# Patient Record
Sex: Female | Born: 1945 | Race: White | Hispanic: No | Marital: Married | Smoking: Never smoker
Health system: Southern US, Community
[De-identification: ages and names within clinical notes are randomized; demographics above are authoritative.]

## PROBLEM LIST (undated history)

## (undated) DIAGNOSIS — R0600 Dyspnea, unspecified: Secondary | ICD-10-CM

## (undated) DIAGNOSIS — R112 Nausea with vomiting, unspecified: Secondary | ICD-10-CM

## (undated) DIAGNOSIS — K219 Gastro-esophageal reflux disease without esophagitis: Secondary | ICD-10-CM

## (undated) DIAGNOSIS — R053 Chronic cough: Secondary | ICD-10-CM

## (undated) DIAGNOSIS — M810 Age-related osteoporosis without current pathological fracture: Secondary | ICD-10-CM

## (undated) DIAGNOSIS — Z87440 Personal history of urinary (tract) infections: Secondary | ICD-10-CM

## (undated) DIAGNOSIS — K5790 Diverticulosis of intestine, part unspecified, without perforation or abscess without bleeding: Secondary | ICD-10-CM

## (undated) DIAGNOSIS — K225 Diverticulum of esophagus, acquired: Secondary | ICD-10-CM

## (undated) DIAGNOSIS — D649 Anemia, unspecified: Secondary | ICD-10-CM

## (undated) DIAGNOSIS — J449 Chronic obstructive pulmonary disease, unspecified: Secondary | ICD-10-CM

## (undated) DIAGNOSIS — M1990 Unspecified osteoarthritis, unspecified site: Secondary | ICD-10-CM

## (undated) DIAGNOSIS — R05 Cough: Secondary | ICD-10-CM

## (undated) DIAGNOSIS — Z9889 Other specified postprocedural states: Secondary | ICD-10-CM

## (undated) DIAGNOSIS — A310 Pulmonary mycobacterial infection: Secondary | ICD-10-CM

## (undated) DIAGNOSIS — Z8614 Personal history of Methicillin resistant Staphylococcus aureus infection: Secondary | ICD-10-CM

## (undated) DIAGNOSIS — M2550 Pain in unspecified joint: Secondary | ICD-10-CM

## (undated) DIAGNOSIS — J189 Pneumonia, unspecified organism: Secondary | ICD-10-CM

## (undated) HISTORY — DX: Cough: R05

## (undated) HISTORY — DX: Pulmonary mycobacterial infection: A31.0

## (undated) HISTORY — DX: Diverticulosis of intestine, part unspecified, without perforation or abscess without bleeding: K57.90

## (undated) HISTORY — DX: Diverticulum of esophagus, acquired: K22.5

## (undated) HISTORY — PX: ANTERIOR CERVICAL DECOMP/DISCECTOMY FUSION: SHX1161

## (undated) HISTORY — PX: BREAST BIOPSY: SHX20

## (undated) HISTORY — PX: COLONOSCOPY: SHX174

## (undated) HISTORY — PX: ESOPHAGOGASTRODUODENOSCOPY: SHX1529

## (undated) HISTORY — DX: Chronic cough: R05.3

---

## 1988-12-02 DIAGNOSIS — J189 Pneumonia, unspecified organism: Secondary | ICD-10-CM

## 1988-12-02 HISTORY — DX: Pneumonia, unspecified organism: J18.9

## 1997-08-12 ENCOUNTER — Other Ambulatory Visit: Admission: RE | Admit: 1997-08-12 | Discharge: 1997-08-12 | Payer: Self-pay | Admitting: Obstetrics & Gynecology

## 1998-11-04 ENCOUNTER — Other Ambulatory Visit: Admission: RE | Admit: 1998-11-04 | Discharge: 1998-11-04 | Payer: Self-pay | Admitting: Obstetrics & Gynecology

## 2000-01-09 ENCOUNTER — Other Ambulatory Visit: Admission: RE | Admit: 2000-01-09 | Discharge: 2000-01-09 | Payer: Self-pay | Admitting: Obstetrics & Gynecology

## 2001-02-13 ENCOUNTER — Other Ambulatory Visit: Admission: RE | Admit: 2001-02-13 | Discharge: 2001-02-13 | Payer: Self-pay | Admitting: Obstetrics & Gynecology

## 2002-02-21 ENCOUNTER — Other Ambulatory Visit: Admission: RE | Admit: 2002-02-21 | Discharge: 2002-02-21 | Payer: Self-pay | Admitting: Obstetrics & Gynecology

## 2003-03-13 ENCOUNTER — Other Ambulatory Visit: Admission: RE | Admit: 2003-03-13 | Discharge: 2003-03-13 | Payer: Self-pay | Admitting: Obstetrics & Gynecology

## 2004-01-11 ENCOUNTER — Ambulatory Visit (HOSPITAL_COMMUNITY): Admission: RE | Admit: 2004-01-11 | Discharge: 2004-01-11 | Payer: Self-pay | Admitting: Family Medicine

## 2004-03-16 ENCOUNTER — Other Ambulatory Visit: Admission: RE | Admit: 2004-03-16 | Discharge: 2004-03-16 | Payer: Self-pay | Admitting: Obstetrics & Gynecology

## 2004-11-16 ENCOUNTER — Encounter: Admission: RE | Admit: 2004-11-16 | Discharge: 2004-11-16 | Payer: Self-pay | Admitting: Allergy and Immunology

## 2004-12-02 ENCOUNTER — Ambulatory Visit: Payer: Self-pay | Admitting: Emergency Medicine

## 2004-12-16 ENCOUNTER — Ambulatory Visit (HOSPITAL_COMMUNITY): Admission: RE | Admit: 2004-12-16 | Discharge: 2004-12-16 | Payer: Self-pay | Admitting: Internal Medicine

## 2004-12-19 ENCOUNTER — Ambulatory Visit: Payer: Self-pay | Admitting: Internal Medicine

## 2004-12-23 ENCOUNTER — Ambulatory Visit: Payer: Self-pay | Admitting: Emergency Medicine

## 2004-12-26 ENCOUNTER — Ambulatory Visit: Payer: Self-pay | Admitting: Internal Medicine

## 2005-01-11 ENCOUNTER — Ambulatory Visit: Payer: Self-pay | Admitting: Emergency Medicine

## 2005-02-01 ENCOUNTER — Ambulatory Visit: Payer: Self-pay | Admitting: Internal Medicine

## 2005-02-20 ENCOUNTER — Ambulatory Visit: Payer: Self-pay | Admitting: Pulmonary Disease

## 2005-04-19 ENCOUNTER — Ambulatory Visit: Payer: Self-pay | Admitting: Emergency Medicine

## 2005-04-25 ENCOUNTER — Other Ambulatory Visit: Admission: RE | Admit: 2005-04-25 | Discharge: 2005-04-25 | Payer: Self-pay | Admitting: Obstetrics & Gynecology

## 2005-05-16 ENCOUNTER — Encounter: Admission: RE | Admit: 2005-05-16 | Discharge: 2005-05-16 | Payer: Self-pay | Admitting: Obstetrics & Gynecology

## 2005-05-16 ENCOUNTER — Encounter (INDEPENDENT_AMBULATORY_CARE_PROVIDER_SITE_OTHER): Payer: Self-pay | Admitting: Specialist

## 2005-06-14 ENCOUNTER — Ambulatory Visit: Payer: Self-pay | Admitting: Emergency Medicine

## 2005-07-13 ENCOUNTER — Ambulatory Visit: Payer: Self-pay | Admitting: Emergency Medicine

## 2005-07-26 ENCOUNTER — Ambulatory Visit: Payer: Self-pay | Admitting: Emergency Medicine

## 2005-09-07 ENCOUNTER — Ambulatory Visit: Payer: Self-pay | Admitting: Emergency Medicine

## 2005-10-31 ENCOUNTER — Ambulatory Visit: Payer: Self-pay | Admitting: Emergency Medicine

## 2005-11-02 ENCOUNTER — Ambulatory Visit: Payer: Self-pay | Admitting: Cardiology

## 2005-11-21 ENCOUNTER — Ambulatory Visit: Payer: Self-pay | Admitting: Emergency Medicine

## 2006-01-19 ENCOUNTER — Ambulatory Visit: Payer: Self-pay | Admitting: Pulmonary Disease

## 2006-02-02 ENCOUNTER — Encounter: Admission: RE | Admit: 2006-02-02 | Discharge: 2006-02-02 | Payer: Self-pay | Admitting: Obstetrics & Gynecology

## 2006-02-07 ENCOUNTER — Ambulatory Visit: Payer: Self-pay | Admitting: Pulmonary Disease

## 2006-02-16 ENCOUNTER — Ambulatory Visit: Payer: Self-pay | Admitting: Emergency Medicine

## 2006-05-14 ENCOUNTER — Ambulatory Visit: Payer: Self-pay | Admitting: Endocrinology

## 2006-05-28 ENCOUNTER — Ambulatory Visit: Payer: Self-pay | Admitting: Emergency Medicine

## 2006-06-06 ENCOUNTER — Encounter: Admission: RE | Admit: 2006-06-06 | Discharge: 2006-06-06 | Payer: Self-pay | Admitting: Obstetrics & Gynecology

## 2006-06-25 ENCOUNTER — Encounter: Admission: RE | Admit: 2006-06-25 | Discharge: 2006-06-25 | Payer: Self-pay | Admitting: Obstetrics & Gynecology

## 2006-07-24 ENCOUNTER — Ambulatory Visit: Payer: Self-pay | Admitting: Emergency Medicine

## 2006-07-27 ENCOUNTER — Encounter (INDEPENDENT_AMBULATORY_CARE_PROVIDER_SITE_OTHER): Payer: Self-pay | Admitting: *Deleted

## 2006-07-27 ENCOUNTER — Encounter: Admission: RE | Admit: 2006-07-27 | Discharge: 2006-07-27 | Payer: Self-pay | Admitting: Surgery

## 2006-07-27 ENCOUNTER — Ambulatory Visit (HOSPITAL_BASED_OUTPATIENT_CLINIC_OR_DEPARTMENT_OTHER): Admission: RE | Admit: 2006-07-27 | Discharge: 2006-07-27 | Payer: Self-pay | Admitting: Surgery

## 2006-08-02 ENCOUNTER — Ambulatory Visit: Payer: Self-pay | Admitting: Internal Medicine

## 2006-09-06 ENCOUNTER — Ambulatory Visit: Payer: Self-pay | Admitting: Internal Medicine

## 2006-10-12 ENCOUNTER — Ambulatory Visit: Payer: Self-pay | Admitting: Emergency Medicine

## 2006-10-15 ENCOUNTER — Encounter: Payer: Self-pay | Admitting: Emergency Medicine

## 2006-11-02 ENCOUNTER — Ambulatory Visit: Payer: Self-pay | Admitting: Emergency Medicine

## 2006-12-06 DIAGNOSIS — A310 Pulmonary mycobacterial infection: Secondary | ICD-10-CM | POA: Insufficient documentation

## 2006-12-06 DIAGNOSIS — R059 Cough, unspecified: Secondary | ICD-10-CM | POA: Insufficient documentation

## 2006-12-06 DIAGNOSIS — N951 Menopausal and female climacteric states: Secondary | ICD-10-CM | POA: Insufficient documentation

## 2006-12-06 DIAGNOSIS — R053 Chronic cough: Secondary | ICD-10-CM | POA: Insufficient documentation

## 2006-12-06 DIAGNOSIS — M810 Age-related osteoporosis without current pathological fracture: Secondary | ICD-10-CM | POA: Insufficient documentation

## 2006-12-06 DIAGNOSIS — J309 Allergic rhinitis, unspecified: Secondary | ICD-10-CM | POA: Insufficient documentation

## 2006-12-06 DIAGNOSIS — R05 Cough: Secondary | ICD-10-CM

## 2006-12-06 DIAGNOSIS — J479 Bronchiectasis, uncomplicated: Secondary | ICD-10-CM | POA: Insufficient documentation

## 2006-12-10 ENCOUNTER — Ambulatory Visit: Payer: Self-pay | Admitting: Emergency Medicine

## 2006-12-24 ENCOUNTER — Ambulatory Visit: Payer: Self-pay | Admitting: Cardiovascular Disease

## 2007-01-09 ENCOUNTER — Ambulatory Visit: Payer: Self-pay | Admitting: Emergency Medicine

## 2007-01-09 LAB — CONVERTED CEMR LAB: IgE (Immunoglobulin E), Serum: 5.2 intl units/mL (ref 0.0–180.0)

## 2007-02-20 ENCOUNTER — Ambulatory Visit: Payer: Self-pay | Admitting: Emergency Medicine

## 2007-03-18 ENCOUNTER — Telehealth: Payer: Self-pay | Admitting: Emergency Medicine

## 2007-04-08 ENCOUNTER — Ambulatory Visit: Payer: Self-pay | Admitting: Emergency Medicine

## 2007-04-08 ENCOUNTER — Ambulatory Visit: Admission: RE | Admit: 2007-04-08 | Discharge: 2007-04-08 | Payer: Self-pay | Admitting: Emergency Medicine

## 2007-04-08 ENCOUNTER — Encounter: Payer: Self-pay | Admitting: Emergency Medicine

## 2007-04-15 ENCOUNTER — Ambulatory Visit: Payer: Self-pay | Admitting: Emergency Medicine

## 2007-04-18 ENCOUNTER — Encounter: Payer: Self-pay | Admitting: Emergency Medicine

## 2007-04-24 ENCOUNTER — Telehealth (INDEPENDENT_AMBULATORY_CARE_PROVIDER_SITE_OTHER): Payer: Self-pay | Admitting: *Deleted

## 2007-04-24 ENCOUNTER — Encounter: Payer: Self-pay | Admitting: Emergency Medicine

## 2007-04-24 ENCOUNTER — Ambulatory Visit: Payer: Self-pay | Admitting: Pulmonary Disease

## 2007-04-24 DIAGNOSIS — Z882 Allergy status to sulfonamides status: Secondary | ICD-10-CM

## 2007-04-25 ENCOUNTER — Telehealth (INDEPENDENT_AMBULATORY_CARE_PROVIDER_SITE_OTHER): Payer: Self-pay | Admitting: *Deleted

## 2007-05-02 ENCOUNTER — Telehealth (INDEPENDENT_AMBULATORY_CARE_PROVIDER_SITE_OTHER): Payer: Self-pay | Admitting: *Deleted

## 2007-06-20 ENCOUNTER — Encounter: Admission: RE | Admit: 2007-06-20 | Discharge: 2007-06-20 | Payer: Self-pay | Admitting: Obstetrics & Gynecology

## 2007-07-01 ENCOUNTER — Ambulatory Visit: Payer: Self-pay | Admitting: Emergency Medicine

## 2007-08-13 ENCOUNTER — Ambulatory Visit: Payer: Self-pay | Admitting: Emergency Medicine

## 2007-12-06 ENCOUNTER — Ambulatory Visit: Payer: Self-pay | Admitting: Emergency Medicine

## 2007-12-30 ENCOUNTER — Telehealth (INDEPENDENT_AMBULATORY_CARE_PROVIDER_SITE_OTHER): Payer: Self-pay | Admitting: *Deleted

## 2007-12-30 ENCOUNTER — Encounter: Payer: Self-pay | Admitting: Emergency Medicine

## 2008-01-02 ENCOUNTER — Telehealth (INDEPENDENT_AMBULATORY_CARE_PROVIDER_SITE_OTHER): Payer: Self-pay | Admitting: *Deleted

## 2008-01-31 ENCOUNTER — Ambulatory Visit: Payer: Self-pay | Admitting: Emergency Medicine

## 2008-03-09 ENCOUNTER — Ambulatory Visit: Payer: Self-pay | Admitting: Emergency Medicine

## 2008-04-10 ENCOUNTER — Ambulatory Visit: Payer: Self-pay | Admitting: Emergency Medicine

## 2008-06-09 ENCOUNTER — Ambulatory Visit: Payer: Self-pay | Admitting: Emergency Medicine

## 2008-06-16 ENCOUNTER — Telehealth: Payer: Self-pay | Admitting: Emergency Medicine

## 2008-09-02 ENCOUNTER — Ambulatory Visit: Payer: Self-pay | Admitting: Emergency Medicine

## 2008-10-16 ENCOUNTER — Ambulatory Visit: Payer: Self-pay | Admitting: Emergency Medicine

## 2008-12-22 ENCOUNTER — Ambulatory Visit: Payer: Self-pay | Admitting: Emergency Medicine

## 2008-12-22 LAB — CONVERTED CEMR LAB
Cholesterol, target level: 200 mg/dL
HDL goal, serum: 40 mg/dL
LDL Goal: 160 mg/dL

## 2008-12-23 LAB — CONVERTED CEMR LAB
Bilirubin Urine: NEGATIVE
Nitrite: NEGATIVE
Specific Gravity, Urine: 1.02 (ref 1.000–1.030)
Total Protein, Urine: NEGATIVE mg/dL
Urine Glucose: NEGATIVE mg/dL
Urobilinogen, UA: 0.2 (ref 0.0–1.0)
pH: 6.5 (ref 5.0–8.0)

## 2009-01-08 ENCOUNTER — Encounter: Payer: Self-pay | Admitting: Emergency Medicine

## 2009-03-08 ENCOUNTER — Ambulatory Visit: Payer: Self-pay | Admitting: Emergency Medicine

## 2009-04-01 ENCOUNTER — Ambulatory Visit: Payer: Self-pay | Admitting: Internal Medicine

## 2009-04-01 LAB — CONVERTED CEMR LAB
Basophils Absolute: 0 10*3/uL (ref 0.0–0.1)
Basophils Relative: 0.6 % (ref 0.0–3.0)
Eosinophils Absolute: 0.1 10*3/uL (ref 0.0–0.7)
Eosinophils Relative: 1 % (ref 0.0–5.0)
HCT: 41.1 % (ref 36.0–46.0)
Hemoglobin: 14 g/dL (ref 12.0–15.0)
IgA: 448 mg/dL — ABNORMAL HIGH (ref 68–378)
IgE (Immunoglobulin E), Serum: 2.4 intl units/mL (ref 0.0–180.0)
IgG (Immunoglobin G), Serum: 909 mg/dL (ref 694–1618)
IgM, Serum: 76 mg/dL (ref 60–263)
Lymphocytes Relative: 34.8 % (ref 12.0–46.0)
Lymphs Abs: 2.3 10*3/uL (ref 0.7–4.0)
MCHC: 34 g/dL (ref 30.0–36.0)
MCV: 91.2 fL (ref 78.0–100.0)
Monocytes Absolute: 0.4 10*3/uL (ref 0.1–1.0)
Monocytes Relative: 5.5 % (ref 3.0–12.0)
Neutro Abs: 3.8 10*3/uL (ref 1.4–7.7)
Neutrophils Relative %: 58.1 % (ref 43.0–77.0)
Platelets: 245 10*3/uL (ref 150.0–400.0)
RBC: 4.51 M/uL (ref 3.87–5.11)
RDW: 12.2 % (ref 11.5–14.6)
WBC: 6.6 10*3/uL (ref 4.5–10.5)

## 2009-05-05 ENCOUNTER — Ambulatory Visit: Payer: Self-pay | Admitting: Internal Medicine

## 2009-07-12 ENCOUNTER — Ambulatory Visit: Payer: Self-pay | Admitting: Emergency Medicine

## 2009-08-03 ENCOUNTER — Ambulatory Visit: Payer: Self-pay | Admitting: Internal Medicine

## 2009-09-28 ENCOUNTER — Telehealth (INDEPENDENT_AMBULATORY_CARE_PROVIDER_SITE_OTHER): Payer: Self-pay | Admitting: *Deleted

## 2009-10-01 ENCOUNTER — Ambulatory Visit: Payer: Self-pay | Admitting: Emergency Medicine

## 2009-11-09 ENCOUNTER — Ambulatory Visit: Payer: Self-pay | Admitting: Internal Medicine

## 2010-01-24 ENCOUNTER — Ambulatory Visit: Payer: Self-pay | Admitting: Emergency Medicine

## 2010-02-07 ENCOUNTER — Ambulatory Visit: Payer: Self-pay | Admitting: Internal Medicine

## 2010-02-11 ENCOUNTER — Encounter: Payer: Self-pay | Admitting: Internal Medicine

## 2010-04-20 ENCOUNTER — Ambulatory Visit: Payer: Self-pay | Admitting: Internal Medicine

## 2010-04-21 ENCOUNTER — Ambulatory Visit: Payer: Self-pay | Admitting: Internal Medicine

## 2010-04-27 ENCOUNTER — Ambulatory Visit: Payer: Self-pay | Admitting: Internal Medicine

## 2010-04-28 ENCOUNTER — Ambulatory Visit: Payer: Self-pay | Admitting: Internal Medicine

## 2010-05-01 ENCOUNTER — Ambulatory Visit: Payer: Self-pay | Admitting: Internal Medicine

## 2010-05-02 ENCOUNTER — Ambulatory Visit: Payer: Self-pay | Admitting: Internal Medicine

## 2010-05-03 NOTE — Progress Notes (Signed)
Summary: talk to nurse  Phone Note Call from Patient Call back at Home Phone [phone]   Caller: Patient Call For: byrum Reason for Call: Talk to Nurse Summary of Call: Pt c/o of brown and greenish mucous x3 days, wants to be seen offered her TP she refused, pls advise.//[facility] Initial call taken by: Darletta Moll,  September 28, 2009 10:05 AM  Follow-up for Phone Call        called and spoke with pt and she stated that she has been sick x 3 days with cough with brown/green sputum   low grade fever on friday and saturday--she has violent couging spells at times---she is on the maintenance abx every other month with azithromycin and doxy---she started the azithromycin early--she is on her 3rd day today of this.  has 2 days left.  please advise.  thanks     Additional Follow-up for Phone Call Additional follow up Details #1::        Spoke to pt - has had URI symptoms. trying symptomatic relief. Will add Avelox, d/c the azithro. She will do NSWs, benadryl, add chlorphenerimine, told her we would try to fit her in next few days.  Additional Follow-up by: Leslye Peer MD,  September 28, 2009 2:03 PM    Additional Follow-up for Phone Call Additional follow up Details #2::    There is an opening on Fri 10/01/2009 @ 4:10pm or pt can come in tomorrow at 1:30pm. Okay to double book.Michel Bickers [facility]  September 28, 2009 3:11 PM  pt scheduled to see RB on Friday at 4:10pm.  Arman Filter LPN  September 28, 2009 3:16 PM   New/Updated Medications: AVELOX 400 MG TABS (MOXIFLOXACIN HCL) 1 by mouth once daily Prescriptions: AVELOX 400 MG TABS (MOXIFLOXACIN HCL) 1 by mouth once daily  #7 x 0   Entered and Authorized by:   Leslye Peer MD   Signed by:   Leslye Peer MD on 09/28/2009   Method used:   Electronically to        [facility]  [address] [address] [id number] (retail)       [address]       [place], [place]  [address]       Ph: [phone] or [phone]       Fax: [phone]   RxID:    [id number]

## 2010-05-03 NOTE — Miscellaneous (Signed)
Summary: Occupational psychologist   Imported By: Lester Haigler 05/12/2009 09:11:10  _____________________________________________________________________  External Attachment:    Type:   Image     Comment:   External Document

## 2010-05-03 NOTE — Assessment & Plan Note (Signed)
Summary: ALT- OK PER KATIE ///KP   Vital Signs:  Patient profile:   65 year old female Height:      65 inches Weight:      107.25 pounds BMI:     17.91 O2 Sat:      93 % on Room air Pulse rate:   89 / minute BP sitting:   108 / 64  (right arm) Cuff size:   regular  Vitals Entered By: Reynaldo Minium CMA (May 05, 2009 2:04 PM)  O2 Flow:  Room air CC: Allergy Testing.   Comments Medications reviewed with patient Phone number verified with patient. Reynaldo Minium CMA  May 05, 2009 2:04 PM    Copy to:  Dr. Levy Pupa Primary Provider/Referring Provider:  Dr. Lubertha South  CC:  Allergy Testing.  Marland Kitchen  History of Present Illness: CC:  Allergy consult-drainage and cough.. History of Present Illness: April 01, 2009- New referral for this 2 yoF at kind request of Dr Delton Coombes, asking allergy evaluation. She is treated for hx of bronchiectasis. She complains of recurrent sinus infections since her teens. Skin tested 2-3 years ago, "positive for dust". Bothersome postnasal drip is worst in Spring and with dust exposure.As she has gotten older, she has been more aware of a seasonal pattern, but the drainage is perennial. She denies hx of asthma, or family hx of sinopulmonary disease. Strong smells and perfumes make her nose run. There are no animal exposures or problems with foods, skin rashes, latex, contrast dye or aspirin.  May 05, 2009- Bronchiectasis, allergy evaluation Feels the same with no acute respiratory issue. She is on Keflex for a UTI. She is aware of sneeze and nasal drip without dramatic change off antihistamine for skin testing. RAST- neg IgA elevated 448 (68-378). IgG and IgM are WNL. IgE wnl- 2.4 ST- Positive common inhalants- grass/ weed/ tree/ dust-mite.  Current Medications (verified): 1)  One-A-Day Extras Antioxidant   Caps (Multiple Vitamins-Minerals) .... Take 1 Tablet By Mouth Once A Day 2)  Aspirin 81 Mg  Tbec (Aspirin) .... Take 1 Tablet By Mouth  Once A Day 3)  Fosamax 10 Mg Tabs (Alendronate Sodium) .Marland Kitchen.. 1 By Mouth Every Wk 4)  Prempro 0.3-1.5 Mg Tabs (Conj Estrog-Medroxyprogest Ace) .... Take 1 Tablet By Mouth Once A Day 5)  Benadryl 25 Mg Caps (Diphenhydramine Hcl) .... Two Times A Day 6)  Citracal Plus  Tabs (Multiple Minerals-Vitamins) .... Two Times A Day 7)  Doxycycline Hyclate 100 Mg Caps (Doxycycline Hyclate) .Marland Kitchen.. 1 By Mouth Two Times A Day For The First 14 Days of Alternating Months (Alternated With Azithromycin) 8)  Proventil Hfa 108 (90 Base) Mcg/act Aers (Albuterol Sulfate) .... 2 Puffs Every 4 Hours As Needed 9)  Azithromycin 250 Mg Tabs (Azithromycin) .Marland Kitchen.. 1 By Mouth Once Daily For The First 5 Days of Alternating Months. (Alternated With Doxycycline). 10)  Pro-B Supplement .... Once Daily 11)  Cephalexin 500 Mg Caps (Cephalexin) .... Three Times A Day  Allergies (verified): 1)  Bactrim  Past History:  Past Surgical History: Last updated: 04/01/2009 neck surgery 1997 left breast biopsy  Family History: Last updated: 04/01/2009 allergies-mother heart disease-mother, father  Social History: Last updated: 04/01/2009 Patient never smoked.  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.  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    Physical Exam  Additional Exam:  General: A/Ox3; pleasant and cooperative, NAD, trim SKIN: no rash, lesions NODES: no  lymphadenopathy HEENT: [place]/AT, EOM- WNL, Conjuctivae- clear, PERRLA, TM-WNL, Nose- clear, Throat- clear and wnl, [place]  II NECK: Supple w/ fair ROM, JVD- none, normal carotid impulses w/o bruits Thyroid-  CHEST: trace lower zone crackles, unlabored HEART: RRR, no m/g/r heard ABDOMEN: Soft and nl; EAV:WUJW, nl pulses, no edema  NEURO: Grossly intact to observation      Impression & Recommendations:  Problem # 1:  ALLERGIC RHINITIS (ICD-477.9)  Post  nasal drip is a significant complaint and this may correspond best to the atopy indicated by skin test pattern. She had been on several steroid nasal sprays including nasonex. She lives in [place] and plans to be out of country in March. We discussed risks, technique and management of allergy vaccine as an option that we can work on later if needed. I will let her tryAstepro sample first. Her updated medication list for this problem includes:    Benadryl 25 Mg Caps (Diphenhydramine hcl) .Marland Kitchen..Marland Kitchen Two times a day  Problem # 2:  BRONCHIECTASIS (ICD-494.0) Not an acute source of complaint at this visit. Followed by Dr Delton Coombes.  Medications Added to Medication List This Visit: 1)  Benadryl 25 Mg Caps (Diphenhydramine hcl) .... Two times a day 2)  Pro-b Supplement  .... Once daily 3)  Cephalexin 500 Mg Caps (Cephalexin) .... Three times a day  Other Orders: Est. Patient Level II (11914) Allergy Puncture Test (78295) Allergy I.D Test (62130)  Patient Instructions: 1)  Please schedule a follow-up appointment in 3 months. 2)  We can set up a way tpo get allergy shots if you decide that is something you want to do. 3)  Sample Astepro nasal antihistamine: 1-2 sprays each nostril up to twice daily if needed.   Immunization History:  Influenza Immunization History:    Influenza:  historical (01/01/2009)

## 2010-05-03 NOTE — Assessment & Plan Note (Signed)
Summary: rov 3 months///kp   Copy to:  Dr. Levy Pupa Primary Provider/Referring Provider:  Dr. Lubertha South  CC:  3 month follow up visit-allergies have been better since last visit-"better time of the year"..  History of Present Illness:  CC:  Allergy consult-drainage and cough.. History of Present Illness: April 01, 2009- New referral for this 64 yoF at kind request of Dr Delton Coombes, asking allergy evaluation. She is treated for hx of bronchiectasis. She complains of recurrent sinus infections since her teens. Skin tested 2-3 years ago, "positive for dust". Bothersome postnasal drip is worst in Spring and with dust exposure.As she has gotten older, she has been more aware of a seasonal pattern, but the drainage is perennial.  May 05, 2009- Bronchiectasis, allergy evaluation Feels the same with no acute respiratory issue. She is on Keflex for a UTI. She is aware of sneeze and nasal drip without dramatic change off antihistamine for skin testing. RAST- neg IgA elevated 448 (68-378). IgG and IgM are WNL. IgE wnl- 2.4 ST- Positive common inhalants- grass/ weed/ tree/ dust-mite.  November 09, 2009- Bronchiectasis, allergy evaluation She got Dr Delton Coombes to give her an antibiotic July, 1, then he had her go  back to alternating zithromax/ doxycycline to suppress bronchitis. She has coughed through the summer/ clear to brown. Always has annoying postnasal drip and variable nasal congestion. February 07, 2010- Bronchiectasis, allergy evaluation Nurse-CC: 3 month follow up visit-allergies have been better since last visit-"better time of the year". Fall is better than Spring. Daliresp kept her awake- dc'd. Trying rotating doxy/ azithromycin. Doxy best, still coughs. She feels ready to try allergy shots but needs to wait till she can travel routinely. Dr Gerda Diss can give maintenance but not buildup shots..     Preventive Screening-Counseling & Management  Alcohol-Tobacco     Smoking Status:  never  Current Medications (verified): 1)  One-A-Day Extras Antioxidant   Caps (Multiple Vitamins-Minerals) .... Take 1 Tablet By Mouth Once A Day 2)  Aspirin 81 Mg  Tbec (Aspirin) .... Take 1 Tablet By Mouth Once A Day 3)  Fosamax 10 Mg Tabs (Alendronate Sodium) .Marland Kitchen.. 1 By Mouth Every Wk 4)  Prempro 0.3-1.5 Mg Tabs (Conj Estrog-Medroxyprogest Ace) .... Take 1 Tablet By Mouth Once A Day 5)  Benadryl 25 Mg Caps (Diphenhydramine Hcl) .... Take As Directed As Needed 6)  Citracal Plus  Tabs (Multiple Minerals-Vitamins) .... Take 2 Tablet By Mouth Once A Day 7)  Doxycycline Hyclate 100 Mg Caps (Doxycycline Hyclate) .Marland Kitchen.. 1 By Mouth Two Times A Day For The First 10 Days of Alternating Months (Alternated With Azithromycin) 8)  Azithromycin 250 Mg Tabs (Azithromycin) .Marland Kitchen.. 1 By Mouth Once Daily For The First 10 Days of Alternating Months. (Alternated With Doxycycline). 9)  Pro-B Supplement .... Once Daily 10)  Cephalexin 500 Mg Caps (Cephalexin) .... Take 1 Tab By Mouth As Needed  Allergies: 1)  ! 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   Risk Factors: Smoking Status: never (  02/07/2010)  Review of Systems      See HPI       The patient complains of nasal congestion/difficulty breathing through nose.  The patient denies productive cough,  non-productive cough, coughing up blood, chest pain, irregular heartbeats, acid heartburn, indigestion, loss of appetite, weight change, abdominal pain, difficulty swallowing, sore throat, tooth/dental problems, headaches, and sneezing.    Vital Signs:  Patient profile:   65 year old female Height:      65 inches Weight:      107.50 pounds BMI:     17.95 O2 Sat:      96 % on Room air Pulse rate:   83 / minute BP sitting:   114 / 60  (left arm) Cuff size:   regular  Vitals Entered By: Reynaldo Minium CMA (February 07, 2010 2:11 PM)  O2 Flow:  Room air CC: 3 month follow up visit-allergies have been better since last visit-"better time of the year".   Physical Exam  Additional Exam:  General: A/Ox3; pleasant and cooperative, NAD, trim SKIN: no rash, lesions NODES: no lymphadenopathy HEENT: [place]/AT, EOM- WNL, Conjuctivae- clear, PERRLA, TM-WNL, Nose- very pale with abundant clear mucus, Throat- clear and wnl, [place]  II NECK: Supple w/ fair ROM, JVD- none, normal carotid impulses w/o bruits Thyroid-  CHEST: coarse sounds, but no wheeze and unlabored HEART: RRR, no m/g/r heard ABDOMEN: Soft and nl; UUV:OZDG, nl pulses, no edema  NEURO: Grossly intact to observation      Impression & Recommendations:  Problem # 1:  COUGH, CHRONIC (ICD-786.2)  We agreed to let her try allergy vaccine, but starting in January. Once through the build-up, she can get her maintenance shots at Dr Cathlyn Parsons.   Problem # 2:  ALLERGIC RHINITIS (ICD-477.9)  Benadryl every night. Says otc antihistamines don't help much for rhinorhea. We will try ipratropium nasal spray. She continues nasal wash.  Her updated medication list for this problem includes:    Benadryl 25 Mg Caps (Diphenhydramine hcl) .Marland Kitchen... Take as directed as needed    Ipratropium Bromide 0.03 % Soln (Ipratropium bromide) .Marland Kitchen... 1-2 sprays each nostril up to three times a day as needed  Medications Added to Medication List This Visit: 1)   Ipratropium Bromide 0.03 % Soln (Ipratropium bromide) .Marland Kitchen.. 1-2 sprays each nostril up to three times a day as needed 2)  Allergy Vaccine Start New Jan, 2012.  .... To get at dr Cathlyn Parsons  Other Orders: Est. Patient Level III (64403)  Patient Instructions: 1)  Please schedule a follow-up appointment in 4 months. 2)  We will set up the allergy lab to start allergy vaccine for you in January. Please call to help [place] get communication started if you don't hear directly from the allergy lab after New Year's. Once past build-up, anticipate getting at Dr Cathlyn Parsons.  3)  Try script ipratropium nasal spray for watery nose. Prescriptions: IPRATROPIUM BROMIDE 0.03 % SOLN (IPRATROPIUM BROMIDE) 1-2 sprays each nostril up to three times a day as needed  #1 x prn   Entered and Authorized by:   Waymon Budge MD   Signed by:   Waymon Budge MD on 02/07/2010   Method used:   Print then Give to Patient   RxID:   [id number]

## 2010-05-03 NOTE — Assessment & Plan Note (Signed)
Summary: bronchiectasis, cough   Visit Type:  Follow-up Copy to:  Dr. Levy Pupa Primary Provider/Referring Provider:  Dr. Lubertha South  CC:  bronochiectasis.  Cough.  The patient says her cough is slightly better. Productive with yellow to brown mucus.. Currently still on Avelox and Chest tightness is better.Marland Kitchen  History of Present Illness: Ms. Hollopeter is a 65 year old woman whom I have followed  for chronic cough.  She has bronchiectasis and severe vasomotor rhinitis.  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 Evaluated 2011 by Dr Maple Hudson for allergies - shows multiple sensitivities that are present year round.   ROV 07/12/09 -- returns for f/u. Tells me that her PND has actually improved since last visit. Her regimen is benadryl 25mg  two times a day, [place] two times a day, flutter valve two times a day - sometimes productive but often not. Also on rotating doxy + azithromycin. No flares of bronchiectasis since last visit. She is contemplating starting allergy shots, hopefully can get them from her PCP.  ROV 10/01/09 -- f/u bronchiectasis, cough. Flare that started about a week ago. Began treatment with Avelox and set up this follow up. Was exposed to husband who had a URI. Changed her benadryl to Claritin D. She has not started allergy shots yet. Starting to feel better, not back to baseline. never started Singulair.    Current Medications (verified): 1)  One-A-Day Extras Antioxidant   Caps (Multiple Vitamins-Minerals) .... Take 1 Tablet By Mouth Once A Day 2)  Aspirin 81 Mg  Tbec (Aspirin) .... Take 1 Tablet By Mouth Once A Day 3)  Fosamax 10 Mg Tabs (Alendronate Sodium) .Marland Kitchen.. 1 By Mouth Every Wk 4)  Prempro 0.3-1.5 Mg Tabs (Conj Estrog-Medroxyprogest Ace) .... Take 1 Tablet By Mouth Once A Day 5)  Claritin-D 24 Hour 10-240 Mg Xr24h-Tab (Loratadine-Pseudoephedrine) .Marland Kitchen.. 1 By Mouth Daily 6)  Citracal Plus  Tabs (Multiple Minerals-Vitamins) .... Take 2 Tablet By Mouth Once A Day 7)   Doxycycline Hyclate 100 Mg Caps (Doxycycline Hyclate) .Marland Kitchen.. 1 By Mouth Two Times A Day For The First 14 Days of Alternating Months (Alternated With Azithromycin) 8)  Azithromycin 250 Mg Tabs (Azithromycin) .Marland Kitchen.. 1 By Mouth Once Daily For The First 5 Days of Alternating Months. (Alternated With Doxycycline). 9)  Pro-B Supplement .... Once Daily 10)  Cephalexin 500 Mg Caps (Cephalexin) .... Take 1 Tab By Mouth At Bedtime 11)  Singulair 10 Mg Tabs (Montelukast Sodium) .Marland Kitchen.. 1 Daily 12)  Epipen 0.3 Mg/0.52ml Devi (Epinephrine) .... For Severe Allergic Reaction 13)  Avelox 400 Mg Tabs (Moxifloxacin Hcl) .Marland Kitchen.. 1 By Mouth Once Daily  Allergies (verified): 1)  Bactrim  Vital Signs:  Patient profile:   65 year old female Height:      65 inches (165.10 cm) Weight:      105 pounds (47.73 kg) BMI:     17.54 O2 Sat:      97 % on Room air Temp:     98.2 degrees F (36.78 degrees C) oral Pulse rate:   107 / minute BP sitting:   90 / 40  (right arm) Cuff size:   regular  Vitals Entered By: Michel Bickers CMA (October 01, 2009 3:33 PM)  O2 Sat at Rest %:  97 O2 Flow:  Room air CC: bronochiectasis.  Cough.  The patient says her cough is slightly better. Productive with yellow to brown mucus.. Currently still on Avelox, Chest tightness is better. Comments Medications reviewed. Daytime phone verified. Michel Bickers CMA  October 01, 2009 3:34 PM   Physical Exam  General:  thin female in no acute distress Head:  normocephalic and atraumatic Eyes:  conjunctiva and sclera clear Nose:  no lesions or excoriations seen, some congestion Mouth:  no mucosal abnormalities Neck:  no masses, thyromegaly, or abnormal cervical nodes Lungs:  clear bilaterally Heart:  regular rate and rhythm, S1, S2 without murmurs, rubs, gallops, or clicks Abdomen:  not examined Extremities:  no clubbing, cyanosis, edema, or deformity noted Psych:  alert and cooperative; normal mood and affect; normal attention span and  concentration   Impression & Recommendations:  Problem # 1:  BRONCHIECTASIS (ICD-494.0) Flare due to URI. Improving with Avelox. No wheeze on exam.  - no steroids at this time - finish Avelox  Problem # 2:  COUGH, CHRONIC (ICD-786.2)  - flutter valve   Orders: Est. Patient Level IV (16109)  Medications Added to Medication List This Visit: 1)  Claritin-d 24 Hour 10-240 Mg Xr24h-tab (Loratadine-pseudoephedrine) .Marland Kitchen.. 1 by mouth daily  Patient Instructions: 1)  Please finish your Avelox prescription.  2)  Restart cycling your antibiotic in August 3)  Finish your current Claritin D and then go back to benadryl.  4)  Follow up with Dr Delton Coombes in 3 months or as needed .

## 2010-05-03 NOTE — Assessment & Plan Note (Signed)
Summary: 3 months/apc   Copy to:  Dr. Levy Pupa Primary Provider/Referring Provider:  Dr. Lubertha South  CC:  3 mointh follow up visit-allergies; husband recently had a cold; pt is now suffering from cough-producive-clear to brown in color;denies fever/chills..  History of Present Illness:  CC:  Allergy consult-drainage and cough.. History of Present Illness: April 01, 2009- New referral for this 65 yoF at kind request of Dr Delton Coombes, asking allergy evaluation. She is treated for hx of bronchiectasis. She complains of recurrent sinus infections since her teens. Skin tested 2-3 years ago, "positive for dust". Bothersome postnasal drip is worst in Spring and with dust exposure.As she has gotten older, she has been more aware of a seasonal pattern, but the drainage is perennial. She denies hx of asthma, or family hx of sinopulmonary disease. Strong smells and perfumes make her nose run. There are no animal exposures or problems with foods, skin rashes, latex, contrast dye or aspirin.  May 05, 2009- Bronchiectasis, allergy evaluation Feels the same with no acute respiratory issue. She is on Keflex for a UTI. She is aware of sneeze and nasal drip without dramatic change off antihistamine for skin testing. RAST- neg IgA elevated 448 (68-378). IgG and IgM are WNL. IgE wnl- 2.4 ST- Positive common inhalants- grass/ weed/ tree/ dust-mite.  November 09, 2009- Bronchiectasis, allergy evaluation She got Dr Delton Coombes to give her an antibiotic July, 1, then he had her go  back to alternating zithromax/ doxycycline to suppress bronchitis. She has coughed through the summer/ clear to brown. Always has annoying postnasal drip and variable nasal congestion.   Preventive Screening-Counseling & Management  Alcohol-Tobacco     Smoking Status: never  Current Medications (verified): 1)  One-A-Day Extras Antioxidant   Caps (Multiple Vitamins-Minerals) .... Take 1 Tablet By Mouth Once A Day 2)  Aspirin 81  Mg  Tbec (Aspirin) .... Take 1 Tablet By Mouth Once A Day 3)  Fosamax 10 Mg Tabs (Alendronate Sodium) .Marland Kitchen.. 1 By Mouth Every Wk 4)  Prempro 0.3-1.5 Mg Tabs (Conj Estrog-Medroxyprogest Ace) .... Take 1 Tablet By Mouth Once A Day 5)  Benadryl 25 Mg Caps (Diphenhydramine Hcl) .... Take As Directed As Needed 6)  Citracal Plus  Tabs (Multiple Minerals-Vitamins) .... Take 2 Tablet By Mouth Once A Day 7)  Doxycycline Hyclate 100 Mg Caps (Doxycycline Hyclate) .Marland Kitchen.. 1 By Mouth Two Times A Day For The First 14 Days of Alternating Months (Alternated With Azithromycin) 8)  Azithromycin 250 Mg Tabs (Azithromycin) .Marland Kitchen.. 1 By Mouth Once Daily For The First 5 Days of Alternating Months. (Alternated With Doxycycline). 9)  Pro-B Supplement .... Once Daily 10)  Cephalexin 500 Mg Caps (Cephalexin) .... 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.  Risk Factors: Smoking Status: never (11/09/2009)  Review of Systems      See HPI       The patient complains of shortness of breath with activity, productive cough, and nasal congestion/difficulty breathing through nose.  The patient denies shortness of  breath at rest, non-productive cough, coughing up blood, chest pain, irregular heartbeats, acid heartburn, indigestion, loss of appetite, weight change, abdominal pain, difficulty  swallowing, sore throat, tooth/dental problems, headaches, and sneezing.    Vital Signs:  Patient profile:   65 year old female Height:      65 inches Weight:      108 pounds BMI:     18.04 O2 Sat:      94 % on Room air Pulse rate:   93 / minute BP sitting:   122 / 69  (left arm) Cuff size:   regular  Vitals Entered By: Reynaldo Minium CMA (November 09, 2009 2:19 PM)  O2 Flow:  Room air CC: 3 mointh follow up visit-allergies; husband recently had a cold; pt is now suffering from cough-producive-clear to brown in color;denies fever/chills.   Physical Exam  Additional Exam:  General: A/Ox3; pleasant and cooperative, NAD, trim SKIN: no rash, lesions NODES: no lymphadenopathy HEENT: [place]/AT, EOM- WNL, Conjuctivae- clear, PERRLA, TM-WNL, Nose- very pale with abundant clear mucus, Throat- clear and wnl, Mallampatti  II NECK: Supple w/ fair ROM, JVD- none, normal carotid impulses w/o bruits Thyroid-  CHEST: coarse sounds, but no wheeze and unlabored HEART: RRR, no m/g/r heard ABDOMEN: Soft and nl; ZOX:WRUE, nl pulses, no edema  NEURO: Grossly intact to observation      Impression & Recommendations:  Problem # 1:  ALLERGIC RHINITIS (ICD-477.9)  Exam is consistent with definite allergic rhinitis. She had been concerned about ability to go to [place] to visit her children I told her we could work around that if vaccine  looked like a good idea otherwise. We can come back to the issue. Her updated medication list for this problem includes:    Benadryl 25 Mg Caps (Diphenhydramine hcl) .Marland Kitchen... Take as directed as needed  Problem # 2:  BRONCHIECTASIS (ICD-494.0) Her biggest concern has been with recurrent bronchitis flares. We discussed Daliresp for this indication, and discussed side effects including depression, weight loss and interaction with macrolides/ erythromycin ?+ zithromax. We will let her try sample. If this doesn't make the difference then she would be interested in considering  allergy vaccine.  Medications Added to Medication List This Visit: 1)  Benadryl 25 Mg Caps (Diphenhydramine hcl) .... Take as directed as needed 2)  Daliresp 500 Mcg  .Marland Kitchen.. 1 daily  Other Orders: Est. Patient Level IV (45409)  Patient Instructions: 1)  Please schedule a follow-up appointment in 3 months. 2)  Watch how the Fall pollen season seems to treat you, as we look for best options. Call if I can help. 3)  Try sample/ script Daliresp for recurrent bronchitis: 1 daily Prescriptions: DALIRESP 500 MCG 1 daily  #30 x 3   Entered and Authorized by:   Waymon Budge MD   Signed by:   Waymon Budge MD on 11/09/2009   Method used:   Print then Give to Patient   RxID:   [id number]

## 2010-05-03 NOTE — Assessment & Plan Note (Signed)
Summary: bronchiectasis, cough   Visit Type:  Follow-up Copy to:  Dr. Levy Pupa Primary Provider/Referring Provider:  Dr. Lubertha South  CC:  3 month follow up , pt finished doxycycline on 10/23 , and prod cough clear.  History of Present Illness: Mary [name] is a 65 year old woman whom I have followed  for chronic cough.  She has bronchiectasis and severe vasomotor rhinitis.  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 Evaluated 2011 by Dr Maple Hudson for allergies - shows multiple sensitivities that are present year round.   ROV 07/12/09 -- returns for f/u. Tells me that her PND has actually improved since last visit. Her regimen is benadryl 25mg  two times a day, NSW two times a day, flutter valve two times a day - sometimes productive but often not. Also on rotating doxy + azithromycin. No flares of bronchiectasis since last visit. She is contemplating starting allergy shots, hopefully can get them from her PCP.  ROV 10/01/09 -- f/u bronchiectasis, cough. Flare that started about a week ago. Began treatment with Avelox and set up this follow up. Was exposed to husband who had a URI. Changed her benadryl to Claritin D. She has not started allergy shots yet. Starting to feel better, not back to baseline. never started Singulair.  ROV 01/24/10 -- Hx chronic cough, bronchiectasis. Tried daliresp, couldn't tolerate due to insomnia. Still with clear drainage, never responded to treatment with nasal steroid. On benadryl qhs. Taking alternating doxy and azithro monthly. Considering starting allergy shots - discussing w Dr Maple Hudson.   Preventive Screening-Counseling & Management  Alcohol-Tobacco     Smoking Status: never  Current Medications (verified): 1)  One-A-Day Extras Antioxidant   Caps (Multiple Vitamins-Minerals) .... Take 1 Tablet By Mouth Once A Day 2)  Aspirin 81 Mg  Tbec (Aspirin) .... Take 1 Tablet By Mouth Once A Day 3)  Fosamax 10 Mg Tabs (Alendronate Sodium) .Marland Kitchen.. 1 By Mouth  Every Wk 4)  Prempro 0.3-1.5 Mg Tabs (Conj Estrog-Medroxyprogest Ace) .... Take 1 Tablet By Mouth Once A Day 5)  Benadryl 25 Mg Caps (Diphenhydramine Hcl) .... Take As Directed As Needed 6)  Citracal Plus  Tabs (Multiple Minerals-Vitamins) .... Take 2 Tablet By Mouth Once A Day 7)  Doxycycline Hyclate 100 Mg Caps (Doxycycline Hyclate) .Marland Kitchen.. 1 By Mouth Two Times A Day For The First 14 Days of Alternating Months (Alternated With Azithromycin) 8)  Azithromycin 250 Mg Tabs (Azithromycin) .Marland Kitchen.. 1 By Mouth Once Daily For The First 5 Days of Alternating Months. (Alternated With Doxycycline). 9)  Pro-B Supplement .... Once Daily 10)  Cephalexin 500 Mg Caps (Cephalexin) .... Take 1 Tab By Mouth As Needed 11)  Daliresp 500 Mcg .Marland KitchenMarland Kitchen. 1 Daily  Allergies (verified): 1)  Bactrim  Social History: Patient never smoked.  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   Vital Signs:  Patient profile:   65 year old female Height:      65 inches Weight:      106 pounds BMI:     17.70 O2 Sat:      96 % on Room air Temp:     97.8 degrees F oral Pulse rate:   80 / minute BP sitting:   104 / 64  (left arm)  Vitals Entered By: Renold Genta RCP, LPN (January 24, 2010 2:39 PM)  O2 Flow:  Room air CC: 3 month follow up , pt finished doxycycline on  10/23 , prod cough clear Is Patient Diabetic? No Comments Medications reviewed with patient Renold Genta RCP, LPN  January 24, 2010 2:41 PM    Physical Exam  General:  thin female in no acute distress Head:  normocephalic and atraumatic Eyes:  conjunctiva and sclera clear Nose:  no lesions or excoriations seen, some congestion Mouth:  no mucosal abnormalities Neck:  no masses, thyromegaly, or abnormal cervical nodes Lungs:  clear bilaterally Heart:  regular rate and rhythm, S1, S2 without murmurs, rubs, gallops, or clicks Abdomen:  not examined Extremities:  no  clubbing, cyanosis, edema, or deformity noted Psych:  alert and cooperative; normal mood and affect; normal attention span and concentration   Impression & Recommendations:  Problem # 1:  COUGH, CHRONIC (ICD-786.2)  Orders: Est. Patient Level IV (56433)  Problem # 2:  BRONCHIECTASIS (ICD-494.0)  Problem # 3:  ALLERGIC RHINITIS (ICD-477.9)  Her updated medication list for this problem includes:    Benadryl 25 Mg Caps (Diphenhydramine hcl) .Marland Kitchen... Take as directed as needed  Medications Added to Medication List This Visit: 1)  Doxycycline Hyclate 100 Mg Caps (Doxycycline hyclate) .Marland Kitchen.. 1 by mouth two times a day for the first 10 days of alternating months (alternated with azithromycin) 2)  Azithromycin 250 Mg Tabs (Azithromycin) .Marland Kitchen.. 1 by mouth once daily for the first 10 days of alternating months. (alternated with doxycycline). 3)  Cephalexin 500 Mg Caps (Cephalexin) .... Take 1 tab by mouth as needed  Patient Instructions: 1)  Continue your nasal washes and benadryl  2)  Continue your alternating antibiotics every month 3)  Consider allergy shots with Dr Maple Hudson 4)  Call our office if your cough or breathing change in any way.  5)  Follow up with Dr Delton Coombes in 6 months or as needed 6)  Get the flu shot as planned.  Prescriptions: AZITHROMYCIN 250 MG TABS (AZITHROMYCIN) 1 by mouth once daily for the first 10 days of alternating months. (Alternated with doxycycline).  #10 x 5   Entered and Authorized by:   Leslye Peer MD   Signed by:   Leslye Peer MD on 01/24/2010   Method used:   Electronically to        [facility]  [address] [id number] (retail)       [address]       [place], [place]  [address]       Ph: [phone] or [phone]       Fax: [phone]   RxID:   [id number] DOXYCYCLINE HYCLATE 100 MG CAPS (DOXYCYCLINE HYCLATE) 1 by mouth two times a day for the first 10 days of alternating months (alternated with azithromycin)  #20 x 5   Entered and  Authorized by:   Leslye Peer MD   Signed by:   Leslye Peer MD on 01/24/2010   Method used:   Electronically to        [facility]  [address] [id number] (retail)       [address]       [place], [place]  [address]       Ph: [phone] or [phone]       Fax: [phone]   RxID:   [id number]

## 2010-05-03 NOTE — Assessment & Plan Note (Signed)
Summary: cough, bronchiectasis   Visit Type:  Follow-up Copy to:  Dr. Levy Pupa Primary Provider/Referring Provider:  Dr. Lubertha South  CC:  Pt here for follow up. Pt c/o still with productive cough with thick clear mucus but much better.Marland Kitchen  History of Present Illness: Mary [name] is a 65 year old woman whom I have followed  for chronic cough.  She has bronchiectasis and severe vasomotor rhinitis.  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 Evaluated 2011 by Dr Maple Hudson for allergies - shows multiple sensitivities that are present year round.   ROV 07/12/09 -- returns for f/u. Tells me that her PND has actually improved since last visit. Her regimen is benadryl 25mg  two times a day, NSW two times a day, flutter valve two times a day - sometimes productive but often not. Also on rotating doxy + azithromycin. No flares of bronchiectasis since last visit. She is contemplating starting allergy shots, hopefully can get them from her PCP.   Current Medications (verified): 1)  One-A-Day Extras Antioxidant   Caps (Multiple Vitamins-Minerals) .... Take 1 Tablet By Mouth Once A Day 2)  Aspirin 81 Mg  Tbec (Aspirin) .... Take 1 Tablet By Mouth Once A Day 3)  Fosamax 10 Mg Tabs (Alendronate Sodium) .Marland Kitchen.. 1 By Mouth Every Wk 4)  Prempro 0.3-1.5 Mg Tabs (Conj Estrog-Medroxyprogest Ace) .... Take 1 Tablet By Mouth Once A Day 5)  Benadryl 25 Mg Caps (Diphenhydramine Hcl) .... Two Times A Day 6)  Citracal Plus  Tabs (Multiple Minerals-Vitamins) .... Take 1 Tablet By Mouth Once A Day 7)  Doxycycline Hyclate 100 Mg Caps (Doxycycline Hyclate) .Marland Kitchen.. 1 By Mouth Two Times A Day For The First 14 Days of Alternating Months (Alternated With Azithromycin) 8)  Proventil Hfa 108 (90 Base) Mcg/act Aers (Albuterol Sulfate) .... 2 Puffs Every 4 Hours As Needed 9)  Azithromycin 250 Mg Tabs (Azithromycin) .Marland Kitchen.. 1 By Mouth Once Daily For The First 5 Days of Alternating Months. (Alternated With Doxycycline). 10)  Pro-B  Supplement .... Once Daily 11)  Cephalexin 500 Mg Caps (Cephalexin) .... Take 1 Tab By Mouth At Bedtime  Allergies (verified): 1)  Bactrim  Vital Signs:  Patient profile:   65 year old female Height:      65 inches Weight:      107 pounds O2 Sat:      96 % on Room air Temp:     98.3 degrees F oral Pulse rate:   79 / minute BP sitting:   90 / 60  (left arm) Cuff size:   regular  Vitals Entered By: Mary [name] CMA (July 12, 2009 2:07 PM)  O2 Flow:  Room air CC: Pt here for follow up. Pt c/o still with productive cough with thick clear mucus but much better. Comments Medications reviewed with patient Verified contact number and pharmacy with patient Mary [name] CMA  July 12, 2009 2:08 PM    Physical Exam  General:  thin female in no acute distress Head:  normocephalic and atraumatic Eyes:  conjunctiva and sclera clear Nose:  no lesions or excoriations seen, some congestion Mouth:  no mucosal abnormalities Neck:  no masses, thyromegaly, or abnormal cervical nodes Lungs:  clear bilaterally Heart:  regular rate and rhythm, S1, S2 without murmurs, rubs, gallops, or clicks Abdomen:  not examined Extremities:  no clubbing, cyanosis, edema, or deformity noted Psych:  alert and cooperative; normal mood and affect; normal attention span and concentration   Impression & Recommendations:  Problem # 1:  BRONCHIECTASIS (ICD-494.0) Stable at this time.  - flutter two times a day  - may be able to come off the rotating abx if I can get her PND and UA cough under control - continue the rotation for now  Problem # 2:  COUGH, CHRONIC (ICD-786.2)  With newly documented allergies.  - benadryl + NSWs - considering starting immunotherapy per Dr Roxy Cedar eval  Orders: Est. Patient Level III ([address])  Medications Added to Medication List This Visit: 1)  Citracal Plus Tabs (Multiple minerals-vitamins) .... Take 1 tablet by mouth once a day 2)  Cephalexin 500 Mg Caps  (Cephalexin) .... Take 1 tab by mouth at bedtime  Patient Instructions: 1)  Continue your current benadryl, nasal washes, rotating antibiotics. 2)  Follow with Dr Maple Hudson as planned 3)  Continue your rotating antibiotics 4)  Follow with Dr Delton Coombes in 6 months or as needed.

## 2010-05-03 NOTE — Assessment & Plan Note (Signed)
Summary: Mary [name]   Copy to:  Dr. Levy Pupa Primary Provider/Referring Provider:  Dr. Lubertha South  CC:  Follow up visit-sneezing and dry cough at times; when productive-mornings and thick brownish/yellowish color. .  History of Present Illness: CC:  Allergy consult-drainage and cough.. History of Present Illness: April 01, 2009- New referral for this 65 yoF at kind request of Dr Delton Coombes, asking allergy evaluation. She is treated for hx of bronchiectasis. She complains of recurrent sinus infections since her teens. Skin tested 2-3 years ago, "positive for dust". Bothersome postnasal drip is worst in Spring and with dust exposure.As she has gotten older, she has been more aware of a seasonal pattern, but the drainage is perennial. She denies hx of asthma, or family hx of sinopulmonary disease. Strong smells and perfumes make her nose run. There are no animal exposures or problems with foods, skin rashes, latex, contrast dye or aspirin.  May 05, 2009- Bronchiectasis, allergy evaluation Feels the same with no acute respiratory issue. She is on Keflex for a UTI. She is aware of sneeze and nasal drip without dramatic change off antihistamine for skin testing. RAST- neg IgA elevated 448 (68-378). IgG and IgM are WNL. IgE wnl- 2.4 ST- Positive common inhalants- grass/ weed/ tree/ dust-mite.  Aug 03, 2009- Bronchiectasis, allergy evaluation She is feeling more trouble from seasonal rhinitis with postnasal drip, rhinorhea and cough. She says Dr Fletcher Anon office doesn't give allergy shots and she is reluctant to come down here regularly through the build-up schedule. She understands practice now is that allergy shots are given in a medical office if at all possible. We discussed comparison of antihistamines. She says benadryl  two times a day doesn't make her drowsy. Nasal steroids didn't help. She feels much better than a year ago when cough was worse,  and she credits alternating antibiotics.     Current Medications (verified): 1)  One-A-Day Extras Antioxidant   Caps (Multiple Vitamins-Minerals) .... Take 1 Tablet By Mouth Once A Day 2)  Aspirin 81 Mg  Tbec (Aspirin) .... Take 1 Tablet By Mouth Once A Day 3)  Fosamax 10 Mg Tabs (Alendronate Sodium) .Marland Kitchen.. 1 By Mouth Every Wk 4)  Prempro 0.3-1.5 Mg Tabs (Conj Estrog-Medroxyprogest Ace) .... Take 1 Tablet By Mouth Once A Day 5)  Benadryl 25 Mg Caps (Diphenhydramine Hcl) .... Two Times A Day 6)  Citracal Plus  Tabs (Multiple Minerals-Vitamins) .... Take 2 Tablet By Mouth Once A Day 7)  Doxycycline Hyclate 100 Mg Caps (Doxycycline Hyclate) .Marland Kitchen.. 1 By Mouth Two Times A Day For The First 14 Days of Alternating Months (Alternated With Azithromycin) 8)  Azithromycin 250 Mg Tabs (Azithromycin) .Marland Kitchen.. 1 By Mouth Once Daily For The First 5 Days of Alternating Months. (Alternated With Doxycycline). 9)  Pro-B Supplement .... Once Daily 10)  Cephalexin 500 Mg Caps (Cephalexin) .... 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.  Risk Factors: Smoking Status: never (04/01/2009)  Review of Systems      See HPI  The patient denies anorexia, fever, weight loss, weight gain, vision loss, decreased hearing, hoarseness, chest pain, syncope, dyspnea on exertion, peripheral edema, prolonged cough, headaches,  hemoptysis, and severe indigestion/heartburn.    Vital Signs:  Patient profile:   65 year old female Height:      65 inches Weight:      107 pounds BMI:     17.87 O2 Sat:      97 % on Room air Pulse rate:   72 / minute BP sitting:   104 / 58  (left arm) Cuff size:   regular  Vitals Entered By: Reynaldo Minium CMA (Aug 03, 2009 1:46 PM)  O2 Flow:  Room air  Physical Exam  Additional Exam:  General: A/Ox3; pleasant and cooperative, NAD, trim SKIN: no rash, lesions NODES: no lymphadenopathy HEENT: [place]/AT, EOM- WNL, Conjuctivae- clear, PERRLA, TM-WNL, Nose- clear, Throat- clear and wnl, Mallampatti  II NECK: Supple w/ fair ROM, JVD- none, normal carotid impulses w/o bruits Thyroid-  CHEST: coarse sounds, but no wheeze and unlabored HEART: RRR, no m/g/r heard ABDOMEN: Soft and nl; JWJ:XBJY, nl pulses, no edema  NEURO: Grossly intact to observation      Impression & Recommendations:  Problem # 1:  ALLERGIC RHINITIS (ICD-477.9)  She is interested in trying allergy shots for postnasal drip and cough. We discussed policy, risk/ benefit, anaphyllaxis, epipen. My staff talked with Dr Fletcher Anon office about their ability to give allergy vaccine so she wouldn't have to come down here as much. They may be willing after she reaches maintenance. She will make an appt there to discuss, and will call us if she still wants to try this therapy. Meanwhile, we thoroughly discussed her experience with antihistamines and nasal sprays. We will let her try Singulair.  Her updated medication list for this problem includes:    Benadryl 25 Mg Caps (Diphenhydramine hcl) .Marland Kitchen..Marland Kitchen Two times a day  Orders: Est. Patient Level III (78295)  Problem # 2:  BRONCHIECTASIS (ICD-494.0) She continues to follow with DR Byrum for chronic cough with bronchiectasis.  Medications Added to Medication List This Visit: 1)  Citracal Plus Tabs (Multiple minerals-vitamins) .... Take 2 tablet by mouth once a day 2)  Singulair  10 Mg Tabs (Montelukast sodium) .Marland Kitchen.. 1 daily 3)  Epipen 0.3 Mg/0.48ml Devi (Epinephrine) .... For severe allergic reaction  Patient Instructions: 1)  Please schedule a follow-up appointment in 3 months. 2)  Script for epipen 3)  Script for Singulair 4)  Make an appointmnet with Dr Gerda Diss to discuss getting allergy shots through his office. Prescriptions: EPIPEN 0.3 MG/0.3ML DEVI (EPINEPHRINE) For severe allergic reaction  #1 x prn   Entered and Authorized by:   Waymon Budge MD   Signed by:   Waymon Budge MD on 08/03/2009   Method used:   Print then Give to Patient   RxID:   [id number] SINGULAIR 10 MG TABS (MONTELUKAST SODIUM) 1 daily  #30 x prn   Entered and Authorized by:   Waymon Budge MD   Signed by:   Waymon Budge MD on 08/03/2009   Method used:   Print then Give to Patient   RxID:   [id number]

## 2010-05-05 NOTE — Miscellaneous (Signed)
Summary: Vaccine Rx  Vaccine Rx   Imported By: Lester [place] 04/15/2010 11:15:54  _____________________________________________________________________  External Attachment:    Type:   Image     Comment:   External Document

## 2010-05-06 DIAGNOSIS — J301 Allergic rhinitis due to pollen: Secondary | ICD-10-CM

## 2010-05-09 DIAGNOSIS — J301 Allergic rhinitis due to pollen: Secondary | ICD-10-CM

## 2010-05-13 ENCOUNTER — Ambulatory Visit (INDEPENDENT_AMBULATORY_CARE_PROVIDER_SITE_OTHER): Payer: BC Managed Care – PPO

## 2010-05-13 DIAGNOSIS — J301 Allergic rhinitis due to pollen: Secondary | ICD-10-CM

## 2010-05-16 ENCOUNTER — Ambulatory Visit (INDEPENDENT_AMBULATORY_CARE_PROVIDER_SITE_OTHER): Payer: BC Managed Care – PPO

## 2010-05-16 DIAGNOSIS — J301 Allergic rhinitis due to pollen: Secondary | ICD-10-CM

## 2010-05-20 ENCOUNTER — Ambulatory Visit (INDEPENDENT_AMBULATORY_CARE_PROVIDER_SITE_OTHER): Payer: BC Managed Care – PPO

## 2010-05-20 DIAGNOSIS — J301 Allergic rhinitis due to pollen: Secondary | ICD-10-CM

## 2010-05-23 ENCOUNTER — Ambulatory Visit (INDEPENDENT_AMBULATORY_CARE_PROVIDER_SITE_OTHER): Payer: BC Managed Care – PPO

## 2010-05-23 DIAGNOSIS — J301 Allergic rhinitis due to pollen: Secondary | ICD-10-CM

## 2010-05-27 ENCOUNTER — Ambulatory Visit (INDEPENDENT_AMBULATORY_CARE_PROVIDER_SITE_OTHER): Payer: BC Managed Care – PPO

## 2010-05-27 DIAGNOSIS — J301 Allergic rhinitis due to pollen: Secondary | ICD-10-CM

## 2010-05-30 ENCOUNTER — Encounter: Payer: Self-pay | Admitting: Internal Medicine

## 2010-05-30 ENCOUNTER — Ambulatory Visit (INDEPENDENT_AMBULATORY_CARE_PROVIDER_SITE_OTHER): Payer: BC Managed Care – PPO

## 2010-05-30 DIAGNOSIS — J301 Allergic rhinitis due to pollen: Secondary | ICD-10-CM

## 2010-06-03 ENCOUNTER — Ambulatory Visit (INDEPENDENT_AMBULATORY_CARE_PROVIDER_SITE_OTHER): Payer: BC Managed Care – PPO

## 2010-06-03 ENCOUNTER — Encounter: Payer: Self-pay | Admitting: Internal Medicine

## 2010-06-03 DIAGNOSIS — J301 Allergic rhinitis due to pollen: Secondary | ICD-10-CM

## 2010-06-06 ENCOUNTER — Encounter: Payer: Self-pay | Admitting: Internal Medicine

## 2010-06-06 ENCOUNTER — Ambulatory Visit (INDEPENDENT_AMBULATORY_CARE_PROVIDER_SITE_OTHER): Payer: BC Managed Care – PPO

## 2010-06-06 DIAGNOSIS — J301 Allergic rhinitis due to pollen: Secondary | ICD-10-CM

## 2010-06-09 NOTE — Assessment & Plan Note (Signed)
Summary: ALLERGY/CB  Nurse Visit   Allergies: 1)  ! * Daliresp 2)  Bactrim  Orders Added: 1)  Allergy Injection (1) [45409]

## 2010-06-09 NOTE — Miscellaneous (Signed)
Summary: Orders Update   Clinical Lists Changes  Problems: Added new problem of ALLERGIC RHINITIS DUE TO POLLEN (ICD-477.0) Orders: Added new Service order of Allergy Injection (1) (95115) - Signed 

## 2010-06-10 ENCOUNTER — Encounter: Payer: Self-pay | Admitting: Internal Medicine

## 2010-06-10 ENCOUNTER — Ambulatory Visit (INDEPENDENT_AMBULATORY_CARE_PROVIDER_SITE_OTHER): Payer: BC Managed Care – PPO

## 2010-06-10 DIAGNOSIS — J301 Allergic rhinitis due to pollen: Secondary | ICD-10-CM

## 2010-06-13 ENCOUNTER — Encounter: Payer: Self-pay | Admitting: Internal Medicine

## 2010-06-13 ENCOUNTER — Ambulatory Visit (INDEPENDENT_AMBULATORY_CARE_PROVIDER_SITE_OTHER): Payer: BC Managed Care – PPO

## 2010-06-13 ENCOUNTER — Ambulatory Visit (INDEPENDENT_AMBULATORY_CARE_PROVIDER_SITE_OTHER): Payer: BC Managed Care – PPO | Admitting: Internal Medicine

## 2010-06-13 DIAGNOSIS — J301 Allergic rhinitis due to pollen: Secondary | ICD-10-CM | POA: Insufficient documentation

## 2010-06-13 DIAGNOSIS — J479 Bronchiectasis, uncomplicated: Secondary | ICD-10-CM

## 2010-06-13 DIAGNOSIS — J309 Allergic rhinitis, unspecified: Secondary | ICD-10-CM

## 2010-06-14 NOTE — Assessment & Plan Note (Signed)
Summary: ALLERGY/CB  Nurse Visit   Allergies: 1)  ! * Daliresp 2)  Bactrim  Medication Administration  Injection # 1:    Medication: Allergy Injection (1)    Diagnosis: ALLERGIC RHINITIS DUE TO POLLEN (ICD-477.0)  Orders Added: 1)  Allergy Injection (1) [95115]   Medication Administration  Injection # 1:    Medication: Allergy Injection (1)    Diagnosis: ALLERGIC RHINITIS DUE TO POLLEN (ICD-477.0)  Orders Added: 1)  Allergy Injection (1) [04540]

## 2010-06-14 NOTE — Assessment & Plan Note (Signed)
Summary: allergy/cb  Nurse Visit   Allergies: 1)  ! * Daliresp 2)  Bactrim  Orders Added: 1)  Allergy Injection (1) [16109]

## 2010-06-15 ENCOUNTER — Encounter: Payer: Self-pay | Admitting: Internal Medicine

## 2010-06-15 ENCOUNTER — Ambulatory Visit (INDEPENDENT_AMBULATORY_CARE_PROVIDER_SITE_OTHER): Payer: BC Managed Care – PPO

## 2010-06-15 DIAGNOSIS — J301 Allergic rhinitis due to pollen: Secondary | ICD-10-CM

## 2010-06-17 ENCOUNTER — Encounter: Payer: Self-pay | Admitting: Internal Medicine

## 2010-06-17 ENCOUNTER — Ambulatory Visit (INDEPENDENT_AMBULATORY_CARE_PROVIDER_SITE_OTHER): Payer: BC Managed Care – PPO

## 2010-06-17 DIAGNOSIS — J301 Allergic rhinitis due to pollen: Secondary | ICD-10-CM

## 2010-06-20 ENCOUNTER — Encounter: Payer: Self-pay | Admitting: Internal Medicine

## 2010-06-20 ENCOUNTER — Ambulatory Visit (INDEPENDENT_AMBULATORY_CARE_PROVIDER_SITE_OTHER): Payer: BC Managed Care – PPO

## 2010-06-20 DIAGNOSIS — J301 Allergic rhinitis due to pollen: Secondary | ICD-10-CM

## 2010-06-21 NOTE — Assessment & Plan Note (Signed)
Summary: 3 month rov   Copy to:  Dr. Levy Pupa Primary Provider/Referring Provider:  Dr. Lubertha South  CC:  3 month follow up visit-cough-productive when not on abx. Sneezing.Marland Kitchen  History of Present Illness: November 09, 2009- Bronchiectasis, allergy evaluation She got Dr Delton Coombes to give her an antibiotic July, 1, then he had her go  back to alternating zithromax/ doxycycline to suppress bronchitis. She has coughed through the summer/ clear to brown. Always has annoying postnasal drip and variable nasal congestion. February 07, 2010- Bronchiectasis, allergy evaluation Nurse-CC: 3 month follow up visit-allergies have been better since last visit-"better time of the year". Fall is better than Spring. Daliresp kept her awake- dc'd. Trying rotating doxy/ azithromycin. Doxy best, still coughs. She feels ready to try allergy shots but needs to wait till she can travel routinely. Dr Gerda Diss can give maintenance but not buildup shots..   June 13, 2010- Bronchiectasis ( [place]), allergy (Delorese Sellin) Nurse-CC: 3 month follow up visit-cough-productive when not on abx. Sneezing. No bad respiratory issues this Winter.  Allergy vaccine build-up here- now at 1:5000. Had some runny nose 2-3 weeks ago, now better.  She can't tell when infection and when allergy- discussed.  Coughs discolored sputum and feels very tired, unless she is on antibiotics. Cycling for 10 day RX every 20 days. sometimes has to start early. Family hx allergies, no COPD. Continues benadryl every night to dry her up.     Preventive Screening-Counseling & Management  Alcohol-Tobacco     Smoking Status: never  Current Medications (verified): 1)  One-A-Day Extras Antioxidant   Caps (Multiple Vitamins-Minerals) .... Take 1 Tablet By Mouth Once A Day 2)  Aspirin 81 Mg  Tbec (Aspirin) .... Take 1 Tablet By Mouth Once A Day 3)  Fosamax 10 Mg Tabs (Alendronate Sodium) .Marland Kitchen.. 1 By Mouth Every Wk 4)  Prempro 0.3-1.5 Mg Tabs (Conj  Estrog-Medroxyprogest Ace) .... Take 1 Tablet By Mouth Once A Day 5)  Benadryl 25 Mg Caps (Diphenhydramine Hcl) .... Take 1 By Mouth At Bedtime 6)  Citracal Plus  Tabs (Multiple Minerals-Vitamins) .... Take 2 Tablet By Mouth Once A Day 7)  Doxycycline Hyclate 100 Mg Caps (Doxycycline Hyclate) .Marland Kitchen.. 1 By Mouth Two Times A Day For The First 10 Days of Alternating Months (Alternated With Azithromycin) 8)  Azithromycin 250 Mg Tabs (Azithromycin) .Marland Kitchen.. 1 By Mouth Once Daily For The First 10 Days of Alternating Months. (Alternated With Doxycycline). 9)  Pro-B Supplement .... Once Daily 10)  Cephalexin 500 Mg Caps (Cephalexin) .... Take 1 Tab By Mouth As Needed 11)  Allergy Vaccine Start New Jan, 2012. .... Gets Here  Allergies (verified): 1)  ! 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  Social History: Last updated: 01/24/2010 Patient never smoked.  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   Risk Factors: Smoking Status: never (06/13/2010)  Family History: allergies-mother heart disease-mother, father Father- died CVA Mother- died respiratory failure.  Review of Systems      See HPI       The patient complains of  shortness of breath with activity and productive cough.  The patient denies shortness of breath at rest, non-productive cough, coughing up blood, chest pain, irregular heartbeats, acid  heartburn, indigestion, loss of appetite, weight change, abdominal pain, difficulty swallowing, sore throat, tooth/dental problems, headaches, nasal congestion/difficulty breathing through nose, sneezing, itching, ear ache, rash, change in color of mucus, and fever.    Vital Signs:  Patient profile:   65 year old female Height:      65 inches Weight:      109.25 pounds BMI:     18.25 O2 Sat:      97 % on Room air Pulse rate:   71 / minute BP sitting:   114 / 56  (left arm) Cuff size:   regular  Vitals Entered By: Reynaldo Minium CMA (June 13, 2010 3:02 PM)  O2 Flow:  Room air CC: 3 month follow up visit-cough-productive when not on abx. Sneezing.   Physical Exam  Additional Exam:  General: A/Ox3; pleasant and cooperative, NAD, trim SKIN: no rash, lesions NODES: no lymphadenopathy HEENT: [place]/AT, EOM- WNL, Conjuctivae- clear, PERRLA, TM-WNL, Nose- very pale with abundant clear mucus, Throat- clear and wnl, [place]  II NECK: Supple w/ fair ROM, JVD- none, normal carotid impulses w/o bruits Thyroid-  CHEST: coarse sounds, but no wheeze and unlabored HEART: RRR, no m/g/r heard ABDOMEN: Soft and nl; JYN:WGNF, nl pulses, no edema  NEURO: Grossly intact to observation      Impression & Recommendations:  Problem # 1:  ALLERGIC RHINITIS (ICD-477.9)  She is building appropriately but will need more time for best effect. meanwhile she can add extra antihistamine or we can use a nasal steroid.  The following medications were removed from the medication list:    Ipratropium Bromide 0.03 % Soln (Ipratropium bromide) .Marland Kitchen... 1-2 sprays each nostril up to three times a day as needed Her updated medication list for this problem includes:    Benadryl 25 Mg Caps (Diphenhydramine hcl) .Marland Kitchen... Take 1 by mouth at bedtime  Problem # 2:  BRONCHIECTASIS (ICD-494.0) I suggested an a1AT screen  Medications Added to Medication List This Visit: 1)  Benadryl 25 Mg Caps (Diphenhydramine hcl) ....  Take 1 by mouth at bedtime 2)  Allergy Vaccine Start New Jan, 2012.  .... Gets here  Other Orders: Est. Patient Level III (62130)  Patient Instructions: 1)  Please schedule a follow-up appointment in 3 months. 2)  Continue building allergy vaccine 3)  a1AT screen

## 2010-06-21 NOTE — Assessment & Plan Note (Signed)
Summary: EXTRACT/10/CB  Nurse Visit   Allergies: 1)  ! * Daliresp 2)  Bactrim  Orders Added: 1)  [facility],1 or multi Secondary school teacher) [phone]

## 2010-06-21 NOTE — Assessment & Plan Note (Signed)
Summary: allergy/cb  Nurse Visit   Allergies: 1)  ! * Daliresp 2)  Bactrim  Orders Added: 1)  Allergy Injection (1) [95115] 

## 2010-06-24 ENCOUNTER — Ambulatory Visit (INDEPENDENT_AMBULATORY_CARE_PROVIDER_SITE_OTHER): Payer: BC Managed Care – PPO

## 2010-06-24 DIAGNOSIS — J301 Allergic rhinitis due to pollen: Secondary | ICD-10-CM

## 2010-06-27 ENCOUNTER — Ambulatory Visit (INDEPENDENT_AMBULATORY_CARE_PROVIDER_SITE_OTHER): Payer: BC Managed Care – PPO

## 2010-06-27 DIAGNOSIS — J301 Allergic rhinitis due to pollen: Secondary | ICD-10-CM

## 2010-06-30 NOTE — Assessment & Plan Note (Signed)
Summary: allergy/cb  Nurse Visit   Allergies: 1)  ! * Daliresp 2)  Bactrim  Orders Added: 1)  Allergy Injection (1) [95115] 

## 2010-07-01 ENCOUNTER — Ambulatory Visit (INDEPENDENT_AMBULATORY_CARE_PROVIDER_SITE_OTHER): Payer: BC Managed Care – PPO

## 2010-07-01 DIAGNOSIS — J301 Allergic rhinitis due to pollen: Secondary | ICD-10-CM

## 2010-07-04 ENCOUNTER — Ambulatory Visit (INDEPENDENT_AMBULATORY_CARE_PROVIDER_SITE_OTHER): Payer: BC Managed Care – PPO

## 2010-07-04 DIAGNOSIS — J301 Allergic rhinitis due to pollen: Secondary | ICD-10-CM

## 2010-07-11 ENCOUNTER — Ambulatory Visit (INDEPENDENT_AMBULATORY_CARE_PROVIDER_SITE_OTHER): Payer: BC Managed Care – PPO

## 2010-07-11 DIAGNOSIS — J301 Allergic rhinitis due to pollen: Secondary | ICD-10-CM

## 2010-07-15 ENCOUNTER — Ambulatory Visit (INDEPENDENT_AMBULATORY_CARE_PROVIDER_SITE_OTHER): Payer: BC Managed Care – PPO

## 2010-07-15 DIAGNOSIS — J309 Allergic rhinitis, unspecified: Secondary | ICD-10-CM

## 2010-07-18 ENCOUNTER — Ambulatory Visit (INDEPENDENT_AMBULATORY_CARE_PROVIDER_SITE_OTHER): Payer: BC Managed Care – PPO

## 2010-07-18 DIAGNOSIS — J309 Allergic rhinitis, unspecified: Secondary | ICD-10-CM

## 2010-07-22 ENCOUNTER — Ambulatory Visit (INDEPENDENT_AMBULATORY_CARE_PROVIDER_SITE_OTHER): Payer: BC Managed Care – PPO

## 2010-07-22 DIAGNOSIS — J309 Allergic rhinitis, unspecified: Secondary | ICD-10-CM

## 2010-07-25 ENCOUNTER — Ambulatory Visit (INDEPENDENT_AMBULATORY_CARE_PROVIDER_SITE_OTHER): Payer: BC Managed Care – PPO

## 2010-07-25 DIAGNOSIS — J309 Allergic rhinitis, unspecified: Secondary | ICD-10-CM

## 2010-07-29 ENCOUNTER — Ambulatory Visit (INDEPENDENT_AMBULATORY_CARE_PROVIDER_SITE_OTHER): Payer: BC Managed Care – PPO

## 2010-07-29 DIAGNOSIS — J309 Allergic rhinitis, unspecified: Secondary | ICD-10-CM

## 2010-08-01 ENCOUNTER — Ambulatory Visit (INDEPENDENT_AMBULATORY_CARE_PROVIDER_SITE_OTHER): Payer: BC Managed Care – PPO

## 2010-08-01 DIAGNOSIS — J309 Allergic rhinitis, unspecified: Secondary | ICD-10-CM

## 2010-08-03 ENCOUNTER — Encounter: Payer: Self-pay | Admitting: Emergency Medicine

## 2010-08-05 ENCOUNTER — Ambulatory Visit (INDEPENDENT_AMBULATORY_CARE_PROVIDER_SITE_OTHER): Payer: BC Managed Care – PPO | Admitting: Emergency Medicine

## 2010-08-05 ENCOUNTER — Encounter: Payer: Self-pay | Admitting: Emergency Medicine

## 2010-08-05 ENCOUNTER — Ambulatory Visit (INDEPENDENT_AMBULATORY_CARE_PROVIDER_SITE_OTHER): Payer: BC Managed Care – PPO

## 2010-08-05 VITALS — BP 104/60 | HR 67 | Temp 98.3°F | Ht 65.0 in | Wt 108.0 lb

## 2010-08-05 DIAGNOSIS — J309 Allergic rhinitis, unspecified: Secondary | ICD-10-CM

## 2010-08-05 DIAGNOSIS — J479 Bronchiectasis, uncomplicated: Secondary | ICD-10-CM

## 2010-08-05 MED ORDER — DOXYCYCLINE HYCLATE 100 MG PO CPEP: ORAL_CAPSULE | ORAL | Status: DC

## 2010-08-05 MED ORDER — AZITHROMYCIN 250 MG PO TABS: ORAL_TABLET | ORAL | Status: DC

## 2010-08-05 NOTE — Progress Notes (Signed)
Addended by: Levy Pupa on: 08/05/2010 05:30 PM   Modules accepted: Orders, Medications

## 2010-08-05 NOTE — Assessment & Plan Note (Signed)
CT scan chest without contrast Continue allergy regimen and shots Continue to alternate doxy and azithro Follow up with Dr Delton Coombes in 6 months.

## 2010-08-05 NOTE — Progress Notes (Signed)
  Subjective:    Patient ID: Mary [name], female    DOB: 08-Jan-1946, 65 y.o.   MRN: [id number]  HPI Mary [name] is a 65 year old woman whom I have followed for chronic cough. 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 Evaluated 2011 by Dr Maple Hudson for allergies - shows multiple sensitivities that are present year round.   ROV 07/12/09 -- returns for f/u. Tells me that her PND has actually improved since last visit. Her regimen is benadryl 25mg  two times a day, NSW two times a day, flutter valve two times a day - sometimes productive but often not. Also on rotating doxy + azithromycin. No flares of bronchiectasis since last visit. She is contemplating starting allergy shots, hopefully can get them from her PCP.   ROV 10/01/09 -- f/u bronchiectasis, cough. Flare that started about a week ago. Began treatment with Avelox and set up this follow up. Was exposed to husband who had a URI. Changed her benadryl to Claritin D. She has not started allergy shots yet. Starting to feel better, not back to baseline. never started Singulair.   ROV 01/24/10 -- Hx chronic cough, bronchiectasis. Tried daliresp, couldn't tolerate due to insomnia. Still with clear drainage, never responded to treatment with nasal steroid. On benadryl qhs. Taking alternating doxy and azithro monthly. Considering starting allergy shots - discussing w Dr Maple Hudson.   ROV 08/05/10 -- bronchiectasis, chronic cough, severe allergic rhinitis. Doing NSW qd and flutter qd.  She is on allergy shots and nasal gtt may be better. She is alternating doxy with azithro at the beginning of every month. Her cough is predicatble - morning and afternoon. Worst if she has been off abx for an extended period. Last CT scan was 2008.     Review of Systems As per HPI    Objective:   Physical Exam Gen: Pleasant, well-nourished, in no distress,  normal affect  ENT: No lesions,  mouth clear,   oropharynx clear, no postnasal drip  Neck: No JVD, no TMG, no carotid bruits  Lungs: clear B  Cardiovascular: RRR, heart sounds normal, no murmur or gallops, no peripheral edema  Musculoskeletal: No deformities, no cyanosis or clubbing  Neuro: alert, non focal  Skin: Warm, no lesions or rashes       Assessment & Plan:

## 2010-08-05 NOTE — Patient Instructions (Signed)
We will perform a CT scan of the chest  Continue your allergy shots Continue to do your nasal saline washes and flutter valve Continue to alternate your doxycycline and azithromycin.  Follow up with Dr Delton Coombes in 6 months or sooner if needed

## 2010-08-10 ENCOUNTER — Ambulatory Visit (INDEPENDENT_AMBULATORY_CARE_PROVIDER_SITE_OTHER): Payer: BC Managed Care – PPO

## 2010-08-10 ENCOUNTER — Ambulatory Visit (INDEPENDENT_AMBULATORY_CARE_PROVIDER_SITE_OTHER)
Admission: RE | Admit: 2010-08-10 | Discharge: 2010-08-10 | Disposition: A | Discharge status: Home / Self Care | Payer: BC Managed Care – PPO | Source: Ambulatory Visit | Attending: Emergency Medicine | Admitting: Emergency Medicine

## 2010-08-10 DIAGNOSIS — J309 Allergic rhinitis, unspecified: Secondary | ICD-10-CM

## 2010-08-10 DIAGNOSIS — J479 Bronchiectasis, uncomplicated: Secondary | ICD-10-CM

## 2010-08-16 NOTE — Assessment & Plan Note (Signed)
[facility]                             PULMONARY OFFICE NOTE   Mary, [name]                  MRN:          [id number]  DATE:01/09/2007                            DOB:          1945/09/13    PULMONARY FOLLOWUP VISIT   SUBJECTIVE:  Mary [name] is a pleasant 65 year old woman with  bronchiectasis and severe allergic rhinitis.  Her rhinitis has been very  difficult to treat.  She has clear chronic drainage that is bothersome  all during the day, but it is particularly bothersome in the middle of  the day.  At times, it causes her to cough paroxysmally and  uncontrollably.  It is bothersome also at night.  She currently does  nasal saline washes, as well as antihistamines.  I started her on  Atrovent nasal spray after a CT scan of her sinuses did not show any  evidence of acute or chronic sinusitis.  Unfortunately, the Atrovent has  not changed her post-nasal drip to any degree.  She uses a flutter valve  p.r.n.  She has been treated in the past by mycobacterium avium, but her  most recent microbiology has not shown recurrence of this organism.  She  has daily scant to moderate yellowish phlegm.  She denies any purulence,  change in amount, or blood streaking of her mucus at this time.   CURRENT MEDICATIONS:  1. Prempro 0.3/1.5 mg daily.  2. Viactiv 2 daily.  3. Multivitamin once daily.  4. Vitamin E 400 IU daily.  5. Aspirin 81 mg daily.  6. Fosamax 70 mg q. week.  7. Claritin 10 mg daily.  8. Nasal saline washes once daily.  9. Atrovent nasal spray 2 sprays to each nostril b.i.d.   EXAM:  In general, this is a pleasant thin woman who is in no distress.  Her weight is 111 pounds, temperature 98.0, blood pressure 120/66, heart  rate 71, SpO2 96% on room air.  HEENT:  The posterior oropharynx is erythematous.  There is some mild  post-nasal drip.  No exudate.  NECK:  Supple without lymphadenopathy or stridor.  LUNGS:  Clear to  auscultation bilaterally.  HEART:  Regular without murmur.  ABDOMEN:  Benign.  EXTREMITIES:  No cyanosis, clubbing, or edema.   IMPRESSION:  1. 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  2. 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   PLAN:  1. I will ask her to use her flutter valve on an as needed basis.      Could increase its frequency to see if this helps with her baseline      cough.  2. I will change her Atrovent nasal spray back to Nasacort AQ 2 sprays      each nostril daily.  I will change her claritin to Xyzal 5 mg      daily.  I will start Singulair 10 mg q. p.m.  I will also check an      IgE and obtain Mary [name]'s allergy  testing results from Dr.      Lacretia Nicks.  It may be that she would benefit from therapy for      eosinophilic allergic rhinitis, possibly even with Xolair, although      she does not have documented asthma.  I will follow up with Ms.      [name] in 6 weeks to review her status on the new medications      and the results of her labs.     Leslye Peer, MD  Electronically Signed    RSB/MedQ  DD: 01/09/2007  DT: 01/09/2007  Job #: [id number]

## 2010-08-16 NOTE — Assessment & Plan Note (Signed)
[facility]                             PULMONARY OFFICE NOTE   KIMYA, MCCAHILL                  MRN:          [id number]  DATE:12/10/2006                            DOB:          1945/08/12    SUBJECTIVE:  Mary [name] is a pleasant 65 year old woman with a  history of bronchiectasis and chronic cough.  She has been treated in  the past for Mycobacterium avium-intracellulare complex infection and  cultures done in July have been negative to date.  I treated her for  acute sinusitis with improvement in nasal congestion, purulent nasal  drainage, and also her cough.  She returns today to notify me that after  about 2 weeks of much improved symptoms, she is beginning to have clear  nasal drainage again as well as sinus fullness and sinus headache.  In  the setting of these symptoms, her cough has worsened and she is having  more purulent sputum.  She denies any fevers, chills, or sweats.   MEDICATIONS:  1. Prempro 0.3/1.5 mg daily.  2. __________  2 daily.  3. Multivitamin once daily.  4. Vitamin E 400 international units daily.  5. Aspirin 81 mg daily.  6. Fosamax 70 mg every week.  7. Claritin 10 mg daily.  8. Nasacort AQ 2 sprays each nostril daily.  9. Nasal saline washes once daily.   PHYSICAL EXAMINATION:  GENERAL:  This is a thin, well-appearing woman in  no distress.  VITAL SIGNS:  Her weight is 109 pounds, temperature is 97.9, blood  pressure 98/60, heart rate 73, SpO2 97% on room air.  HEENT:  The oropharynx is moist.  She has some mild posterior  oropharyngeal erythema consistent with postnasal drip.  NECK:  Without any stridor or lymphadenopathy.  LUNGS:  Clear to auscultation bilaterally.  HEART:  Regular without murmur.  ABDOMEN:  Soft, nontender, nondistended.  Positive bowel sounds.  EXTREMITIES:  No cyanosis, clubbing, or edema.   IMPRESSION:  1. Bronchiectasis.  2. Sinus fullness consistent with possible chronic  sinusitis.  I      wonder whether this is driving her chronic cough.  3. Allergic rhinitis with postnasal drip.  Certainly this is a factor      contributing to her cough as well.   PLAN:  1. A CT scan of the sinuses to rule out chronic sinusitis.  2. Continue Claritin, nasal saline washes, and Nasacort as ordered.  I      would consider adding Atrovent nasal spray or changing her Nasacort      to Atrovent nasal spray if her CT of the sinuses does not show any      chronic sinusitis.  3. If she does have chronic sinusitis, then I will need to treat her      with antibiotics for a 6-week course.  4. If these symptoms persist after the above, then I believe she may      benefit from an ENT referral.     Leslye Peer, MD  Electronically Signed    RSB/MedQ  DD: 12/10/2006  DT: 12/10/2006  Job #: [id number]

## 2010-08-16 NOTE — Assessment & Plan Note (Signed)
[facility]                             PULMONARY OFFICE NOTE   MARTHENA, WHITMYER                  MRN:          [id number]  DATE:11/02/2006                            DOB:          06-10-1945    Ms. Prezioso is a 65 year old woman with bronchiectasis and chronic  cough. She returns today reporting that her cough is much improved and  that her purulent nasal drainage has for the most part resolved. She  does feel sometimes the purulent drainage in the mornings. The bad taste  in her mouth has resolved after treatment for 14 days with Augmentin.   MEDICATIONS:  Were reviewed and correct as on her clinic chart.   PHYSICAL EXAMINATION:  This is a thin, well-appearing woman in no  distress. Weight is 107 pounds. Temperature 97.9, blood pressure 108/64,  heart rate 64, SPO2 is 96% on room air.  HEENT: The oropharynx is moist without lesions.  NECK:  There is no stridor. No lymphadenopathy.  LUNGS:  Are for the most part clear without any wheezing or forced  expiration.  HEART: Is regular without murmur.  ABDOMEN: Is soft, nontender, nondistended, positive bowel sounds. Marland Kitchen  EXTREMITIES: Have no cyanosis, clubbing or edema.   IMPRESSION:  1. Chronic cough in the setting of bronchiectasis and apparent episode      of acute sinusitis and allergic rhinitis with post-nasal drip. Her      cough is currently improved after treatment for acute sinusitis. At      this time, will continue her Claritin and Nasacort as well as daily      nasal saline washes. I will follow her cultures which to this point      have been negative. They are not yet complete (ordered October 15, 2006). If her nasal or sinus symptoms worsen then I would like to      CT scan her sinuses to look for chronic sinusitis.  2. She will followup with me in four months or sooner should she have      any difficulty in the interim.     Leslye Peer, MD  Electronically  Signed    RSB/MedQ  DD: 12/10/2006  DT: 12/10/2006  Job #: [id number]

## 2010-08-16 NOTE — Op Note (Signed)
NAMEDAMONIE, FURNEY           ACCOUNT NO.:  [id number]   MEDICAL RECORD NO.:  [id number]          PATIENT TYPE:  OUT   LOCATION:  CARD                         FACILITY:  [facility]   PHYSICIAN:  Leslye Peer, MD    DATE OF BIRTH:  October 22, 1945   DATE OF PROCEDURE:  04/08/2007  DATE OF DISCHARGE:                               OPERATIVE REPORT   PROCEDURE:  Fiberoptic bronchoscopy with bronchoalveolar lavage and  posterior pharyngeal biopsy.   SURGEON:  Leslye Peer, M.D.   INDICATIONS:  Cough.   MEDICATIONS GIVEN:  1. Fentanyl 100 mcg IV in divided doses.  2. Versed 3 mg IV in divided dose.  3. Lidocaine 1% for local anesthesia in the bronchoalveolar tree for      20 mL total.   CONSENT:  Informed consent was obtained from the patient, and a signed  copy is on her hospital chart.   PROCEDURE DETAILS:  After informed consent was obtained as outlined  above, conscious sedation was initiated as outlined above.  The  fiberoptic bronchoscope was introduced through the left naris without  difficulty.  Visualization of the posterior pharynx was initially  difficult due to significant posterior pharyngeal and erythema with  redundant tissue and cobblestoning.  There was some white mucous that  was easily suctioned.  Brushings and a single forceps biopsy were  performed of the posterior pharynx.  The cords were normal in appearance  and moved normally with phonation.  There was some white mucous on the  cords that was easily suctioned.  The trachea was intubated and was  examined and was normal as were all the distal airways including the  bilateral mainstem bronchi, the left upper lobe lingula, the left lower  lobe bronchi, and the right upper lobe, right middle lobe, and right  lower lobe bronchi.  No endobronchial lesions were seen.  There were  scant easily suctioned white mucous in all airways.  Bronchoalveolar  lavage was performed in the right upper lobe with 60 mL of normal  saline  instilled and approximately 20 mL returned.  This will be sent for  bacterial, fungal, and mycobacterial culture.   The patient tolerated the procedure well.  There was no bleeding.  She  returned to the holding area in good condition.   SAMPLES:  1. Bronchoalveolar lavage from the right upper lobe to be sent for      microbiology.  2. Posterior pharyngeal brushings to be sent for cytology.  3. Posterior pharyngeal forceps biopsy to be sent for pathology.   PLANS:  I will follow up with Ms. Landsberg by phone to review the  results of her biopsies and bronchoalveolar lavage.      Leslye Peer, MD  Electronically Signed     RSB/MEDQ  D:  04/08/2007  T:  04/08/2007  Job:  [id number]

## 2010-08-16 NOTE — Assessment & Plan Note (Signed)
Mary [name]                             PULMONARY OFFICE NOTE   SHAVELL, NORED                  MRN:          [id number]  DATE:02/20/2007                            DOB:          20-May-1945    SUBJECTIVE:  Ms. Reining is a 65 year old woman whom I have followed  for 2 years for chronic cough.  She has bronchiectasis and severe  vasomotor rhinitis, which has been almost impossible to treat.  I have  tried her on Atrovent nasal spray, Astelin nasal spray, as well as a  good regimen for allergic rhinitis.  She continues to have clear nasal  drainage on an every-day basis.  She has dry cough that is associated  with this.  She also has episodic cough that is productive of greenish  phlegm.  This bothers her most often in the middle of the day.  She has  only been using the flutter valve rarely.  She is not having any  dyspnea.   CURRENT MEDICATIONS:  1. Prempro 0.3 mg/1.5 mg daily.  2. Viactiv 2 daily.  3. Multivitamin once daily.  4. Vitamin E 400 IU daily.  5. Aspirin 81 mg daily.  6. Fosamax 70 mg q. week.  7. Nasacort AQ 2 sprays in each nostril daily.  8. Nasal saline washes once daily.  9. Singulair 10 mg daily.  10.Xyzal 5 mg daily.   EXAM:  GENERAL:  Ms. Ingram is a thin woman who is in no distress.  Her weight is 111 pounds, temperature 97.9, blood pressure 104/62, heart  rate is 64, SpO2 98% on room air.  HEENT:  Posterior pharynx is erythematous.  She has no evidence of  active post-nasal drip.  NECK:  Supple without lymphadenopathy or stridor.  LUNGS:  Clear to auscultation bilaterally.  I do not hear any crackles  or wheezes.  HEART:  Regular without a murmur.  ABDOMEN AND EXTREMITIES:  Not examined today.   An allergen/allergy profile was performed on January 10, 2007.  This  showed a total IgE of 5.2.  She was completely non-reactive to all  allergens, including mite allergens, epidermal allergens, grass, house  mold, and weeds.   IMPRESSION:  1. Bronchiectasis with a history of mycobacterium avium-intracellulare      infection with no evidence currently for recurrence.  2. Chronic vasomotor rhinitis with associated cough that has been      extremely difficult to treat.   PLAN:  I have asked her to stop her allergy regimen, given her recent  lab results.  She will stop her Singulair, Xyzal, and Nasacort.  She  will continue her nasal saline washes on an as needed basis.  She will  begin using pseudoephedrine/chlorpheniramine 120 mg of pseudoephedrine  sustained release b.i.d. for the next month.  She will return to see me  in 4 weeks to see if this helps her vasomotor rhinitis, and thereby her  cough.  She will continue to use her flutter valve on an as needed  basis.     Leslye Peer, MD  Electronically Signed    RSB/MedQ  DD:  02/20/2007  DT: 02/21/2007  Job #: [id number]

## 2010-08-16 NOTE — Assessment & Plan Note (Signed)
[facility]                             PULMONARY OFFICE NOTE   LINDAMARIE, MACLACHLAN                  MRN:          [id number]  DATE:10/12/2006                            DOB:          10/20/1945    PULMONARY FOLLOW-UP VISIT:   SUBJECTIVE:  [name] is a 65 year old woman with bronchiectasis,  history of Mycobacterium avium intracellulare complex infection, and  chronic cough.  She returns today complaining of continued productive,  purulent cough that has been bothersome since June.  The cough initially  in June was characterized by green sputum.  The sputum has now turned to  a yellowish-brown color and she has developed purulent nasal drainage  and a bad taste in her mouth.  The cough is continuous and it can be  paroxysmal with uncontrollable episodes at any time during the day.  She  produces sputum most readily in the mornings and midday and then in the  evening before she goes to bed.  She has not been using a flutter valve  because she feels that she is able to clear secretions without any  assistance from the valve.  She was seen in June by Dr. Sherene Sires and was  treated with levofloxacin for 5 days, given Mucinex to help with  secretion clearance.  It was noted on that visit that she is on Prempro  and Fosamax, which can both bother reflux.  She denies any reflux  symptoms and does not believe the reflux is a component of her cough.  Her most recent sputum from December 2007 did not show any AFB after  treatment with ethambutol and azithromycin.  Her last CT scan was in May  of this year, which showed stable bronchiectasis and a few new patchy  areas of airspace inflammation.   MEDICATIONS:  1. Prempro 0.3/1.5 mg daily.  2. Viactiv two daily.  3. Multivitamin once daily.  4. Vitamin E 400 international units daily.  5. Aspirin 81 mg daily.  6. Fosamax 70 mg weekly.   EXAM:  GENERAL:  This is a thin, somewhat stoic woman who is in  no  distress on room air.  VITAL SIGNS:  Weight 106 pounds, which is stable from her recent visits.  Temperature 98.2, blood pressure 132/70, heart rate 82, SPO2 98% on room  air.  HEENT:  The oropharynx is clear.  She has some mild posterior pharyngeal  erythema and evidence of postnasal drip.  She is coughing throughout the  exam.  LUNGS:  Coarse without any wheezing.  HEART:  Regular without murmur.  ABDOMEN:  Benign.  EXTREMITIES:  Without cyanosis, clubbing or edema.   IMPRESSION:  1. Chronic cough.  It is likely being influenced by all the factors      below.  2. Bronchiectasis with questionable acute exacerbation given her      purulent sputum.  3. Possible acute versus chronic sinusitis.  4. Allergic rhinitis and postnasal drip.   PLANS:  1. I will treat her with Augmentin 875 mg b.i.d. for 14 days to treat      both the possible bronchiectasis flare and  also acute sinusitis.  2. I will send her sputum for bacterial, fungal and AFB organisms.  I      feel it will be important for [place] to clarify her colonizing      organisms in order to more adequately treat her in the future.  If      she does have recurrent MAC, then she would probably merit      eradication.  3. I will start her on Claritin 10 mg daily and Nasacort two sprays      each nostril daily to address any impact of postnasal drip.  4. She will follow up with me in 2-3 weeks.  At that time we may need      to consider starting bronchodilators as she does have airflow      limitations on her pulmonary function testing from September 2006.     Leslye Peer, MD  Electronically Signed    RSB/MedQ  DD: 10/25/2006  DT: 10/26/2006  Job #: [id number]   cc:   Hal T. Stoneking, M.D.

## 2010-08-16 NOTE — Assessment & Plan Note (Signed)
[facility]                             PULMONARY OFFICE NOTE   NAVA, SONG                  MRN:          [id number]  DATE:09/06/2006                            DOB:          20-Jun-1945    HISTORY:  This is a 65 year old, white female with documented  bronchiectasis associated with MAI previously who says she has been no  better since the cough started in 2005 and comes in today with  increasing cough and sense of congestion with green mucous for the last  2 weeks. She also complains of mild dyspnea. She says the cough tends to  be the worse around lunch time and around 6 p.m., but is not directly  tied to meals either before or after. She also notices the cough worse  when she lies down. She denies any pleuritic pain, fevers, chills,  sweats, orthopnea, PND or leg swelling.   Please see medication face sheet column dated September 06, 2006. It looks a  bit like a battlefield with the smoke still rising to me and note that  although she was asked to bring a list of her medicines with each visit  and has been supplied a medication calendar she refused to bring it and  also refused to see Tammy to have one generated again today.   PHYSICAL EXAMINATION:  GENERAL:  She has a hopeless, helpless, affect  and attitude.  VITAL SIGNS:  She had stable vital signs.  HEENT:  Unremarkable. Pharynx clear.  LUNGS:  Lung fields reveal a few inspiratory and expiratory rhonchi,  overall air movement is adequate  HEART:  She  has a regular rate and rhythm without murmur, gallop or  rub.  ABDOMEN:  Soft and benign.  EXTREMITIES:  Warm without calf tenderness, cyanosis, clubbing or edema.   IMPRESSION:  Chronic cough which may or may not be directly tied to the  bronchiectasis. She does not always cough up excess mucous and has been  maintained on Prempro and Fosamax, both of which can exacerbate reflux,  the most common cause of chronic cough and also a  common complication of  chronic cough in that coughing causes reflux.   She is convinced however she does not have reflux, it has nothing to do  with her problem and also is not willing to return for an algorithmic  approach to her chronic cough at this point.   All I could offer her therefore to treat her acutely today was Levaquin  750 for 5 days, Mucinex DM 2 b.i.d. p.r.n. cough and congestion and  refer her back to Dr. Delton Coombes for followup. I have advised her that if she  is going to be followed in this clinic, she will need to provide [place] an  accurate updated inventory of her medicines sorted out as to maintenance  versus p.r.n. so that  our nurses can get a better handle on what she is taking. This will  include all the medicines from every doctor she sees as well as over-the-  counter medications.     Charlaine Dalton. Sherene Sires, MD, [facility]  Electronically Signed  MBW/MedQ  DD: 09/07/2006  DT: 09/08/2006  Job #: [id number]   cc:   Hal T. Stoneking, M.D.  Leslye Peer, MD

## 2010-08-17 ENCOUNTER — Ambulatory Visit (INDEPENDENT_AMBULATORY_CARE_PROVIDER_SITE_OTHER): Payer: BC Managed Care – PPO

## 2010-08-17 DIAGNOSIS — J309 Allergic rhinitis, unspecified: Secondary | ICD-10-CM

## 2010-08-19 NOTE — Op Note (Signed)
[name], DECAPRIO           ACCOUNT NO.:  [id number]   MEDICAL RECORD NO.:  [id number]          PATIENT TYPE:  AMB   LOCATION:  DSC                          FACILITY:  [facility]   PHYSICIAN:  Currie Paris, M.D.DATE OF BIRTH:  19-Mar-1946   DATE OF PROCEDURE:  07/27/2006  DATE OF DISCHARGE:                               OPERATIVE REPORT   OFFICE MEDICAL RECORD NUMBER [id number].   PREOPERATIVE DIAGNOSIS:  Left breast calcifications, lower outer  quadrant.   POSTOPERATIVE DIAGNOSIS:  Left breast calcifications, lower outer  quadrant.   OPERATION:  Needle guided excision left breast calcifications.   SURGEON:  Currie Paris, M.D.   ANESTHESIA:  MAC.   CLINICAL HISTORY:  Ms. Mercer is a 65 year old lady who has had a  prior biopsy with some calcifications that were benign.  However, she  developed some new ones which were not amenable to a core biopsy.  Therefore excisional biopsy was recommended.   DESCRIPTION OF PROCEDURE:  The patient was seen in the holding area and  had no further questions.  The left breast was identified and marked by  the patient and myself as the operative site.  I reviewed the  preoperative guidewire localizing films.  The guidewire appeared  adjacent to the calcifications.  It was a few centimeters away from the  more posteriorly located clip from her prior biopsy that was benign.   The patient was taken to the operating room and after satisfactory IV  sedation, the left breast was prepped and draped.  The time-out  occurred.   I infiltrated 1% Xylocaine plain starting at the guidewire and going in  a radial fashion towards the areola which was the general direction of  the guidewire.  Once I had good local anesthesia, I made an incision  directly over the guidewires path starting at the guidewire and going  centrally towards the nipple areolar complex.  I divided a little of  subcutaneous tissue so I had the guidewire well into the  wound and could  see it entering into breast tissue.  With some gentle traction I was  then able to use the cautery and excise a nice cylinder of tissue around  the guidewire going until I was beyond the tip.  I thought this well  encompassed the calcifications.  We did get down to the chest wall at  the lateral part of the excision.   I oriented the specimen for pathology, using some sutures and marks and  then sent it for needle localizing films.   While waiting for that I infiltrated additional local.  I made sure  everything was dry and then closed with 3-0 Vicryls followed by 4-0  Monocryl subcuticular and Dermabond for the skin.   Dr. Manson Passey reported that the guidewire and calcifications were contained  in the specimen on the specimen mammogram.   The patient tolerated the procedure well.  There no operative  complications and all counts were correct.      Currie Paris, M.D.  Electronically Signed     CJS/MEDQ  D:  07/27/2006  T:  07/27/2006  Job:  [id number]   cc:   Gerrit Friends. Aldona Bar, M.D.  Donna Bernard, M.D.

## 2010-08-19 NOTE — Assessment & Plan Note (Signed)
Mary [name]                               PULMONARY OFFICE NOTE   Mary, [name]                  MRN:          [id number]  DATE:01/19/2006                            DOB:          1945-09-02    PULMONARY FOLLOWUP VISIT   I saw Mary [name] today for followup of her bronchiectasis as well as  Mycobacterium avium infection on eradication therapy.  She says that  approximately 1 week ago she developed flu-like symptoms as well as having  an increased cough with slightly more purulence.  She said she was also  feeling like she was running a low-grade fever.  She had decided to take her  prescription of Levaquin 500 mg daily and she says that up until yesterday  her symptoms were not improving.  She did also have some cough with some  blood streaking of her sputum.  However, today she says that she feels  significantly better and her cough is actually back to baseline.  She is not  having any difficulties as far as feeling short of breath, chest tightness  or wheezing.   CURRENT MEDICATIONS:  1. Prempro once daily.  2. Viactiv 2 pills daily.  3. One-a-Day vitamin.  4. Vitamin E.  5. Aspirin 81 mg daily.  6. Azithromycin 500 mg daily.  7. Ethambutol 800 mg daily.   PHYSICAL EXAMINATION:  VITAL SIGNS:  She is 180 pounds, temperature is 98.  Blood pressure 104/68.  Heart rate 68.  Oxygen saturation is 94% on room  air.  HEENT:  There was no sinus tenderness.  No nasal discharge.  No oral  lesions.  She had slight cerumen accumulation in her ears.  There was no  lymphadenopathy.  HEART:  S1, S2.  CHEST:  There was no wheezing.  She had fine rales heard more prominently at  the right base, which in review of her previous records, apparently was  present before.  ABDOMEN:  Soft, nontender.  EXTREMITIES:  No edema.   IMPRESSION:  1. Bronchiectasis.  She appears to have had another flare of this,      although symptomatically in my  office today she seems to have improved      with her course of Levaquin.  I do not think that she would need to      have a continuation of this course at the present time.  However, I      have given her a prescription for Levaquin 500 mg daily and have      advised her that if her symptoms do worsen in any way, she should start      to take the antibiotics and then call our office for repeat evaluation.      Additionally, I do not feel that she would need to have imaging studies      at this time.  2. Mycobacterium avium infection.  I will follow through with Mary [name]      previous plan of discontinuing her azithromycin and ethambutol and I      will make arrangements for her to undergo repeat  sputum sampling to      ascertain that her sputum is clear of Mycobacterium.  Then, I have made      arrangements for her to followup with Mary [name] in approximately 4      weeks.       Coralyn Helling, MD      VS/MedQ  DD:  01/19/2006  DT:  01/22/2006  Job #:  [id number]   cc:   Mary Picket A. Gerda Diss, MD  Mary [name], M.D.  Mary [name], M.D.

## 2010-08-19 NOTE — Op Note (Signed)
Mary [name], Mary [name]           ACCOUNT NO.:  [id number]   MEDICAL RECORD NO.:  [id number]          PATIENT TYPE:  AMB   LOCATION:  DAY                           FACILITY:  [facility]   PHYSICIAN:  R. Roetta Sessions, M.D. DATE OF BIRTH:  03/08/1946   DATE OF PROCEDURE:  12/16/2004  DATE OF DISCHARGE:                                 OPERATIVE REPORT   PROCEDURE:  Screening colonoscopy.   INDICATIONS FOR PROCEDURE:  The patient is a 65 year old lady sent over at  the courtesy of Dr. Gerda Diss for colorectal cancer screening. She is devoid of  any lower GI tract symptoms. No family history of colorectal neoplasia. She  has never had a colonoscopy. Colonoscopy is now being done as a screening  maneuver. This approach has been discussed with the patient at length.  Potential risks, benefits, and alternatives have been reviewed and questions  answered. She is agreeable. Please see documentation in the medical record.   PROCEDURE NOTE:  O2 saturation, blood pressure, pulse, and respirations were  monitored throughout the entire procedure. Conscious sedation with Versed 3  mg IV and Demerol 75 mg IV in divided doses.   INSTRUMENT:  Olympus video chip system.   FINDINGS:  Digital rectal exam revealed no abnormalities.   ENDOSCOPIC FINDINGS:  Prep was good.   Rectum:  Examination of the rectal mucosa including retroflexed view of the  anal verge revealed no abnormalities.   Colon:  Colonic mucosa was surveyed from the rectosigmoid junction through  the left, transverse, and right colon to the area of the appendiceal  orifice, ileocecal valve, and cecum. These structures were well seen and  photographed for the record. From this level, the scope was slowly  withdrawn, and all previously mentioned mucosal surfaces were again seen.  The patient was noted to have scattered left sided diverticula. The  remainder of the colonic mucosa ape pared normal. The patient tolerated the  procedure well and  reactive to endoscopy.   IMPRESSION:  1.  Normal rectum.  2.  Left sided diverticula. The remainder of the colonic mucosa ape pared      normal.   RECOMMENDATIONS:  1.  Diverticulosis literature provided to Ms. Loux.  2.  Repeat screening colonoscopy 10 years.      Jonathon Bellows, M.D.  Electronically Signed     RMR/MEDQ  D:  12/16/2004  T:  12/16/2004  Job:  [id number]   cc:   [facility]

## 2010-08-19 NOTE — Assessment & Plan Note (Signed)
[facility]                             PULMONARY OFFICE NOTE   Mary [name], Mary [name]                  MRN:          [id number]  DATE:02/16/2006                            DOB:          20-Apr-1945    SUBJECTIVE:  Ms. Son is a 65 year old woman with bronchiectasis  who has now completed 6 months of azithromycin and ethambutol in efforts  to eradicate her mycobacterium avium complex colonization.  She tells me  that she is feeling fairly well, although she does cough every day.  She  had some flu like symptoms in the days leading up to this appointment  but they have now resolved.  She denies and shortness of breath.  She  has not seen any blood tinged mucus.  She has not been using her flutter  valve recently because she does not have any difficulty mobilizing  secretions.  She continues to use the azithromycin and ethambutol,  although it would appear by the notes that she was asked by Dr. Craige Cotta to  stop these in October.   EXAMINATION:  GENERAL:  This is a thin woman who is in no distress.  Her  weight is 107 pounds, temperature 98, blood pressure 110/62, heart rate  66, SPO2 98% on room air.  LUNGS:  Significant for a few inspiratory crackles, but no wheezes.  HEENT:  Benign.  HEART:  Regular rate and rhythm without murmur.  ABDOMEN:  Benign.  EXTREMITIES:  No cyanosis, clubbing, or edema.   IMPRESSION:  1. Bronchiectasis.  2. Mycobacterium avium infection, status post 6 months of eradication      therapy.   PLAN:  1. Discontinue azithromycin and ethambutol.  2. Continue Levaquin on an as needed basis for any episodes of      purulent secretion production in the setting of bronchiectasis.  3. Flutter valve at least twice a day.  4. I will follow up with [name] in 3 months, at that time I      believe that we should recheck a computed tomography scan of the      chest and also recheck her sputum for acid-fast bacilli to  ensure      eradication.     Leslye Peer, MD  Electronically Signed    RSB/MedQ  DD: 03/30/2006  DT: 03/30/2006  Job #: [id number]   cc:   Hoy Register, M.D.  Scott A. Gerda Diss, MD

## 2010-08-19 NOTE — Consult Note (Signed)
[facility]                          ENDOCRINOLOGY CONSULTATION   Mary [name], Mary [name]                  MRN:          [id number]  DATE:05/14/2006                            DOB:          1945-08-30    ENDOCRINOLOGY CONSULTATION:   REFERRING PHYSICIAN:  Dr. Aldona Bar.   REASON FOR REFERRAL:  Osteoporosis.   HISTORY OF THE PRESENT ILLNESS:  A 65 year old woman who reports a 10-  year history of osteoporosis.  She has been on Fosamax for 8 years and  this was stopped last year due to a cough.  She denies any history of  bony fracture.  She also has no history of the following:  Liver  disease, seizure, DVT, urolithiasis or prolonged bedrest.  She denies  any prolonged steroid use.   PAST MEDICAL HISTORY:  1. Bronchiectasis.  2. Allergic rhinitis.  3. Menopause.  4. MAC.   MEDICATIONS:  Prempro, Vivactil, vitamin E supplement.   SOCIAL HISTORY:  She is married.  She works in an office setting.  Nonsmoker.  Nondrinker.   FAMILY HISTORY:  Her mother had osteoporosis.   REVIEW OF SYSTEMS:  Denies syncope.  Denies any change in her weight.  She denies falls.   PHYSICAL EXAMINATION:  Blood pressure 109/60, heart rate is 66,  temperature is 97.3.  The weight is 107.  GENERAL:  She is a lean middle-aged woman in no distress.  SKIN:  Normal texture and temperature.  HEENT:  No proptosis.  No periorbital swelling.  NECK:  Thyroid is normal.  CHEST:  Clear to auscultation.  No respiratory distress.  CARDIOVASCULAR:  No JVD.  No edema.  Regular rate and rhythm.  No  murmur.  Pedal pulses are intact.  MUSCULOSKELETAL:  On the upper extremities, I see no deformity of the  hands or fingers.  In the spine, I also see no deformity.  NEUROLOGIC:  Alert and well-oriented.  Does not appear anxious nor  depressed.   LABORATORY STUDIES:  Forwarded by Dr. Aldona Bar.  On May 01, 2006,  parathyroid hormone normal at 29.8.  Calcium is 9.2.  TSH 2.48.  25-  hydroxy vitamin D normal at 48 ng/mL.  CBC and CMET are normal.   I have also reviewed the bone densitometry reports done between 1999 and  2007, forwarded by Dr. Aldona Bar.   IMPRESSION:  1. Severe osteoporosis despite 8 years of Fosamax and being on hormone      replacement therapy.  2. Menopause for which she takes Prempro.   PLAN:  We discussed treatment options.  I have told her I do not think  it would do any good to take Fosamax any longer.  She is also on hormone  replacement therapy.  Her parathyroid hormone level tells [place] that  Rocaltrol would not be of any use here.  I have told her that by far the  best option here is Forteo.  She is very hesitant to start this  medication due to the daily injection, and its expense.  I have told her  of the value of improving her BMD and told her this is by far the  best  way to do it.  I have given her a prescription and advised her to  consider this and I discussed with her the risks of not taking it.  She  will think this over and if she describes to take the Forteo, she will  have the prescription filled and then come back and I will instruct her  in the use of the pen system.     Sean A. Everardo All, MD  Electronically Signed    SAE/MedQ  DD: 05/16/2006  DT: 05/16/2006  Job #: [id number]   cc:   Gerrit Friends. Aldona Bar, M.D.  Scott A. Gerda Diss, MD

## 2010-08-19 NOTE — Assessment & Plan Note (Signed)
[facility]                               PULMONARY OFFICE NOTE   Mary [name], Mary [name]                  MRN:          [id number]  DATE:10/31/2005                            DOB:          10/31/2005    PULMONARY FOLLOW-UP VISIT:   SUBJECTIVE:  Mary [name] is a 65 year old woman with bronchiectasis and  chronic purulent cough.  She has a MAC infection that did not respond to 6  months of therapy.  She was therefore restarted on azithromycin and  ethambutol and has been on this regimen since early June.  She tells me that  since her last visit the cough seems to be more frequent.  She is producing  green sputum.  She has not been using her flutter valve.  Her cough is worse  and most productive in the middle of the day.  She denies any dyspnea.   MEDICATIONS:  1.  Prempro one daily.  2.  Viactiv two daily.  3.  Multivitamin one daily.  4.  Vitamin E 400 international units daily.  5.  Aspirin 81 mg daily.  6.  Rhinocort-AQ one spray each nostril daily.  7.  Ethambutol 800 mg daily.  8.  Azithromycin 500 mg daily.   PHYSICAL EXAMINATION:  GENERAL:  This is a thin, somewhat depressed-  appearing woman who is exasperated with her cough.  VITAL SIGNS:  Weight 108 pounds, temperature 97.8, blood pressure 90/60,  heart rate 69, SPO2 96% on room air.  CHEST:  Her lungs have some mild bilateral inspiratory crackles.  There is  no wheezing.  CARDIAC:  Regular rate and rhythm without murmur.  ABDOMEN:  Soft and benign.  EXTREMITIES:  No cyanosis, clubbing or edema.   IMPRESSION:  1.  Bronchiectasis with persistent Mycobacterium avium complex infection.  2.  Allergic rhinitis.  3.  Probable bronchiectasis flare with suspected bacterial colonization and      infection beyond her Mycobacterium avium complex.   PLAN:  1.  Continue her ethambutol and azithromycin as ordered.  2.  Start Levaquin 500 mg daily for 10 days.  She will call me to let  me      know if she has a response in her sputum production and purulence.  3.  Use her flutter valve as prescribed in order to, hopefully, avoid the      persistent all-day need to clear secretions.  4.  A high-resolution CT scan of the chest to evaluate her airway disease      and parenchyma.  5.  Follow up in 2-3 weeks or sooner should she have any further difficulty.                                   Leslye Peer, MD   RSB/MedQ  DD:  10/31/2005  DT:  11/01/2005  Job #:  [id number]

## 2010-08-19 NOTE — Assessment & Plan Note (Signed)
[name]                             PULMONARY OFFICE NOTE   Mary [name], Mary [name]                  MRN:          [id number]  DATE:05/28/2006                            DOB:          10-30-1945    SUBJECTIVE:  Mary [name] is a 65 year old woman who followed for  bronchiectasis and Mycobacterium avium intracellulare complex infection.  She has completed therapy with azithromycin and ethambutol for 6 months  and her most recent sputum evaluation from December of 2007 was  negative.  She tells me that she continues to cough on a daily basis,  sometimes this causes chest wall soreness.  Her paroxysms of cough have  improved a bit and her cough is certainly much less productive.  She  feels like she has chronic postnasal drip and mucus running down the  back of her throat that irritates her upper airway and which leads to  the cough.  This type of cough would appear to be different from her  previous problematic cough which was more productive of plugs and mucus,  and appeared to be primarily due to her active bronchiectasis.   CURRENT MEDICATIONS:  1. Prempro 0.3 mg/1.5 mg daily.  2. Multivitamin daily.  3. Vitamin E 400 international units daily.  4. Aspirin 81 mg daily.  5. Fosamax 70 mg q. week.  6. Mucinex DM p.r.n.   EXAMINATION:  In general this is a pleasant, thin, well-appearing woman  in no distress.  Her weight is 108 pounds, temperature 98.1, blood  pressure 118/68, heart rate 71, SPO2 98% on room air.  HEENT:  Her posterior pharynx is slightly erythematous.  NECK:  Supple without lymphadenopathy or stridor.  LUNGS:  Clear to auscultation bilaterally, there is no wheezing heard.  HEART:  Regular rate and rhythm without murmur.  ABDOMEN:  Benign.  EXTREMITIES:  No cyanosis, clubbing, or edema.   IMPRESSION:  1. Bronchiectasis.  2. Mycobacterium avium complex infection with apparent eradication,      now off therapy.  3.  Allergic rhinitis and chronic postnasal drip with associated cough.   PLAN:  1. Will initiate nasal saline washes 1-2 times daily.  2. She will start over the counter Claritin.  3. We will perform a CT scan of her chest after our next visit to      evaluate her lung parenchyma following eradication of her MAC.  4. She has a levofloxacin prescription on reserve should she develop      symptoms consistent with an exacerbation of her bronchiectasis.  5. I will follow up with Mary [name] in 2 months or sooner should      she have any difficulties in the interim.     Leslye Peer, MD  Electronically Signed    RSB/MedQ  DD: 05/29/2006  DT: 05/29/2006  Job #: [id number]   cc:   Gerrit Friends. Aldona Bar, M.D.  Scott A. Gerda Diss, MD  Cleophas Dunker Everardo All, MD

## 2010-08-19 NOTE — Assessment & Plan Note (Signed)
[facility]                               PULMONARY OFFICE NOTE   Mary [name], Mary [name]                  MRN:          [id number]  DATE:11/21/2005                            DOB:          [birth year]    SUBJECTIVE:  Mary [name] is a 65 year old woman with history of  bronchiectasis with associated chronic cough.  She also has a Mycobacterium  avium complex infection and is on eradication therapy for this.  She was  seen at the end of July for an exacerbation of her bronchiectasis.  She was  treated with a course of levofloxacin.  Since taking that antibiotic, her  sputum production has decreased and the purulence has improved.  She still  does make significant sputum each day but it is back to her former baseline.  While she was on the levofloxacin she felt weak and drained.  She now is  being to feel more like herself.  She is using her flutter valve primarily  at night to clear secretions before bedtime.  She usually has an episode in  the middle of the day where she has a lot of coughing and secretion  clearance.   MEDICATIONS:  1. Prempro.  2. Viactiv.  3. Multivitamin.  4. Vitamin E.  5. Aspirin 81 mg daily.  6. Azithromycin 500 mg daily.  7. ethambutol 800 mg daily.   EXAMINATION:  GENERAL:  This thin woman who interacts appropriately.  LUNGS:  A few right-sided basilar crackles.  She is otherwise clear.  She  does not have a wheezing on forced expiration.  Her exam is otherwise  unchanged from our previous visit.   A repeat high-resolution CT scan of the chest was performed on November 02, 2005.  This showed no interval change in her parenchymal findings,  consistent with her MAC.  She has mid and lower lobe bronchiectatic changes.  A right lower lobe small pulmonary nodule measuring 6 mm was unchanged.   IMPRESSION:  1. Bronchiectasis with recent flare.  2. Mycobacterium avium infection, on eradication therapy.   PLAN:  1.  Continue ethambutol and azithromycin.  2. Continue flutter valve on a daily basis to facilitate secretion      management and clearance.  3. We may need to cycle her antibiotics if her exacerbations become      frequent.  I believe that this therapy can be guided by sputum samples      and microbiological studies to assess her colonizing flora.  We may be      able to use an alternative to levofloxacin in the future since she felt      significant side effects on this medication.  4. We will stop her MAC antibiotic therapy in November 2007 and repeat her      AFB cultures x3 to look for clearance.                                   Leslye Peer, MD   RSB/MedQ  DD:  12/06/2005  DT:  12/06/2005  Job #:  [id number]   cc:   Lorin Picket A. Gerda Diss, MD  Gerda Diss, MD  Hoy Register, M.D.

## 2010-08-24 ENCOUNTER — Ambulatory Visit (INDEPENDENT_AMBULATORY_CARE_PROVIDER_SITE_OTHER): Payer: BC Managed Care – PPO

## 2010-08-24 DIAGNOSIS — J309 Allergic rhinitis, unspecified: Secondary | ICD-10-CM

## 2010-08-31 ENCOUNTER — Ambulatory Visit (INDEPENDENT_AMBULATORY_CARE_PROVIDER_SITE_OTHER): Payer: BC Managed Care – PPO

## 2010-08-31 DIAGNOSIS — J309 Allergic rhinitis, unspecified: Secondary | ICD-10-CM

## 2010-09-05 ENCOUNTER — Ambulatory Visit: Payer: BC Managed Care – PPO | Admitting: Internal Medicine

## 2010-09-05 ENCOUNTER — Encounter: Payer: Self-pay | Admitting: Internal Medicine

## 2010-09-07 ENCOUNTER — Ambulatory Visit (INDEPENDENT_AMBULATORY_CARE_PROVIDER_SITE_OTHER): Payer: BC Managed Care – PPO

## 2010-09-07 DIAGNOSIS — J309 Allergic rhinitis, unspecified: Secondary | ICD-10-CM

## 2010-09-14 ENCOUNTER — Ambulatory Visit (INDEPENDENT_AMBULATORY_CARE_PROVIDER_SITE_OTHER): Payer: BC Managed Care – PPO

## 2010-09-14 DIAGNOSIS — J309 Allergic rhinitis, unspecified: Secondary | ICD-10-CM

## 2010-09-21 ENCOUNTER — Ambulatory Visit (INDEPENDENT_AMBULATORY_CARE_PROVIDER_SITE_OTHER): Payer: BC Managed Care – PPO

## 2010-09-21 DIAGNOSIS — J309 Allergic rhinitis, unspecified: Secondary | ICD-10-CM

## 2010-09-22 ENCOUNTER — Ambulatory Visit: Payer: BC Managed Care – PPO | Admitting: Internal Medicine

## 2010-09-23 ENCOUNTER — Ambulatory Visit: Payer: BC Managed Care – PPO | Admitting: Internal Medicine

## 2010-09-28 ENCOUNTER — Ambulatory Visit (INDEPENDENT_AMBULATORY_CARE_PROVIDER_SITE_OTHER): Payer: BC Managed Care – PPO

## 2010-09-28 DIAGNOSIS — J309 Allergic rhinitis, unspecified: Secondary | ICD-10-CM

## 2010-10-04 ENCOUNTER — Ambulatory Visit (INDEPENDENT_AMBULATORY_CARE_PROVIDER_SITE_OTHER): Payer: BC Managed Care – PPO

## 2010-10-04 DIAGNOSIS — J309 Allergic rhinitis, unspecified: Secondary | ICD-10-CM

## 2010-10-07 ENCOUNTER — Ambulatory Visit: Payer: BC Managed Care – PPO | Admitting: Internal Medicine

## 2010-10-12 ENCOUNTER — Ambulatory Visit (INDEPENDENT_AMBULATORY_CARE_PROVIDER_SITE_OTHER): Payer: BC Managed Care – PPO

## 2010-10-12 DIAGNOSIS — J309 Allergic rhinitis, unspecified: Secondary | ICD-10-CM

## 2010-10-19 ENCOUNTER — Encounter: Payer: Self-pay | Admitting: Internal Medicine

## 2010-10-19 ENCOUNTER — Ambulatory Visit (INDEPENDENT_AMBULATORY_CARE_PROVIDER_SITE_OTHER): Payer: BC Managed Care – PPO

## 2010-10-19 ENCOUNTER — Ambulatory Visit (INDEPENDENT_AMBULATORY_CARE_PROVIDER_SITE_OTHER): Payer: BC Managed Care – PPO | Admitting: Internal Medicine

## 2010-10-19 VITALS — BP 102/58 | HR 74 | Ht 65.0 in | Wt 104.8 lb

## 2010-10-19 DIAGNOSIS — J301 Allergic rhinitis due to pollen: Secondary | ICD-10-CM

## 2010-10-19 DIAGNOSIS — J309 Allergic rhinitis, unspecified: Secondary | ICD-10-CM

## 2010-10-19 DIAGNOSIS — A318 Other mycobacterial infections: Secondary | ICD-10-CM

## 2010-10-19 NOTE — Patient Instructions (Addendum)
We will notify the allergy lab to increase your vaccine strength to 1:10 next time you order.  Please let us know if there are problems or questions.

## 2010-10-19 NOTE — Assessment & Plan Note (Addendum)
Sumptom surge in Spring, c/w seasonal allergy, despite her low scores on Allergy Profile She is doing fairly well. We discussed allergic vs non-allergic symptoms again. We can move her up now to 1:10.

## 2010-10-19 NOTE — Progress Notes (Signed)
Subjective:    Patient ID: Mary [name], female    DOB: 12-21-1945, 65 y.o.   MRN: [id number]  HPI    Review of Systems     Objective:   Physical Exam        Assessment & Plan:   Subjective:    Patient ID: Mary [name], female    DOB: 10-18-1945, 65 y.o.   MRN: [id number]  HPI Mary [name] is a 65 year old woman whom I have followed for chronic cough. 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 Evaluated 2011 by Dr Maple Hudson for allergies - shows multiple sensitivities that are present year round.   ROV 07/12/09 -- returns for f/u. Tells me that her PND has actually improved since last visit. Her regimen is benadryl 25mg  two times a day, NSW two times a day, flutter valve two times a day - sometimes productive but often not. Also on rotating doxy + azithromycin. No flares of bronchiectasis since last visit. She is contemplating starting allergy shots, hopefully can get them from her PCP.   ROV 10/01/09 -- f/u bronchiectasis, cough. Flare that started about a week ago. Began treatment with Avelox and set up this follow up. Was exposed to husband who had a URI. Changed her benadryl to Claritin D. She has not started allergy shots yet. Starting to feel better, not back to baseline. never started Singulair.   ROV 01/24/10 -- Hx chronic cough, bronchiectasis. Tried daliresp, couldn't tolerate due to insomnia. Still with clear drainage, never responded to treatment with nasal steroid. On benadryl qhs. Taking alternating doxy and azithro monthly. Considering starting allergy shots - discussing w Dr Maple Hudson.   ROV 08/05/10 -- bronchiectasis, chronic cough, severe allergic rhinitis. Doing NSW qd and flutter qd.  She is on allergy shots and nasal gtt may be better. She is alternating doxy with azithro at the beginning of every month. Her cough is predicatble - morning and afternoon. Worst if she has been off abx for an extended  period. Last CT scan was 2008.   10/19/10-  65 yo F  Never smoker followed for bronchiectasis/ hx M.avium, chronic cough, severe allergic rhinitis Started allergy vaccine in January, now at 1:50 with occasional minimal local soreness but no problems. Says the spring pollen season was "rough"- increased nasal stuffiness and cough. Has been better in last 2 months. Still takes benadryl once each night. Hx of trying many nasal sprays. Still some nasal congestion and drip at times. She is interested in getting her shots in [place] at her PCP. allergy profile- IgE 2.4, negative common allergens. Negative Fungal antibody panel  Review of Systems As per HPI   Constitutional:   No-   weight loss, night sweats, fevers, chills, fatigue, lassitude. HEENT:   No-   headaches, difficulty swallowing, tooth/dental problems, sore throat,                  No-   sneezing, itching, ear ache,   CV:  No-   chest pain, orthopnea, PND, swelling in lower extremities, anasarca, dizziness, palpitations  GI:  No-   heartburn, indigestion, abdominal pain, nausea, vomiting, diarrhea,                 change in bowel habits, loss of appetite  Resp: No-   shortness of breath with exertion or at rest.  No-  excess mucus,             No-  productive cough,  No non-productive cough,  No-  coughing up of blood.              No-   change in color of mucus.  No- wheezing.    Skin: No-   rash or lesions.  GU: No-   dysuria, change in color of urine, no urgency or frequency.  No- flank pain.  MS:  No-   joint pain or swelling.  No- decreased range of motion.  No- back pain.  Psych:  No- change in mood or affect. No depression or anxiety.  No memory loss.   Objective:   Physical Exam Gen: Pleasant, well-nourished, in no distress,  normal affect  ENT: No lesions,  mouth clear,  oropharynx clear, no postnasal drip. Mild mucus bridging.  Neck: No JVD, no TMG, no carotid bruits  Lungs: clear  Bilaterally  Cardiovascular: RRR, heart sounds normal, no murmur or gallops, no peripheral edema  Musculoskeletal: No deformities, no cyanosis or clubbing  Neuro: alert, non focal  Skin: Warm, no lesions or rashes       Assessment & Plan:

## 2010-10-20 ENCOUNTER — Encounter: Payer: Self-pay | Admitting: Internal Medicine

## 2010-10-22 ENCOUNTER — Encounter: Payer: Self-pay | Admitting: Internal Medicine

## 2010-10-22 NOTE — Assessment & Plan Note (Signed)
Need to research old records- bacteremia vs bronchitis. Latest sputum cx 02/23/10- neg for AFB, fungal

## 2010-10-26 ENCOUNTER — Ambulatory Visit (INDEPENDENT_AMBULATORY_CARE_PROVIDER_SITE_OTHER): Payer: BC Managed Care – PPO

## 2010-10-26 DIAGNOSIS — J309 Allergic rhinitis, unspecified: Secondary | ICD-10-CM

## 2010-11-02 ENCOUNTER — Ambulatory Visit (INDEPENDENT_AMBULATORY_CARE_PROVIDER_SITE_OTHER): Payer: BC Managed Care – PPO

## 2010-11-02 DIAGNOSIS — J309 Allergic rhinitis, unspecified: Secondary | ICD-10-CM

## 2010-11-09 ENCOUNTER — Ambulatory Visit (INDEPENDENT_AMBULATORY_CARE_PROVIDER_SITE_OTHER): Payer: BC Managed Care – PPO

## 2010-11-09 DIAGNOSIS — J309 Allergic rhinitis, unspecified: Secondary | ICD-10-CM

## 2010-11-16 ENCOUNTER — Ambulatory Visit (INDEPENDENT_AMBULATORY_CARE_PROVIDER_SITE_OTHER): Payer: Medicare Other

## 2010-11-16 DIAGNOSIS — J309 Allergic rhinitis, unspecified: Secondary | ICD-10-CM

## 2010-11-18 ENCOUNTER — Ambulatory Visit (INDEPENDENT_AMBULATORY_CARE_PROVIDER_SITE_OTHER): Payer: Medicare Other

## 2010-11-18 DIAGNOSIS — J309 Allergic rhinitis, unspecified: Secondary | ICD-10-CM

## 2010-11-23 ENCOUNTER — Ambulatory Visit (INDEPENDENT_AMBULATORY_CARE_PROVIDER_SITE_OTHER): Payer: Medicare Other

## 2010-11-23 DIAGNOSIS — J309 Allergic rhinitis, unspecified: Secondary | ICD-10-CM

## 2010-11-30 ENCOUNTER — Ambulatory Visit (INDEPENDENT_AMBULATORY_CARE_PROVIDER_SITE_OTHER): Payer: Medicare Other

## 2010-11-30 DIAGNOSIS — J309 Allergic rhinitis, unspecified: Secondary | ICD-10-CM

## 2010-12-07 ENCOUNTER — Ambulatory Visit (INDEPENDENT_AMBULATORY_CARE_PROVIDER_SITE_OTHER): Payer: Medicare Other

## 2010-12-07 DIAGNOSIS — J309 Allergic rhinitis, unspecified: Secondary | ICD-10-CM

## 2010-12-21 ENCOUNTER — Ambulatory Visit (INDEPENDENT_AMBULATORY_CARE_PROVIDER_SITE_OTHER): Payer: Medicare Other

## 2010-12-21 DIAGNOSIS — J309 Allergic rhinitis, unspecified: Secondary | ICD-10-CM

## 2010-12-22 LAB — AFB CULTURE WITH SMEAR (NOT AT ARMC): Acid Fast Smear: NONE SEEN

## 2010-12-22 LAB — CULTURE, RESPIRATORY W GRAM STAIN

## 2010-12-22 LAB — FUNGUS CULTURE W SMEAR

## 2010-12-28 ENCOUNTER — Ambulatory Visit (INDEPENDENT_AMBULATORY_CARE_PROVIDER_SITE_OTHER): Payer: Medicare Other

## 2010-12-28 ENCOUNTER — Ambulatory Visit: Payer: BC Managed Care – PPO | Admitting: Internal Medicine

## 2010-12-28 DIAGNOSIS — J309 Allergic rhinitis, unspecified: Secondary | ICD-10-CM

## 2011-01-03 ENCOUNTER — Ambulatory Visit (INDEPENDENT_AMBULATORY_CARE_PROVIDER_SITE_OTHER): Payer: Medicare Other

## 2011-01-03 DIAGNOSIS — J309 Allergic rhinitis, unspecified: Secondary | ICD-10-CM

## 2011-01-04 ENCOUNTER — Ambulatory Visit: Payer: BC Managed Care – PPO | Admitting: Internal Medicine

## 2011-01-11 ENCOUNTER — Ambulatory Visit (INDEPENDENT_AMBULATORY_CARE_PROVIDER_SITE_OTHER): Payer: Medicare Other

## 2011-01-11 DIAGNOSIS — J309 Allergic rhinitis, unspecified: Secondary | ICD-10-CM

## 2011-01-18 ENCOUNTER — Ambulatory Visit (INDEPENDENT_AMBULATORY_CARE_PROVIDER_SITE_OTHER): Payer: Medicare Other

## 2011-01-18 DIAGNOSIS — J309 Allergic rhinitis, unspecified: Secondary | ICD-10-CM

## 2011-01-20 ENCOUNTER — Ambulatory Visit (INDEPENDENT_AMBULATORY_CARE_PROVIDER_SITE_OTHER): Payer: Medicare Other | Admitting: Internal Medicine

## 2011-01-20 ENCOUNTER — Encounter: Payer: Self-pay | Admitting: Internal Medicine

## 2011-01-20 VITALS — BP 120/70 | HR 84 | Ht 65.0 in | Wt 106.6 lb

## 2011-01-20 DIAGNOSIS — J301 Allergic rhinitis due to pollen: Secondary | ICD-10-CM

## 2011-01-20 NOTE — Patient Instructions (Signed)
I will have the allergy lab advance your vaccine to the 1:10 strength  Try sample Patanase nasal antihistamine spray    1-2 puffs each nostril, up to twice daily as needed. You can also take your benadryl as needed.

## 2011-01-20 NOTE — Progress Notes (Signed)
Patient ID: Mary [name], female    DOB: 10/05/1945, 65 y.o.   MRN: [id number]  HPI Mary [name] is a 65 year old woman whom I have followed for chronic cough. 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 Evaluated 2011 by Mary [name] for allergies - shows multiple sensitivities that are present year round.   ROV 07/12/09 -- returns for f/u. Tells me that her PND has actually improved since last visit. Her regimen is benadryl 25mg  two times a day, NSW two times a day, flutter valve two times a day - sometimes productive but often not. Also on rotating doxy + azithromycin. No flares of bronchiectasis since last visit. She is contemplating starting allergy shots, hopefully can get them from her PCP.   ROV 10/01/09 -- f/u bronchiectasis, cough. Flare that started about a week ago. Began treatment with Avelox and set up this follow up. Was exposed to husband who had a URI. Changed her benadryl to Claritin D. She has not started allergy shots yet. Starting to feel better, not back to baseline. never started Singulair.   ROV 01/24/10 -- Hx chronic cough, bronchiectasis. Tried daliresp, couldn't tolerate due to insomnia. Still with clear drainage, never responded to treatment with nasal steroid. On benadryl qhs. Taking alternating doxy and azithro monthly. Considering starting allergy shots - discussing w Mary [name].   ROV 08/05/10 -- bronchiectasis, chronic cough, severe allergic rhinitis. Doing NSW qd and flutter qd.  She is on allergy shots and nasal gtt may be better. She is alternating doxy with azithro at the beginning of every month. Her cough is predicatble - morning and afternoon. Worst if she has been off abx for an extended period. Last CT scan was 2008.   10/19/10-  65 yo F  Never smoker followed for bronchiectasis/ hx M.avium, chronic cough, severe allergic rhinitis Started allergy vaccine in January, now at 1:50 with occasional  minimal local soreness but no problems. Says the spring pollen season was "rough"- increased nasal stuffiness and cough. Has been better in last 2 months. Still takes benadryl once each night. Hx of trying many nasal sprays. Still some nasal congestion and drip at times. She is interested in getting her shots in [place] at her PCP. allergy profile- IgE 2.4, negative common allergens. Negative Fungal antibody panel  01/20/11-  65 yo F  Never smoker followed for bronchiectasis/ hx M.avium, chronic cough (Mary [name]), severe allergic rhinitis She returns for followup of her allergy vaccine therapy begun in January 2012. She says she had 2 good weeks at the beginning of September but now feels nasal congestion in the morning with watery rhinorrhea later in the day "like a cold". She has not been taking antihistamines very often, but prefers Benadryl. Cough is productive of clear to yellow sputum with no blood or fever. Mary [name] is alternating antibiotics. Flu shot is pending with her primary physician  Review of Systems As per HPI Constitutional:   No-   weight loss, night sweats, fevers, chills, fatigue, lassitude. HEENT:   No-  headaches, difficulty swallowing, tooth/dental problems, sore throat,       No-  sneezing, itching, ear ache,   +nasal congestion, post nasal drip,  CV:  No-   chest pain, orthopnea, PND, swelling in lower extremities, anasarca, dizziness, palpitations Resp: No-   shortness of breath with exertion or at rest.             Some chronic productive cough,  No non-productive cough,  No- coughing up of blood.              No-   change in color of mucus.  No- wheezing.   Skin: No-   rash or lesions. GI:  No-   heartburn, indigestion, abdominal pain, nausea, vomiting, diarrhea,                 change in bowel habits, loss of appetite GU: No-   dysuria, change in color of urine, no urgency or frequency.  No- flank pain. MS:  No-   joint pain or swelling.  No- decreased range of  motion.  No- back pain. Neuro-     nothing unusual Psych:  No- change in mood or affect. No depression or anxiety.  No memory loss.   Objective:  General- Alert, Oriented, Affect-appropriate, Distress- none acute, thin Skin- rash-none, lesions- none, excoriation- none Lymphadenopathy- none Head- atraumatic            Eyes- Gross vision intact, PERRLA, conjunctivae clear secretions            Ears- Hearing, canals-normal            Nose- Clear- mucosa maybe a little pale and puffy but not remarkable, no-Septal dev, mucus, polyps, erosion, perforation             Throat- Mallampati II , mucosa clear , drainage- none, tonsils- atrophic Neck- flexible , trachea midline, no stridor , thyroid nl, carotid no bruit Chest - symmetrical excursion , unlabored           Heart/CV- RRR , no murmur , no gallop  , no rub, nl s1 s2                           - JVD- none , edema- none, stasis changes- none, varices- none           Lung- clear to P&A- no rhonchi, wheeze- none, cough- none , dullness-none, rub- none           Chest wall-  Abd- tender-no, distended-no, bowel sounds-present, HSM- no Br/ Gen/ Rectal- Not done, not indicated Extrem- cyanosis- none, clubbing, none, atrophy- none, strength- nl Neuro- grossly intact to observation

## 2011-01-22 NOTE — Assessment & Plan Note (Signed)
Mild rhinitis symptoms right now are nonspecific but coincide with the season change. We discussed use of antihistamines and decongestants. Unlabored t try a sample of Patanase and we may want to consider a trial of ipratropium nasal spray if rhinorrhea is the major complaint. We can advance her vaccine now to 1:10.

## 2011-01-25 ENCOUNTER — Ambulatory Visit (INDEPENDENT_AMBULATORY_CARE_PROVIDER_SITE_OTHER): Payer: Medicare Other

## 2011-01-25 DIAGNOSIS — J309 Allergic rhinitis, unspecified: Secondary | ICD-10-CM

## 2011-02-01 ENCOUNTER — Ambulatory Visit (INDEPENDENT_AMBULATORY_CARE_PROVIDER_SITE_OTHER): Payer: Medicare Other

## 2011-02-01 DIAGNOSIS — J309 Allergic rhinitis, unspecified: Secondary | ICD-10-CM

## 2011-02-08 ENCOUNTER — Ambulatory Visit (INDEPENDENT_AMBULATORY_CARE_PROVIDER_SITE_OTHER): Payer: Medicare Other

## 2011-02-08 DIAGNOSIS — J309 Allergic rhinitis, unspecified: Secondary | ICD-10-CM

## 2011-02-15 ENCOUNTER — Encounter: Payer: Self-pay | Admitting: Internal Medicine

## 2011-02-15 ENCOUNTER — Ambulatory Visit (INDEPENDENT_AMBULATORY_CARE_PROVIDER_SITE_OTHER): Payer: Medicare Other

## 2011-02-15 DIAGNOSIS — J309 Allergic rhinitis, unspecified: Secondary | ICD-10-CM

## 2011-02-22 ENCOUNTER — Ambulatory Visit (INDEPENDENT_AMBULATORY_CARE_PROVIDER_SITE_OTHER): Payer: Medicare Other

## 2011-02-22 DIAGNOSIS — J309 Allergic rhinitis, unspecified: Secondary | ICD-10-CM

## 2011-03-01 ENCOUNTER — Ambulatory Visit (INDEPENDENT_AMBULATORY_CARE_PROVIDER_SITE_OTHER): Payer: Medicare Other

## 2011-03-01 DIAGNOSIS — J309 Allergic rhinitis, unspecified: Secondary | ICD-10-CM

## 2011-03-08 ENCOUNTER — Ambulatory Visit (INDEPENDENT_AMBULATORY_CARE_PROVIDER_SITE_OTHER): Payer: Medicare Other

## 2011-03-08 DIAGNOSIS — J309 Allergic rhinitis, unspecified: Secondary | ICD-10-CM

## 2011-03-15 ENCOUNTER — Ambulatory Visit (INDEPENDENT_AMBULATORY_CARE_PROVIDER_SITE_OTHER): Payer: Medicare Other

## 2011-03-15 DIAGNOSIS — J309 Allergic rhinitis, unspecified: Secondary | ICD-10-CM

## 2011-03-16 ENCOUNTER — Ambulatory Visit (INDEPENDENT_AMBULATORY_CARE_PROVIDER_SITE_OTHER): Payer: Medicare Other

## 2011-03-16 DIAGNOSIS — J309 Allergic rhinitis, unspecified: Secondary | ICD-10-CM

## 2011-03-22 ENCOUNTER — Ambulatory Visit (INDEPENDENT_AMBULATORY_CARE_PROVIDER_SITE_OTHER): Payer: Medicare Other

## 2011-03-22 ENCOUNTER — Ambulatory Visit: Payer: Medicare Other | Admitting: Emergency Medicine

## 2011-03-22 DIAGNOSIS — J309 Allergic rhinitis, unspecified: Secondary | ICD-10-CM

## 2011-03-29 ENCOUNTER — Ambulatory Visit (INDEPENDENT_AMBULATORY_CARE_PROVIDER_SITE_OTHER): Payer: Medicare Other

## 2011-03-29 DIAGNOSIS — J309 Allergic rhinitis, unspecified: Secondary | ICD-10-CM

## 2011-04-05 ENCOUNTER — Ambulatory Visit (INDEPENDENT_AMBULATORY_CARE_PROVIDER_SITE_OTHER): Payer: Medicare Other

## 2011-04-05 DIAGNOSIS — J309 Allergic rhinitis, unspecified: Secondary | ICD-10-CM

## 2011-04-12 ENCOUNTER — Ambulatory Visit (INDEPENDENT_AMBULATORY_CARE_PROVIDER_SITE_OTHER): Payer: Medicare Other

## 2011-04-12 DIAGNOSIS — J309 Allergic rhinitis, unspecified: Secondary | ICD-10-CM

## 2011-04-19 ENCOUNTER — Ambulatory Visit (INDEPENDENT_AMBULATORY_CARE_PROVIDER_SITE_OTHER): Payer: Medicare Other

## 2011-04-19 DIAGNOSIS — J309 Allergic rhinitis, unspecified: Secondary | ICD-10-CM | POA: Diagnosis not present

## 2011-04-26 ENCOUNTER — Ambulatory Visit (INDEPENDENT_AMBULATORY_CARE_PROVIDER_SITE_OTHER): Payer: Medicare Other

## 2011-04-26 DIAGNOSIS — J309 Allergic rhinitis, unspecified: Secondary | ICD-10-CM | POA: Diagnosis not present

## 2011-05-05 ENCOUNTER — Encounter: Payer: Self-pay | Admitting: Emergency Medicine

## 2011-05-05 ENCOUNTER — Ambulatory Visit (INDEPENDENT_AMBULATORY_CARE_PROVIDER_SITE_OTHER): Payer: Medicare Other

## 2011-05-05 ENCOUNTER — Ambulatory Visit (INDEPENDENT_AMBULATORY_CARE_PROVIDER_SITE_OTHER): Payer: Medicare Other | Admitting: Emergency Medicine

## 2011-05-05 VITALS — BP 112/70 | HR 65 | Temp 98.1°F | Ht 65.0 in | Wt 105.6 lb

## 2011-05-05 DIAGNOSIS — J309 Allergic rhinitis, unspecified: Secondary | ICD-10-CM | POA: Diagnosis not present

## 2011-05-05 DIAGNOSIS — J479 Bronchiectasis, uncomplicated: Secondary | ICD-10-CM | POA: Diagnosis not present

## 2011-05-05 NOTE — Assessment & Plan Note (Signed)
Poor to worsening control - suspect she remians colonized by either [facility] or MRSA.  - sputum cx now - change abx based on the above - hold off on CT scan for now - consider FOB if sputum unrevealing

## 2011-05-05 NOTE — Patient Instructions (Signed)
We will send sputum for culture and smears.  Once we have this information we will change your antibiotics to treat Follow with Dr Delton Coombes in 1 month

## 2011-05-05 NOTE — Progress Notes (Signed)
Patient ID: Mary [name], female    DOB: [birth year], 66 y.o.   MRN: [id number]  HPI Mary [name] is a 66 year old woman whom I have followed for chronic cough. 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 Evaluated 2011 by Dr Maple Hudson for allergies - shows multiple sensitivities that are present year round.   ROV 07/12/09 -- returns for f/u. Tells me that her PND has actually improved since last visit. Her regimen is benadryl 25mg  two times a day, NSW two times a day, flutter valve two times a day - sometimes productive but often not. Also on rotating doxy + azithromycin. No flares of bronchiectasis since last visit. She is contemplating starting allergy shots, hopefully can get them from her PCP.   ROV 10/01/09 -- f/u bronchiectasis, cough. Flare that started about a week ago. Began treatment with Avelox and set up this follow up. Was exposed to husband who had a URI. Changed her benadryl to Claritin D. She has not started allergy shots yet. Starting to feel better, not back to baseline. never started Singulair.   ROV 01/24/10 -- Hx chronic cough, bronchiectasis. Tried daliresp, couldn't tolerate due to insomnia. Still with clear drainage, never responded to treatment with nasal steroid. On benadryl qhs. Taking alternating doxy and azithro monthly. Considering starting allergy shots - discussing w Dr Maple Hudson.   ROV 08/05/10 -- bronchiectasis, chronic cough, severe allergic rhinitis. Doing NSW qd and flutter qd.  She is on allergy shots and nasal gtt may be better. She is alternating doxy with azithro at the beginning of every month. Her cough is predicatble - morning and afternoon. Worst if she has been off abx for an extended period. Last CT scan was 2008.   10/19/10-  2 yo F  Never smoker followed for bronchiectasis/ hx M.avium, chronic cough, severe allergic rhinitis Started allergy vaccine in January, now at 1:50 with occasional  minimal local soreness but no problems. Says the spring pollen season was "rough"- increased nasal stuffiness and cough. Has been better in last 2 months. Still takes benadryl once each night. Hx of trying many nasal sprays. Still some nasal congestion and drip at times. She is interested in getting her shots in [place] at her PCP. allergy profile- IgE 2.4, negative common allergens. Negative Fungal antibody panel  01/20/11-  66 yo F  Never smoker followed for bronchiectasis/ hx M.avium, chronic cough (Dr Delton Coombes), severe allergic rhinitis She returns for followup of her allergy vaccine therapy begun in January 2012. She says she had 2 good weeks at the beginning of September but now feels nasal congestion in the morning with watery rhinorrhea later in the day "like a cold". She has not been taking antihistamines very often, but prefers Benadryl. Cough is productive of clear to yellow sputum with no blood or fever. Dr Delton Coombes is alternating antibiotics. Flu shot is pending with her primary physician  ROV 05/05/11 -- bronchiectasis, chronic cough, severe allergic rhinitis. Hx MAIC and also MRSA. She has started allergy shots w Dr Maple Hudson. She still has congestion and drainage, assoc w dry cough. She is alternating abx (doxy/azithro) beginning every month. The cough and sputum is bad between abx and she often has to start early. Doxy is better than azithro, but it is taking longer for it to be effective. She is wondering if she might be having some reflux.   Objective:    Gen: Pleasant, thin, in no distress,  normal affect  ENT: No lesions,  mouth clear,  oropharynx clear, no postnasal drip  Neck: No JVD, no TMG, no carotid bruits  Lungs: No use of accessory muscles, no dullness to percussion, clear without rales or rhonchi  Cardiovascular: RRR, heart sounds normal, no murmur or gallops, no peripheral edema  Musculoskeletal: No deformities, no cyanosis or clubbing  Neuro: alert, non focal  Skin:  Warm, no lesions or rashes  BRONCHIECTASIS Poor to worsening control - suspect she remians colonized by either [facility] or MRSA.  - sputum cx now - change abx based on the above - hold off on CT scan for now - consider FOB if sputum unrevealing

## 2011-05-10 ENCOUNTER — Ambulatory Visit (INDEPENDENT_AMBULATORY_CARE_PROVIDER_SITE_OTHER): Payer: Medicare Other

## 2011-05-10 DIAGNOSIS — J309 Allergic rhinitis, unspecified: Secondary | ICD-10-CM | POA: Diagnosis not present

## 2011-05-12 ENCOUNTER — Encounter: Payer: Self-pay | Admitting: Internal Medicine

## 2011-05-17 ENCOUNTER — Other Ambulatory Visit: Payer: Medicare Other

## 2011-05-17 ENCOUNTER — Other Ambulatory Visit: Payer: Self-pay | Admitting: Emergency Medicine

## 2011-05-17 ENCOUNTER — Ambulatory Visit (INDEPENDENT_AMBULATORY_CARE_PROVIDER_SITE_OTHER): Payer: Medicare Other

## 2011-05-17 DIAGNOSIS — J309 Allergic rhinitis, unspecified: Secondary | ICD-10-CM

## 2011-05-17 DIAGNOSIS — J479 Bronchiectasis, uncomplicated: Secondary | ICD-10-CM

## 2011-05-24 ENCOUNTER — Ambulatory Visit (INDEPENDENT_AMBULATORY_CARE_PROVIDER_SITE_OTHER): Payer: Medicare Other

## 2011-05-24 DIAGNOSIS — J309 Allergic rhinitis, unspecified: Secondary | ICD-10-CM | POA: Diagnosis not present

## 2011-05-31 ENCOUNTER — Ambulatory Visit (INDEPENDENT_AMBULATORY_CARE_PROVIDER_SITE_OTHER): Payer: Medicare Other

## 2011-05-31 DIAGNOSIS — J309 Allergic rhinitis, unspecified: Secondary | ICD-10-CM

## 2011-06-07 ENCOUNTER — Ambulatory Visit (INDEPENDENT_AMBULATORY_CARE_PROVIDER_SITE_OTHER): Payer: Medicare Other

## 2011-06-07 DIAGNOSIS — J309 Allergic rhinitis, unspecified: Secondary | ICD-10-CM

## 2011-06-14 ENCOUNTER — Ambulatory Visit (INDEPENDENT_AMBULATORY_CARE_PROVIDER_SITE_OTHER): Payer: Medicare Other

## 2011-06-14 DIAGNOSIS — J309 Allergic rhinitis, unspecified: Secondary | ICD-10-CM | POA: Diagnosis not present

## 2011-06-21 ENCOUNTER — Ambulatory Visit (INDEPENDENT_AMBULATORY_CARE_PROVIDER_SITE_OTHER): Payer: Medicare Other | Admitting: Emergency Medicine

## 2011-06-21 ENCOUNTER — Encounter: Payer: Self-pay | Admitting: Emergency Medicine

## 2011-06-21 ENCOUNTER — Ambulatory Visit (INDEPENDENT_AMBULATORY_CARE_PROVIDER_SITE_OTHER): Payer: Medicare Other

## 2011-06-21 VITALS — BP 116/72 | HR 70 | Temp 98.1°F | Ht 65.0 in | Wt 105.6 lb

## 2011-06-21 DIAGNOSIS — J479 Bronchiectasis, uncomplicated: Secondary | ICD-10-CM

## 2011-06-21 DIAGNOSIS — J309 Allergic rhinitis, unspecified: Secondary | ICD-10-CM

## 2011-06-21 NOTE — Assessment & Plan Note (Signed)
Continued cough, remains worse and w higher volume sputum. Sputum cx positive for MAIC, sensitivities pending. She has been rx for [facility] before. Will check on sensitivities before planning treatment. Will also get her past notes to see what regimen she tolerated in the past. I will call her when sensitivities come back. She will f/u in 6 weeks.

## 2011-06-21 NOTE — Patient Instructions (Signed)
We are waiting for antibiotic sensitivities before starting your medications Follow with Dr Delton Coombes in 6 weeks

## 2011-06-21 NOTE — Progress Notes (Signed)
Patient ID: Mary [name], female    DOB: 1945/11/13, 66 y.o.   MRN: [id number]  HPI Mary [name] is a 66 year old woman whom I have followed for chronic cough. 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 Evaluated 2011 by Dr Maple Hudson for allergies - shows multiple sensitivities that are present year round.   ROV 07/12/09 -- returns for f/u. Tells me that her PND has actually improved since last visit. Her regimen is benadryl 25mg  two times a day, NSW two times a day, flutter valve two times a day - sometimes productive but often not. Also on rotating doxy + azithromycin. No flares of bronchiectasis since last visit. She is contemplating starting allergy shots, hopefully can get them from her PCP.   ROV 10/01/09 -- f/u bronchiectasis, cough. Flare that started about a week ago. Began treatment with Avelox and set up this follow up. Was exposed to husband who had a URI. Changed her benadryl to Claritin D. She has not started allergy shots yet. Starting to feel better, not back to baseline. never started Singulair.   ROV 01/24/10 -- Hx chronic cough, bronchiectasis. Tried daliresp, couldn't tolerate due to insomnia. Still with clear drainage, never responded to treatment with nasal steroid. On benadryl qhs. Taking alternating doxy and azithro monthly. Considering starting allergy shots - discussing w Dr Maple Hudson.   ROV 08/05/10 -- bronchiectasis, chronic cough, severe allergic rhinitis. Doing NSW qd and flutter qd.  She is on allergy shots and nasal gtt may be better. She is alternating doxy with azithro at the beginning of every month. Her cough is predicatble - morning and afternoon. Worst if she has been off abx for an extended period. Last CT scan was 2008.   10/19/10-  66 yo F  Never smoker followed for bronchiectasis/ hx M.avium, chronic cough, severe allergic rhinitis Started allergy vaccine in January, now at 1:50 with occasional  minimal local soreness but no problems. Says the spring pollen season was "rough"- increased nasal stuffiness and cough. Has been better in last 2 months. Still takes benadryl once each night. Hx of trying many nasal sprays. Still some nasal congestion and drip at times. She is interested in getting her shots in [place] at her PCP. allergy profile- IgE 2.4, negative common allergens. Negative Fungal antibody panel  01/20/11-  66 yo F  Never smoker followed for bronchiectasis/ hx M.avium, chronic cough (Dr Delton Coombes), severe allergic rhinitis She returns for followup of her allergy vaccine therapy begun in January 2012. She says she had 2 good weeks at the beginning of September but now feels nasal congestion in the morning with watery rhinorrhea later in the day "like a cold". She has not been taking antihistamines very often, but prefers Benadryl. Cough is productive of clear to yellow sputum with no blood or fever. Dr Delton Coombes is alternating antibiotics. Flu shot is pending with her primary physician  ROV 05/05/11 -- bronchiectasis, chronic cough, severe allergic rhinitis. Hx MAIC and also MRSA. She has started allergy shots w Dr Maple Hudson. She still has congestion and drainage, assoc w dry cough. She is alternating abx (doxy/azithro) beginning every month. The cough and sputum is bad between abx and she often has to start early. Doxy is better than azithro, but it is taking longer for it to be effective. She is wondering if she might be having some reflux.   ROV 06/21/11 -- bronchiectasis w hx MAIC + MRSA, chronic cough both due to  bronchiectasis and to UA irritation, severe allergic rhinitis. Returns after sputum cx >> shows MAIC, sensitivities not yet available. She continues to have cough that is most productive in the am and at the end of the day.   Objective:    Gen: Pleasant, thin, in no distress,  normal affect  ENT: No lesions,  mouth clear,  oropharynx clear, no postnasal drip  Neck: No JVD, no  TMG, no carotid bruits  Lungs: No use of accessory muscles, no dullness to percussion, clear without rales or rhonchi  Cardiovascular: RRR, heart sounds normal, no murmur or gallops, no peripheral edema  Musculoskeletal: No deformities, no cyanosis or clubbing  Neuro: alert, non focal  Skin: Warm, no lesions or rashes  BRONCHIECTASIS Continued cough, remains worse and w higher volume sputum. Sputum cx positive for MAIC, sensitivities pending. She has been rx for [facility] before. Will check on sensitivities before planning treatment. Will also get her past notes to see what regimen she tolerated in the past. I will call her when sensitivities come back. She will f/u in 6 weeks.

## 2011-06-28 ENCOUNTER — Ambulatory Visit (INDEPENDENT_AMBULATORY_CARE_PROVIDER_SITE_OTHER): Payer: Medicare Other

## 2011-06-28 DIAGNOSIS — J309 Allergic rhinitis, unspecified: Secondary | ICD-10-CM | POA: Diagnosis not present

## 2011-07-05 ENCOUNTER — Ambulatory Visit (INDEPENDENT_AMBULATORY_CARE_PROVIDER_SITE_OTHER): Payer: Medicare Other

## 2011-07-05 DIAGNOSIS — J309 Allergic rhinitis, unspecified: Secondary | ICD-10-CM | POA: Diagnosis not present

## 2011-07-11 ENCOUNTER — Telehealth: Payer: Self-pay | Admitting: Emergency Medicine

## 2011-07-11 NOTE — Telephone Encounter (Signed)
Sensitivity report received and placed in RB look at for review.

## 2011-07-12 NOTE — Telephone Encounter (Signed)
Chart ordered.

## 2011-07-12 NOTE — Telephone Encounter (Signed)
Mary [name] - I looked at the sensitivities. Can we get her old paper chart so I can see what regimen she tolerated in the past? Thanks

## 2011-07-13 NOTE — Telephone Encounter (Signed)
Chart received and given to Dr. Delton Coombes for review.

## 2011-07-14 MED ORDER — CLARITHROMYCIN 500 MG PO TABS: 500.0000 mg | ORAL_TABLET | Freq: Two times a day (BID) | ORAL | Status: AC

## 2011-07-14 MED ORDER — ETHAMBUTOL HCL 400 MG PO TABS: 600.0000 mg | ORAL_TABLET | Freq: Every day | ORAL | Status: DC

## 2011-07-14 NOTE — Telephone Encounter (Signed)
Spoke w patient about sensitivities (avelox R, clarithro S, ethambutol and rifabutin with low MIC's). Will start clarithro + ethambutol. She had some trouble tolerating clarithro in the past (bad taste in mouth).

## 2011-07-19 ENCOUNTER — Ambulatory Visit (INDEPENDENT_AMBULATORY_CARE_PROVIDER_SITE_OTHER): Payer: Medicare Other

## 2011-07-19 DIAGNOSIS — J309 Allergic rhinitis, unspecified: Secondary | ICD-10-CM | POA: Diagnosis not present

## 2011-07-24 ENCOUNTER — Encounter: Payer: Self-pay | Admitting: Emergency Medicine

## 2011-07-25 ENCOUNTER — Ambulatory Visit: Payer: Medicare Other | Admitting: Internal Medicine

## 2011-07-26 ENCOUNTER — Ambulatory Visit (INDEPENDENT_AMBULATORY_CARE_PROVIDER_SITE_OTHER): Payer: Medicare Other

## 2011-07-26 DIAGNOSIS — J309 Allergic rhinitis, unspecified: Secondary | ICD-10-CM | POA: Diagnosis not present

## 2011-07-27 ENCOUNTER — Ambulatory Visit (INDEPENDENT_AMBULATORY_CARE_PROVIDER_SITE_OTHER): Payer: Medicare Other

## 2011-07-27 DIAGNOSIS — J309 Allergic rhinitis, unspecified: Secondary | ICD-10-CM

## 2011-08-02 ENCOUNTER — Ambulatory Visit (INDEPENDENT_AMBULATORY_CARE_PROVIDER_SITE_OTHER): Payer: Medicare Other

## 2011-08-02 DIAGNOSIS — J309 Allergic rhinitis, unspecified: Secondary | ICD-10-CM | POA: Diagnosis not present

## 2011-08-04 ENCOUNTER — Ambulatory Visit (INDEPENDENT_AMBULATORY_CARE_PROVIDER_SITE_OTHER): Payer: Medicare Other | Admitting: Emergency Medicine

## 2011-08-04 ENCOUNTER — Encounter: Payer: Self-pay | Admitting: Emergency Medicine

## 2011-08-04 VITALS — BP 102/64 | HR 90 | Temp 97.9°F | Ht 65.0 in | Wt 103.8 lb

## 2011-08-04 DIAGNOSIS — A318 Other mycobacterial infections: Secondary | ICD-10-CM

## 2011-08-04 DIAGNOSIS — R05 Cough: Secondary | ICD-10-CM

## 2011-08-04 DIAGNOSIS — J479 Bronchiectasis, uncomplicated: Secondary | ICD-10-CM

## 2011-08-04 NOTE — Progress Notes (Signed)
Patient ID: Mary [name], female    DOB: 1945/07/29, 66 y.o.   MRN: [id number] HPI [name] is a 66 year old woman whom I have followed for chronic cough. 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 Evaluated 2011 by Dr Maple Hudson for allergies - shows multiple sensitivities that are present year round.   ROV 07/12/09 -- returns for f/u. Tells me that her PND has actually improved since last visit. Her regimen is benadryl 25mg  two times a day, NSW two times a day, flutter valve two times a day - sometimes productive but often not. Also on rotating doxy + azithromycin. No flares of bronchiectasis since last visit. She is contemplating starting allergy shots, hopefully can get them from her PCP.   ROV 10/01/09 -- f/u bronchiectasis, cough. Flare that started about a week ago. Began treatment with Avelox and set up this follow up. Was exposed to husband who had a URI. Changed her benadryl to Claritin D. She has not started allergy shots yet. Starting to feel better, not back to baseline. never started Singulair.   ROV 01/24/10 -- Hx chronic cough, bronchiectasis. Tried daliresp, couldn't tolerate due to insomnia. Still with clear drainage, never responded to treatment with nasal steroid. On benadryl qhs. Taking alternating doxy and azithro monthly. Considering starting allergy shots - discussing w Dr Maple Hudson.   ROV 08/05/10 -- bronchiectasis, chronic cough, severe allergic rhinitis. Doing NSW qd and flutter qd.  She is on allergy shots and nasal gtt may be better. She is alternating doxy with azithro at the beginning of every month. Her cough is predicatble - morning and afternoon. Worst if she has been off abx for an extended period. Last CT scan was 2008.   10/19/10-  66 yo F  Never smoker followed for bronchiectasis/ hx M.avium, chronic cough, severe allergic rhinitis Started allergy vaccine in January, now at 1:50 with occasional  minimal local soreness but no problems. Says the spring pollen season was "rough"- increased nasal stuffiness and cough. Has been better in last 2 months. Still takes benadryl once each night. Hx of trying many nasal sprays. Still some nasal congestion and drip at times. She is interested in getting her shots in [place] at her PCP. allergy profile- IgE 2.4, negative common allergens. Negative Fungal antibody panel  01/20/11-  66 yo F  Never smoker followed for bronchiectasis/ hx M.avium, chronic cough (Dr Delton Coombes), severe allergic rhinitis She returns for followup of her allergy vaccine therapy begun in January 2012. She says she had 2 good weeks at the beginning of September but now feels nasal congestion in the morning with watery rhinorrhea later in the day "like a cold". She has not been taking antihistamines very often, but prefers Benadryl. Cough is productive of clear to yellow sputum with no blood or fever. Dr Delton Coombes is alternating antibiotics. Flu shot is pending with her primary physician  ROV 05/05/11 -- bronchiectasis, chronic cough, severe allergic rhinitis. Hx MAIC and also MRSA. She has started allergy shots w Dr Maple Hudson. She still has congestion and drainage, assoc w dry cough. She is alternating abx (doxy/azithro) beginning every month. The cough and sputum is bad between abx and she often has to start early. Doxy is better than azithro, but it is taking longer for it to be effective. She is wondering if she might be having some reflux.   ROV 06/21/11 -- bronchiectasis w hx MAIC + MRSA, chronic cough both due to bronchiectasis  and to UA irritation, severe allergic rhinitis. Returns after sputum cx >> shows MAIC, sensitivities not yet available. She continues to have cough that is most productive in the am and at the end of the day.   ROV 08/04/11 -- bronchiectasis w hx MAIC + MRSA, chronic cough both due to bronchiectasis and to UA irritation, severe allergic rhinitis. We started clarithro +  ethambutol for her [facility], started it on 4/15. She has the bitter taste in her mouth, had this last time. I had thought this was due to rifabutin, but possibly due to clarithro. Not currently on doxy. Since last time she thinks she had URI, now probably back to baseline.    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   BRONCHIECTASIS Flutter on a schedule attmept to treat [facility] May need to consider adding back rx for MRSA

## 2011-08-04 NOTE — Assessment & Plan Note (Signed)
Flutter on a schedule attmept to treat [facility] May need to consider adding back rx for MRSA

## 2011-08-04 NOTE — Assessment & Plan Note (Signed)
-   chronic rhinitis superimposed on her bronchiectasis.

## 2011-08-04 NOTE — Patient Instructions (Signed)
Please continue your clarithromycin and ethambutol as you are taking them Follow with Dr Delton Coombes in 6 months or sooner if you have any problems

## 2011-08-04 NOTE — Assessment & Plan Note (Signed)
-   clarithro + ethambutol

## 2011-08-10 ENCOUNTER — Ambulatory Visit (INDEPENDENT_AMBULATORY_CARE_PROVIDER_SITE_OTHER): Payer: Medicare Other

## 2011-08-10 DIAGNOSIS — J309 Allergic rhinitis, unspecified: Secondary | ICD-10-CM | POA: Diagnosis not present

## 2011-08-14 DIAGNOSIS — H2510 Age-related nuclear cataract, unspecified eye: Secondary | ICD-10-CM | POA: Diagnosis not present

## 2011-08-16 ENCOUNTER — Ambulatory Visit (INDEPENDENT_AMBULATORY_CARE_PROVIDER_SITE_OTHER): Payer: Medicare Other

## 2011-08-16 DIAGNOSIS — J309 Allergic rhinitis, unspecified: Secondary | ICD-10-CM | POA: Diagnosis not present

## 2011-08-23 ENCOUNTER — Ambulatory Visit (INDEPENDENT_AMBULATORY_CARE_PROVIDER_SITE_OTHER): Payer: Medicare Other

## 2011-08-23 DIAGNOSIS — J309 Allergic rhinitis, unspecified: Secondary | ICD-10-CM

## 2011-08-25 ENCOUNTER — Encounter: Payer: Self-pay | Admitting: Internal Medicine

## 2011-08-25 ENCOUNTER — Telehealth: Payer: Self-pay | Admitting: Emergency Medicine

## 2011-08-25 MED ORDER — DOXYCYCLINE HYCLATE 100 MG PO TABS: 100.0000 mg | ORAL_TABLET | Freq: Two times a day (BID) | ORAL | Status: AC

## 2011-08-25 NOTE — Telephone Encounter (Signed)
Discussed her sx. Question whether this is because she is colonized by MRSA as well as MAIC. Could also reflect allergies, which we are treating maximally.  - will treat w doxy x 10 days - tussionex prn called in

## 2011-08-25 NOTE — Telephone Encounter (Signed)
Called and spoke with pt and she stated that she has been on the meds given by RB for about 5 weeks and pt states that she is worse now than she was before.  Still couging, sometime to the point where she will vomit and when she lays down she coughs for at least 30 mins.  She stated that she has violent coughing spells.  She is feeling worse now than before.  Pt is requesting recs from RB.  Please advise. Thanks Allergies  Allergen Reactions  . Sulfamethoxazole W-Trimethoprim     REACTION: Rash

## 2011-08-30 ENCOUNTER — Ambulatory Visit (INDEPENDENT_AMBULATORY_CARE_PROVIDER_SITE_OTHER): Payer: Medicare Other

## 2011-08-30 DIAGNOSIS — J309 Allergic rhinitis, unspecified: Secondary | ICD-10-CM | POA: Diagnosis not present

## 2011-09-01 ENCOUNTER — Encounter: Payer: Self-pay | Admitting: Internal Medicine

## 2011-09-01 ENCOUNTER — Ambulatory Visit (INDEPENDENT_AMBULATORY_CARE_PROVIDER_SITE_OTHER): Payer: Medicare Other | Admitting: Internal Medicine

## 2011-09-01 VITALS — BP 94/60 | HR 79 | Ht 65.0 in | Wt 101.8 lb

## 2011-09-01 DIAGNOSIS — J301 Allergic rhinitis due to pollen: Secondary | ICD-10-CM

## 2011-09-01 DIAGNOSIS — R05 Cough: Secondary | ICD-10-CM

## 2011-09-01 NOTE — Progress Notes (Signed)
Patient ID: Mary [name], female    DOB: 1945/07/29, 66 y.o.   MRN: [id number] HPI Mary [name] is a 66 year old woman whom I have followed for chronic cough. 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 Evaluated 2011 by Dr Maple Hudson for allergies - shows multiple sensitivities that are present year round.   ROV 07/12/09 -- returns for f/u. Tells me that her PND has actually improved since last visit. Her regimen is benadryl 25mg  two times a day, NSW two times a day, flutter valve two times a day - sometimes productive but often not. Also on rotating doxy + azithromycin. No flares of bronchiectasis since last visit. She is contemplating starting allergy shots, hopefully can get them from her PCP.   ROV 10/01/09 -- f/u bronchiectasis, cough. Flare that started about a week ago. Began treatment with Avelox and set up this follow up. Was exposed to husband who had a URI. Changed her benadryl to Claritin D. She has not started allergy shots yet. Starting to feel better, not back to baseline. never started Singulair.   ROV 01/24/10 -- Hx chronic cough, bronchiectasis. Tried daliresp, couldn't tolerate due to insomnia. Still with clear drainage, never responded to treatment with nasal steroid. On benadryl qhs. Taking alternating doxy and azithro monthly. Considering starting allergy shots - discussing w Dr Maple Hudson.   ROV 08/05/10 -- bronchiectasis, chronic cough, severe allergic rhinitis. Doing NSW qd and flutter qd.  She is on allergy shots and nasal gtt may be better. She is alternating doxy with azithro at the beginning of every month. Her cough is predicatble - morning and afternoon. Worst if she has been off abx for an extended period. Last CT scan was 2008.   10/19/10-  66 yo F  Never smoker followed for bronchiectasis/ hx M.avium, chronic cough, severe allergic rhinitis Started allergy vaccine in January, now at 1:50 with occasional  minimal local soreness but no problems. Says the spring pollen season was "rough"- increased nasal stuffiness and cough. Has been better in last 2 months. Still takes benadryl once each night. Hx of trying many nasal sprays. Still some nasal congestion and drip at times. She is interested in getting her shots in [place] at her PCP. allergy profile- IgE 2.4, negative common allergens. Negative Fungal antibody panel  01/20/11-  66 yo F  Never smoker followed for bronchiectasis/ hx M.avium, chronic cough (Dr Delton Coombes), severe allergic rhinitis She returns for followup of her allergy vaccine therapy begun in January 2012. She says she had 2 good weeks at the beginning of September but now feels nasal congestion in the morning with watery rhinorrhea later in the day "like a cold". She has not been taking antihistamines very often, but prefers Benadryl. Cough is productive of clear to yellow sputum with no blood or fever. Dr Delton Coombes is alternating antibiotics. Flu shot is pending with her primary physician  ROV 05/05/11 -- bronchiectasis, chronic cough, severe allergic rhinitis. Hx MAIC and also MRSA. She has started allergy shots w Dr Maple Hudson. She still has congestion and drainage, assoc w dry cough. She is alternating abx (doxy/azithro) beginning every month. The cough and sputum is bad between abx and she often has to start early. Doxy is better than azithro, but it is taking longer for it to be effective. She is wondering if she might be having some reflux.   ROV 06/21/11 -- bronchiectasis w hx MAIC + MRSA, chronic cough both due to bronchiectasis  and to UA irritation, severe allergic rhinitis. Returns after sputum cx >> shows MAIC, sensitivities not yet available. She continues to have cough that is most productive in the am and at the end of the day.   ROV 08/04/11 -- bronchiectasis w hx MAIC + MRSA, chronic cough both due to bronchiectasis and to UA irritation, severe allergic rhinitis. We started clarithro +  ethambutol for her [facility], started it on 4/15. She has the bitter taste in her mouth, had this last time. I had thought this was due to rifabutin, but possibly due to clarithro. Not currently on doxy. Since last time she thinks she had URI, now probably back to baseline.   09/01/11- 66 yo F  Never smoker followed by Dr Delton Coombes for bronchiectasis/ hx M.avium, chronic cough,  And by Dr Maple Hudson for severe allergic rhinitis/ Allergy vaccine  Patient states has had a hard time with allergies this year.  c/o runny nose and sneezing, head congestion that come and go.  Hard coughing will increase her rhinorrhea. Blowing her nose 2 or 3 times a day. Strong odors/irritants/perfumes are triggers. Dry mouth in the morning after Benadryl at bedtime. We discussed alternatives. She is continuing allergy vaccine now at 1:10 without problems.  ROS-see HPI Constitutional:   No-   weight loss, night sweats, fevers, chills, fatigue, lassitude. HEENT:   No-  headaches, difficulty swallowing, tooth/dental problems, sore throat,       + sneezing, itching, ear ache, nasal congestion, post nasal drip,  CV:  No-   chest pain, orthopnea, PND, swelling in lower extremities, anasarca,  dizziness, palpitations Resp: No-   shortness of breath with exertion or at rest.              No-   productive cough,  No non-productive cough,  No- coughing up of blood.              No-   change in color of mucus.  No- wheezing.   Skin: No-   rash or lesions. GI:  No-   heartburn, indigestion, abdominal pain, nausea, vomiting,  GU:  MS:  No-   joint pain or swelling.   Neuro-     nothing unusual Psych:  No- change in mood or affect. No depression or anxiety.  No memory loss.  OBJ- Physical Exam General- Alert, Oriented, Affect-appropriate, Distress- none acute, slender Skin- rash-none, lesions- none, excoriation- none Lymphadenopathy- none Head- atraumatic            Eyes- Gross vision intact, PERRLA, conjunctivae and secretions clear             Ears- Hearing, canals-normal            Nose- mucus bilaterally, no-Septal dev,  polyps, erosion, perforation             Throat- Mallampati II , mucosa clear , drainage- none, tonsils- atrophic Neck- flexible , trachea midline, no stridor , thyroid nl, carotid no bruit Chest - symmetrical excursion , unlabored           Heart/CV- RRR , no murmur , no gallop  , no rub, nl s1 s2                           - JVD- none , edema- none, stasis changes- none, varices- none           Lung- clear to P&A, wheeze- none, cough- none , dullness-none, rub- none  Chest wall-  Abd-  Br/ Gen/ Rectal- Not done, not indicated Extrem- cyanosis- none, clubbing, none, atrophy- none, strength- nl Neuro- grossly intact to observation

## 2011-09-01 NOTE — Patient Instructions (Signed)
Sample Qnasl   Nasal spray-        1 puff each nostril, once every day at bedtime

## 2011-09-06 ENCOUNTER — Ambulatory Visit (INDEPENDENT_AMBULATORY_CARE_PROVIDER_SITE_OTHER): Payer: Medicare Other

## 2011-09-06 DIAGNOSIS — J309 Allergic rhinitis, unspecified: Secondary | ICD-10-CM | POA: Diagnosis not present

## 2011-09-07 NOTE — Assessment & Plan Note (Signed)
She says cough is her biggest complaint. It may be partly related to postnasal drainage, which we are working on, but it is multifactorial.

## 2011-09-07 NOTE — Assessment & Plan Note (Signed)
She describes a significant pattern of nasal congestion on exposure to strong odors suggesting she has some vasomotor rhinitis. Plan-continue allergy vaccine. Sample Qnasl.

## 2011-09-11 DIAGNOSIS — N3020 Other chronic cystitis without hematuria: Secondary | ICD-10-CM | POA: Diagnosis not present

## 2011-09-11 DIAGNOSIS — R82998 Other abnormal findings in urine: Secondary | ICD-10-CM | POA: Diagnosis not present

## 2011-09-11 DIAGNOSIS — N3000 Acute cystitis without hematuria: Secondary | ICD-10-CM | POA: Diagnosis not present

## 2011-09-11 DIAGNOSIS — N952 Postmenopausal atrophic vaginitis: Secondary | ICD-10-CM | POA: Diagnosis not present

## 2011-09-15 ENCOUNTER — Ambulatory Visit (INDEPENDENT_AMBULATORY_CARE_PROVIDER_SITE_OTHER): Payer: Medicare Other

## 2011-09-15 DIAGNOSIS — J309 Allergic rhinitis, unspecified: Secondary | ICD-10-CM

## 2011-09-20 ENCOUNTER — Ambulatory Visit (INDEPENDENT_AMBULATORY_CARE_PROVIDER_SITE_OTHER): Payer: Medicare Other

## 2011-09-20 DIAGNOSIS — J309 Allergic rhinitis, unspecified: Secondary | ICD-10-CM | POA: Diagnosis not present

## 2011-09-27 ENCOUNTER — Ambulatory Visit (INDEPENDENT_AMBULATORY_CARE_PROVIDER_SITE_OTHER): Payer: Medicare Other

## 2011-09-27 DIAGNOSIS — J309 Allergic rhinitis, unspecified: Secondary | ICD-10-CM

## 2011-10-04 ENCOUNTER — Ambulatory Visit (INDEPENDENT_AMBULATORY_CARE_PROVIDER_SITE_OTHER): Payer: Medicare Other

## 2011-10-04 DIAGNOSIS — J309 Allergic rhinitis, unspecified: Secondary | ICD-10-CM

## 2011-10-11 ENCOUNTER — Ambulatory Visit (INDEPENDENT_AMBULATORY_CARE_PROVIDER_SITE_OTHER): Payer: Medicare Other

## 2011-10-11 DIAGNOSIS — J309 Allergic rhinitis, unspecified: Secondary | ICD-10-CM | POA: Diagnosis not present

## 2011-10-18 ENCOUNTER — Ambulatory Visit (INDEPENDENT_AMBULATORY_CARE_PROVIDER_SITE_OTHER): Payer: Medicare Other

## 2011-10-18 DIAGNOSIS — J309 Allergic rhinitis, unspecified: Secondary | ICD-10-CM

## 2011-10-25 ENCOUNTER — Ambulatory Visit (INDEPENDENT_AMBULATORY_CARE_PROVIDER_SITE_OTHER): Payer: Medicare Other

## 2011-10-25 DIAGNOSIS — J309 Allergic rhinitis, unspecified: Secondary | ICD-10-CM

## 2011-10-26 ENCOUNTER — Ambulatory Visit: Payer: Medicare Other | Admitting: Emergency Medicine

## 2011-11-01 ENCOUNTER — Ambulatory Visit (INDEPENDENT_AMBULATORY_CARE_PROVIDER_SITE_OTHER): Payer: Medicare Other

## 2011-11-01 DIAGNOSIS — J309 Allergic rhinitis, unspecified: Secondary | ICD-10-CM | POA: Diagnosis not present

## 2011-11-06 ENCOUNTER — Other Ambulatory Visit: Payer: Self-pay | Admitting: Obstetrics & Gynecology

## 2011-11-06 DIAGNOSIS — Z1231 Encounter for screening mammogram for malignant neoplasm of breast: Secondary | ICD-10-CM | POA: Diagnosis not present

## 2011-11-06 DIAGNOSIS — Z124 Encounter for screening for malignant neoplasm of cervix: Secondary | ICD-10-CM | POA: Diagnosis not present

## 2011-11-08 ENCOUNTER — Ambulatory Visit (INDEPENDENT_AMBULATORY_CARE_PROVIDER_SITE_OTHER): Payer: Medicare Other

## 2011-11-08 DIAGNOSIS — J309 Allergic rhinitis, unspecified: Secondary | ICD-10-CM

## 2011-11-13 ENCOUNTER — Ambulatory Visit (INDEPENDENT_AMBULATORY_CARE_PROVIDER_SITE_OTHER): Payer: Medicare Other | Admitting: Emergency Medicine

## 2011-11-13 ENCOUNTER — Encounter: Payer: Self-pay | Admitting: Emergency Medicine

## 2011-11-13 VITALS — BP 102/56 | HR 71 | Temp 98.2°F | Ht 64.0 in | Wt 102.0 lb

## 2011-11-13 DIAGNOSIS — J479 Bronchiectasis, uncomplicated: Secondary | ICD-10-CM | POA: Diagnosis not present

## 2011-11-13 DIAGNOSIS — R05 Cough: Secondary | ICD-10-CM

## 2011-11-13 MED ORDER — OMEPRAZOLE 20 MG PO CPDR: 20.0000 mg | DELAYED_RELEASE_CAPSULE | Freq: Every day | ORAL | Status: DC

## 2011-11-13 NOTE — Progress Notes (Signed)
Patient ID: Mary [name], female    DOB: 1945-12-22, 66 y.o.   MRN: [id number] HPI Mary [name] is a 65 year old woman whom I have followed for chronic cough. 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 Evaluated 2011 by Mary [name] for allergies - shows multiple sensitivities that are present year round.   ROV 07/12/09 -- returns for f/u. Tells me that her PND has actually improved since last visit. Her regimen is benadryl 25mg  two times a day, NSW two times a day, flutter valve two times a day - sometimes productive but often not. Also on rotating doxy + azithromycin. No flares of bronchiectasis since last visit. She is contemplating starting allergy shots, hopefully can get them from her PCP.   ROV 10/01/09 -- f/u bronchiectasis, cough. Flare that started about a week ago. Began treatment with Avelox and set up this follow up. Was exposed to husband who had a URI. Changed her benadryl to Claritin D. She has not started allergy shots yet. Starting to feel better, not back to baseline. never started Singulair.   ROV 01/24/10 -- Hx chronic cough, bronchiectasis. Tried daliresp, couldn't tolerate due to insomnia. Still with clear drainage, never responded to treatment with nasal steroid. On benadryl qhs. Taking alternating doxy and azithro monthly. Considering starting allergy shots - discussing w Mary [name].   ROV 08/05/10 -- bronchiectasis, chronic cough, severe allergic rhinitis. Doing NSW qd and flutter qd.  She is on allergy shots and nasal gtt may be better. She is alternating doxy with azithro at the beginning of every month. Her cough is predicatble - morning and afternoon. Worst if she has been off abx for an extended period. Last CT scan was 2008.   10/19/10-  71 yo F  Never smoker followed for bronchiectasis/ hx M.avium, chronic cough, severe allergic rhinitis Started allergy vaccine in January, now at 1:50 with occasional minimal  local soreness but no problems. Says the spring pollen season was "rough"- increased nasal stuffiness and cough. Has been better in last 2 months. Still takes benadryl once each night. Hx of trying many nasal sprays. Still some nasal congestion and drip at times. She is interested in getting her shots in [place] at her PCP. allergy profile- IgE 2.4, negative common allergens. Negative Fungal antibody panel  01/20/11-  29 yo F  Never smoker followed for bronchiectasis/ hx M.avium, chronic cough (Mary Delton Coombes), severe allergic rhinitis She returns for followup of her allergy vaccine therapy begun in January 2012. She says she had 2 good weeks at the beginning of September but now feels nasal congestion in the morning with watery rhinorrhea later in the day "like a cold". She has not been taking antihistamines very often, but prefers Benadryl. Cough is productive of clear to yellow sputum with no blood or fever. Mary Delton Coombes is alternating antibiotics. Flu shot is pending with her primary physician  ROV 05/05/11 -- bronchiectasis, chronic cough, severe allergic rhinitis. Hx MAIC and also MRSA. She has started allergy shots w Mary [name]. She still has congestion and drainage, assoc w dry cough. She is alternating abx (doxy/azithro) beginning every month. The cough and sputum is bad between abx and she often has to start early. Doxy is better than azithro, but it is taking longer for it to be effective. She is wondering if she might be having some reflux.   ROV 06/21/11 -- bronchiectasis w hx MAIC + MRSA, chronic cough both due to bronchiectasis and to  UA irritation, severe allergic rhinitis. Returns after sputum cx >> shows MAIC, sensitivities not yet available. She continues to have cough that is most productive in the am and at the end of the day.   ROV 08/04/11 -- bronchiectasis w hx MAIC + MRSA, chronic cough both due to bronchiectasis and to UA irritation, severe allergic rhinitis. We started clarithro + ethambutol  for her [facility], started it on 4/15. She has the bitter taste in her mouth, had this last time. I had thought this was due to rifabutin, but possibly due to clarithro. Not currently on doxy. Since last time she thinks she had URI, now probably back to baseline.   ROV 11/13/11 -- bronchiectasis w hx MAIC + MRSA, chronic cough both due to bronchiectasis and to UA irritation, severe allergic rhinitis. Has been on clarithro/ethambutol since 07/17/11. She has less energy on the abx. She continues to have cough that is productive of either green or clear. The cough is most bothersome at night, can wake her from sleep. She coughs every morning - clears secretions. Then around mid-day she has another bout, then again in the evening.      Objective:   Filed Vitals:   11/13/11 1625  BP: 102/56  Pulse: 71  Temp: 98.2 F (36.8 C)   Gen: Pleasant, thin, in no distress,  normal affect  ENT: No lesions,  mouth clear,  oropharynx clear, no postnasal drip  Neck: No JVD, no TMG, no carotid bruits  Lungs: No use of accessory muscles, no dullness to percussion, clear without rales or rhonchi  Cardiovascular: RRR, heart sounds normal, no murmur or gallops, no peripheral edema  Musculoskeletal: No deformities, no cyanosis or clubbing  Neuro: alert, non focal  Skin: Warm, no lesions or rashes  COUGH, CHRONIC Clearly a lot of this is her bronchiectasis, but question whether there is also a GERD component.  - same bronchiectasis regimen - trial omeprazole bid x 2 weeks, then qd until next visit  BRONCHIECTASIS - continue clarithro + ethamubtol for [facility], she has been on for 4 months, likely rx x 6-12 months - likely still colonized with MRSA, not currently on rx. ? Whether we will have to attempt eradication at some point. If so then will probably need ID referral

## 2011-11-13 NOTE — Assessment & Plan Note (Signed)
Clearly a lot of this is her bronchiectasis, but question whether there is also a GERD component.  - same bronchiectasis regimen - trial omeprazole bid x 2 weeks, then qd until next visit

## 2011-11-13 NOTE — Patient Instructions (Addendum)
Continue your antibiotics as ordered  Start omeprazole 20mg  twice a day for two weeks and then decrease to daily until your next visit Follow with Dr Delton Coombes in 2 months or sooner if you have any problems.

## 2011-11-13 NOTE — Assessment & Plan Note (Addendum)
-   continue clarithro + ethamubtol for [facility], she has been on for 4 months, likely rx x 6-12 months - likely still colonized with MRSA, not currently on rx. ? Whether we will have to attempt eradication at some point. If so then will probably need ID referral

## 2011-11-14 ENCOUNTER — Encounter: Payer: Self-pay | Admitting: Internal Medicine

## 2011-11-15 ENCOUNTER — Ambulatory Visit (INDEPENDENT_AMBULATORY_CARE_PROVIDER_SITE_OTHER): Payer: Medicare Other

## 2011-11-15 DIAGNOSIS — J309 Allergic rhinitis, unspecified: Secondary | ICD-10-CM

## 2011-11-22 ENCOUNTER — Ambulatory Visit (INDEPENDENT_AMBULATORY_CARE_PROVIDER_SITE_OTHER): Payer: Medicare Other

## 2011-11-22 DIAGNOSIS — J309 Allergic rhinitis, unspecified: Secondary | ICD-10-CM

## 2011-12-06 ENCOUNTER — Ambulatory Visit (INDEPENDENT_AMBULATORY_CARE_PROVIDER_SITE_OTHER): Payer: Medicare Other

## 2011-12-06 DIAGNOSIS — J309 Allergic rhinitis, unspecified: Secondary | ICD-10-CM

## 2011-12-13 ENCOUNTER — Ambulatory Visit (INDEPENDENT_AMBULATORY_CARE_PROVIDER_SITE_OTHER): Payer: Medicare Other

## 2011-12-13 DIAGNOSIS — J309 Allergic rhinitis, unspecified: Secondary | ICD-10-CM | POA: Diagnosis not present

## 2011-12-14 ENCOUNTER — Ambulatory Visit (INDEPENDENT_AMBULATORY_CARE_PROVIDER_SITE_OTHER): Payer: Medicare Other

## 2011-12-14 DIAGNOSIS — J309 Allergic rhinitis, unspecified: Secondary | ICD-10-CM

## 2011-12-20 ENCOUNTER — Ambulatory Visit (INDEPENDENT_AMBULATORY_CARE_PROVIDER_SITE_OTHER): Payer: Medicare Other

## 2011-12-20 DIAGNOSIS — J309 Allergic rhinitis, unspecified: Secondary | ICD-10-CM | POA: Diagnosis not present

## 2011-12-20 DIAGNOSIS — N3020 Other chronic cystitis without hematuria: Secondary | ICD-10-CM | POA: Diagnosis not present

## 2011-12-27 ENCOUNTER — Ambulatory Visit (INDEPENDENT_AMBULATORY_CARE_PROVIDER_SITE_OTHER): Payer: Medicare Other

## 2011-12-27 DIAGNOSIS — J309 Allergic rhinitis, unspecified: Secondary | ICD-10-CM | POA: Diagnosis not present

## 2012-01-03 ENCOUNTER — Ambulatory Visit (INDEPENDENT_AMBULATORY_CARE_PROVIDER_SITE_OTHER): Payer: Medicare Other | Admitting: Internal Medicine

## 2012-01-03 ENCOUNTER — Ambulatory Visit (INDEPENDENT_AMBULATORY_CARE_PROVIDER_SITE_OTHER): Payer: Medicare Other

## 2012-01-03 ENCOUNTER — Encounter: Payer: Self-pay | Admitting: Internal Medicine

## 2012-01-03 VITALS — BP 122/68 | HR 81 | Ht 65.0 in | Wt 103.6 lb

## 2012-01-03 DIAGNOSIS — J301 Allergic rhinitis due to pollen: Secondary | ICD-10-CM

## 2012-01-03 DIAGNOSIS — A310 Pulmonary mycobacterial infection: Secondary | ICD-10-CM | POA: Diagnosis not present

## 2012-01-03 DIAGNOSIS — J309 Allergic rhinitis, unspecified: Secondary | ICD-10-CM | POA: Diagnosis not present

## 2012-01-03 NOTE — Patient Instructions (Addendum)
Sample Dymista nasal spray     1-2 puffs each nostril once every day at bedtime.  Continue clarithromycin and ethambutol

## 2012-01-03 NOTE — Progress Notes (Signed)
Patient ID: ARNELL MAUSOLF, female    DOB: 1945/07/29, 66 y.o.   MRN: [id number] HPI [name] is a 66 year old woman whom I have followed for chronic cough. 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 Evaluated 2011 by Dr Maple Hudson for allergies - shows multiple sensitivities that are present year round.   ROV 07/12/09 -- returns for f/u. Tells me that her PND has actually improved since last visit. Her regimen is benadryl 25mg  two times a day, NSW two times a day, flutter valve two times a day - sometimes productive but often not. Also on rotating doxy + azithromycin. No flares of bronchiectasis since last visit. She is contemplating starting allergy shots, hopefully can get them from her PCP.   ROV 10/01/09 -- f/u bronchiectasis, cough. Flare that started about a week ago. Began treatment with Avelox and set up this follow up. Was exposed to husband who had a URI. Changed her benadryl to Claritin D. She has not started allergy shots yet. Starting to feel better, not back to baseline. never started Singulair.   ROV 01/24/10 -- Hx chronic cough, bronchiectasis. Tried daliresp, couldn't tolerate due to insomnia. Still with clear drainage, never responded to treatment with nasal steroid. On benadryl qhs. Taking alternating doxy and azithro monthly. Considering starting allergy shots - discussing w Dr Maple Hudson.   ROV 08/05/10 -- bronchiectasis, chronic cough, severe allergic rhinitis. Doing NSW qd and flutter qd.  She is on allergy shots and nasal gtt may be better. She is alternating doxy with azithro at the beginning of every month. Her cough is predicatble - morning and afternoon. Worst if she has been off abx for an extended period. Last CT scan was 2008.   10/19/10-  44 yo F  Never smoker followed for bronchiectasis/ hx M.avium, chronic cough, severe allergic rhinitis Started allergy vaccine in January, now at 1:50 with occasional  minimal local soreness but no problems. Says the spring pollen season was "rough"- increased nasal stuffiness and cough. Has been better in last 2 months. Still takes benadryl once each night. Hx of trying many nasal sprays. Still some nasal congestion and drip at times. She is interested in getting her shots in [place] at her PCP. allergy profile- IgE 2.4, negative common allergens. Negative Fungal antibody panel  01/20/11-  52 yo F  Never smoker followed for bronchiectasis/ hx M.avium, chronic cough (Dr Delton Coombes), severe allergic rhinitis She returns for followup of her allergy vaccine therapy begun in January 2012. She says she had 2 good weeks at the beginning of September but now feels nasal congestion in the morning with watery rhinorrhea later in the day "like a cold". She has not been taking antihistamines very often, but prefers Benadryl. Cough is productive of clear to yellow sputum with no blood or fever. Dr Delton Coombes is alternating antibiotics. Flu shot is pending with her primary physician  ROV 05/05/11 -- bronchiectasis, chronic cough, severe allergic rhinitis. Hx MAIC and also MRSA. She has started allergy shots w Dr Maple Hudson. She still has congestion and drainage, assoc w dry cough. She is alternating abx (doxy/azithro) beginning every month. The cough and sputum is bad between abx and she often has to start early. Doxy is better than azithro, but it is taking longer for it to be effective. She is wondering if she might be having some reflux.   ROV 06/21/11 -- bronchiectasis w hx MAIC + MRSA, chronic cough both due to bronchiectasis  and to UA irritation, severe allergic rhinitis. Returns after sputum cx >> shows MAIC, sensitivities not yet available. She continues to have cough that is most productive in the am and at the end of the day.   ROV 08/04/11 -- bronchiectasis w hx MAIC + MRSA, chronic cough both due to bronchiectasis and to UA irritation, severe allergic rhinitis. We started clarithro +  ethambutol for her [facility], started it on 4/15. She has the bitter taste in her mouth, had this last time. I had thought this was due to rifabutin, but possibly due to clarithro. Not currently on doxy. Since last time she thinks she had URI, now probably back to baseline.   09/01/11- 3 yo F  Never smoker followed by Dr Delton Coombes for bronchiectasis/ hx M.avium, chronic cough,  And by Dr Maple Hudson for severe allergic rhinitis/ Allergy vaccine  Patient states has had a hard time with allergies this year.  c/o runny nose and sneezing, head congestion that come and go.  Hard coughing will increase her rhinorrhea. Blowing her nose 2 or 3 times a day. Strong odors/irritants/perfumes are triggers. Dry mouth in the morning after Benadryl at bedtime. We discussed alternatives. She is continuing allergy vaccine now at 1:10 without problems.  ROV 11/13/11 -- bronchiectasis w hx MAIC + MRSA, chronic cough both due to bronchiectasis and to UA irritation, severe allergic rhinitis. Has been on clarithro/ethambutol since 07/17/11. She has less energy on the abx. She continues to have cough that is productive of either green or clear. The cough is most bothersome at night, can wake her from sleep. She coughs every morning - clears secretions. Then around mid-day she has another bout, then again in the evening.    01/13/12 -62 yo F  Never smoker followed by Dr Delton Coombes for bronchiectasis/ hx M.avium, chronic cough,  And by Dr Maple Hudson for severe allergic rhinitis/ Allergy vaccine             gets flu vaccine at primary office Still on allergy vaccine 1:10 GH and not having much trouble at this time. More sensitive to odors. Qnasl did not help.   ROS-see HPI Constitutional:   No-   weight loss, night sweats, fevers, chills, fatigue, lassitude. HEENT:   No-  headaches, difficulty swallowing, tooth/dental problems, sore throat,       + sneezing, itching, ear ache, nasal congestion, post nasal drip,  CV:  No-   chest pain, orthopnea, PND,  swelling in lower extremities, anasarca,  dizziness, palpitations Resp: No-   shortness of breath with exertion or at rest.              No-   productive cough,  No non-productive cough,  No- coughing up of blood.              No-   change in color of mucus.  No- wheezing.   Skin: No-   rash or lesions. GI:  No-   heartburn, indigestion, abdominal pain, nausea, vomiting,  GU:  MS:  No-   joint pain or swelling.   Neuro-     nothing unusual Psych:  No- change in mood or affect. No depression or anxiety.  No memory loss.  OBJ- Physical Exam General- Alert, Oriented, Affect-appropriate, Distress- none acute, slender Skin- rash-none, lesions- none, excoriation- none Lymphadenopathy- none Head- atraumatic            Eyes- Gross vision intact, PERRLA, conjunctivae and secretions clear  Ears- Hearing, canals-normal            Nose- + nasal mucosa pale and watery, no-Septal dev,  polyps, erosion, perforation             Throat- Mallampati II , mucosa clear , drainage- none, tonsils- atrophic Neck- flexible , trachea midline, no stridor , thyroid nl, carotid no bruit Chest - symmetrical excursion , unlabored           Heart/CV- RRR , no murmur , no gallop  , no rub, nl s1 s2                           - JVD- none , edema- none, stasis changes- none, varices- none           Lung- clear to P&A, wheeze- none, cough- none , dullness-none, rub- none           Chest wall-  Abd-  Br/ Gen/ Rectal- Not done, not indicated Extrem- cyanosis- none, clubbing, none, atrophy- none, strength- nl Neuro- grossly intact to observation

## 2012-01-10 ENCOUNTER — Ambulatory Visit (INDEPENDENT_AMBULATORY_CARE_PROVIDER_SITE_OTHER): Payer: Medicare Other

## 2012-01-10 DIAGNOSIS — J309 Allergic rhinitis, unspecified: Secondary | ICD-10-CM

## 2012-01-12 NOTE — Assessment & Plan Note (Addendum)
Erythromycin and ethambutol begun 07/14/2011.. We discussed goals and end points.

## 2012-01-12 NOTE — Assessment & Plan Note (Signed)
Doing well with allergy vaccine and is satisfied that it helps her

## 2012-01-17 ENCOUNTER — Ambulatory Visit (INDEPENDENT_AMBULATORY_CARE_PROVIDER_SITE_OTHER): Payer: Medicare Other

## 2012-01-17 DIAGNOSIS — J309 Allergic rhinitis, unspecified: Secondary | ICD-10-CM

## 2012-01-19 DIAGNOSIS — Z79899 Other long term (current) drug therapy: Secondary | ICD-10-CM | POA: Diagnosis not present

## 2012-01-19 DIAGNOSIS — E785 Hyperlipidemia, unspecified: Secondary | ICD-10-CM | POA: Diagnosis not present

## 2012-01-22 ENCOUNTER — Encounter: Payer: Self-pay | Admitting: Emergency Medicine

## 2012-01-22 ENCOUNTER — Ambulatory Visit (INDEPENDENT_AMBULATORY_CARE_PROVIDER_SITE_OTHER): Payer: Medicare Other | Admitting: Emergency Medicine

## 2012-01-22 VITALS — BP 120/62 | HR 79 | Temp 98.8°F | Ht 65.0 in | Wt 104.8 lb

## 2012-01-22 DIAGNOSIS — R05 Cough: Secondary | ICD-10-CM | POA: Diagnosis not present

## 2012-01-22 DIAGNOSIS — A310 Pulmonary mycobacterial infection: Secondary | ICD-10-CM | POA: Diagnosis not present

## 2012-01-22 MED ORDER — PREDNISONE 10 MG PO TABS: ORAL_TABLET | ORAL | Status: DC

## 2012-01-22 NOTE — Patient Instructions (Addendum)
Please start prednisone taper as directed. Take it to completion. Call our office if you have any problems on the medication.  Follow with Dr Delton Coombes in 3 months

## 2012-01-22 NOTE — Assessment & Plan Note (Signed)
-   Continue PPI - allergy shots - trial empiric prednisone burst and taper

## 2012-01-22 NOTE — Assessment & Plan Note (Signed)
Now s/p 6 months ethambutol and erythromycin, completed 10/13 - repeat CT scan chest in 08/2012

## 2012-01-22 NOTE — Progress Notes (Signed)
Patient ID: [name], female    DOB: 06-23-1945, 66 y.o.   MRN: [id number] HPI Ms. Malone is a 66 year old woman whom I have followed for chronic cough. 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 Evaluated 2011 by Dr Maple Hudson for allergies - shows multiple sensitivities that are present year round.   ROV 07/12/09 -- returns for f/u. Tells me that her PND has actually improved since last visit. Her regimen is benadryl 25mg  two times a day, NSW two times a day, flutter valve two times a day - sometimes productive but often not. Also on rotating doxy + azithromycin. No flares of bronchiectasis since last visit. She is contemplating starting allergy shots, hopefully can get them from her PCP.   ROV 10/01/09 -- f/u bronchiectasis, cough. Flare that started about a week ago. Began treatment with Avelox and set up this follow up. Was exposed to husband who had a URI. Changed her benadryl to Claritin D. She has not started allergy shots yet. Starting to feel better, not back to baseline. never started Singulair.   ROV 01/24/10 -- Hx chronic cough, bronchiectasis. Tried daliresp, couldn't tolerate due to insomnia. Still with clear drainage, never responded to treatment with nasal steroid. On benadryl qhs. Taking alternating doxy and azithro monthly. Considering starting allergy shots - discussing w Dr Maple Hudson.   ROV 08/05/10 -- bronchiectasis, chronic cough, severe allergic rhinitis. Doing NSW qd and flutter qd.  She is on allergy shots and nasal gtt may be better. She is alternating doxy with azithro at the beginning of every month. Her cough is predicatble - morning and afternoon. Worst if she has been off abx for an extended period. Last CT scan was 2008.   10/19/10-  66 yo F  Never smoker followed for bronchiectasis/ hx M.avium, chronic cough, severe allergic rhinitis Started allergy vaccine in January, now at 1:50 with occasional minimal  local soreness but no problems. Says the spring pollen season was "rough"- increased nasal stuffiness and cough. Has been better in last 2 months. Still takes benadryl once each night. Hx of trying many nasal sprays. Still some nasal congestion and drip at times. She is interested in getting her shots in [place] at her PCP. allergy profile- IgE 2.4, negative common allergens. Negative Fungal antibody panel  01/20/11-  66 yo F  Never smoker followed for bronchiectasis/ hx M.avium, chronic cough (Dr Delton Coombes), severe allergic rhinitis She returns for followup of her allergy vaccine therapy begun in January 2012. She says she had 2 good weeks at the beginning of September but now feels nasal congestion in the morning with watery rhinorrhea later in the day "like a cold". She has not been taking antihistamines very often, but prefers Benadryl. Cough is productive of clear to yellow sputum with no blood or fever. Dr Delton Coombes is alternating antibiotics. Flu shot is pending with her primary physician  ROV 05/05/11 -- bronchiectasis, chronic cough, severe allergic rhinitis. Hx MAIC and also MRSA. She has started allergy shots w Dr Maple Hudson. She still has congestion and drainage, assoc w dry cough. She is alternating abx (doxy/azithro) beginning every month. The cough and sputum is bad between abx and she often has to start early. Doxy is better than azithro, but it is taking longer for it to be effective. She is wondering if she might be having some reflux.   ROV 06/21/11 -- bronchiectasis w hx MAIC + MRSA, chronic cough both due to bronchiectasis and to  UA irritation, severe allergic rhinitis. Returns after sputum cx >> shows MAIC, sensitivities not yet available. She continues to have cough that is most productive in the am and at the end of the day.   ROV 08/04/11 -- bronchiectasis w hx MAIC + MRSA, chronic cough both due to bronchiectasis and to UA irritation, severe allergic rhinitis. We started clarithro + ethambutol  for her [facility], started it on 4/15. She has the bitter taste in her mouth, had this last time. I had thought this was due to rifabutin, but possibly due to clarithro. Not currently on doxy. Since last time she thinks she had URI, now probably back to baseline.   ROV 11/13/11 -- bronchiectasis w hx MAIC + MRSA, chronic cough both due to bronchiectasis and to UA irritation, severe allergic rhinitis. Has been on clarithro/ethambutol since 07/17/11. She has less energy on the abx. She continues to have cough that is productive of either green or clear. The cough is most bothersome at night, can wake her from sleep. She coughs every morning - clears secretions. Then around mid-day she has another bout, then again in the evening.    ROV 01/22/12 -- bronchiectasis w hx MAIC + MRSA, chronic cough both due to bronchiectasis and to UA irritation, severe allergic rhinitis. We treated [facility] with erythromycin and ethambutol since April '13. She finished the abx 01/12/12 (6 months). She continues to have cough and UA irritation.  Her nasal gtt is better but not gone; has never benefited from nasal steroid, astelin. Needs repeat CT scan 08/2012.     Objective:   Filed Vitals:   01/22/12 1435  BP: 120/62  Pulse: 79  Temp: 98.8 F (37.1 C)   Gen: Pleasant, thin, in no distress,  normal affect  ENT: No lesions,  mouth clear,  oropharynx clear, no postnasal drip  Neck: No JVD, no TMG, no carotid bruits  Lungs: No use of accessory muscles, no dullness to percussion, clear without rales or rhonchi  Cardiovascular: RRR, heart sounds normal, no murmur or gallops, no peripheral edema  Musculoskeletal: No deformities, no cyanosis or clubbing  Neuro: alert, non focal  Skin: Warm, no lesions or rashes  Pulmonary Mycobacterium avium complex (MAC) infection Now s/p 6 months ethambutol and erythromycin, completed 10/13 - repeat CT scan chest in 08/2012  COUGH, CHRONIC - Continue PPI - allergy shots - trial  empiric prednisone burst and taper

## 2012-01-24 ENCOUNTER — Ambulatory Visit (INDEPENDENT_AMBULATORY_CARE_PROVIDER_SITE_OTHER): Payer: Medicare Other

## 2012-01-24 DIAGNOSIS — Z23 Encounter for immunization: Secondary | ICD-10-CM | POA: Diagnosis not present

## 2012-01-24 DIAGNOSIS — J309 Allergic rhinitis, unspecified: Secondary | ICD-10-CM | POA: Diagnosis not present

## 2012-01-31 ENCOUNTER — Ambulatory Visit (INDEPENDENT_AMBULATORY_CARE_PROVIDER_SITE_OTHER): Payer: Medicare Other

## 2012-01-31 DIAGNOSIS — J309 Allergic rhinitis, unspecified: Secondary | ICD-10-CM

## 2012-02-07 ENCOUNTER — Ambulatory Visit (INDEPENDENT_AMBULATORY_CARE_PROVIDER_SITE_OTHER): Payer: Medicare Other

## 2012-02-07 DIAGNOSIS — J309 Allergic rhinitis, unspecified: Secondary | ICD-10-CM | POA: Diagnosis not present

## 2012-02-14 ENCOUNTER — Ambulatory Visit (INDEPENDENT_AMBULATORY_CARE_PROVIDER_SITE_OTHER): Payer: Medicare Other

## 2012-02-14 ENCOUNTER — Other Ambulatory Visit: Payer: Self-pay | Admitting: Family Medicine

## 2012-02-14 DIAGNOSIS — M810 Age-related osteoporosis without current pathological fracture: Secondary | ICD-10-CM | POA: Diagnosis not present

## 2012-02-14 DIAGNOSIS — J42 Unspecified chronic bronchitis: Secondary | ICD-10-CM | POA: Diagnosis not present

## 2012-02-14 DIAGNOSIS — Z0000 Encounter for general adult medical examination without abnormal findings: Secondary | ICD-10-CM | POA: Diagnosis not present

## 2012-02-14 DIAGNOSIS — J309 Allergic rhinitis, unspecified: Secondary | ICD-10-CM | POA: Diagnosis not present

## 2012-02-21 ENCOUNTER — Ambulatory Visit (HOSPITAL_COMMUNITY)
Admission: RE | Admit: 2012-02-21 | Discharge: 2012-02-21 | Disposition: A | Discharge status: Home / Self Care | Payer: Medicare Other | Source: Ambulatory Visit | Attending: Family Medicine | Admitting: Family Medicine

## 2012-02-21 ENCOUNTER — Ambulatory Visit (INDEPENDENT_AMBULATORY_CARE_PROVIDER_SITE_OTHER): Payer: Medicare Other

## 2012-02-21 DIAGNOSIS — J309 Allergic rhinitis, unspecified: Secondary | ICD-10-CM

## 2012-02-21 DIAGNOSIS — M810 Age-related osteoporosis without current pathological fracture: Secondary | ICD-10-CM | POA: Diagnosis not present

## 2012-02-28 ENCOUNTER — Telehealth: Payer: Self-pay | Admitting: Emergency Medicine

## 2012-02-28 ENCOUNTER — Ambulatory Visit (INDEPENDENT_AMBULATORY_CARE_PROVIDER_SITE_OTHER): Payer: Medicare Other

## 2012-02-28 DIAGNOSIS — J309 Allergic rhinitis, unspecified: Secondary | ICD-10-CM | POA: Diagnosis not present

## 2012-02-28 MED ORDER — LEVOFLOXACIN 750 MG PO TABS: 750.0000 mg | ORAL_TABLET | Freq: Every day | ORAL | Status: DC

## 2012-02-28 NOTE — Telephone Encounter (Signed)
Please advise. Thanks.  

## 2012-02-28 NOTE — Telephone Encounter (Signed)
Spoke to pt and she states that she is coughing all day. She is getting up green mucus. Possible fever this morning. Also feeling pressure on her chest.

## 2012-02-28 NOTE — Telephone Encounter (Signed)
Can try levaquin 750mg  one a day for 5 days.  Let him know that past cultures have grown resistant organisms, and hopefully this will take care of it.

## 2012-02-28 NOTE — Telephone Encounter (Signed)
Pt aware of recs from [facility] and aware RX sent to [facility],[place].

## 2012-03-01 ENCOUNTER — Encounter: Payer: Self-pay | Admitting: Internal Medicine

## 2012-03-06 ENCOUNTER — Ambulatory Visit (INDEPENDENT_AMBULATORY_CARE_PROVIDER_SITE_OTHER): Payer: Medicare Other

## 2012-03-06 DIAGNOSIS — J309 Allergic rhinitis, unspecified: Secondary | ICD-10-CM | POA: Diagnosis not present

## 2012-03-13 ENCOUNTER — Ambulatory Visit (INDEPENDENT_AMBULATORY_CARE_PROVIDER_SITE_OTHER): Payer: Medicare Other

## 2012-03-13 DIAGNOSIS — J309 Allergic rhinitis, unspecified: Secondary | ICD-10-CM | POA: Diagnosis not present

## 2012-03-20 ENCOUNTER — Ambulatory Visit (INDEPENDENT_AMBULATORY_CARE_PROVIDER_SITE_OTHER): Payer: Medicare Other

## 2012-03-20 DIAGNOSIS — J309 Allergic rhinitis, unspecified: Secondary | ICD-10-CM | POA: Diagnosis not present

## 2012-03-25 ENCOUNTER — Ambulatory Visit (INDEPENDENT_AMBULATORY_CARE_PROVIDER_SITE_OTHER): Payer: Medicare Other

## 2012-03-25 DIAGNOSIS — J309 Allergic rhinitis, unspecified: Secondary | ICD-10-CM | POA: Diagnosis not present

## 2012-04-01 ENCOUNTER — Ambulatory Visit (INDEPENDENT_AMBULATORY_CARE_PROVIDER_SITE_OTHER): Payer: Medicare Other

## 2012-04-01 DIAGNOSIS — J309 Allergic rhinitis, unspecified: Secondary | ICD-10-CM | POA: Diagnosis not present

## 2012-04-10 ENCOUNTER — Ambulatory Visit (INDEPENDENT_AMBULATORY_CARE_PROVIDER_SITE_OTHER): Payer: Medicare Other

## 2012-04-10 DIAGNOSIS — J309 Allergic rhinitis, unspecified: Secondary | ICD-10-CM | POA: Diagnosis not present

## 2012-04-17 ENCOUNTER — Ambulatory Visit (INDEPENDENT_AMBULATORY_CARE_PROVIDER_SITE_OTHER): Payer: Medicare Other

## 2012-04-17 DIAGNOSIS — J309 Allergic rhinitis, unspecified: Secondary | ICD-10-CM | POA: Diagnosis not present

## 2012-04-19 DIAGNOSIS — J019 Acute sinusitis, unspecified: Secondary | ICD-10-CM | POA: Diagnosis not present

## 2012-04-19 DIAGNOSIS — J42 Unspecified chronic bronchitis: Secondary | ICD-10-CM | POA: Diagnosis not present

## 2012-04-24 ENCOUNTER — Ambulatory Visit: Payer: Medicare Other

## 2012-04-24 ENCOUNTER — Ambulatory Visit (INDEPENDENT_AMBULATORY_CARE_PROVIDER_SITE_OTHER): Payer: Medicare Other | Admitting: Emergency Medicine

## 2012-04-24 ENCOUNTER — Ambulatory Visit (INDEPENDENT_AMBULATORY_CARE_PROVIDER_SITE_OTHER): Payer: Medicare Other

## 2012-04-24 ENCOUNTER — Encounter: Payer: Self-pay | Admitting: Emergency Medicine

## 2012-04-24 VITALS — BP 122/70 | HR 78 | Temp 97.2°F | Ht 64.5 in | Wt 105.4 lb

## 2012-04-24 DIAGNOSIS — J309 Allergic rhinitis, unspecified: Secondary | ICD-10-CM

## 2012-04-24 DIAGNOSIS — J479 Bronchiectasis, uncomplicated: Secondary | ICD-10-CM

## 2012-04-24 NOTE — Progress Notes (Signed)
Patient ID: Mary [name], female    DOB: 11/13/1945, 67 y.o.   MRN: [id number] HPI Mary [name] is a 67 year old woman whom I have followed for chronic cough. She has bronchiectasis and severe vasomotor rhinitis. Cx data has shown recurrent sputum Mary [name] colonization. Evaluated 2011 by Dr Mary [name] for allergies - shows multiple sensitivities that are present year round.   ROV 07/12/09 -- returns for f/u. Tells me that her PND has actually improved since last visit. Her regimen is benadryl  two times a day, NSW two times a day, flutter valve two times a day - sometimes productive but often not. Also on rotating doxy + azithromycin. No flares of bronchiectasis since last visit. She is contemplating starting allergy shots, hopefully can get them from her PCP.   ROV 10/01/09 -- f/u bronchiectasis, cough. Flare that started about a week ago. Began treatment with Avelox and set up this follow up. Was exposed to husband who had a URI. Changed her benadryl to Claritin D. She has not started allergy shots yet. Starting to feel better, not back to baseline. never started Singulair.   ROV 01/24/10 -- Hx chronic cough, bronchiectasis. Tried daliresp, couldn't tolerate due to insomnia. Still with clear drainage, never responded to treatment with nasal steroid. On benadryl qhs. Taking alternating doxy and azithro monthly. Considering starting allergy shots - discussing w Dr Mary [name].   ROV 08/05/10 -- bronchiectasis, chronic cough, severe allergic rhinitis. Doing NSW qd and flutter qd.  She is on allergy shots and nasal gtt may be better. She is alternating doxy with azithro at the beginning of every month. Her cough is predicatble - morning and afternoon. Worst if she has been off abx for an extended period. Last CT scan was 2008.   10/19/10-  30 yo F  Never smoker followed for bronchiectasis/ hx M.avium, chronic cough, severe allergic rhinitis Started allergy vaccine in January, now at 1:50 with occasional minimal  local soreness but no problems. Says the spring pollen season was "rough"- increased nasal stuffiness and cough. Has been better in last 2 months. Still takes benadryl once each night. Hx of trying many nasal sprays. Still some nasal congestion and drip at times. She is interested in getting her shots in Mary [name] at her PCP. allergy profile- IgE 2.4, negative common allergens. Negative Fungal antibody panel  01/20/11-  38 yo F  Never smoker followed for bronchiectasis/ hx M.avium, chronic cough (Dr Mary [name]), severe allergic rhinitis She returns for followup of her allergy vaccine therapy begun in January 2012. She says she had 2 good weeks at the beginning of September but now feels nasal congestion in the morning with watery rhinorrhea later in the day "like a cold". She has not been taking antihistamines very often, but prefers Benadryl. Cough is productive of clear to yellow sputum with no blood or fever. Dr Mary [name] is alternating antibiotics. Flu shot is pending with her primary physician  ROV 05/05/11 -- bronchiectasis, chronic cough, severe allergic rhinitis. Hx Mary [name] and also Mary [name]. She has started allergy shots w Dr Mary [name]. She still has congestion and drainage, assoc w dry cough. She is alternating abx (doxy/azithro) beginning every month. The cough and sputum is bad between abx and she often has to start early. Doxy is better than azithro, but it is taking longer for it to be effective. She is wondering if she might be having some reflux.   ROV 06/21/11 -- bronchiectasis w hx Mary [name] + Mary [name], chronic cough both due to bronchiectasis and to  Mary [name], severe allergic rhinitis. Returns after sputum cx >> shows Mary [name], sensitivities not yet available. She continues to have cough that is most productive in the am and at the end of the day.   ROV 08/04/11 -- bronchiectasis w hx Mary [name] + Mary [name], chronic cough both due to bronchiectasis and to Mary [name], severe allergic rhinitis. We started clarithro + ethambutol  for her Mary [name], started it on 4/15. She has the bitter taste in her mouth, had this last time. I had thought this was due to rifabutin, but possibly due to clarithro. Not currently on doxy. Since last time she thinks she had URI, now probably back to baseline.   ROV 11/13/11 -- bronchiectasis w hx Mary [name] + Mary [name], chronic cough both due to bronchiectasis and to Mary [name], severe allergic rhinitis. Has been on clarithro/ethambutol since 07/17/11. She has less energy on the abx. She continues to have cough that is productive of either green or clear. The cough is most bothersome at night, can wake her from sleep. She coughs every morning - clears secretions. Then around mid-day she has another bout, then again in the evening.    ROV 01/22/12 -- bronchiectasis w hx Mary [name] + Mary [name], chronic cough both due to bronchiectasis and to Mary [name], severe allergic rhinitis. We treated Mary [name] with erythromycin and ethambutol since April '13. She finished the abx 01/12/12 (6 months). She continues to have cough and Mary [name].  Her nasal gtt is better but not gone; has never benefited from nasal steroid, astelin. Needs repeat CT scan 08/2012.   ROV 04/24/12 -- chronic cough from bronchiectasis as well as Mary [name] from allergies/rhinitis. Has been cx positive for Mary [name] and Mary [name] - treated but ? Eradicated. Last time treated with burst pred, has also been treated x 1 w levaquin in Nov '13. She returns today with nasal and throat congestion, cough. Starting last week more nasal and sinus congestion, viral URI sx, purulent mucous. Started levaquin again 5 days ago. She is doing nasal saline washes.     Objective:   Filed Vitals:   04/24/12 1406  BP: 122/70  Pulse: 78  Temp: 97.2 F (36.2 C)   Gen: Pleasant, thin, in no distress,  normal affect  ENT: No lesions,  mouth clear,  oropharynx clear, no postnasal drip  Neck: No JVD, no TMG, no carotid bruits  Lungs: No use of accessory muscles, no dullness to  percussion, clear without rales or rhonchi  Cardiovascular: RRR, heart sounds normal, no murmur or gallops, no peripheral edema  Musculoskeletal: No deformities, no cyanosis or clubbing  Neuro: alert, non focal  Skin: Warm, no lesions or rashes  BRONCHIECTASIS Please finish your Levaquin  Continue your nasal saline washes Start using decongestants that contain chlorpheniramine or brompheniramine as directed Call our office after your Levaquin is finished to review your improvement  We will repeat your CT scan in 08/2012.  Follow with Dr Mary [name] in 3 months or sooner if you have any problems.

## 2012-04-24 NOTE — Assessment & Plan Note (Signed)
Please finish your Levaquin  Continue your nasal saline washes Start using decongestants that contain chlorpheniramine or brompheniramine as directed Call our office after your Levaquin is finished to review your improvement  We will repeat your CT scan in 08/2012.  Follow with Dr Delton Coombes in 3 months or sooner if you have any problems.

## 2012-04-24 NOTE — Patient Instructions (Addendum)
Please finish your Levaquin  Continue your nasal saline washes Start using decongestants that contain chlorpheniramine or brompheniramine as directed Call our office after your Levaquin is finished to review your improvement  We will repeat your CT scan in 08/2012.  Follow with Dr Chevy Virgo in 3 months or sooner if you have any problems. 

## 2012-04-26 ENCOUNTER — Ambulatory Visit (INDEPENDENT_AMBULATORY_CARE_PROVIDER_SITE_OTHER): Payer: Medicare Other

## 2012-04-26 DIAGNOSIS — J309 Allergic rhinitis, unspecified: Secondary | ICD-10-CM

## 2012-04-30 ENCOUNTER — Ambulatory Visit (INDEPENDENT_AMBULATORY_CARE_PROVIDER_SITE_OTHER): Payer: Medicare Other

## 2012-04-30 DIAGNOSIS — J309 Allergic rhinitis, unspecified: Secondary | ICD-10-CM | POA: Diagnosis not present

## 2012-05-01 ENCOUNTER — Ambulatory Visit (INDEPENDENT_AMBULATORY_CARE_PROVIDER_SITE_OTHER): Payer: Medicare Other

## 2012-05-01 ENCOUNTER — Ambulatory Visit: Payer: Medicare Other

## 2012-05-01 ENCOUNTER — Telehealth: Payer: Self-pay | Admitting: Emergency Medicine

## 2012-05-01 DIAGNOSIS — J309 Allergic rhinitis, unspecified: Secondary | ICD-10-CM

## 2012-05-01 NOTE — Telephone Encounter (Signed)
LMOM TCB x1 on the home and cell numbers each.

## 2012-05-01 NOTE — Telephone Encounter (Signed)
Pt returned call.  Advised of RB's recs as stated below.  Pt stated that she has already finished the abx this past weekend (1/25-1/26) and has yet to begin a decongestant.  Pt will begin this for next 3-5 days and if she does not improve or her symptoms worsen, she will call back with update.  Nothing further needed at this time; will sign and forward to RB as FYI.

## 2012-05-01 NOTE — Telephone Encounter (Signed)
I spoke with pt. She is calling after finishing her abx. She is still coughing up green-brown phlem and occasionally mixed with blood (does not happen very often), some wheezing, chest heaviness, chest congestion is better, nasal congestion, PND. She started sudafed and still doing saline nasal washes. She is wanting to know how long she needs to continue the decongestant. Please advise RB thanks  Allergies  Allergen Reactions  . Sulfamethoxazole W-Trimethoprim     REACTION: Rash

## 2012-05-01 NOTE — Telephone Encounter (Signed)
I would have her continue the decongestant another 4-5 days after the abx are finished. If her cough and congestion don't return to her usual baseline then she may need an alternative antibiotic. Please have her keep [place] updated on her progress.

## 2012-05-03 ENCOUNTER — Ambulatory Visit: Payer: Medicare Other

## 2012-05-08 ENCOUNTER — Ambulatory Visit (INDEPENDENT_AMBULATORY_CARE_PROVIDER_SITE_OTHER): Payer: Medicare Other

## 2012-05-08 ENCOUNTER — Ambulatory Visit: Payer: Medicare Other

## 2012-05-08 DIAGNOSIS — J309 Allergic rhinitis, unspecified: Secondary | ICD-10-CM

## 2012-05-15 ENCOUNTER — Ambulatory Visit (INDEPENDENT_AMBULATORY_CARE_PROVIDER_SITE_OTHER): Payer: Medicare Other

## 2012-05-15 ENCOUNTER — Ambulatory Visit: Payer: Medicare Other

## 2012-05-15 DIAGNOSIS — J309 Allergic rhinitis, unspecified: Secondary | ICD-10-CM

## 2012-05-22 ENCOUNTER — Ambulatory Visit (INDEPENDENT_AMBULATORY_CARE_PROVIDER_SITE_OTHER): Payer: Medicare Other

## 2012-05-22 DIAGNOSIS — J309 Allergic rhinitis, unspecified: Secondary | ICD-10-CM

## 2012-05-27 ENCOUNTER — Encounter: Payer: Self-pay | Admitting: Internal Medicine

## 2012-05-29 ENCOUNTER — Ambulatory Visit (INDEPENDENT_AMBULATORY_CARE_PROVIDER_SITE_OTHER): Payer: Medicare Other

## 2012-05-29 DIAGNOSIS — J309 Allergic rhinitis, unspecified: Secondary | ICD-10-CM | POA: Diagnosis not present

## 2012-06-01 ENCOUNTER — Emergency Department (HOSPITAL_COMMUNITY): Payer: Medicare Other

## 2012-06-01 ENCOUNTER — Encounter (HOSPITAL_COMMUNITY): Payer: Self-pay | Admitting: *Deleted

## 2012-06-01 ENCOUNTER — Emergency Department (HOSPITAL_COMMUNITY)
Admission: EM | Admit: 2012-06-01 | Discharge: 2012-06-01 | Disposition: A | Discharge status: Home / Self Care | Payer: Medicare Other | Attending: Emergency Medicine | Admitting: Emergency Medicine

## 2012-06-01 DIAGNOSIS — Z7982 Long term (current) use of aspirin: Secondary | ICD-10-CM | POA: Diagnosis not present

## 2012-06-01 DIAGNOSIS — S79929A Unspecified injury of unspecified thigh, initial encounter: Secondary | ICD-10-CM | POA: Diagnosis not present

## 2012-06-01 DIAGNOSIS — M25559 Pain in unspecified hip: Secondary | ICD-10-CM | POA: Diagnosis not present

## 2012-06-01 DIAGNOSIS — Z79899 Other long term (current) drug therapy: Secondary | ICD-10-CM | POA: Diagnosis not present

## 2012-06-01 DIAGNOSIS — Y929 Unspecified place or not applicable: Secondary | ICD-10-CM | POA: Insufficient documentation

## 2012-06-01 DIAGNOSIS — Z8619 Personal history of other infectious and parasitic diseases: Secondary | ICD-10-CM | POA: Diagnosis not present

## 2012-06-01 DIAGNOSIS — M810 Age-related osteoporosis without current pathological fracture: Secondary | ICD-10-CM | POA: Diagnosis not present

## 2012-06-01 DIAGNOSIS — Z8709 Personal history of other diseases of the respiratory system: Secondary | ICD-10-CM | POA: Insufficient documentation

## 2012-06-01 DIAGNOSIS — W010XXA Fall on same level from slipping, tripping and stumbling without subsequent striking against object, initial encounter: Secondary | ICD-10-CM | POA: Insufficient documentation

## 2012-06-01 DIAGNOSIS — Y9301 Activity, walking, marching and hiking: Secondary | ICD-10-CM | POA: Insufficient documentation

## 2012-06-01 DIAGNOSIS — S329XXA Fracture of unspecified parts of lumbosacral spine and pelvis, initial encounter for closed fracture: Secondary | ICD-10-CM

## 2012-06-01 DIAGNOSIS — S79919A Unspecified injury of unspecified hip, initial encounter: Secondary | ICD-10-CM | POA: Diagnosis not present

## 2012-06-01 MED ORDER — OXYCODONE-ACETAMINOPHEN 5-325 MG PO TABS: 1.0000 | ORAL_TABLET | ORAL | Status: DC | PRN

## 2012-06-01 MED ORDER — HYDROMORPHONE HCL PF 1 MG/ML IJ SOLN
0.5000 mg | Freq: Once | INTRAMUSCULAR | Status: AC
  Administered 2012-06-01: 0.5 mg via INTRAMUSCULAR
  Filled 2012-06-01: qty 1

## 2012-06-01 MED ORDER — ONDANSETRON HCL 4 MG/2ML IJ SOLN
4.0000 mg | Freq: Once | INTRAMUSCULAR | Status: DC
  Filled 2012-06-01: qty 2

## 2012-06-01 MED ORDER — ONDANSETRON 8 MG PO TBDP
8.0000 mg | ORAL_TABLET | Freq: Once | ORAL | Status: AC
  Administered 2012-06-01: 8 mg via ORAL
  Filled 2012-06-01: qty 1

## 2012-06-01 NOTE — ED Provider Notes (Signed)
History     CSN: [id number]  Arrival date & time 06/01/12  1952   First MD Initiated Contact with Patient 06/01/12 1955      Chief Complaint  Patient presents with  . Fall  . Hip Injury    (Consider location/radiation/quality/duration/timing/severity/associated sxs/prior treatment) HPI Comments: Patient presents to the ER for evaluation of left hip injury from a fall. Injury occurred just prior to arrival. Patient reports that she was walking and tripped, fell onto her left side. She is complaining of pain in the left hip and behind the left hip area. Pain worsens with movement. Pain is moderate to severe. She is having trouble bearing weight. There was no head injury. Patient denies neck and back pain.  Patient is a 67 y.o. female presenting with fall.  Fall    Past Medical History  Diagnosis Date  . Osteoporosis   . Allergic rhinitis   . Mycobacterium avium complex   . Bronchiectasis   . Chronic cough     Past Surgical History  Procedure Laterality Date  . Neck surgery    . Breast biopsy      left    Family History  Problem Relation Age of Onset  . Allergies Mother   . Heart disease Mother   . Heart disease Father   . Stroke Father     History  Substance Use Topics  . Smoking status: Never Smoker   . Smokeless tobacco: Never Used  . Alcohol Use: No    OB History   Grav Para Term Preterm Abortions TAB SAB Ect Mult Living                  Review of Systems  Musculoskeletal: Negative for back pain.  All other systems reviewed and are negative.    Allergies  Sulfamethoxazole w-trimethoprim  Home Medications   Current Outpatient Rx  Name  Route  Sig  Dispense  Refill  . alendronate (FOSAMAX) 10 MG tablet   Oral   Take 10 mg by mouth every 7 (seven) days. Take with a full glass of water on an empty stomach.          Marland Kitchen aspirin 81 MG tablet   Oral   Take 81 mg by mouth daily.          . diphenhydrAMINE (SOMINEX) 25 MG tablet   Oral  Take 50 mg by mouth at bedtime.          Marland Kitchen levofloxacin (LEVAQUIN) 750 MG tablet   Oral   Take 1 tablet (750 mg total) by mouth daily.   5 tablet   0   . Multiple Vitamins-Minerals (ONE-A-DAY 50 PLUS PO)   Oral   Take 1 tablet by mouth daily.           Marland Kitchen omeprazole (PRILOSEC) 20 MG capsule   Oral   Take 1 capsule (20 mg total) by mouth daily.   40 capsule   5     There were no vitals taken for this visit.  Physical Exam  Constitutional: She is oriented to person, place, and time. She appears well-developed and well-nourished. No distress.  HENT:  Head: Normocephalic and atraumatic.  Right Ear: Hearing normal.  Nose: Nose normal.  Mouth/Throat: Oropharynx is clear and moist and mucous membranes are normal.  Eyes: Conjunctivae and EOM are normal. Pupils are equal, round, and reactive to light.  Neck: Normal range of motion. Neck supple.  Cardiovascular: Normal rate, regular rhythm, S1 normal and  S2 normal.  Exam reveals no gallop and no friction rub.   No murmur heard. Pulses:      Dorsalis pedis pulses are 1+ on the right side, and 1+ on the left side.  Pulmonary/Chest: Effort normal and breath sounds normal. No respiratory distress. She exhibits no tenderness.  Abdominal: Soft. Normal appearance and bowel sounds are normal. There is no hepatosplenomegaly. There is no tenderness. There is no rebound, no guarding, no tenderness at McBurney's point and negative Murphy's sign. No hernia.  Musculoskeletal:       Left hip: She exhibits decreased range of motion and tenderness. She exhibits no deformity.  Neurological: She is alert and oriented to person, place, and time. She has normal strength. No cranial nerve deficit or sensory deficit. Coordination normal. GCS eye subscore is 4. GCS verbal subscore is 5. GCS motor subscore is 6.  Skin: Skin is warm, dry and intact. No rash noted. No cyanosis.  Psychiatric: She has a normal mood and affect. Her speech is normal and behavior  is normal. Thought content normal.    ED Course  Procedures (including critical care time)  Labs Reviewed - No data to display Dg Hip Complete Left  06/01/2012  *RADIOLOGY REPORT*  Clinical Data: 67 year old female hip and groin pain after fall.  LEFT HIP - COMPLETE 2+ VIEW  Comparison: None.  Findings: Femoral heads normally located.  Bilateral hip joint space loss and subchondral sclerosis.  Proximal left femur intact.  Left superior and inferior pubic rami fractures.  The superior ramus fracture could be subacute or chronic.  The inferior ramus fracture is minimally-displaced.  Pelvis elsewhere appears intact.  Nonspecific increased density in the pelvis which might be related to bladder distention.  IMPRESSION: 1.  No acute fracture identified but there are fractures of the left inferior and superior pubic rami.  The superior pubic ramus fracture could also be nonacute. 2.  Bilateral hip joint osteoarthritis.   Original Report Authenticated By: Erskine Speed, M.D.      Diagnosis: Pelvic fracture    MDM  Patient comes to the ER with complaints of pain in the left hip area after a fall. Pain is actually behind the hip and buttock region, mostly. Hip x-ray was performed and does show inferior and superior pubic rami fracture. This is a weightbearing injury. Patient will be treated with analgesia, rest and followup with orthopedics.        Gilda Crease, MD 06/01/12 2044

## 2012-06-01 NOTE — ED Notes (Signed)
Pt back from radiology, states she is comfortable at this time. Denies pain while laying still.

## 2012-06-01 NOTE — ED Notes (Signed)
Pt tripped over someone and she fell on her left hip. Pt c/o left hip pain.

## 2012-06-01 NOTE — ED Notes (Signed)
Pt fell onto left hip tonight at a social event, states "I feel like its out of socket" no obvious deformity, shortening, or rotation is noted, normal pulses and sensation to affected extremity. Pt able to move hip normally but with pain. Pt aax4, denies pain when laying still. Pt in process of changing into gown and will get xrays.

## 2012-06-04 ENCOUNTER — Emergency Department (HOSPITAL_COMMUNITY): Payer: Medicare Other

## 2012-06-04 ENCOUNTER — Emergency Department (HOSPITAL_COMMUNITY)
Admission: EM | Admit: 2012-06-04 | Discharge: 2012-06-04 | Disposition: A | Discharge status: Home / Self Care | Payer: Medicare Other | Attending: Emergency Medicine | Admitting: Emergency Medicine

## 2012-06-04 ENCOUNTER — Encounter (HOSPITAL_COMMUNITY): Payer: Self-pay

## 2012-06-04 DIAGNOSIS — Z79899 Other long term (current) drug therapy: Secondary | ICD-10-CM | POA: Insufficient documentation

## 2012-06-04 DIAGNOSIS — Z8619 Personal history of other infectious and parasitic diseases: Secondary | ICD-10-CM | POA: Diagnosis not present

## 2012-06-04 DIAGNOSIS — Z7982 Long term (current) use of aspirin: Secondary | ICD-10-CM | POA: Diagnosis not present

## 2012-06-04 DIAGNOSIS — R296 Repeated falls: Secondary | ICD-10-CM | POA: Insufficient documentation

## 2012-06-04 DIAGNOSIS — S329XXS Fracture of unspecified parts of lumbosacral spine and pelvis, sequela: Secondary | ICD-10-CM

## 2012-06-04 DIAGNOSIS — M810 Age-related osteoporosis without current pathological fracture: Secondary | ICD-10-CM | POA: Diagnosis not present

## 2012-06-04 DIAGNOSIS — S329XXA Fracture of unspecified parts of lumbosacral spine and pelvis, initial encounter for closed fracture: Secondary | ICD-10-CM | POA: Diagnosis not present

## 2012-06-04 DIAGNOSIS — Y939 Activity, unspecified: Secondary | ICD-10-CM | POA: Insufficient documentation

## 2012-06-04 DIAGNOSIS — S32509A Unspecified fracture of unspecified pubis, initial encounter for closed fracture: Secondary | ICD-10-CM | POA: Diagnosis not present

## 2012-06-04 DIAGNOSIS — Z8709 Personal history of other diseases of the respiratory system: Secondary | ICD-10-CM | POA: Insufficient documentation

## 2012-06-04 DIAGNOSIS — Y929 Unspecified place or not applicable: Secondary | ICD-10-CM | POA: Insufficient documentation

## 2012-06-04 MED ORDER — ONDANSETRON HCL 4 MG/2ML IJ SOLN
4.0000 mg | Freq: Once | INTRAMUSCULAR | Status: AC
  Administered 2012-06-04: 4 mg via INTRAVENOUS
  Filled 2012-06-04: qty 2

## 2012-06-04 MED ORDER — CYCLOBENZAPRINE HCL 10 MG PO TABS: 10.0000 mg | ORAL_TABLET | Freq: Three times a day (TID) | ORAL | Status: DC | PRN

## 2012-06-04 MED ORDER — HYDROMORPHONE HCL PF 1 MG/ML IJ SOLN
1.0000 mg | Freq: Once | INTRAMUSCULAR | Status: AC
  Administered 2012-06-04: 1 mg via INTRAVENOUS
  Filled 2012-06-04: qty 1

## 2012-06-04 MED ORDER — HYDROMORPHONE HCL 4 MG PO TABS: 4.0000 mg | ORAL_TABLET | Freq: Four times a day (QID) | ORAL | Status: DC | PRN

## 2012-06-04 MED FILL — Oxycodone w/ Acetaminophen Tab 5-325 MG: ORAL | Qty: 6 | Status: AC

## 2012-06-04 NOTE — ED Notes (Signed)
Pt fell last Saturday, has known pelvic fx. On left. Is on percocet for pain, last dose, 8am today, arrived from home by ems. Pain comes and goes. Describes as spasms.

## 2012-06-04 NOTE — ED Provider Notes (Signed)
History  This chart was scribed for [name], MD by Bennett Scrape, ED Scribe. This patient was seen in room APA11/APA11 and the patient's care was started at 10:36 AM.  CSN: [id number]  Arrival date & time 06/04/12  1016   First MD Initiated Contact with Patient 06/04/12 1036      Chief Complaint  Patient presents with  . Pain    fell sat. has known pelvic fx.    Patient is a 67 y.o. female presenting with hip pain. The history is provided by the patient. No language interpreter was used.  Hip Pain Pertinent negatives include no chest pain, no abdominal pain and no headaches.    Mary [name] is a 67 y.o. female brought in by ambulance, who presents to the Emergency Department complaining of sudden onset, gradually worsening, intermittent left hip pain described as cramps after a fall 3 days ago. Pt states that she was evaluated in the ED and diagnosed with a fracture on the left. She was sent home with percocet for pain, last dose was 3 hours ago, but states that the pain has increased with movement and she has been unable to ambulate since. She reports that she has appointment today with Dr. Jillyn Hidden, ortho, for a follow up but wanted to be seen sooner. She denies any other complaints currently. She has a h/o osteoporosis and denies smoking and alcohol use.  PCP is Dr. Lubertha South  Past Medical History  Diagnosis Date  . Osteoporosis   . Allergic rhinitis   . Mycobacterium avium complex   . Bronchiectasis   . Chronic cough     Past Surgical History  Procedure Laterality Date  . Neck surgery    . Breast biopsy      left    Family History  Problem Relation Age of Onset  . Allergies Mother   . Heart disease Mother   . Heart disease Father   . Stroke Father     History  Substance Use Topics  . Smoking status: Never Smoker   . Smokeless tobacco: Never Used  . Alcohol Use: No    No OB history provided.  Review of Systems  Constitutional: Negative for  fatigue.  HENT: Negative for congestion, sinus pressure and ear discharge.   Eyes: Negative for discharge.  Respiratory: Negative for cough.   Cardiovascular: Negative for chest pain.  Gastrointestinal: Negative for abdominal pain and diarrhea.  Genitourinary: Negative for frequency and hematuria.  Musculoskeletal: Positive for arthralgias (left hip pain). Negative for back pain.  Skin: Negative for rash.  Neurological: Negative for seizures and headaches.  Psychiatric/Behavioral: Negative for hallucinations.    Allergies  Sulfamethoxazole w-trimethoprim  Home Medications   Current Outpatient Rx  Name  Route  Sig  Dispense  Refill  . alendronate (FOSAMAX) 10 MG tablet   Oral   Take 10 mg by mouth every 7 (seven) days. Take with a full glass of water on an empty stomach.onSaturday         . aspirin 81 MG tablet   Oral   Take 81 mg by mouth daily.          . cephALEXin (KEFLEX) 250 MG capsule   Oral   Take 250 mg by mouth at bedtime.         . Multiple Vitamins-Minerals (ONE-A-DAY 50 PLUS PO)   Oral   Take 1 tablet by mouth daily.           Marland Kitchen oxyCODONE-acetaminophen (PERCOCET)  5-325 MG per tablet   Oral   Take 1 tablet by mouth every 4 (four) hours as needed for pain.   6 tablet   0     Triage Vitals: BP 143/64  Pulse 88  Temp(Src) 98.1 F (36.7 C) (Oral)  Resp 18  SpO2 95%  Physical Exam  Nursing note and vitals reviewed. Constitutional: She is oriented to person, place, and time. She appears well-developed and well-nourished. No distress.  HENT:  Head: Normocephalic and atraumatic.  Eyes: Conjunctivae are normal.  Neck: No tracheal deviation present.  Cardiovascular: Normal rate.   No murmur heard. Pulmonary/Chest: No respiratory distress.  Musculoskeletal:  Tender along the left lateral hip to palpation and with ROM  Neurological: She is alert and oriented to person, place, and time.  Skin: Skin is warm and dry.  Psychiatric: She has a normal  mood and affect. Her behavior is normal.    ED Course  Procedures (including critical care time)  DIAGNOSTIC STUDIES: Oxygen Saturation is 95% on room air, adequate by my interpretation.    COORDINATION OF CARE: 10:40 AM-Discussed treatment plan which includes CT scan of pelvis and pain medication with pt at bedside and pt agreed to plan.   10:45 AM- Ordered 1 mg Dilaudid injection and 4 mg Zofran injection  Labs Reviewed - No data to display No results found.   No diagnosis found.   I spoke with dr. Charlann Boxer who will see the pt in a month MDM    The chart was scribed for me under my direct supervision.  I personally performed the history, physical, and medical decision making and all procedures in the evaluation of this patient.[name], MD 06/04/12 [phone]

## 2012-06-05 ENCOUNTER — Ambulatory Visit: Payer: Medicare Other

## 2012-06-12 DIAGNOSIS — M79609 Pain in unspecified limb: Secondary | ICD-10-CM | POA: Diagnosis not present

## 2012-06-12 DIAGNOSIS — IMO0001 Reserved for inherently not codable concepts without codable children: Secondary | ICD-10-CM | POA: Diagnosis not present

## 2012-06-12 DIAGNOSIS — R609 Edema, unspecified: Secondary | ICD-10-CM | POA: Diagnosis not present

## 2012-06-13 DIAGNOSIS — IMO0001 Reserved for inherently not codable concepts without codable children: Secondary | ICD-10-CM | POA: Diagnosis not present

## 2012-06-14 DIAGNOSIS — IMO0001 Reserved for inherently not codable concepts without codable children: Secondary | ICD-10-CM | POA: Diagnosis not present

## 2012-06-18 DIAGNOSIS — IMO0001 Reserved for inherently not codable concepts without codable children: Secondary | ICD-10-CM | POA: Diagnosis not present

## 2012-06-20 DIAGNOSIS — IMO0001 Reserved for inherently not codable concepts without codable children: Secondary | ICD-10-CM

## 2012-06-20 DIAGNOSIS — J479 Bronchiectasis, uncomplicated: Secondary | ICD-10-CM

## 2012-06-20 DIAGNOSIS — S329XXA Fracture of unspecified parts of lumbosacral spine and pelvis, initial encounter for closed fracture: Secondary | ICD-10-CM

## 2012-06-21 ENCOUNTER — Encounter: Payer: Self-pay | Admitting: *Deleted

## 2012-06-25 ENCOUNTER — Telehealth: Payer: Self-pay | Admitting: Family Medicine

## 2012-06-25 NOTE — Telephone Encounter (Signed)
Prior message went out concerning Mary [name] office visit with ortho. Enid Derry has the patient information and is currently assisting the patient at this time. VOID out prior message - thanks in advance, [place]

## 2012-06-25 NOTE — Telephone Encounter (Signed)
[id number] Salls [birth year]b [birth year] [id number] had a accident - confusion about date for orthopedic appt.dates. Mary [name] missed her date with the office appt. Mary [name] wanted to know what is the status with the follow-up with [facility] - Dr. Charlann Boxer. CB: [phone] 9:14am

## 2012-06-26 DIAGNOSIS — IMO0001 Reserved for inherently not codable concepts without codable children: Secondary | ICD-10-CM | POA: Diagnosis not present

## 2012-07-03 ENCOUNTER — Ambulatory Visit (INDEPENDENT_AMBULATORY_CARE_PROVIDER_SITE_OTHER): Payer: Medicare Other | Admitting: Internal Medicine

## 2012-07-03 ENCOUNTER — Encounter: Payer: Self-pay | Admitting: Internal Medicine

## 2012-07-03 ENCOUNTER — Ambulatory Visit (INDEPENDENT_AMBULATORY_CARE_PROVIDER_SITE_OTHER): Payer: Medicare Other

## 2012-07-03 VITALS — BP 122/60 | HR 86 | Ht 64.0 in | Wt 102.6 lb

## 2012-07-03 DIAGNOSIS — J309 Allergic rhinitis, unspecified: Secondary | ICD-10-CM

## 2012-07-03 DIAGNOSIS — J301 Allergic rhinitis due to pollen: Secondary | ICD-10-CM

## 2012-07-03 DIAGNOSIS — J329 Chronic sinusitis, unspecified: Secondary | ICD-10-CM | POA: Diagnosis not present

## 2012-07-03 NOTE — Assessment & Plan Note (Signed)
Ok to resume allergy vaccine with appropriate dose adjustment after hiatus, now that she can travel.  Seasonal increased symptoms, partly due to being off vaccine.  Plan: resume vaccine, sample [place], CT sinuses at her request for concern of chronic sinusitis- discussed.

## 2012-07-03 NOTE — Progress Notes (Signed)
Patient ID: Mary [name], female    DOB: 06-23-1945, 67 y.o.   MRN: [id number] HPI [name] is a 67 year old woman whom I have followed for chronic cough. 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 Evaluated 2011 by Dr Maple Hudson for allergies - shows multiple sensitivities that are present year round.   ROV 07/12/09 -- returns for f/u. Tells me that her PND has actually improved since last visit. Her regimen is benadryl 25mg  two times a day, NSW two times a day, flutter valve two times a day - sometimes productive but often not. Also on rotating doxy + azithromycin. No flares of bronchiectasis since last visit. She is contemplating starting allergy shots, hopefully can get them from her PCP.   ROV 10/01/09 -- f/u bronchiectasis, cough. Flare that started about a week ago. Began treatment with Avelox and set up this follow up. Was exposed to husband who had a URI. Changed her benadryl to Claritin D. She has not started allergy shots yet. Starting to feel better, not back to baseline. never started Singulair.   ROV 01/24/10 -- Hx chronic cough, bronchiectasis. Tried daliresp, couldn't tolerate due to insomnia. Still with clear drainage, never responded to treatment with nasal steroid. On benadryl qhs. Taking alternating doxy and azithro monthly. Considering starting allergy shots - discussing w Dr Maple Hudson.   ROV 08/05/10 -- bronchiectasis, chronic cough, severe allergic rhinitis. Doing NSW qd and flutter qd.  She is on allergy shots and nasal gtt may be better. She is alternating doxy with azithro at the beginning of every month. Her cough is predicatble - morning and afternoon. Worst if she has been off abx for an extended period. Last CT scan was 2008.   10/19/10-  67 yo F  Never smoker followed for bronchiectasis/ hx M.avium, chronic cough, severe allergic rhinitis Started allergy vaccine in January, now at 1:50 with occasional minimal  local soreness but no problems. Says the spring pollen season was "rough"- increased nasal stuffiness and cough. Has been better in last 2 months. Still takes benadryl once each night. Hx of trying many nasal sprays. Still some nasal congestion and drip at times. She is interested in getting her shots in [place] at her PCP. allergy profile- IgE 2.4, negative common allergens. Negative Fungal antibody panel  01/20/11-  67 yo F  Never smoker followed for bronchiectasis/ hx M.avium, chronic cough (Dr Delton Coombes), severe allergic rhinitis She returns for followup of her allergy vaccine therapy begun in January 2012. She says she had 2 good weeks at the beginning of September but now feels nasal congestion in the morning with watery rhinorrhea later in the day "like a cold". She has not been taking antihistamines very often, but prefers Benadryl. Cough is productive of clear to yellow sputum with no blood or fever. Dr Delton Coombes is alternating antibiotics. Flu shot is pending with her primary physician  ROV 05/05/11 -- bronchiectasis, chronic cough, severe allergic rhinitis. Hx MAIC and also MRSA. She has started allergy shots w Dr Maple Hudson. She still has congestion and drainage, assoc w dry cough. She is alternating abx (doxy/azithro) beginning every month. The cough and sputum is bad between abx and she often has to start early. Doxy is better than azithro, but it is taking longer for it to be effective. She is wondering if she might be having some reflux.   ROV 06/21/11 -- bronchiectasis w hx MAIC + MRSA, chronic cough both due to bronchiectasis and to  UA irritation, severe allergic rhinitis. Returns after sputum cx >> shows MAIC, sensitivities not yet available. She continues to have cough that is most productive in the am and at the end of the day.   ROV 08/04/11 -- bronchiectasis w hx MAIC + MRSA, chronic cough both due to bronchiectasis and to UA irritation, severe allergic rhinitis. We started clarithro + ethambutol  for her [facility], started it on 4/15. She has the bitter taste in her mouth, had this last time. I had thought this was due to rifabutin, but possibly due to clarithro. Not currently on doxy. Since last time she thinks she had URI, now probably back to baseline.   ROV 11/13/11 -- bronchiectasis w hx MAIC + MRSA, chronic cough both due to bronchiectasis and to UA irritation, severe allergic rhinitis. Has been on clarithro/ethambutol since 07/17/11. She has less energy on the abx. She continues to have cough that is productive of either green or clear. The cough is most bothersome at night, can wake her from sleep. She coughs every morning - clears secretions. Then around mid-day she has another bout, then again in the evening.    ROV 01/22/12 -- bronchiectasis w hx MAIC + MRSA, chronic cough both due to bronchiectasis and to UA irritation, severe allergic rhinitis. We treated [facility] with erythromycin and ethambutol since April '13. She finished the abx 01/12/12 (6 months). She continues to have cough and UA irritation.  Her nasal gtt is better but not gone; has never benefited from nasal steroid, astelin. Needs repeat CT scan 08/2012.   07/03/12- 67 yo F  Never smoker followed for bronchiectasis/ hx M.avium, chronic cough (Dr Delton Coombes), severe allergic rhinitis FOLLOWS FOR: sneezing and runny nose; cough and drainage as well. Still on allergy vaccine Missed her allergy shots 1:10 GH for month of March after fall with cracked pelvis.  Incvreased sneeze, watery rhinorhea, much post-nasal drip. No headache, ears, purulent. Blames PND for increased cough from her bronchiectasis.  Taking a Sudafed-PE product containing an antihistamine, nasal saline irrigation.  ROS-see HPI Constitutional:   No-   weight loss, night sweats, fevers, chills, fatigue, lassitude. HEENT:   No-  headaches, No-difficulty swallowing, tooth/dental problems, sore throat,       + sneezing, itching, ear ache, +nasal congestion, +post nasal drip,   CV:  No-   chest pain, orthopnea, PND, swelling in lower extremities, anasarca,                                  dizziness, palpitations Resp: +shortness of breath with exertion or at rest.              +   productive cough,  + non-productive cough,  No- coughing up of blood.              No-   change in color of mucus.  No- wheezing.   Skin: No-   rash or lesions. GI:  No-   heartburn, indigestion, abdominal pain, nausea, vomiting,  GU:. MS:  No-   joint pain or swelling. . Neuro-     nothing unusual Psych:  No- change in mood or affect. No depression or anxiety.  No memory loss.   Objective:   OBJ- Physical Exam General- Alert, Oriented, Affect-appropriate, Distress- none acute Skin- rash-none, lesions- none, excoriation- none Lymphadenopathy- none Head- atraumatic            Eyes- Gross vision intact,  PERRLA, conjunctivae and secretions clear            Ears- Hearing, canals-normal            Nose- + pale mucosa, no-Septal dev, mucus, polyps, erosion, perforation             Throat- Mallampati II , mucosa clear , drainage- none, tonsils- atrophic Neck- flexible , trachea midline, no stridor , thyroid nl, carotid no bruit Chest - symmetrical excursion , unlabored           Heart/CV- RRR , no murmur , no gallop  , no rub, nl s1 s2                           - JVD- none , edema- none, stasis changes- none, varices- none           Lung- + bibasilar rhonchi, wheeze- none, cough- none , dullness-none, rub- none           Chest wall-  Abd-  Br/ Gen/ Rectal- Not done, not indicated Extrem- cyanosis- none, clubbing, none, atrophy- none, strength- nl Neuro- grossly intact to observation

## 2012-07-03 NOTE — Patient Instructions (Addendum)
Order- limited CT max fac    Dx  Chronic sinusitis  Sample Dymista nasal spray    1-2 puffs each nostril once daily at bedtime  Resume allergy vaccine as you become able to travel

## 2012-07-05 DIAGNOSIS — S329XXA Fracture of unspecified parts of lumbosacral spine and pelvis, initial encounter for closed fracture: Secondary | ICD-10-CM | POA: Diagnosis not present

## 2012-07-09 ENCOUNTER — Ambulatory Visit (INDEPENDENT_AMBULATORY_CARE_PROVIDER_SITE_OTHER): Payer: Medicare Other

## 2012-07-09 ENCOUNTER — Ambulatory Visit (INDEPENDENT_AMBULATORY_CARE_PROVIDER_SITE_OTHER)
Admission: RE | Admit: 2012-07-09 | Discharge: 2012-07-09 | Disposition: A | Discharge status: Home / Self Care | Payer: Medicare Other | Source: Ambulatory Visit | Attending: Internal Medicine | Admitting: Internal Medicine

## 2012-07-09 DIAGNOSIS — J479 Bronchiectasis, uncomplicated: Secondary | ICD-10-CM | POA: Diagnosis not present

## 2012-07-09 DIAGNOSIS — J329 Chronic sinusitis, unspecified: Secondary | ICD-10-CM

## 2012-07-09 DIAGNOSIS — J309 Allergic rhinitis, unspecified: Secondary | ICD-10-CM

## 2012-07-10 ENCOUNTER — Telehealth: Payer: Self-pay | Admitting: Family Medicine

## 2012-07-10 ENCOUNTER — Ambulatory Visit: Payer: Medicare Other

## 2012-07-10 NOTE — Telephone Encounter (Signed)
Per Dr. Soyla Dryer request, patient missed her original appointment with Dr. Charlann Boxer - please call and reschedule.  Called on 06/25/12 and rescheduled patient's appointment for 07/05/12 @ 1:15 with Dr. Charlann Boxer @ [facility].  I left message on machine to notify patient and mailed her a letter with info needed

## 2012-07-10 NOTE — Telephone Encounter (Signed)
Message: Prior message went out concerning Deno Etienne office visit with ortho. Enid Derry has the patient information and is currently assisting the patient at this time. VOID out prior message - thanks in advance, Matthias Hughs        Just a follow up on this patient; thank you for completing the office visit. Thanks in advance, [place]

## 2012-07-16 NOTE — Progress Notes (Signed)
Quick Note:  LMOM TCB x2. ______ 

## 2012-07-22 ENCOUNTER — Telehealth: Payer: Self-pay | Admitting: Internal Medicine

## 2012-07-22 DIAGNOSIS — M810 Age-related osteoporosis without current pathological fracture: Secondary | ICD-10-CM | POA: Diagnosis not present

## 2012-07-22 NOTE — Telephone Encounter (Signed)
Notes Recorded by Waymon Budge, MD on 07/09/2012 at 8:44 PM CT sinuses- sinus cavities are clear. There is deviated septum which will always make nose somewhat stuffy  Spoke with pt and notified of results per Dr. Maple Hudson. Pt verbalized understanding and denied any questions.

## 2012-07-24 ENCOUNTER — Ambulatory Visit (INDEPENDENT_AMBULATORY_CARE_PROVIDER_SITE_OTHER): Payer: Medicare Other | Admitting: Emergency Medicine

## 2012-07-24 ENCOUNTER — Ambulatory Visit (INDEPENDENT_AMBULATORY_CARE_PROVIDER_SITE_OTHER): Payer: Medicare Other

## 2012-07-24 ENCOUNTER — Encounter: Payer: Self-pay | Admitting: Emergency Medicine

## 2012-07-24 VITALS — BP 120/70 | HR 78 | Temp 97.8°F | Ht 64.0 in | Wt 102.2 lb

## 2012-07-24 DIAGNOSIS — J309 Allergic rhinitis, unspecified: Secondary | ICD-10-CM

## 2012-07-24 DIAGNOSIS — J479 Bronchiectasis, uncomplicated: Secondary | ICD-10-CM

## 2012-07-24 MED ORDER — CLARITHROMYCIN 500 MG PO TABS: 500.0000 mg | ORAL_TABLET | Freq: Two times a day (BID) | ORAL | Status: DC

## 2012-07-24 MED ORDER — DOXYCYCLINE HYCLATE 100 MG PO TABS: 100.0000 mg | ORAL_TABLET | Freq: Two times a day (BID) | ORAL | Status: DC

## 2012-07-24 NOTE — Patient Instructions (Addendum)
We will restart alternating antibiotics every month > clarithromycin for 10 days and doxycycline for 10 days.  Restart your allergy shots as planned Continue your nasal saline washes and dymista.  Follow with Dr Delton Coombes in 2 months or sooner if you have any problems. We will plan the timing of a repeat CT scan of the chest at your next visit

## 2012-07-24 NOTE — Assessment & Plan Note (Signed)
We will restart alternating antibiotics every month > clarithromycin for 10 days and doxycycline for 10 days.  Restart your allergy shots as planned Continue your nasal saline washes and dymista.  Follow with Dr Koty Anctil in 2 months or sooner if you have any problems. We will plan the timing of a repeat CT scan of the chest at your next visit  

## 2012-07-24 NOTE — Progress Notes (Signed)
Patient ID: Mary [name], female    DOB: 11/13/1945, 67 y.o.   MRN: [id number] HPI Mary [name] is a 67 year old woman whom I have followed for chronic cough. 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 Evaluated 2011 by Mary [name] for allergies - shows multiple sensitivities that are present year round.   ROV 07/12/09 -- returns for f/u. Tells me that her PND has actually improved since last visit. Her regimen is benadryl  two times a day, NSW two times a day, flutter valve two times a day - sometimes productive but often not. Also on rotating doxy + azithromycin. No flares of bronchiectasis since last visit. She is contemplating starting allergy shots, hopefully can get them from her PCP.   ROV 10/01/09 -- f/u bronchiectasis, cough. Flare that started about a week ago. Began treatment with Avelox and set up this follow up. Was exposed to husband who had a URI. Changed her benadryl to Claritin D. She has not started allergy shots yet. Starting to feel better, not back to baseline. never started Singulair.   ROV 01/24/10 -- Hx chronic cough, bronchiectasis. Tried daliresp, couldn't tolerate due to insomnia. Still with clear drainage, never responded to treatment with nasal steroid. On benadryl qhs. Taking alternating doxy and azithro monthly. Considering starting allergy shots - discussing w Mary [name].   ROV 08/05/10 -- bronchiectasis, chronic cough, severe allergic rhinitis. Doing NSW qd and flutter qd.  She is on allergy shots and nasal gtt may be better. She is alternating doxy with azithro at the beginning of every month. Her cough is predicatble - morning and afternoon. Worst if she has been off abx for an extended period. Last CT scan was 2008.   10/19/10-  67 yo F  Never smoker followed for bronchiectasis/ hx M.avium, chronic cough, severe allergic rhinitis Started allergy vaccine in January, now at 1:50 with occasional minimal  local soreness but no problems. Says the spring pollen season was "rough"- increased nasal stuffiness and cough. Has been better in last 2 months. Still takes benadryl once each night. Hx of trying many nasal sprays. Still some nasal congestion and drip at times. She is interested in getting her shots in [name] at her PCP. allergy profile- IgE 2.4, negative common allergens. Negative Fungal antibody panel  01/20/11-  67 yo F  Never smoker followed for bronchiectasis/ hx M.avium, chronic cough (Mary [name]), severe allergic rhinitis She returns for followup of her allergy vaccine therapy begun in January 2012. She says she had 2 good weeks at the beginning of September but now feels nasal congestion in the morning with watery rhinorrhea later in the day "like a cold". She has not been taking antihistamines very often, but prefers Benadryl. Cough is productive of clear to yellow sputum with no blood or fever. Mary [name] is alternating antibiotics. Flu shot is pending with her primary physician  ROV 05/05/11 -- bronchiectasis, chronic cough, severe allergic rhinitis. Hx MAIC and also MRSA. She has started allergy shots w Mary [name]. She still has congestion and drainage, assoc w dry cough. She is alternating abx (doxy/azithro) beginning every month. The cough and sputum is bad between abx and she often has to start early. Doxy is better than azithro, but it is taking longer for it to be effective. She is wondering if she might be having some reflux.   ROV 06/21/11 -- bronchiectasis w hx MAIC + MRSA, chronic cough both due to bronchiectasis and to  UA irritation, severe allergic rhinitis. Returns after sputum cx >> shows MAIC, sensitivities not yet available. She continues to have cough that is most productive in the am and at the end of the day.   ROV 08/04/11 -- bronchiectasis w hx MAIC + MRSA, chronic cough both due to bronchiectasis and to UA irritation, severe allergic rhinitis. We started clarithro + ethambutol  for her [name], started it on 4/15. She has the bitter taste in her mouth, had this last time. I had thought this was due to rifabutin, but possibly due to clarithro. Not currently on doxy. Since last time she thinks she had URI, now probably back to baseline.   ROV 11/13/11 -- bronchiectasis w hx MAIC + MRSA, chronic cough both due to bronchiectasis and to UA irritation, severe allergic rhinitis. Has been on clarithro/ethambutol since 07/17/11. She has less energy on the abx. She continues to have cough that is productive of either green or clear. The cough is most bothersome at night, can wake her from sleep. She coughs every morning - clears secretions. Then around mid-day she has another bout, then again in the evening.    ROV 01/22/12 -- bronchiectasis w hx MAIC + MRSA, chronic cough both due to bronchiectasis and to UA irritation, severe allergic rhinitis. We treated [name] with erythromycin and ethambutol since April '13. She finished the abx 01/12/12 (6 months). She continues to have cough and UA irritation.  Her nasal gtt is better but not gone; has never benefited from nasal steroid, astelin. Needs repeat CT scan 08/2012.   07/03/12- 67 yo F  Never smoker followed for bronchiectasis/ hx M.avium, chronic cough (Mary [name]), severe allergic rhinitis FOLLOWS FOR: sneezing and runny nose; cough and drainage as well. Still on allergy vaccine Missed her allergy shots 1:10 GH for month of March after fall with cracked pelvis.  Incvreased sneeze, watery rhinorhea, much post-nasal drip. No headache, ears, purulent. Blames PND for increased cough from her bronchiectasis.  Taking a Sudafed-PE product containing an antihistamine, nasal saline irrigation.  ROV 07/24/12 -- bronchiectasis w hx MAIC + MRSA, chronic cough both due to bronchiectasis and to UA irritation, severe allergic rhinitis. She is on immunotherapy per Mary [name], doing NSW's. Had never benefited from nasal steroid or astelin in the past. Started dymista  on 4/2 from Mary [name]. Also had Ct scan sinuses > all clear 07/16/12.  She unfortunately fell and broke her pelvis in early March. Her cough continues, still yellow/green/brown. Occasionally some blood from nose or sputum. Breathing stable. Not currently on rotating abx.   Filed Vitals:   07/24/12 1535  BP: 120/70  Pulse: 78  Temp: 97.8 F (36.6 C)  TempSrc: Oral  Height: 5\' 4"  (1.626 m)  Weight: 102 lb 3.2 oz (46.358 kg)  SpO2: 98%   Gen: Pleasant, well-nourished, in no distress,  normal affect  ENT: No lesions,  mouth clear,  oropharynx clear, no postnasal drip  Neck: No JVD, no TMG, no carotid bruits  Lungs: No use of accessory muscles, no dullness to percussion, clear without rales or rhonchi  Cardiovascular: RRR, heart sounds normal, no murmur or gallops, no peripheral edema  Musculoskeletal: No deformities, no cyanosis or clubbing  Neuro: alert, non focal  Skin: Warm, no lesions or rashes   07/16/12 CT scan sinuses Comparison: 12/24/2006 and earlier.  Findings: Negative visualized noncontrast brain parenchyma, orbit  soft tissues, and face soft tissues.  Visualized mastoids and tympanic cavities are clear.  Hyperplastic sphenoid sinuses remain clear.  Ethmoid air cells remain clear.  Frontal sinuses remain clear.  Maxillary sinuses remain clear. Both OMCs are patent.  Chronic rightward nasal septal deviation. No acute osseous  abnormality identified.  IMPRESSION:  Stable and clear paranasal sinuses.   BRONCHIECTASIS We will restart alternating antibiotics every month > clarithromycin for 10 days and doxycycline for 10 days.  Restart your allergy shots as planned Continue your nasal saline washes and dymista.  Follow with Mary [name] in 2 months or sooner if you have any problems. We will plan the timing of a repeat CT scan of the chest at your next visit

## 2012-07-25 NOTE — Progress Notes (Signed)
Quick Note:  Mary [name], CMA at 07/22/2012 4:09 PM   Status: Signed     Notes Recorded by Waymon Budge, MD on 07/09/2012 at 8:44 PM CT sinuses- sinus cavities are clear. There is deviated septum which will always make nose somewhat stuffy   Spoke with pt and notified of results per Dr. Maple Hudson. Pt verbalized understanding and denied any questions.    ______

## 2012-07-31 ENCOUNTER — Ambulatory Visit (INDEPENDENT_AMBULATORY_CARE_PROVIDER_SITE_OTHER): Payer: Medicare Other

## 2012-07-31 DIAGNOSIS — J309 Allergic rhinitis, unspecified: Secondary | ICD-10-CM

## 2012-08-07 ENCOUNTER — Ambulatory Visit (INDEPENDENT_AMBULATORY_CARE_PROVIDER_SITE_OTHER): Payer: Medicare Other

## 2012-08-07 DIAGNOSIS — J309 Allergic rhinitis, unspecified: Secondary | ICD-10-CM

## 2012-08-14 ENCOUNTER — Ambulatory Visit (INDEPENDENT_AMBULATORY_CARE_PROVIDER_SITE_OTHER): Payer: Medicare Other

## 2012-08-14 DIAGNOSIS — J309 Allergic rhinitis, unspecified: Secondary | ICD-10-CM | POA: Diagnosis not present

## 2012-08-21 ENCOUNTER — Ambulatory Visit (INDEPENDENT_AMBULATORY_CARE_PROVIDER_SITE_OTHER): Payer: Medicare Other

## 2012-08-21 DIAGNOSIS — J309 Allergic rhinitis, unspecified: Secondary | ICD-10-CM

## 2012-08-28 ENCOUNTER — Ambulatory Visit (INDEPENDENT_AMBULATORY_CARE_PROVIDER_SITE_OTHER): Payer: Medicare Other

## 2012-08-28 DIAGNOSIS — J309 Allergic rhinitis, unspecified: Secondary | ICD-10-CM

## 2012-09-04 ENCOUNTER — Ambulatory Visit (INDEPENDENT_AMBULATORY_CARE_PROVIDER_SITE_OTHER): Payer: Medicare Other

## 2012-09-04 DIAGNOSIS — J309 Allergic rhinitis, unspecified: Secondary | ICD-10-CM | POA: Diagnosis not present

## 2012-09-25 ENCOUNTER — Encounter: Payer: Self-pay | Admitting: Emergency Medicine

## 2012-09-25 ENCOUNTER — Ambulatory Visit (INDEPENDENT_AMBULATORY_CARE_PROVIDER_SITE_OTHER): Payer: Medicare Other

## 2012-09-25 ENCOUNTER — Ambulatory Visit (INDEPENDENT_AMBULATORY_CARE_PROVIDER_SITE_OTHER): Payer: Medicare Other | Admitting: Emergency Medicine

## 2012-09-25 VITALS — BP 110/68 | HR 68 | Temp 98.2°F | Ht 64.5 in | Wt 102.2 lb

## 2012-09-25 DIAGNOSIS — J479 Bronchiectasis, uncomplicated: Secondary | ICD-10-CM | POA: Diagnosis not present

## 2012-09-25 DIAGNOSIS — J309 Allergic rhinitis, unspecified: Secondary | ICD-10-CM | POA: Diagnosis not present

## 2012-09-25 NOTE — Patient Instructions (Addendum)
Please continue your current medications as you are taking them We will repeat your CT scan of the chest to compare with 2012.  We will call you to discuss the CT results.  Follow with Dr Delton Coombes in 6 months or sooner if you have any problems

## 2012-09-25 NOTE — Assessment & Plan Note (Signed)
-   continue allergy regimen - continue inhaled meds - repeat CT scan chest to compare w priors, will call her to discuss results - rov 6 mon

## 2012-09-25 NOTE — Progress Notes (Signed)
Patient ID: Mary [name], female    DOB: 11/13/1945, 67 y.o.   MRN: [id number] HPI Mary [name] is a 67 year old woman whom I have followed for chronic cough. 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 Evaluated 2011 by Mary [name] for allergies - shows multiple sensitivities that are present year round.   ROV 07/12/09 -- returns for f/u. Tells me that her PND has actually improved since last visit. Her regimen is benadryl  two times a day, NSW two times a day, flutter valve two times a day - sometimes productive but often not. Also on rotating doxy + azithromycin. No flares of bronchiectasis since last visit. She is contemplating starting allergy shots, hopefully can get them from her PCP.   ROV 10/01/09 -- f/u bronchiectasis, cough. Flare that started about a week ago. Began treatment with Avelox and set up this follow up. Was exposed to husband who had a URI. Changed her benadryl to Claritin D. She has not started allergy shots yet. Starting to feel better, not back to baseline. never started Singulair.   ROV 01/24/10 -- Hx chronic cough, bronchiectasis. Tried daliresp, couldn't tolerate due to insomnia. Still with clear drainage, never responded to treatment with nasal steroid. On benadryl qhs. Taking alternating doxy and azithro monthly. Considering starting allergy shots - discussing w Mary [name].   ROV 08/05/10 -- bronchiectasis, chronic cough, severe allergic rhinitis. Doing NSW qd and flutter qd.  She is on allergy shots and nasal gtt may be better. She is alternating doxy with azithro at the beginning of every month. Her cough is predicatble - morning and afternoon. Worst if she has been off abx for an extended period. Last CT scan was 2008.   10/19/10-  30 yo F  Never smoker followed for bronchiectasis/ hx M.avium, chronic cough, severe allergic rhinitis Started allergy vaccine in January, now at 1:50 with occasional minimal  local soreness but no problems. Says the spring pollen season was "rough"- increased nasal stuffiness and cough. Has been better in last 2 months. Still takes benadryl once each night. Hx of trying many nasal sprays. Still some nasal congestion and drip at times. She is interested in getting her shots in [name] at her PCP. allergy profile- IgE 2.4, negative common allergens. Negative Fungal antibody panel  01/20/11-  38 yo F  Never smoker followed for bronchiectasis/ hx M.avium, chronic cough (Mary [name]), severe allergic rhinitis She returns for followup of her allergy vaccine therapy begun in January 2012. She says she had 2 good weeks at the beginning of September but now feels nasal congestion in the morning with watery rhinorrhea later in the day "like a cold". She has not been taking antihistamines very often, but prefers Benadryl. Cough is productive of clear to yellow sputum with no blood or fever. Mary [name] is alternating antibiotics. Flu shot is pending with her primary physician  ROV 05/05/11 -- bronchiectasis, chronic cough, severe allergic rhinitis. Hx MAIC and also MRSA. She has started allergy shots w Mary [name]. She still has congestion and drainage, assoc w dry cough. She is alternating abx (doxy/azithro) beginning every month. The cough and sputum is bad between abx and she often has to start early. Doxy is better than azithro, but it is taking longer for it to be effective. She is wondering if she might be having some reflux.   ROV 06/21/11 -- bronchiectasis w hx MAIC + MRSA, chronic cough both due to bronchiectasis and to  UA irritation, severe allergic rhinitis. Returns after sputum cx >> shows MAIC, sensitivities not yet available. She continues to have cough that is most productive in the am and at the end of the day.   ROV 08/04/11 -- bronchiectasis w hx MAIC + MRSA, chronic cough both due to bronchiectasis and to UA irritation, severe allergic rhinitis. We started clarithro + ethambutol  for her [facility], started it on 4/15. She has the bitter taste in her mouth, had this last time. I had thought this was due to rifabutin, but possibly due to clarithro. Not currently on doxy. Since last time she thinks she had URI, now probably back to baseline.   ROV 11/13/11 -- bronchiectasis w hx MAIC + MRSA, chronic cough both due to bronchiectasis and to UA irritation, severe allergic rhinitis. Has been on clarithro/ethambutol since 07/17/11. She has less energy on the abx. She continues to have cough that is productive of either green or clear. The cough is most bothersome at night, can wake her from sleep. She coughs every morning - clears secretions. Then around mid-day she has another bout, then again in the evening.    ROV 01/22/12 -- bronchiectasis w hx MAIC + MRSA, chronic cough both due to bronchiectasis and to UA irritation, severe allergic rhinitis. We treated [facility] with erythromycin and ethambutol since April '13. She finished the abx 01/12/12 (6 months). She continues to have cough and UA irritation.  Her nasal gtt is better but not gone; has never benefited from nasal steroid, astelin. Needs repeat CT scan 08/2012.   07/03/12- 32 yo F  Never smoker followed for bronchiectasis/ hx M.avium, chronic cough (Mary [name]), severe allergic rhinitis FOLLOWS FOR: sneezing and runny nose; cough and drainage as well. Still on allergy vaccine Missed her allergy shots 1:10 GH for month of March after fall with cracked pelvis.  Incvreased sneeze, watery rhinorhea, much post-nasal drip. No headache, ears, purulent. Blames PND for increased cough from her bronchiectasis.  Taking a Sudafed-PE product containing an antihistamine, nasal saline irrigation.  ROV 07/24/12 -- bronchiectasis w hx MAIC + MRSA, chronic cough both due to bronchiectasis and to UA irritation, severe allergic rhinitis. She is on immunotherapy per Mary [name], doing NSW's. Had never benefited from nasal steroid or astelin in the past. Started dymista  on 4/2 from Mary [name]. Also had Ct scan sinuses > all clear 07/16/12.  She unfortunately fell and broke her pelvis in early March. Her cough continues, still yellow/green/brown. Occasionally some blood from nose or sputum. Breathing stable. Not currently on rotating abx.   ROV 09/25/12 -- bronchiectasis w hx MAIC + MRSA, chronic cough both due to bronchiectasis and to UA irritation, severe allergic rhinitis. We restarted rotating abx after last visit. She notes that sputum is no longer as colored, still thick. Alternating doxy and clarithro.  She has also been on Keflex long term for recurrent UTI's. Sh eis back on allergy shots.   Filed Vitals:   09/25/12 1359  BP: 110/68  Pulse: 68  Temp: 98.2 F (36.8 C)  TempSrc: Oral  Height: 5' 4.5" (1.638 m)  Weight: 102 lb 3.2 oz (46.358 kg)  SpO2: 95%   Gen: Pleasant, well-nourished, in no distress,  normal affect  ENT: No lesions,  mouth clear,  oropharynx clear, no postnasal drip  Neck: No JVD, no TMG, no carotid bruits  Lungs: No use of accessory muscles, no dullness to percussion, clear without rales or rhonchi  Cardiovascular: RRR, heart sounds normal, no murmur or  gallops, no peripheral edema  Musculoskeletal: No deformities, no cyanosis or clubbing  Neuro: alert, non focal  Skin: Warm, no lesions or rashes   07/16/12 CT scan sinuses Comparison: 12/24/2006 and earlier.  Findings: Negative visualized noncontrast brain parenchyma, orbit  soft tissues, and face soft tissues.  Visualized mastoids and tympanic cavities are clear.  Hyperplastic sphenoid sinuses remain clear.  Ethmoid air cells remain clear.  Frontal sinuses remain clear.  Maxillary sinuses remain clear. Both OMCs are patent.  Chronic rightward nasal septal deviation. No acute osseous  abnormality identified.  IMPRESSION:  Stable and clear paranasal sinuses.   BRONCHIECTASIS - continue allergy regimen - continue inhaled meds - repeat CT scan chest to compare w  priors, will call her to discuss results - rov 6 mon

## 2012-09-27 ENCOUNTER — Ambulatory Visit (INDEPENDENT_AMBULATORY_CARE_PROVIDER_SITE_OTHER)
Admission: RE | Admit: 2012-09-27 | Discharge: 2012-09-27 | Disposition: A | Discharge status: Home / Self Care | Payer: Medicare Other | Source: Ambulatory Visit | Attending: Emergency Medicine | Admitting: Emergency Medicine

## 2012-09-27 DIAGNOSIS — A319 Mycobacterial infection, unspecified: Secondary | ICD-10-CM | POA: Diagnosis not present

## 2012-09-27 DIAGNOSIS — J479 Bronchiectasis, uncomplicated: Secondary | ICD-10-CM

## 2012-09-27 DIAGNOSIS — J471 Bronchiectasis with (acute) exacerbation: Secondary | ICD-10-CM | POA: Diagnosis not present

## 2012-10-02 ENCOUNTER — Ambulatory Visit (INDEPENDENT_AMBULATORY_CARE_PROVIDER_SITE_OTHER): Payer: Medicare Other

## 2012-10-02 DIAGNOSIS — J309 Allergic rhinitis, unspecified: Secondary | ICD-10-CM

## 2012-10-03 NOTE — Progress Notes (Signed)
Quick Note:  Spoke with patient, informed her of results as listed below per Dr. Delton Coombes Patient verbalized understanding and nothing further needed at this time ______

## 2012-10-09 ENCOUNTER — Ambulatory Visit (INDEPENDENT_AMBULATORY_CARE_PROVIDER_SITE_OTHER): Payer: Medicare Other

## 2012-10-09 DIAGNOSIS — J309 Allergic rhinitis, unspecified: Secondary | ICD-10-CM

## 2012-10-16 ENCOUNTER — Ambulatory Visit (INDEPENDENT_AMBULATORY_CARE_PROVIDER_SITE_OTHER): Payer: Medicare Other

## 2012-10-16 DIAGNOSIS — J309 Allergic rhinitis, unspecified: Secondary | ICD-10-CM

## 2012-10-23 ENCOUNTER — Ambulatory Visit (INDEPENDENT_AMBULATORY_CARE_PROVIDER_SITE_OTHER): Payer: Medicare Other

## 2012-10-23 DIAGNOSIS — J309 Allergic rhinitis, unspecified: Secondary | ICD-10-CM

## 2012-10-30 ENCOUNTER — Ambulatory Visit: Payer: Medicare Other

## 2012-11-01 ENCOUNTER — Ambulatory Visit (INDEPENDENT_AMBULATORY_CARE_PROVIDER_SITE_OTHER): Payer: Medicare Other

## 2012-11-01 DIAGNOSIS — J309 Allergic rhinitis, unspecified: Secondary | ICD-10-CM | POA: Diagnosis not present

## 2012-11-06 ENCOUNTER — Other Ambulatory Visit: Payer: Self-pay

## 2012-11-06 ENCOUNTER — Ambulatory Visit (INDEPENDENT_AMBULATORY_CARE_PROVIDER_SITE_OTHER): Payer: Medicare Other

## 2012-11-06 DIAGNOSIS — J309 Allergic rhinitis, unspecified: Secondary | ICD-10-CM

## 2012-11-08 DIAGNOSIS — H2510 Age-related nuclear cataract, unspecified eye: Secondary | ICD-10-CM | POA: Diagnosis not present

## 2012-11-13 ENCOUNTER — Ambulatory Visit (INDEPENDENT_AMBULATORY_CARE_PROVIDER_SITE_OTHER): Payer: Medicare Other

## 2012-11-13 DIAGNOSIS — J309 Allergic rhinitis, unspecified: Secondary | ICD-10-CM | POA: Diagnosis not present

## 2012-11-20 ENCOUNTER — Ambulatory Visit (INDEPENDENT_AMBULATORY_CARE_PROVIDER_SITE_OTHER): Payer: Medicare Other

## 2012-11-20 DIAGNOSIS — J309 Allergic rhinitis, unspecified: Secondary | ICD-10-CM

## 2012-11-27 ENCOUNTER — Ambulatory Visit (INDEPENDENT_AMBULATORY_CARE_PROVIDER_SITE_OTHER): Payer: Medicare Other

## 2012-11-27 DIAGNOSIS — Z1231 Encounter for screening mammogram for malignant neoplasm of breast: Secondary | ICD-10-CM | POA: Diagnosis not present

## 2012-11-27 DIAGNOSIS — J309 Allergic rhinitis, unspecified: Secondary | ICD-10-CM

## 2012-12-04 ENCOUNTER — Ambulatory Visit (INDEPENDENT_AMBULATORY_CARE_PROVIDER_SITE_OTHER): Payer: Medicare Other

## 2012-12-04 DIAGNOSIS — J309 Allergic rhinitis, unspecified: Secondary | ICD-10-CM | POA: Diagnosis not present

## 2012-12-11 ENCOUNTER — Ambulatory Visit (INDEPENDENT_AMBULATORY_CARE_PROVIDER_SITE_OTHER): Payer: Medicare Other

## 2012-12-11 DIAGNOSIS — J309 Allergic rhinitis, unspecified: Secondary | ICD-10-CM | POA: Diagnosis not present

## 2012-12-18 ENCOUNTER — Ambulatory Visit (INDEPENDENT_AMBULATORY_CARE_PROVIDER_SITE_OTHER): Payer: Medicare Other

## 2012-12-18 DIAGNOSIS — J309 Allergic rhinitis, unspecified: Secondary | ICD-10-CM | POA: Diagnosis not present

## 2012-12-25 ENCOUNTER — Ambulatory Visit (INDEPENDENT_AMBULATORY_CARE_PROVIDER_SITE_OTHER): Payer: Medicare Other

## 2012-12-25 DIAGNOSIS — J309 Allergic rhinitis, unspecified: Secondary | ICD-10-CM | POA: Diagnosis not present

## 2013-01-01 ENCOUNTER — Ambulatory Visit (INDEPENDENT_AMBULATORY_CARE_PROVIDER_SITE_OTHER): Payer: Medicare Other | Admitting: Internal Medicine

## 2013-01-01 ENCOUNTER — Ambulatory Visit (INDEPENDENT_AMBULATORY_CARE_PROVIDER_SITE_OTHER): Payer: Medicare Other

## 2013-01-01 ENCOUNTER — Encounter: Payer: Self-pay | Admitting: Internal Medicine

## 2013-01-01 VITALS — BP 110/58 | HR 77 | Ht 64.0 in | Wt 103.2 lb

## 2013-01-01 DIAGNOSIS — J301 Allergic rhinitis due to pollen: Secondary | ICD-10-CM

## 2013-01-01 DIAGNOSIS — J309 Allergic rhinitis, unspecified: Secondary | ICD-10-CM | POA: Diagnosis not present

## 2013-01-01 MED ORDER — AZELASTINE-FLUTICASONE 137-50 MCG/ACT NA SUSP: 1.0000 | Freq: Every day | NASAL | Status: DC

## 2013-01-01 MED ORDER — IPRATROPIUM BROMIDE 0.06 % NA SOLN: NASAL | Status: DC

## 2013-01-01 NOTE — Patient Instructions (Addendum)
We can continue allergy vaccine 1:10 GH  Sample Dymista nasal spray      1-2 puffs each nostril, once daily at bedtime to start. This can be taken twice daily if needed. Needs time to build up.  Script to try ipratropium nasal spray next- directions will be on the bottle

## 2013-01-01 NOTE — Progress Notes (Signed)
Patient ID: LARIYAH SHETTERLY, female    DOB: 11/13/1945, 67 y.o.   MRN: [id number] HPI [name] is a 67 year old woman whom I have followed for chronic cough. 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 Evaluated 2011 by Dr Maple Hudson for allergies - shows multiple sensitivities that are present year round.   ROV 07/12/09 -- returns for f/u. Tells me that her PND has actually improved since last visit. Her regimen is benadryl  two times a day, NSW two times a day, flutter valve two times a day - sometimes productive but often not. Also on rotating doxy + azithromycin. No flares of bronchiectasis since last visit. She is contemplating starting allergy shots, hopefully can get them from her PCP.   ROV 10/01/09 -- f/u bronchiectasis, cough. Flare that started about a week ago. Began treatment with Avelox and set up this follow up. Was exposed to husband who had a URI. Changed her benadryl to Claritin D. She has not started allergy shots yet. Starting to feel better, not back to baseline. never started Singulair.   ROV 01/24/10 -- Hx chronic cough, bronchiectasis. Tried daliresp, couldn't tolerate due to insomnia. Still with clear drainage, never responded to treatment with nasal steroid. On benadryl qhs. Taking alternating doxy and azithro monthly. Considering starting allergy shots - discussing w Dr Maple Hudson.   ROV 08/05/10 -- bronchiectasis, chronic cough, severe allergic rhinitis. Doing NSW qd and flutter qd.  She is on allergy shots and nasal gtt may be better. She is alternating doxy with azithro at the beginning of every month. Her cough is predicatble - morning and afternoon. Worst if she has been off abx for an extended period. Last CT scan was 2008.   10/19/10-  30 yo F  Never smoker followed for bronchiectasis/ hx M.avium, chronic cough, severe allergic rhinitis Started allergy vaccine in January, now at 1:50 with occasional minimal  local soreness but no problems. Says the spring pollen season was "rough"- increased nasal stuffiness and cough. Has been better in last 2 months. Still takes benadryl once each night. Hx of trying many nasal sprays. Still some nasal congestion and drip at times. She is interested in getting her shots in [name] at her PCP. allergy profile- IgE 2.4, negative common allergens. Negative Fungal antibody panel  01/20/11-  38 yo F  Never smoker followed for bronchiectasis/ hx M.avium, chronic cough (Dr Delton Coombes), severe allergic rhinitis She returns for followup of her allergy vaccine therapy begun in January 2012. She says she had 2 good weeks at the beginning of September but now feels nasal congestion in the morning with watery rhinorrhea later in the day "like a cold". She has not been taking antihistamines very often, but prefers Benadryl. Cough is productive of clear to yellow sputum with no blood or fever. Dr Delton Coombes is alternating antibiotics. Flu shot is pending with her primary physician  ROV 05/05/11 -- bronchiectasis, chronic cough, severe allergic rhinitis. Hx MAIC and also MRSA. She has started allergy shots w Dr Maple Hudson. She still has congestion and drainage, assoc w dry cough. She is alternating abx (doxy/azithro) beginning every month. The cough and sputum is bad between abx and she often has to start early. Doxy is better than azithro, but it is taking longer for it to be effective. She is wondering if she might be having some reflux.   ROV 06/21/11 -- bronchiectasis w hx MAIC + MRSA, chronic cough both due to bronchiectasis and to  UA irritation, severe allergic rhinitis. Returns after sputum cx >> shows MAIC, sensitivities not yet available. She continues to have cough that is most productive in the am and at the end of the day.   ROV 08/04/11 -- bronchiectasis w hx MAIC + MRSA, chronic cough both due to bronchiectasis and to UA irritation, severe allergic rhinitis. We started clarithro + ethambutol  for her [facility], started it on 4/15. She has the bitter taste in her mouth, had this last time. I had thought this was due to rifabutin, but possibly due to clarithro. Not currently on doxy. Since last time she thinks she had URI, now probably back to baseline.   ROV 11/13/11 -- bronchiectasis w hx MAIC + MRSA, chronic cough both due to bronchiectasis and to UA irritation, severe allergic rhinitis. Has been on clarithro/ethambutol since 07/17/11. She has less energy on the abx. She continues to have cough that is productive of either green or clear. The cough is most bothersome at night, can wake her from sleep. She coughs every morning - clears secretions. Then around mid-day she has another bout, then again in the evening.    ROV 01/22/12 -- bronchiectasis w hx MAIC + MRSA, chronic cough both due to bronchiectasis and to UA irritation, severe allergic rhinitis. We treated [facility] with erythromycin and ethambutol since April '13. She finished the abx 01/12/12 (6 months). She continues to have cough and UA irritation.  Her nasal gtt is better but not gone; has never benefited from nasal steroid, astelin. Needs repeat CT scan 08/2012.   07/03/12- 65 yo F  Never smoker followed for bronchiectasis/ hx M.avium, chronic cough (Dr Delton Coombes), severe allergic rhinitis FOLLOWS FOR: sneezing and runny nose; cough and drainage as well. Still on allergy vaccine Missed her allergy shots 1:10 GH for month of March after fall with cracked pelvis.  Incvreased sneeze, watery rhinorhea, much post-nasal drip. No headache, ears, purulent. Blames PND for increased cough from her bronchiectasis.  Taking a Sudafed-PE product containing an antihistamine, nasal saline irrigation.  ROV 07/24/12 -- bronchiectasis w hx MAIC + MRSA, chronic cough both due to bronchiectasis and to UA irritation, severe allergic rhinitis. She is on immunotherapy per Dr Maple Hudson, doing NSW's. Had never benefited from nasal steroid or astelin in the past. Started dymista  on 4/2 from dr Maple Hudson. Also had Ct scan sinuses > all clear 07/16/12.  She unfortunately fell and broke her pelvis in early March. Her cough continues, still yellow/green/brown. Occasionally some blood from nose or sputum. Breathing stable. Not currently on rotating abx.   ROV 09/25/12 -- bronchiectasis w hx MAIC + MRSA, chronic cough both due to bronchiectasis and to UA irritation, severe allergic rhinitis. We restarted rotating abx after last visit. She notes that sputum is no longer as colored, still thick. Alternating doxy and clarithro.  She has also been on Keflex long term for recurrent UTI's. Sh eis back on allergy shots.   910/1/14-  71 yo F  Never smoker followed for bronchiectasis/ hx M.avium, chronic cough (Dr Delton Coombes), severe allergic/ vasomotor rhinitis ( Dr Maple Hudson) 07/16/12 CT scan sinuses Comparison: 12/24/2006 and earlier.  Findings: Negative visualized noncontrast brain parenchyma, orbit  soft tissues, and face soft tissues.  Visualized mastoids and tympanic cavities are clear.  Hyperplastic sphenoid sinuses remain clear.  Ethmoid air cells remain clear.  Frontal sinuses remain clear.  Maxillary sinuses remain clear. Both OMCs are patent.  Chronic rightward nasal septal deviation. No acute osseous  abnormality identified.  IMPRESSION:  Stable and clear paranasal sinuses. BRONCHIECTASIS - continue allergy regimen - continue inhaled meds - repeat CT scan chest to compare w priors, will call her to discuss results - rov 6 mon  01/01/13- 60 yo F  Never smoker followed for bronchiectasis/ hx M.avium, chronic cough (Dr Delton Coombes), severe allergic/ vasomotor rhinitis ( Dr Maple Hudson) FOLLOWS FOR:  Symptoms unchanged since last OV.  Still with cough, runny nose and sneezing Get flu vaccine at PCP. Continues Allergy vaccine 1:10 GH Perennial cough sneeze and drainage. Failed steroid nasal spray. Doesn't recall either Dymista or ipratropium.  ROS-see HPI Constitutional:   No-   weight loss,  night sweats, fevers, chills, fatigue, lassitude. HEENT:   No-  headaches, difficulty swallowing, tooth/dental problems, sore throat,       + sneezing, No-itching, ear ache, nasal congestion, +post nasal drip,  CV:  No-   chest pain, orthopnea, PND, swelling in lower extremities, anasarca, dizziness, palpitations Resp: No-   shortness of breath with exertion or at rest.             +productive cough,  + non-productive cough,  No- coughing up of blood.              No-   change in color of mucus.  No- wheezing.   Skin: No-   rash or lesions. GI:  No-   heartburn, indigestion, abdominal pain, nausea, vomiting, GU:  MS:  No-   joint pain or swelling. Neuro-     nothing unusual Psych:  No- change in mood or affect. No depression or anxiety.  No memory loss.  OBJ- Physical Exam General- Alert, Oriented, Affect-appropriate, Distress- none acute Skin- rash-none, lesions- none, excoriation- none Lymphadenopathy- none Head- atraumatic            Eyes- Gross vision intact, PERRLA, conjunctivae and secretions clear            Ears- Hearing, canals-normal            Nose- +pale mucosa with clear watery mucus, + R Septal dev, polyps, erosion, perforation             Throat- Mallampati II , mucosa clear , drainage- none, tonsils- atrophic Neck- flexible , trachea midline, no stridor , thyroid nl, carotid no bruit Chest - symmetrical excursion , unlabored           Heart/CV- RRR , no murmur , no gallop  , no rub, nl s1 s2                           - JVD- none , edema- none, stasis changes- none, varices- none           Lung- clear to P&A, wheeze- none, cough- none , dullness-none, rub- none           Chest wall-  Abd-  Br/ Gen/ Rectal- Not done, not indicated Extrem- cyanosis- none, clubbing, none, atrophy- none, strength- nl Neuro- grossly intact to observation

## 2013-01-06 DIAGNOSIS — Z23 Encounter for immunization: Secondary | ICD-10-CM | POA: Diagnosis not present

## 2013-01-08 ENCOUNTER — Ambulatory Visit (INDEPENDENT_AMBULATORY_CARE_PROVIDER_SITE_OTHER): Payer: Medicare Other

## 2013-01-08 DIAGNOSIS — J309 Allergic rhinitis, unspecified: Secondary | ICD-10-CM | POA: Diagnosis not present

## 2013-01-12 NOTE — Assessment & Plan Note (Signed)
Okay to continue allergy vaccine. She has allergic and nonallergic/vasomotor rhinitis. Plan-sample Dymista and prescription for ipratropium nasal spray, for comparison

## 2013-01-15 ENCOUNTER — Ambulatory Visit: Payer: Medicare Other

## 2013-01-16 ENCOUNTER — Other Ambulatory Visit: Payer: Self-pay | Admitting: Internal Medicine

## 2013-01-16 MED ORDER — FLUTICASONE PROPIONATE 50 MCG/ACT NA SUSP: NASAL | Status: DC

## 2013-01-16 MED ORDER — AZELASTINE HCL 0.1 % NA SOLN: NASAL | Status: DC

## 2013-01-16 MED ORDER — IPRATROPIUM BROMIDE 0.03 % NA SOLN: NASAL | Status: DC

## 2013-01-22 ENCOUNTER — Ambulatory Visit (INDEPENDENT_AMBULATORY_CARE_PROVIDER_SITE_OTHER): Payer: Medicare Other

## 2013-01-22 DIAGNOSIS — J309 Allergic rhinitis, unspecified: Secondary | ICD-10-CM

## 2013-01-29 ENCOUNTER — Ambulatory Visit (INDEPENDENT_AMBULATORY_CARE_PROVIDER_SITE_OTHER): Payer: Medicare Other

## 2013-01-29 DIAGNOSIS — J309 Allergic rhinitis, unspecified: Secondary | ICD-10-CM | POA: Diagnosis not present

## 2013-02-05 ENCOUNTER — Ambulatory Visit: Payer: Medicare Other

## 2013-02-12 ENCOUNTER — Ambulatory Visit (INDEPENDENT_AMBULATORY_CARE_PROVIDER_SITE_OTHER): Payer: Medicare Other

## 2013-02-12 DIAGNOSIS — J309 Allergic rhinitis, unspecified: Secondary | ICD-10-CM

## 2013-02-19 ENCOUNTER — Ambulatory Visit (INDEPENDENT_AMBULATORY_CARE_PROVIDER_SITE_OTHER): Payer: Medicare Other

## 2013-02-19 DIAGNOSIS — J309 Allergic rhinitis, unspecified: Secondary | ICD-10-CM | POA: Diagnosis not present

## 2013-02-26 ENCOUNTER — Ambulatory Visit (INDEPENDENT_AMBULATORY_CARE_PROVIDER_SITE_OTHER): Payer: Medicare Other

## 2013-02-26 DIAGNOSIS — J309 Allergic rhinitis, unspecified: Secondary | ICD-10-CM

## 2013-02-28 ENCOUNTER — Ambulatory Visit (INDEPENDENT_AMBULATORY_CARE_PROVIDER_SITE_OTHER): Payer: Medicare Other

## 2013-02-28 DIAGNOSIS — J309 Allergic rhinitis, unspecified: Secondary | ICD-10-CM

## 2013-03-05 ENCOUNTER — Ambulatory Visit: Payer: Medicare Other

## 2013-03-10 ENCOUNTER — Other Ambulatory Visit: Payer: Self-pay | Admitting: Family Medicine

## 2013-03-12 ENCOUNTER — Ambulatory Visit (INDEPENDENT_AMBULATORY_CARE_PROVIDER_SITE_OTHER): Payer: Medicare Other

## 2013-03-12 DIAGNOSIS — J309 Allergic rhinitis, unspecified: Secondary | ICD-10-CM

## 2013-03-19 ENCOUNTER — Ambulatory Visit: Payer: Medicare Other | Admitting: Emergency Medicine

## 2013-03-20 ENCOUNTER — Ambulatory Visit (INDEPENDENT_AMBULATORY_CARE_PROVIDER_SITE_OTHER): Payer: Medicare Other

## 2013-03-20 DIAGNOSIS — J309 Allergic rhinitis, unspecified: Secondary | ICD-10-CM | POA: Diagnosis not present

## 2013-03-24 ENCOUNTER — Ambulatory Visit (INDEPENDENT_AMBULATORY_CARE_PROVIDER_SITE_OTHER): Payer: Medicare Other

## 2013-03-24 DIAGNOSIS — J309 Allergic rhinitis, unspecified: Secondary | ICD-10-CM | POA: Diagnosis not present

## 2013-04-09 ENCOUNTER — Ambulatory Visit (INDEPENDENT_AMBULATORY_CARE_PROVIDER_SITE_OTHER): Payer: Medicare Other

## 2013-04-09 ENCOUNTER — Encounter: Payer: Self-pay | Admitting: Emergency Medicine

## 2013-04-09 ENCOUNTER — Ambulatory Visit (INDEPENDENT_AMBULATORY_CARE_PROVIDER_SITE_OTHER): Payer: Medicare Other | Admitting: Emergency Medicine

## 2013-04-09 VITALS — BP 120/68 | HR 90 | Ht 64.0 in | Wt 102.0 lb

## 2013-04-09 DIAGNOSIS — R05 Cough: Secondary | ICD-10-CM

## 2013-04-09 DIAGNOSIS — R1314 Dysphagia, pharyngoesophageal phase: Secondary | ICD-10-CM | POA: Insufficient documentation

## 2013-04-09 DIAGNOSIS — J309 Allergic rhinitis, unspecified: Secondary | ICD-10-CM

## 2013-04-09 DIAGNOSIS — R059 Cough, unspecified: Secondary | ICD-10-CM

## 2013-04-09 MED ORDER — ALBUTEROL SULFATE HFA 108 (90 BASE) MCG/ACT IN AERS: 2.0000 | INHALATION_SPRAY | RESPIRATORY_TRACT | Status: DC | PRN

## 2013-04-09 MED ORDER — LEVOFLOXACIN 500 MG PO TABS: 500.0000 mg | ORAL_TABLET | Freq: Every day | ORAL | Status: DC

## 2013-04-09 NOTE — Assessment & Plan Note (Signed)
-   will start eval with modified barium swallow

## 2013-04-09 NOTE — Patient Instructions (Signed)
Please stop doxycycline and clarithromycin Take levaquin once a day for 7 days We will try albuterol 2 puffs as needed  We will perform a swallowing evaluation We will discuss starting prednisone to see if it helps your cough Follow with Dr Delton CoombesByrum in 1 month

## 2013-04-09 NOTE — Assessment & Plan Note (Addendum)
Bronchiectasis, PND, possible MAIC contributing. Consider also dysphagia.  - stop doxy and clarithro - levaquin x 7 days - swallowing eval  - considering prednisone challenge for a few months to see if this helps.  - continue allergy shots.  - trial albuterol to see if beneficial.  - Follow with Dr Delton CoombesByrum in 1 month

## 2013-04-09 NOTE — Progress Notes (Signed)
Patient ID: ALECE KOPPEL, female    DOB: 1946/03/29, 68 y.o.   MRN: [id number] HPI Ms. Broz is a 68 year old woman whom I have followed for chronic cough. 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 Evaluated 2011 by Dr Maple Hudson for allergies - shows multiple sensitivities that are present year round.   ROV 07/12/09 -- returns for f/u. Tells me that her PND has actually improved since last visit. Her regimen is benadryl 25mg  two times a day, NSW two times a day, flutter valve two times a day - sometimes productive but often not. Also on rotating doxy + azithromycin. No flares of bronchiectasis since last visit. She is contemplating starting allergy shots, hopefully can get them from her PCP.   ROV 10/01/09 -- f/u bronchiectasis, cough. Flare that started about a week ago. Began treatment with Avelox and set up this follow up. Was exposed to husband who had a URI. Changed her benadryl to Claritin D. She has not started allergy shots yet. Starting to feel better, not back to baseline. never started Singulair.   ROV 01/24/10 -- Hx chronic cough, bronchiectasis. Tried daliresp, couldn't tolerate due to insomnia. Still with clear drainage, never responded to treatment with nasal steroid. On benadryl qhs. Taking alternating doxy and azithro monthly. Considering starting allergy shots - discussing w Dr Maple Hudson.   ROV 08/05/10 -- bronchiectasis, chronic cough, severe allergic rhinitis. Doing NSW qd and flutter qd.  She is on allergy shots and nasal gtt may be better. She is alternating doxy with azithro at the beginning of every month. Her cough is predicatble - morning and afternoon. Worst if she has been off abx for an extended period. Last CT scan was 2008.   10/19/10-  73 yo F  Never smoker followed for bronchiectasis/ hx M.avium, chronic cough, severe allergic rhinitis Started allergy vaccine in January, now at 1:50 with occasional minimal  local soreness but no problems. Says the spring pollen season was "rough"- increased nasal stuffiness and cough. Has been better in last 2 months. Still takes benadryl once each night. Hx of trying many nasal sprays. Still some nasal congestion and drip at times. She is interested in getting her shots in [place] at her PCP. allergy profile- IgE 2.4, negative common allergens. Negative Fungal antibody panel  01/20/11-  39 yo F  Never smoker followed for bronchiectasis/ hx M.avium, chronic cough (Dr Delton Coombes), severe allergic rhinitis She returns for followup of her allergy vaccine therapy begun in January 2012. She says she had 2 good weeks at the beginning of September but now feels nasal congestion in the morning with watery rhinorrhea later in the day "like a cold". She has not been taking antihistamines very often, but prefers Benadryl. Cough is productive of clear to yellow sputum with no blood or fever. Dr Delton Coombes is alternating antibiotics. Flu shot is pending with her primary physician  ROV 05/05/11 -- bronchiectasis, chronic cough, severe allergic rhinitis. Hx MAIC and also MRSA. She has started allergy shots w Dr Maple Hudson. She still has congestion and drainage, assoc w dry cough. She is alternating abx (doxy/azithro) beginning every month. The cough and sputum is bad between abx and she often has to start early. Doxy is better than azithro, but it is taking longer for it to be effective. She is wondering if she might be having some reflux.   ROV 06/21/11 -- bronchiectasis w hx MAIC + MRSA, chronic cough both due to bronchiectasis and to  UA irritation, severe allergic rhinitis. Returns after sputum cx >> shows MAIC, sensitivities not yet available. She continues to have cough that is most productive in the am and at the end of the day.   ROV 08/04/11 -- bronchiectasis w hx MAIC + MRSA, chronic cough both due to bronchiectasis and to UA irritation, severe allergic rhinitis. We started clarithro + ethambutol  for her [facility], started it on 4/15. She has the bitter taste in her mouth, had this last time. I had thought this was due to rifabutin, but possibly due to clarithro. Not currently on doxy. Since last time she thinks she had URI, now probably back to baseline.   ROV 11/13/11 -- bronchiectasis w hx MAIC + MRSA, chronic cough both due to bronchiectasis and to UA irritation, severe allergic rhinitis. Has been on clarithro/ethambutol since 07/17/11. She has less energy on the abx. She continues to have cough that is productive of either green or clear. The cough is most bothersome at night, can wake her from sleep. She coughs every morning - clears secretions. Then around mid-day she has another bout, then again in the evening.    ROV 01/22/12 -- bronchiectasis w hx MAIC + MRSA, chronic cough both due to bronchiectasis and to UA irritation, severe allergic rhinitis. We treated [facility] with erythromycin and ethambutol since April '13. She finished the abx 01/12/12 (6 months). She continues to have cough and UA irritation.  Her nasal gtt is better but not gone; has never benefited from nasal steroid, astelin. Needs repeat CT scan 08/2012.   07/03/12- 68 yo F  Never smoker followed for bronchiectasis/ hx M.avium, chronic cough (Dr Delton Coombes), severe allergic rhinitis FOLLOWS FOR: sneezing and runny nose; cough and drainage as well. Still on allergy vaccine Missed her allergy shots 1:10 GH for month of March after fall with cracked pelvis.  Incvreased sneeze, watery rhinorhea, much post-nasal drip. No headache, ears, purulent. Blames PND for increased cough from her bronchiectasis.  Taking a Sudafed-PE product containing an antihistamine, nasal saline irrigation.  ROV 07/24/12 -- bronchiectasis w hx MAIC + MRSA, chronic cough both due to bronchiectasis and to UA irritation, severe allergic rhinitis. She is on immunotherapy per Dr Maple Hudson, doing NSW's. Had never benefited from nasal steroid or astelin in the past. Started dymista  on 4/2 from dr Maple Hudson. Also had Ct scan sinuses > all clear 07/16/12.  She unfortunately fell and broke her pelvis in early March. Her cough continues, still yellow/green/brown. Occasionally some blood from nose or sputum. Breathing stable. Not currently on rotating abx.   ROV 09/25/12 -- bronchiectasis w hx MAIC + MRSA, chronic cough both due to bronchiectasis and to UA irritation, severe allergic rhinitis. We restarted rotating abx after last visit. She notes that sputum is no longer as colored, still thick. Alternating doxy and clarithro.  She has also been on Keflex long term for recurrent UTI's. She is back on allergy shots.   ROV 04/09/13 -- bronchiectasis w hx MAIC + MRSA, chronic cough both due to bronchiectasis and to UA irritation, severe allergic rhinitis. She has followed with Dr Maple Hudson and receiving allergy shots. She has persistent sx, but recently also had a URI which made everything even worse. She has noticed that she got a skin rash associated with her doxycycline. She is having some problems swallowing - things getting hung up in he throat, difficulty taking clarithro.   Filed Vitals:   04/09/13 1507  BP: 120/68  Pulse: 90  Height: 5\' 4"  (1.626  m)  Weight: 102 lb (46.267 kg)  SpO2: 93%   Gen: Pleasant, well-nourished, in no distress,  normal affect  ENT: No lesions,  mouth clear,  oropharynx clear, no postnasal drip  Neck: No JVD, no TMG, no carotid bruits  Lungs: No use of accessory muscles, no dullness to percussion, clear without rales or rhonchi  Cardiovascular: RRR, heart sounds normal, no murmur or gallops, no peripheral edema  Musculoskeletal: No deformities, no cyanosis or clubbing  Neuro: alert, non focal  Skin: Warm, no lesions or rashes   07/16/12 CT scan sinuses Comparison: 12/24/2006 and earlier.  Findings: Negative visualized noncontrast brain parenchyma, orbit  soft tissues, and face soft tissues.  Visualized mastoids and tympanic cavities are clear.   Hyperplastic sphenoid sinuses remain clear.  Ethmoid air cells remain clear.  Frontal sinuses remain clear.  Maxillary sinuses remain clear. Both OMCs are patent.  Chronic rightward nasal septal deviation. No acute osseous  abnormality identified.  IMPRESSION:  Stable and clear paranasal sinuses.  CT chest 10/03/12 --  Comparison: Chest CTs dated 08/02/2006 and 08/10/2010.  Findings: The mediastinum has a stable appearance with mild  prominence of the ascending aorta, measuring 3.2 cm transverse.  Small mediastinal lymph nodes are unchanged. There are no  pathologically enlarged mediastinal, hilar or axillary lymph nodes.  There is no pleural or pericardial effusion.  The lungs have a fairly stable appearance. There is diffuse  bronchiectasis which is most advanced within the middle lobe,  lingula and both lower lobes. There is associated bronchial wall  thickening and scattered peribronchial ground-glass nodularity.  The nodularity is fluctuating, although there is no dominant mass  or confluent airspace opacity. Expiration imaging demonstrates no  significant air trapping.  The visualized upper abdomen has a stable appearance. There are no  worrisome osseous findings.  IMPRESSION:  Little change in appearance of the lungs with diffuse  bronchiectasis and peribronchial nodularity consistent with chronic  infection. Again this pattern can be seen with atypical  mycobacterial infection. No dominant mass or focal airspace  disease demonstrated    COUGH, CHRONIC Bronchiectasis, PND, possible MAIC contributing. Consider also dysphagia.  - stop doxy and clarithro - levaquin x 7 days - swallowing eval  - considering prednisone challenge for a few months to see if this helps.  - continue allergy shots.  - trial albuterol to see if beneficial.  - Follow with Dr Delton CoombesByrum in 1 month

## 2013-04-10 ENCOUNTER — Other Ambulatory Visit (HOSPITAL_COMMUNITY): Payer: Self-pay | Admitting: Emergency Medicine

## 2013-04-10 DIAGNOSIS — R1310 Dysphagia, unspecified: Secondary | ICD-10-CM

## 2013-04-14 ENCOUNTER — Ambulatory Visit (HOSPITAL_COMMUNITY)
Admission: RE | Admit: 2013-04-14 | Discharge: 2013-04-14 | Disposition: A | Discharge status: Home / Self Care | Payer: Medicare Other | Source: Ambulatory Visit | Attending: Emergency Medicine | Admitting: Emergency Medicine

## 2013-04-14 ENCOUNTER — Other Ambulatory Visit: Payer: Self-pay | Admitting: Emergency Medicine

## 2013-04-14 DIAGNOSIS — J309 Allergic rhinitis, unspecified: Secondary | ICD-10-CM | POA: Insufficient documentation

## 2013-04-14 DIAGNOSIS — R1310 Dysphagia, unspecified: Secondary | ICD-10-CM

## 2013-04-14 DIAGNOSIS — K225 Diverticulum of esophagus, acquired: Secondary | ICD-10-CM | POA: Diagnosis not present

## 2013-04-14 DIAGNOSIS — R1314 Dysphagia, pharyngoesophageal phase: Secondary | ICD-10-CM

## 2013-04-14 DIAGNOSIS — R05 Cough: Secondary | ICD-10-CM | POA: Insufficient documentation

## 2013-04-14 DIAGNOSIS — J479 Bronchiectasis, uncomplicated: Secondary | ICD-10-CM | POA: Diagnosis not present

## 2013-04-14 DIAGNOSIS — R1319 Other dysphagia: Secondary | ICD-10-CM | POA: Insufficient documentation

## 2013-04-14 DIAGNOSIS — Z22322 Carrier or suspected carrier of Methicillin resistant Staphylococcus aureus: Secondary | ICD-10-CM | POA: Diagnosis not present

## 2013-04-14 DIAGNOSIS — R059 Cough, unspecified: Secondary | ICD-10-CM | POA: Insufficient documentation

## 2013-04-14 DIAGNOSIS — F458 Other somatoform disorders: Secondary | ICD-10-CM | POA: Insufficient documentation

## 2013-04-14 DIAGNOSIS — Z8701 Personal history of pneumonia (recurrent): Secondary | ICD-10-CM | POA: Insufficient documentation

## 2013-04-14 NOTE — Procedures (Signed)
Objective Swallowing Evaluation: Modified Barium Swallowing Study  Patient Details  Name: Mary [name] MRN: [id number] Date of Birth: 1945-06-09  Today's Date: 04/14/2013 Time: 4098-1191 SLP Time Calculation (min): 30 min  Past Medical History:  Past Medical History  Diagnosis Date  . Osteoporosis   . Allergic rhinitis   . Mycobacterium avium complex   . Bronchiectasis   . Chronic cough    Past Surgical History:  Past Surgical History  Procedure Laterality Date  . Neck surgery    . Breast biopsy      left   HPI:  Mary [name] is a 68 year old woman, followed by Mary [name] for chronic cough. Diagnosed with severe bronchiectasis and severe vasomotor rhinitis. 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 Evaluated 2011 by Mary [name] for allergies - shows multiple sensitivities that are present year round. Patient with c/o globus with liquids and solids. Remote h/o PNA.      Assessment / Plan / Recommendation Clinical Impression  Dysphagia Diagnosis: Suspected primary esophageal dysphagia Clinical impression: Patient presents with a suspected primary esophageal dysphagia characterized by the appearance of pocketing (? Zenkers diverticulum however MD not present to confirm) of both liquid and solid pos in esophagus proximal to C6-7 during the swallow.  Cueing for a left head turn mild-moderately successful at dumping some of the pocketed residue into the esophagus. Occassional tightening of the UES and backflow of bolus noted up from esophagus resulting in trace penetration with ultimately clears with subsequent swallows/throat clear given intact sensation of episodes. Recommend continuation of current diet with compensatory strategies to aid in esophageal clearance and decrease risk of aspiration. F/u esophageal w/u recommended to formally diagnosis above deficit and determine if intervention possible. Education complete with patient and spouse.     Treatment  Recommendation  No treatment recommended at this time    Diet Recommendation Regular;Thin liquid   Liquid Administration via: Cup;Straw Medication Administration: Whole meds with liquid (may wish to crush large pills) Supervision: Patient able to self feed Compensations: Slow rate;Small sips/bites;Clear throat intermittently;Multiple dry swallows after each bite/sip;Follow solids with liquid Postural Changes and/or Swallow Maneuvers: Seated upright 90 degrees;Upright 30-60 min after meal;Head turn left during swallow    Other  Recommendations Recommended Consults: Consider esophageal assessment Oral Care Recommendations: Oral care BID   Follow Up Recommendations  None            General HPI: Mary [name] is a 68 year old woman, followed by Mary [name] for chronic cough. Diagnosed with severe bronchiectasis and severe vasomotor rhinitis. 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 Evaluated 2011 by Mary [name] for allergies - shows multiple sensitivities that are present year round. Patient with c/o globus with liquids and solids. Remote h/o PNA.  Type of Study: Modified Barium Swallowing Study Reason for Referral: Objectively evaluate swallowing function Previous Swallow Assessment: none Diet Prior to this Study: Regular;Thin liquids Temperature Spikes Noted: No Respiratory Status: Room air History of Recent Intubation: No Behavior/Cognition: Alert;Cooperative;Pleasant mood Oral Cavity - Dentition: Adequate natural dentition Oral Motor / Sensory Function: Within functional limits Self-Feeding Abilities: Able to feed self Patient Positioning: Upright in chair Baseline Vocal Quality: Clear Volitional Cough: Strong Volitional Swallow: Able to elicit Anatomy: Other (Comment) (cervical fusion C5-7) Pharyngeal Secretions: Not observed secondary MBS    Reason for Referral Objectively evaluate swallowing function   Oral Phase Oral Preparation/Oral Phase Oral Phase: WFL    Pharyngeal Phase Pharyngeal Phase Pharyngeal Phase: Impaired Pharyngeal - Thin Pharyngeal - Thin Cup: Penetration/Aspiration  before swallow;Pharyngeal residue - valleculae (trace penetrates cleared with subsequent swallows) Penetration/Aspiration details (thin cup): Material enters airway, CONTACTS cords then ejected out Pharyngeal - Thin Straw: Within functional limits Pharyngeal - Solids Pharyngeal - Puree: Within functional limits Pharyngeal - Regular: Within functional limits  Cervical Esophageal Phase    GO    Cervical Esophageal Phase Cervical Esophageal Phase: Impaired Cervical Esophageal Phase - Thin Thin Cup: Esophageal backflow into cervical esophagus;Esophageal backflow into the pharynx Thin Straw: Esophageal backflow into cervical esophagus;Esophageal backflow into the pharynx Cervical Esophageal Phase - Solids Puree: Esophageal backflow into cervical esophagus;Esophageal backflow into the pharynx Regular: Esophageal backflow into cervical esophagus;Esophageal backflow into the pharynx Cervical Esophageal Phase - Comment Cervical Esophageal Comment: ? Zenkers diverticulum, MD not present to confirm    Functional Assessment Tool Used: skilled clinical judgement Functional Limitations: Swallowing Swallow Current Status ([id number]([id number]): At least 20 percent but less than 40 percent impaired, limited or restricted Swallow Goal Status [phone]): At least 20 percent but less than 40 percent impaired, limited or restricted Swallow Discharge Status [phone](G8998): At least 20 percent but less than 40 percent impaired, limited or restricted   [facility] Mary Coor MA, CCC-SLP [phone]  Mary LangoMcCoy Mary [name] 04/14/2013, 1:32 PM

## 2013-04-16 ENCOUNTER — Ambulatory Visit (INDEPENDENT_AMBULATORY_CARE_PROVIDER_SITE_OTHER): Payer: Medicare Other

## 2013-04-16 DIAGNOSIS — J309 Allergic rhinitis, unspecified: Secondary | ICD-10-CM

## 2013-04-23 ENCOUNTER — Ambulatory Visit (INDEPENDENT_AMBULATORY_CARE_PROVIDER_SITE_OTHER): Payer: Medicare Other

## 2013-04-23 DIAGNOSIS — J309 Allergic rhinitis, unspecified: Secondary | ICD-10-CM

## 2013-04-30 ENCOUNTER — Ambulatory Visit: Payer: Medicare Other

## 2013-04-30 ENCOUNTER — Ambulatory Visit (INDEPENDENT_AMBULATORY_CARE_PROVIDER_SITE_OTHER): Payer: Medicare Other

## 2013-04-30 DIAGNOSIS — J309 Allergic rhinitis, unspecified: Secondary | ICD-10-CM | POA: Diagnosis not present

## 2013-05-07 ENCOUNTER — Ambulatory Visit (INDEPENDENT_AMBULATORY_CARE_PROVIDER_SITE_OTHER): Payer: Medicare Other

## 2013-05-07 ENCOUNTER — Encounter: Payer: Self-pay | Admitting: Internal Medicine

## 2013-05-07 DIAGNOSIS — J309 Allergic rhinitis, unspecified: Secondary | ICD-10-CM | POA: Diagnosis not present

## 2013-05-14 ENCOUNTER — Ambulatory Visit: Payer: Medicare Other | Admitting: Emergency Medicine

## 2013-05-14 ENCOUNTER — Ambulatory Visit (INDEPENDENT_AMBULATORY_CARE_PROVIDER_SITE_OTHER): Payer: Medicare Other

## 2013-05-14 DIAGNOSIS — J309 Allergic rhinitis, unspecified: Secondary | ICD-10-CM | POA: Diagnosis not present

## 2013-05-21 ENCOUNTER — Ambulatory Visit (INDEPENDENT_AMBULATORY_CARE_PROVIDER_SITE_OTHER): Payer: Medicare Other

## 2013-05-21 ENCOUNTER — Encounter: Payer: Self-pay | Admitting: Gastroenterology

## 2013-05-21 ENCOUNTER — Encounter: Payer: Self-pay | Admitting: Emergency Medicine

## 2013-05-21 ENCOUNTER — Ambulatory Visit (INDEPENDENT_AMBULATORY_CARE_PROVIDER_SITE_OTHER): Payer: Medicare Other | Admitting: Emergency Medicine

## 2013-05-21 VITALS — BP 140/78 | HR 93 | Ht 64.5 in | Wt 102.6 lb

## 2013-05-21 DIAGNOSIS — R05 Cough: Secondary | ICD-10-CM | POA: Diagnosis not present

## 2013-05-21 DIAGNOSIS — J479 Bronchiectasis, uncomplicated: Secondary | ICD-10-CM

## 2013-05-21 DIAGNOSIS — J309 Allergic rhinitis, unspecified: Secondary | ICD-10-CM

## 2013-05-21 DIAGNOSIS — R059 Cough, unspecified: Secondary | ICD-10-CM

## 2013-05-21 NOTE — Assessment & Plan Note (Signed)
The contributions here are very difficult to tease out. Over the last year she has had a more dry persistent cough in addition to her typical mucous producing bronchiectasis. 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 I would like her to see GI, talk about the options to deal with this.

## 2013-05-21 NOTE — Progress Notes (Signed)
Patient ID: Mary [name], female    DOB: 1945-08-15, 68 y.o.   MRN: [id number] HPI Mary [name] is a 68 year old woman whom I have followed for chronic cough. 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 Evaluated 2011 by Mary [name] for allergies - shows multiple sensitivities that are present year round.   ROV 07/12/09 -- returns for f/u. Tells me that her PND has actually improved since last visit. Her regimen is benadryl 25mg  two times a day, NSW two times a day, flutter valve two times a day - sometimes productive but often not. Also on rotating doxy + azithromycin. No flares of bronchiectasis since last visit. She is contemplating starting allergy shots, hopefully can get them from her PCP.   ROV 10/01/09 -- f/u bronchiectasis, cough. Flare that started about a week ago. Began treatment with Avelox and set up this follow up. Was exposed to husband who had a URI. Changed her benadryl to Claritin D. She has not started allergy shots yet. Starting to feel better, not back to baseline. never started Singulair.   ROV 01/24/10 -- Hx chronic cough, bronchiectasis. Tried daliresp, couldn't tolerate due to insomnia. Still with clear drainage, never responded to treatment with nasal steroid. On benadryl qhs. Taking alternating doxy and azithro monthly. Considering starting allergy shots - discussing w Mary [name].   ROV 08/05/10 -- bronchiectasis, chronic cough, severe allergic rhinitis. Doing NSW qd and flutter qd.  She is on allergy shots and nasal gtt may be better. She is alternating doxy with azithro at the beginning of every month. Her cough is predicatble - morning and afternoon. Worst if she has been off abx for an extended period. Last CT scan was 2008.   10/19/10-  25 yo F  Never smoker followed for bronchiectasis/ hx M.avium, chronic cough, severe allergic rhinitis Started allergy vaccine in January, now at 1:50 with occasional minimal  local soreness but no problems. Says the spring pollen season was "rough"- increased nasal stuffiness and cough. Has been better in last 2 months. Still takes benadryl once each night. Hx of trying many nasal sprays. Still some nasal congestion and drip at times. She is interested in getting her shots in [place] at her PCP. allergy profile- IgE 2.4, negative common allergens. Negative Fungal antibody panel  01/20/11-  59 yo F  Never smoker followed for bronchiectasis/ hx M.avium, chronic cough (Mary [name]), severe allergic rhinitis She returns for followup of her allergy vaccine therapy begun in January 2012. She says she had 2 good weeks at the beginning of September but now feels nasal congestion in the morning with watery rhinorrhea later in the day "like a cold". She has not been taking antihistamines very often, but prefers Benadryl. Cough is productive of clear to yellow sputum with no blood or fever. Mary [name] is alternating antibiotics. Flu shot is pending with her primary physician  ROV 05/05/11 -- bronchiectasis, chronic cough, severe allergic rhinitis. Hx MAIC and also MRSA. She has started allergy shots w Mary [name]. She still has congestion and drainage, assoc w dry cough. She is alternating abx (doxy/azithro) beginning every month. The cough and sputum is bad between abx and she often has to start early. Doxy is better than azithro, but it is taking longer for it to be effective. She is wondering if she might be having some reflux.   ROV 06/21/11 -- bronchiectasis w hx MAIC + MRSA, chronic cough both due to bronchiectasis and to  UA irritation, severe allergic rhinitis. Returns after sputum cx >> shows MAIC, sensitivities not yet available. She continues to have cough that is most productive in the am and at the end of the day.   ROV 08/04/11 -- bronchiectasis w hx MAIC + MRSA, chronic cough both due to bronchiectasis and to UA irritation, severe allergic rhinitis. We started clarithro + [facility]ambutol  [facility], started it on 4/15. She has the bitter taste in her mouth, had this last time. I had thought this was due to rifabutin, but possibly due to clarithro. Not currently on doxy. Since last time she thinks she had URI, now probably back to baseline.   ROV 11/13/11 -- bronchiectasis w hx MAIC + MRSA, chronic cough both due to bronchiectasis and to UA irritation, severe allergic rhinitis. Has been on clarithro/ethambutol since 07/17/11. She has less energy on the abx. She continues to have cough that is productive of either green or clear. The cough is most bothersome at night, can wake her from sleep. She coughs every morning - clears secretions. Then around mid-day she has another bout, then again in the evening.    ROV 01/22/12 -- bronchiectasis w hx MAIC + MRSA, chronic cough both due to bronchiectasis and to UA irritation, severe allergic rhinitis. [facility] April '[facility]. She finished the abx 01/12/12 (6 months). She continues to have cough and UA irritation.  Her nasal gtt is better but not gone; has never benefited from nasal steroid, astelin. Needs repeat CT scan 08/2012.   07/03/12- 68 yo F  Never smoker followed for bronchiectasis/ hx M.avium, chronic cough (Mary [name]), severe allergic rhinitis FOLLOWS FOR: sneezing and runny nose; cough and drainage as well. Still on allergy vaccine Missed her allergy shots 1:10 GH for month of March after fall with cracked pelvis.  Incvreased sneeze, watery rhinorhea, much post-nasal drip. No headache, ears, purulent. Blames PND for increased cough from her bronchiectasis.  Taking a Sudafed-PE product containing an antihistamine, nasal saline irrigation.  ROV 07/24/12 -- bronchiectasis w hx MAIC + MRSA, chronic cough both due to bronchiectasis and to UA irritation, severe allergic rhinitis. She is on immunotherapy per Mary [name], doing NSW's. Had never benefited from nasal steroid or astelin in the past. Started dymista  on 4/2 from Mary [name]. Also had Ct scan sinuses > all clear 07/16/12.  She unfortunately fell and broke her pelvis in early March. Her cough continues, still yellow/green/brown. Occasionally some blood from nose or sputum. Breathing stable. Not currently on rotating abx.   ROV 09/25/12 -- bronchiectasis w hx MAIC + MRSA, chronic cough both due to bronchiectasis and to UA irritation, severe allergic rhinitis. We restarted rotating abx after last visit. She notes that sputum is no longer as colored, still thick. Alternating doxy and clarithro.  She has also been on Keflex long term for recurrent UTI's. She is back on allergy shots.   ROV 04/09/13 -- bronchiectasis w hx MAIC + MRSA, chronic cough both due to bronchiectasis and to UA irritation, severe allergic rhinitis. She has followed with Mary [name] and receiving allergy shots. She has persistent sx, but recently also had a URI which made everything even worse. She has noticed that she got a skin rash associated with her doxycycline. She is having some problems swallowing - things getting hung up in he throat, difficulty taking clarithro.   ROV 05/21/13 -- bronchiectasis w hx MAIC + MRSA, chronic cough both due to bronchiectasis and to  UA irritation, severe allergic rhinitis. Last time treated with levaquin, stopped rotating abx. She has continued persistent cough, every day all day. Often not productive. She has seen some blood in sputum for last 2 weeks. Her barium study showed a zenker's diverticulum, small HH, some dilation. She has at times coughed enough to bring up small food particles, but she distinguishes this from true emesis. She tried albuterol some - mixed results.    Filed Vitals:   05/21/13 1443  BP: 140/78  Pulse: 93  Height: 5' 4.5" (1.638 m)  Weight: 102 lb 9.6 oz (46.539 kg)  SpO2: 94%   Gen: Pleasant, well-nourished, in no distress,  normal affect  ENT: No lesions,  mouth clear,  oropharynx clear, no postnasal drip  Neck: No JVD, no  TMG, no carotid bruits  Lungs: No use of accessory muscles, no dullness to percussion, clear without rales or rhonchi  Cardiovascular: RRR, heart sounds normal, no murmur or gallops, no peripheral edema  Musculoskeletal: No deformities, no cyanosis or clubbing  Neuro: alert, non focal  Skin: Warm, no lesions or rashes  04/14/13 barium swallow COMPARISON: 09/27/2012  FLUOROSCOPY TIME: 1 min, 4 seconds  FINDINGS:  Aleft eccentric 3.1 x 1.7 x 1.5 cm Zenker's diverticulum is observed  at approximately the C6-7 level of the cervical esophagus. Note is  made of interbody fusion at C5-C6-C7 which appears solid.  Pharyngeal phase of swallowing appears otherwise unremarkable.  Primary peristaltic waves in the esophagus were preserved on 4 out  of 4 swallows. Distal esophageal mucosal relief few unremarkable.  There is a small hiatal hernia. A subtle distal esophageal mucosal  ring is observed, but measuring well over 2 cm in diameter and  accordingly not expected cause symptoms. The could a 13 mm barium  tablet passed without difficulty into the stomach.  IMPRESSION:  1. Left eccentric a Zenker's diverticulum measuring up to 3.1 cm in  long axis along the lower cervical esophagus.  2. Small hiatal hernia.  3. Widely patent distal esophageal mucosal ring, well over 2 cm in  diameter and accordingly not expected to cause symptoms   07/16/12 CT scan sinuses Comparison: 12/24/2006 and earlier.  Findings: Negative visualized noncontrast brain parenchyma, orbit  soft tissues, and face soft tissues.  Visualized mastoids and tympanic cavities are clear.  Hyperplastic sphenoid sinuses remain clear.  Ethmoid air cells remain clear.  Frontal sinuses remain clear.  Maxillary sinuses remain clear. Both OMCs are patent.  Chronic rightward nasal septal deviation. No acute osseous  abnormality identified.  IMPRESSION:  Stable and clear paranasal sinuses.  CT chest 10/03/12 --  Comparison: Chest  CTs dated 08/02/2006 and 08/10/2010.  Findings: The mediastinum has a stable appearance with mild  prominence of the ascending aorta, measuring 3.2 cm transverse.  Small mediastinal lymph nodes are unchanged. There are no  pathologically enlarged mediastinal, hilar or axillary lymph nodes.  There is no pleural or pericardial effusion.  The lungs have a fairly stable appearance. There is diffuse  bronchiectasis which is most advanced within the middle lobe,  lingula and both lower lobes. There is associated bronchial wall  thickening and scattered peribronchial ground-glass nodularity.  The nodularity is fluctuating, although there is no dominant mass  or confluent airspace opacity. Expiration imaging demonstrates no  significant air trapping.  The visualized upper abdomen has a stable appearance. There are no  worrisome osseous findings.  IMPRESSION:  Little change in appearance of the lungs with diffuse  bronchiectasis and peribronchial nodularity consistent  with chronic  infection. Again this pattern can be seen with atypical  mycobacterial infection. No dominant mass or focal airspace  disease demonstrated   COUGH, CHRONIC The contributions here are very difficult to tease out. Over the last year she has had a more dry persistent cough in addition to her typical mucous producing bronchiectasis. 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 I would like her to see GI, talk about the options to deal with this.   BRONCHIECTASIS I am going to defer abx for now. She is colonized with MRSA. Not surprising that she did not improve much with levaquin. [facility]. I do not want to commit her to a year of abx without proof of continued infxn. She may require repeat FOB for cx data. I will decide about this after her GI evaluation

## 2013-05-21 NOTE — Patient Instructions (Signed)
Please continue your medications as you are taking them We will refer you to see gastroenterology to evaluate your esophagus and it's possible contribution to your cough Follow with Dr Delton CoombesByrum in 1 month

## 2013-05-21 NOTE — Assessment & Plan Note (Signed)
I am going to defer abx for now. She is colonized with MRSA. Not surprising that she did not improve much with levaquin. [facility] also [facility]. I do not want to commit her to a year of abx without proof of continued infxn. She may require repeat FOB for cx data. I will decide about this after her GI evaluation

## 2013-05-28 ENCOUNTER — Ambulatory Visit (INDEPENDENT_AMBULATORY_CARE_PROVIDER_SITE_OTHER): Payer: Medicare Other

## 2013-05-28 DIAGNOSIS — J309 Allergic rhinitis, unspecified: Secondary | ICD-10-CM

## 2013-06-04 ENCOUNTER — Other Ambulatory Visit: Payer: Self-pay | Admitting: *Deleted

## 2013-06-04 ENCOUNTER — Ambulatory Visit (INDEPENDENT_AMBULATORY_CARE_PROVIDER_SITE_OTHER): Payer: Medicare Other

## 2013-06-04 DIAGNOSIS — J309 Allergic rhinitis, unspecified: Secondary | ICD-10-CM

## 2013-06-04 MED ORDER — OSELTAMIVIR PHOSPHATE 75 MG PO CAPS
75.0000 mg | ORAL_CAPSULE | Freq: Every day | ORAL | Status: DC
Start: 2013-06-04 — End: 2013-07-02

## 2013-06-11 ENCOUNTER — Ambulatory Visit (INDEPENDENT_AMBULATORY_CARE_PROVIDER_SITE_OTHER): Payer: Medicare Other

## 2013-06-11 DIAGNOSIS — J309 Allergic rhinitis, unspecified: Secondary | ICD-10-CM | POA: Diagnosis not present

## 2013-06-18 ENCOUNTER — Ambulatory Visit (INDEPENDENT_AMBULATORY_CARE_PROVIDER_SITE_OTHER): Payer: Medicare Other

## 2013-06-18 DIAGNOSIS — J309 Allergic rhinitis, unspecified: Secondary | ICD-10-CM

## 2013-06-20 ENCOUNTER — Telehealth: Payer: Self-pay | Admitting: Emergency Medicine

## 2013-06-20 ENCOUNTER — Telehealth: Payer: Self-pay

## 2013-06-20 DIAGNOSIS — K225 Diverticulum of esophagus, acquired: Secondary | ICD-10-CM

## 2013-06-20 NOTE — Telephone Encounter (Signed)
Mary MuskratSheri Lynn Jones, RN at 06/20/2013 4:44 PM     Status: Signed        I have left a detailed message for the patient that she needs to see an ENT for Zenker's diverticulum not GI. I have left her a message that I needed to cancel her appt with Dr. Russella DarStark. She does have a follow up appointment with Dr. Delton CoombesByrum for 06/24/13 I have left a message for Dr. Kavin LeechByrum's office that Dr. Russella DarStark asked that I cancel the upcoming appt for the patient and she needs referred to ENT.   -0-   Dr. Delton CoombesBYrum please advise thanks

## 2013-06-20 NOTE — Telephone Encounter (Signed)
I am placing referral order now for a referral to ENT. Thanks

## 2013-06-20 NOTE — Telephone Encounter (Signed)
I have left a detailed message for the patient that she needs to see an ENT for Zenker's diverticulum not GI. I have left her a message that I needed to cancel her appt with Dr. Russella DarStark.  She does have a follow up appointment with Dr. Delton CoombesByrum for 06/24/13  I have left a message for Dr. Kavin LeechByrum's office that Dr. Russella DarStark asked that I cancel the upcoming appt for the patient and she needs referred to ENT.

## 2013-06-23 ENCOUNTER — Ambulatory Visit: Payer: Medicare Other | Admitting: Gastroenterology

## 2013-06-24 ENCOUNTER — Ambulatory Visit (INDEPENDENT_AMBULATORY_CARE_PROVIDER_SITE_OTHER): Payer: Medicare Other

## 2013-06-24 ENCOUNTER — Ambulatory Visit: Payer: Medicare Other | Admitting: Emergency Medicine

## 2013-06-24 DIAGNOSIS — J309 Allergic rhinitis, unspecified: Secondary | ICD-10-CM

## 2013-06-25 ENCOUNTER — Ambulatory Visit: Payer: Medicare Other

## 2013-06-30 DIAGNOSIS — R05 Cough: Secondary | ICD-10-CM | POA: Diagnosis not present

## 2013-06-30 DIAGNOSIS — R059 Cough, unspecified: Secondary | ICD-10-CM | POA: Diagnosis not present

## 2013-06-30 DIAGNOSIS — R1313 Dysphagia, pharyngeal phase: Secondary | ICD-10-CM | POA: Diagnosis not present

## 2013-06-30 DIAGNOSIS — K225 Diverticulum of esophagus, acquired: Secondary | ICD-10-CM | POA: Diagnosis not present

## 2013-07-02 ENCOUNTER — Ambulatory Visit (INDEPENDENT_AMBULATORY_CARE_PROVIDER_SITE_OTHER): Payer: Medicare Other

## 2013-07-02 ENCOUNTER — Encounter: Payer: Self-pay | Admitting: Internal Medicine

## 2013-07-02 ENCOUNTER — Ambulatory Visit (INDEPENDENT_AMBULATORY_CARE_PROVIDER_SITE_OTHER): Payer: Medicare Other | Admitting: Internal Medicine

## 2013-07-02 VITALS — BP 106/60 | HR 88 | Ht 64.0 in | Wt 102.0 lb

## 2013-07-02 DIAGNOSIS — J309 Allergic rhinitis, unspecified: Secondary | ICD-10-CM | POA: Diagnosis not present

## 2013-07-02 DIAGNOSIS — R059 Cough, unspecified: Secondary | ICD-10-CM

## 2013-07-02 DIAGNOSIS — R05 Cough: Secondary | ICD-10-CM | POA: Diagnosis not present

## 2013-07-02 DIAGNOSIS — J301 Allergic rhinitis due to pollen: Secondary | ICD-10-CM

## 2013-07-02 NOTE — Progress Notes (Signed)
Patient ID: Mary [name], female    DOB: 11/13/1945, 68 y.o.   MRN: [id number] HPI Mary [name] is a 68 year old woman whom I have followed for chronic cough. 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 Evaluated 2011 by Dr Maple Hudson for allergies - shows multiple sensitivities that are present year round.   ROV 07/12/09 -- returns for f/u. Tells me that her PND has actually improved since last visit. Her regimen is benadryl  two times a day, NSW two times a day, flutter valve two times a day - sometimes productive but often not. Also on rotating doxy + azithromycin. No flares of bronchiectasis since last visit. She is contemplating starting allergy shots, hopefully can get them from her PCP.   ROV 10/01/09 -- f/u bronchiectasis, cough. Flare that started about a week ago. Began treatment with Avelox and set up this follow up. Was exposed to husband who had a URI. Changed her benadryl to Claritin D. She has not started allergy shots yet. Starting to feel better, not back to baseline. never started Singulair.   ROV 01/24/10 -- Hx chronic cough, bronchiectasis. Tried daliresp, couldn't tolerate due to insomnia. Still with clear drainage, never responded to treatment with nasal steroid. On benadryl qhs. Taking alternating doxy and azithro monthly. Considering starting allergy shots - discussing w Dr Maple Hudson.   ROV 08/05/10 -- bronchiectasis, chronic cough, severe allergic rhinitis. Doing NSW qd and flutter qd.  She is on allergy shots and nasal gtt may be better. She is alternating doxy with azithro at the beginning of every month. Her cough is predicatble - morning and afternoon. Worst if she has been off abx for an extended period. Last CT scan was 2008.   10/19/10-  30 yo F  Never smoker followed for bronchiectasis/ hx M.avium, chronic cough, severe allergic rhinitis Started allergy vaccine in January, now at 1:50 with occasional minimal  local soreness but no problems. Says the spring pollen season was "rough"- increased nasal stuffiness and cough. Has been better in last 2 months. Still takes benadryl once each night. Hx of trying many nasal sprays. Still some nasal congestion and drip at times. She is interested in getting her shots in [name] at her PCP. allergy profile- IgE 2.4, negative common allergens. Negative Fungal antibody panel  01/20/11-  38 yo F  Never smoker followed for bronchiectasis/ hx M.avium, chronic cough (Dr Delton Coombes), severe allergic rhinitis She returns for followup of her allergy vaccine therapy begun in January 2012. She says she had 2 good weeks at the beginning of September but now feels nasal congestion in the morning with watery rhinorrhea later in the day "like a cold". She has not been taking antihistamines very often, but prefers Benadryl. Cough is productive of clear to yellow sputum with no blood or fever. Dr Delton Coombes is alternating antibiotics. Flu shot is pending with her primary physician  ROV 05/05/11 -- bronchiectasis, chronic cough, severe allergic rhinitis. Hx MAIC and also MRSA. She has started allergy shots w Dr Maple Hudson. She still has congestion and drainage, assoc w dry cough. She is alternating abx (doxy/azithro) beginning every month. The cough and sputum is bad between abx and she often has to start early. Doxy is better than azithro, but it is taking longer for it to be effective. She is wondering if she might be having some reflux.   ROV 06/21/11 -- bronchiectasis w hx MAIC + MRSA, chronic cough both due to bronchiectasis and to  UA irritation, severe allergic rhinitis. Returns after sputum cx >> shows MAIC, sensitivities not yet available. She continues to have cough that is most productive in the am and at the end of the day.   ROV 08/04/11 -- bronchiectasis w hx MAIC + MRSA, chronic cough both due to bronchiectasis and to UA irritation, severe allergic rhinitis. We started clarithro + ethambutol  for her [facility], started it on 4/15. She has the bitter taste in her mouth, had this last time. I had thought this was due to rifabutin, but possibly due to clarithro. Not currently on doxy. Since last time she thinks she had URI, now probably back to baseline.   ROV 11/13/11 -- bronchiectasis w hx MAIC + MRSA, chronic cough both due to bronchiectasis and to UA irritation, severe allergic rhinitis. Has been on clarithro/ethambutol since 07/17/11. She has less energy on the abx. She continues to have cough that is productive of either green or clear. The cough is most bothersome at night, can wake her from sleep. She coughs every morning - clears secretions. Then around mid-day she has another bout, then again in the evening.    ROV 01/22/12 -- bronchiectasis w hx MAIC + MRSA, chronic cough both due to bronchiectasis and to UA irritation, severe allergic rhinitis. We treated [facility] with erythromycin and ethambutol since April '13. She finished the abx 01/12/12 (6 months). She continues to have cough and UA irritation.  Her nasal gtt is better but not gone; has never benefited from nasal steroid, astelin. Needs repeat CT scan 08/2012.   07/03/12- 65 yo F  Never smoker followed for bronchiectasis/ hx M.avium, chronic cough (Dr Delton Coombes), severe allergic rhinitis FOLLOWS FOR: sneezing and runny nose; cough and drainage as well. Still on allergy vaccine Missed her allergy shots 1:10 GH for month of March after fall with cracked pelvis.  Incvreased sneeze, watery rhinorhea, much post-nasal drip. No headache, ears, purulent. Blames PND for increased cough from her bronchiectasis.  Taking a Sudafed-PE product containing an antihistamine, nasal saline irrigation.  ROV 07/24/12 -- bronchiectasis w hx MAIC + MRSA, chronic cough both due to bronchiectasis and to UA irritation, severe allergic rhinitis. She is on immunotherapy per Dr Maple Hudson, doing NSW's. Had never benefited from nasal steroid or astelin in the past. Started dymista  on 4/2 from dr Maple Hudson. Also had Ct scan sinuses > all clear 07/16/12.  She unfortunately fell and broke her pelvis in early March. Her cough continues, still yellow/green/brown. Occasionally some blood from nose or sputum. Breathing stable. Not currently on rotating abx.   ROV 09/25/12 -- bronchiectasis w hx MAIC + MRSA, chronic cough both due to bronchiectasis and to UA irritation, severe allergic rhinitis. We restarted rotating abx after last visit. She notes that sputum is no longer as colored, still thick. Alternating doxy and clarithro.  She has also been on Keflex long term for recurrent UTI's. Sh eis back on allergy shots.   910/1/14-  71 yo F  Never smoker followed for bronchiectasis/ hx M.avium, chronic cough (Dr Delton Coombes), severe allergic/ vasomotor rhinitis ( Dr Maple Hudson) 07/16/12 CT scan sinuses Comparison: 12/24/2006 and earlier.  Findings: Negative visualized noncontrast brain parenchyma, orbit  soft tissues, and face soft tissues.  Visualized mastoids and tympanic cavities are clear.  Hyperplastic sphenoid sinuses remain clear.  Ethmoid air cells remain clear.  Frontal sinuses remain clear.  Maxillary sinuses remain clear. Both OMCs are patent.  Chronic rightward nasal septal deviation. No acute osseous  abnormality identified.  IMPRESSION:  Stable and clear paranasal sinuses. BRONCHIECTASIS - continue allergy regimen - continue inhaled meds - repeat CT scan chest to compare w priors, will call her to discuss results - rov 6 mon  01/01/13- 68 yo F  Never smoker followed for bronchiectasis/ hx M.avium, chronic cough (Dr Delton CoombesByrum), severe allergic/ vasomotor rhinitis ( Dr Maple HudsonYoung) FOLLOWS FOR:  Symptoms unchanged since last OV.  Still with cough, runny nose and sneezing Get flu vaccine at PCP. Continues Allergy vaccine 1:10 GH Perennial cough sneeze and drainage. Failed steroid nasal spray. Doesn't recall either Dymista or ipratropium.  07/02/13- 68 yo F  Never smoker followed for  bronchiectasis/ hx M.avium, chronic cough (Dr Delton CoombesByrum), severe allergic/ vasomotor rhinitis ( Dr Maple HudsonYoung) FOLLOWS FOR:  Allergy Vaccine 1:10 GH doing well.  Still having symptoms of PND, sinus pressure and increased sneezing She is having some cough and nasal discharge now in the middle of peak spring pollen. Not clear how important allergy vaccine is currently. We discussed option to stop. Cough productive of thick mucus is unchanged. She continues treatment for Mycobacterium avium. She is not sure how much of her cough is due to postnasal drip.  ROS-see HPI Constitutional:   No-   weight loss, night sweats, fevers, chills, fatigue, lassitude. HEENT:   No-  headaches, difficulty swallowing, tooth/dental problems, sore throat,       + sneezing, No-itching, ear ache, +nasal congestion, +post nasal drip,  CV:  No-   chest pain, orthopnea, PND, swelling in lower extremities, anasarca, dizziness, palpitations Resp: No-   shortness of breath with exertion or at rest.             +productive cough,  + non-productive cough,  No- coughing up of blood.              No-   change in color of mucus.  No- wheezing.   Skin: No-   rash or lesions. GI:  No-   heartburn, indigestion, abdominal pain, nausea, vomiting, GU:  MS:  No-   joint pain or swelling. Neuro-     nothing unusual Psych:  No- change in mood or affect. No depression or anxiety.  No memory loss.  OBJ- Physical Exam General- Alert, Oriented, Affect-appropriate, Distress- none acute Skin- rash-none, lesions- none, excoriation- none Lymphadenopathy- none Head- atraumatic            Eyes- Gross vision intact, PERRLA, conjunctivae and secretions clear            Ears- Hearing, canals-normal            Nose- +watery, + R Septal dev, polyps, erosion, perforation             Throat- Mallampati II , mucosa clear , drainage- none, tonsils- atrophic Neck- flexible , trachea midline, no stridor , thyroid nl, carotid no bruit Chest - symmetrical  excursion , unlabored           Heart/CV- RRR , no murmur , no gallop  , no rub, nl s1 s2                           - JVD- none , edema- none, stasis changes- none, varices- none           Lung- + minimal crackles, wheeze- none, cough- none , dullness-none, rub- none           Chest wall-  Abd-  Br/ Gen/ Rectal- Not done, not indicated Extrem-  cyanosis- none, clubbing, none, atrophy- none, strength- nl Neuro- grossly intact to observation

## 2013-07-02 NOTE — Patient Instructions (Signed)
Increase your allergy shot schedule to every 2 weeks, while we watch to see if we think the shots are helping enough to continue.  Try adding an otc antihsitamine like Allegra/ fexofenadine, and/ or an otc decongestant like Sudafed-PE

## 2013-07-08 ENCOUNTER — Ambulatory Visit (INDEPENDENT_AMBULATORY_CARE_PROVIDER_SITE_OTHER): Payer: Medicare Other

## 2013-07-08 DIAGNOSIS — J309 Allergic rhinitis, unspecified: Secondary | ICD-10-CM | POA: Diagnosis not present

## 2013-07-09 ENCOUNTER — Ambulatory Visit: Payer: Medicare Other

## 2013-07-11 ENCOUNTER — Ambulatory Visit: Payer: Medicare Other | Admitting: Emergency Medicine

## 2013-07-16 ENCOUNTER — Ambulatory Visit (INDEPENDENT_AMBULATORY_CARE_PROVIDER_SITE_OTHER): Payer: Medicare Other

## 2013-07-16 DIAGNOSIS — J309 Allergic rhinitis, unspecified: Secondary | ICD-10-CM

## 2013-07-23 ENCOUNTER — Other Ambulatory Visit (HOSPITAL_COMMUNITY): Payer: Self-pay | Admitting: Otolaryngology

## 2013-07-23 ENCOUNTER — Ambulatory Visit: Payer: Medicare Other

## 2013-07-23 ENCOUNTER — Encounter (HOSPITAL_COMMUNITY): Payer: Self-pay

## 2013-07-23 ENCOUNTER — Encounter (HOSPITAL_COMMUNITY)
Admission: RE | Admit: 2013-07-23 | Discharge: 2013-07-23 | Disposition: A | Discharge status: Home / Self Care | Payer: Medicare Other | Source: Ambulatory Visit | Attending: Otolaryngology | Admitting: Otolaryngology

## 2013-07-23 ENCOUNTER — Ambulatory Visit (HOSPITAL_COMMUNITY)
Admission: RE | Admit: 2013-07-23 | Discharge: 2013-07-23 | Disposition: A | Discharge status: Home / Self Care | Payer: Medicare Other | Source: Ambulatory Visit | Attending: Anesthesiology | Admitting: Anesthesiology

## 2013-07-23 DIAGNOSIS — Z01810 Encounter for preprocedural cardiovascular examination: Secondary | ICD-10-CM | POA: Insufficient documentation

## 2013-07-23 DIAGNOSIS — Z01812 Encounter for preprocedural laboratory examination: Secondary | ICD-10-CM | POA: Insufficient documentation

## 2013-07-23 DIAGNOSIS — J479 Bronchiectasis, uncomplicated: Secondary | ICD-10-CM | POA: Diagnosis not present

## 2013-07-23 DIAGNOSIS — J449 Chronic obstructive pulmonary disease, unspecified: Secondary | ICD-10-CM | POA: Diagnosis not present

## 2013-07-23 DIAGNOSIS — Z01818 Encounter for other preprocedural examination: Secondary | ICD-10-CM | POA: Diagnosis not present

## 2013-07-23 HISTORY — DX: Gastro-esophageal reflux disease without esophagitis: K21.9

## 2013-07-23 HISTORY — DX: Pain in unspecified joint: M25.50

## 2013-07-23 HISTORY — DX: Personal history of urinary (tract) infections: Z87.440

## 2013-07-23 HISTORY — DX: Unspecified osteoarthritis, unspecified site: M19.90

## 2013-07-23 HISTORY — DX: Other specified postprocedural states: R11.2

## 2013-07-23 HISTORY — DX: Pneumonia, unspecified organism: J18.9

## 2013-07-23 HISTORY — DX: Age-related osteoporosis without current pathological fracture: M81.0

## 2013-07-23 HISTORY — DX: Personal history of Methicillin resistant Staphylococcus aureus infection: Z86.14

## 2013-07-23 HISTORY — DX: Other specified postprocedural states: Z98.890

## 2013-07-23 LAB — BASIC METABOLIC PANEL
BUN: 10 mg/dL (ref 6–23)
CALCIUM: 8.9 mg/dL (ref 8.4–10.5)
CO2: 24 mEq/L (ref 19–32)
CREATININE: 0.53 mg/dL (ref 0.50–1.10)
Chloride: 97 mEq/L (ref 96–112)
GFR calc Af Amer: >90 mL/min (ref >90)
GFR calc non Af Amer: >90 mL/min (ref >90)
GLUCOSE: 113 mg/dL — AB (ref 70–99)
Potassium: 3.8 mEq/L (ref 3.7–5.3)
Sodium: 136 mEq/L — ABNORMAL LOW (ref 137–147)

## 2013-07-23 LAB — CBC
HCT: 42.1 % (ref 36.0–46.0)
Hemoglobin: 14.2 g/dL (ref 12.0–15.0)
MCH: 29.8 pg (ref 26.0–34.0)
MCHC: 33.7 g/dL (ref 30.0–36.0)
MCV: 88.3 fL (ref 78.0–100.0)
PLATELETS: 238 10*3/uL (ref 150–400)
RBC: 4.77 MIL/uL (ref 3.87–5.11)
RDW: 12.8 % (ref 11.5–15.5)
WBC: 7.1 10*3/uL (ref 4.0–10.5)

## 2013-07-23 NOTE — Progress Notes (Signed)
Dr,Byrum is Pulmonologist with last visit over a month ago

## 2013-07-23 NOTE — Progress Notes (Signed)
Pt doesn't have a cardiologist  Denies ever having an echo/stress test/heart cath  Medical Md Dr.Steve Luking  Denies EKG or CXR in past yr

## 2013-07-23 NOTE — Pre-Procedure Instructions (Signed)
Mary ApleySandra M [name]  07/23/2013   Your procedure is scheduled on:  Fri, May 1 @ 7:30 AM  [facility]  at 5:30 AM.  Call this number if you have problems the morning of surgery: [phone]   Remember:   Do not eat food or drink liquids after midnight.   Take these medicines the morning of surgery with A SIP OF WATER: Albuterol<Bring Your Inhaler With You> and Omeprazole(Prilosec)               Stop taking your Aspirin. No Goody's,BC's,Aleve,Ibuprofen,Fish Oil,or any Herbal Medications   Do not wear jewelry, make-up or nail polish.  Do not wear lotions, powders, or perfumes.   Do not shave 48 hours prior to surgery.   Do not bring valuables to the hospital.  [facility] not responsible                  for any belongings or valuables.               Contacts, dentures or bridgework may not be worn into surgery.  Leave suitcase in the car. After surgery it may be brought to your room.  For patients admitted to the hospital, discharge time is determined by your                treatment team.               Patients discharged the day of surgery will not be allowed to drive  home.    Special Instructions:  [place] - Preparing for Surgery  Before surgery, you can play an important role.  Because skin is not sterile, your skin needs to be as free of germs as possible.  You can reduce the number of germs on you skin by washing with CHG (chlorahexidine gluconate) soap before surgery.  CHG is an antiseptic cleaner which kills germs and bonds with the skin to continue killing germs even after washing.  Please DO NOT use if you have an allergy to CHG or antibacterial soaps.  If your skin becomes reddened/irritated stop using the CHG and inform your nurse when you arrive at Short Stay.  Do not shave (including legs and underarms) for at least 48 hours prior to the first CHG shower.  You may shave your face.  Please follow these instructions carefully:   1.  Shower  with CHG Soap the night before surgery and the                                morning of Surgery.  2.  If you choose to wash your hair, wash your hair first as usual with your       normal shampoo.  3.  After you shampoo, rinse your hair and body thoroughly to remove the                      Shampoo.  4.  Use CHG as you would any other liquid soap.  You can apply chg directly       to the skin and wash gently with scrungie or a clean washcloth.  5.  Apply the CHG Soap to your body ONLY FROM THE NECK DOWN.        Do not use on open wounds or open sores.  Avoid contact with your eyes,  ears, mouth and genitals (private parts).  Wash genitals (private parts)       with your normal soap.  6.  Wash thoroughly, paying special attention to the area where your surgery        will be performed.  7.  Thoroughly rinse your body with warm water from the neck down.  8.  DO NOT shower/wash with your normal soap after using and rinsing off       the CHG Soap.  9.  Pat yourself dry with a clean towel.            10.  Wear clean pajamas.            11.  Place clean sheets on your bed the night of your first shower and do not        sleep with pets.  Day of Surgery  Do not apply any lotions/deoderants the morning of surgery.  Please wear clean clothes to the hospital/surgery center.     Please read over the following fact sheets that you were given: Pain Booklet, Coughing and Deep Breathing and Surgical Site Infection Prevention

## 2013-07-24 NOTE — Progress Notes (Signed)
Anesthesia chart review: Patient is a 68 year old female psoted for Zenker's diverticulectomy endoscopic on 08/01/13 by Dr. Jenne PaneBates.  History includes nonsmoker, bronchiectasis with history of MAC and of MRSA colonization, postoperative nausea and vomiting, osteoporosis, GERD, arthritis, cervical fusion. PCP is Dr. Lubertha SouthSteve Luking.  Primary pulmonologist is Dr. Levy Pupaobert Byrum.  He is the one who referred her to ENT for treatment of her Zenker's diverticula.  According to his 05/21/13 note, he will consider repeat FOB for culture data, but not until after her Zenker's evaluation. She has also seen Dr. Jetty Duhamellinton Young for allergies and severe vasomotor rhinitis.  EKG on 07/23/13 showed NSR, cannot rule out anterior infarct (age undetermined).  It was not felt significantly changed since her last tracing from 07/26/06 (see Muse).  CXR on 07/23/13 showed: Findings consistent with chronic obstructive pulmonary disease. Bilateral nodular densities are noted consistent with chronic inflammation or infection as described on prior CT scan. No acute cardiopulmonary abnormality seen.  Chest CT on 09/27/12 showed: Little change (from 08/10/10) in appearance of the lungs with diffuse bronchiectasis and peribronchial nodularity consistent with chronic infection. Again this pattern can be seen with atypical mycobacterial infection. No dominant mass or focal airspace disease demonstrated.  Preoperative labs noted.  Patient with close pulmonology follow-up. CXR stable. Dr. Delton CoombesByrum referred her to ENT.  Further evaluation by her assigned anesthesiologist on the day of surgery, but if no acute changes or pulmonary exacerbations then I would anticipate that she could proceed as planned.  Velna Ochsllison Eman Morimoto, PA-C [facility]/Anesthesiology Phone [phone] 07/24/2013 8:13 PM

## 2013-07-30 ENCOUNTER — Ambulatory Visit (INDEPENDENT_AMBULATORY_CARE_PROVIDER_SITE_OTHER): Payer: Medicare Other

## 2013-07-30 DIAGNOSIS — J309 Allergic rhinitis, unspecified: Secondary | ICD-10-CM | POA: Diagnosis not present

## 2013-07-31 NOTE — Progress Notes (Signed)
Called and informed to be here at 0830 for surgery at 1030 tomorrow.

## 2013-08-01 ENCOUNTER — Observation Stay (HOSPITAL_COMMUNITY): Payer: Medicare Other

## 2013-08-01 ENCOUNTER — Inpatient Hospital Stay (HOSPITAL_COMMUNITY)
Admission: RE | Admit: 2013-08-01 | Discharge: 2013-08-05 | DRG: 327 | Disposition: A | Discharge status: Home / Self Care | Payer: Medicare Other | Source: Ambulatory Visit | Attending: Otolaryngology | Admitting: Otolaryngology

## 2013-08-01 ENCOUNTER — Encounter (HOSPITAL_COMMUNITY)
Admission: RE | Disposition: A | Discharge status: Home / Self Care | Payer: Self-pay | Source: Ambulatory Visit | Attending: Otolaryngology

## 2013-08-01 ENCOUNTER — Ambulatory Visit (HOSPITAL_COMMUNITY): Payer: Medicare Other | Admitting: Anesthesiology

## 2013-08-01 ENCOUNTER — Encounter (HOSPITAL_COMMUNITY): Payer: Medicare Other | Admitting: Vascular Surgery

## 2013-08-01 ENCOUNTER — Encounter (HOSPITAL_COMMUNITY): Payer: Self-pay | Admitting: Anesthesiology

## 2013-08-01 DIAGNOSIS — K225 Diverticulum of esophagus, acquired: Secondary | ICD-10-CM | POA: Diagnosis not present

## 2013-08-01 DIAGNOSIS — J309 Allergic rhinitis, unspecified: Secondary | ICD-10-CM | POA: Diagnosis present

## 2013-08-01 DIAGNOSIS — M810 Age-related osteoporosis without current pathological fracture: Secondary | ICD-10-CM | POA: Diagnosis present

## 2013-08-01 DIAGNOSIS — E440 Moderate protein-calorie malnutrition: Secondary | ICD-10-CM | POA: Diagnosis not present

## 2013-08-01 DIAGNOSIS — R1310 Dysphagia, unspecified: Secondary | ICD-10-CM | POA: Diagnosis present

## 2013-08-01 DIAGNOSIS — Z8614 Personal history of Methicillin resistant Staphylococcus aureus infection: Secondary | ICD-10-CM | POA: Diagnosis not present

## 2013-08-01 DIAGNOSIS — K219 Gastro-esophageal reflux disease without esophagitis: Secondary | ICD-10-CM | POA: Diagnosis present

## 2013-08-01 DIAGNOSIS — Z7982 Long term (current) use of aspirin: Secondary | ICD-10-CM | POA: Diagnosis not present

## 2013-08-01 DIAGNOSIS — J690 Pneumonitis due to inhalation of food and vomit: Secondary | ICD-10-CM | POA: Diagnosis not present

## 2013-08-01 DIAGNOSIS — R490 Dysphonia: Secondary | ICD-10-CM | POA: Diagnosis not present

## 2013-08-01 DIAGNOSIS — Z681 Body mass index (BMI) 19 or less, adult: Secondary | ICD-10-CM

## 2013-08-01 DIAGNOSIS — Z79899 Other long term (current) drug therapy: Secondary | ICD-10-CM

## 2013-08-01 DIAGNOSIS — Z5189 Encounter for other specified aftercare: Secondary | ICD-10-CM | POA: Diagnosis not present

## 2013-08-01 HISTORY — DX: Chronic obstructive pulmonary disease, unspecified: J44.9

## 2013-08-01 HISTORY — PX: ZENKER'S DIVERTICULECTOMY ENDOSCOPIC: SHX6191

## 2013-08-01 LAB — MRSA PCR SCREENING: MRSA by PCR: NEGATIVE

## 2013-08-01 SURGERY — ZENKER'S DIVERTICULECTOMY ENDOSCOPIC
Anesthesia: General | Site: Throat

## 2013-08-01 MED ORDER — MORPHINE SULFATE 2 MG/ML IJ SOLN
2.0000 mg | INTRAMUSCULAR | Status: DC | PRN
  Administered 2013-08-01 – 2013-08-02 (×2): 2 mg via INTRAVENOUS
  Filled 2013-08-01 (×2): qty 1

## 2013-08-01 MED ORDER — DEXAMETHASONE SODIUM PHOSPHATE 4 MG/ML IJ SOLN
INTRAMUSCULAR | Status: AC
  Filled 2013-08-01: qty 2

## 2013-08-01 MED ORDER — JEVITY 1.2 CAL PO LIQD
1000.0000 mL | ORAL | Status: DC
  Administered 2013-08-02 – 2013-08-03 (×2): 1000 mL
  Filled 2013-08-01 (×7): qty 1000

## 2013-08-01 MED ORDER — 0.9 % SODIUM CHLORIDE (POUR BTL) OPTIME
TOPICAL | Status: DC | PRN
  Administered 2013-08-01: 1000 mL

## 2013-08-01 MED ORDER — PROPOFOL 10 MG/ML IV BOLUS
INTRAVENOUS | Status: DC | PRN
  Administered 2013-08-01: 110 mg via INTRAVENOUS

## 2013-08-01 MED ORDER — PANTOPRAZOLE SODIUM 40 MG PO TBEC: 40.0000 mg | DELAYED_RELEASE_TABLET | Freq: Every day | ORAL | Status: DC

## 2013-08-01 MED ORDER — MIDAZOLAM HCL 5 MG/5ML IJ SOLN
INTRAMUSCULAR | Status: DC | PRN
  Administered 2013-08-01: 2 mg via INTRAVENOUS

## 2013-08-01 MED ORDER — TRIAMCINOLONE ACETONIDE 55 MCG/ACT NA AERO
2.0000 | INHALATION_SPRAY | Freq: Every day | NASAL | Status: DC
  Filled 2013-08-01: qty 21.6

## 2013-08-01 MED ORDER — KCL IN DEXTROSE-NACL 20-5-0.45 MEQ/L-%-% IV SOLN
INTRAVENOUS | Status: DC
  Administered 2013-08-01 – 2013-08-02 (×3): via INTRAVENOUS
  Filled 2013-08-01 (×6): qty 1000

## 2013-08-01 MED ORDER — ARTIFICIAL TEARS OP OINT
TOPICAL_OINTMENT | OPHTHALMIC | Status: DC | PRN
Start: 2013-08-01 — End: 2013-08-01
  Administered 2013-08-01: 1 via OPHTHALMIC

## 2013-08-01 MED ORDER — ONDANSETRON HCL 4 MG/2ML IJ SOLN: 4.0000 mg | Freq: Once | INTRAMUSCULAR | Status: DC | PRN

## 2013-08-01 MED ORDER — PROMETHAZINE HCL 12.5 MG PO TABS
12.5000 mg | ORAL_TABLET | Freq: Four times a day (QID) | ORAL | Status: DC | PRN
  Filled 2013-08-01: qty 1

## 2013-08-01 MED ORDER — GLYCOPYRROLATE 0.2 MG/ML IJ SOLN
INTRAMUSCULAR | Status: DC | PRN
  Administered 2013-08-01: 0.4 mg via INTRAVENOUS

## 2013-08-01 MED ORDER — FENTANYL CITRATE 0.05 MG/ML IJ SOLN
INTRAMUSCULAR | Status: DC | PRN
  Administered 2013-08-01: 150 ug via INTRAVENOUS
  Administered 2013-08-01: 50 ug via INTRAVENOUS

## 2013-08-01 MED ORDER — ROCURONIUM BROMIDE 100 MG/10ML IV SOLN
INTRAVENOUS | Status: DC | PRN
  Administered 2013-08-01: 30 mg via INTRAVENOUS

## 2013-08-01 MED ORDER — PROPOFOL 10 MG/ML IV BOLUS
INTRAVENOUS | Status: AC
  Filled 2013-08-01: qty 40

## 2013-08-01 MED ORDER — ALBUTEROL SULFATE HFA 108 (90 BASE) MCG/ACT IN AERS: 2.0000 | INHALATION_SPRAY | RESPIRATORY_TRACT | Status: DC | PRN

## 2013-08-01 MED ORDER — FENTANYL CITRATE 0.05 MG/ML IJ SOLN
INTRAMUSCULAR | Status: AC
  Filled 2013-08-01: qty 5

## 2013-08-01 MED ORDER — ALBUTEROL SULFATE (2.5 MG/3ML) 0.083% IN NEBU: 2.5000 mg | INHALATION_SOLUTION | RESPIRATORY_TRACT | Status: DC | PRN

## 2013-08-01 MED ORDER — PROMETHAZINE HCL 12.5 MG RE SUPP
12.5000 mg | Freq: Four times a day (QID) | RECTAL | Status: DC | PRN
  Filled 2013-08-01: qty 1

## 2013-08-01 MED ORDER — ONDANSETRON HCL 4 MG/2ML IJ SOLN
INTRAMUSCULAR | Status: DC | PRN
  Administered 2013-08-01: 4 mg via INTRAVENOUS

## 2013-08-01 MED ORDER — LIDOCAINE HCL (CARDIAC) 20 MG/ML IV SOLN
INTRAVENOUS | Status: DC | PRN
  Administered 2013-08-01: 80 mg via INTRAVENOUS

## 2013-08-01 MED ORDER — ONDANSETRON HCL 4 MG/2ML IJ SOLN
INTRAMUSCULAR | Status: AC
  Filled 2013-08-01: qty 2

## 2013-08-01 MED ORDER — LACTATED RINGERS IV SOLN
INTRAVENOUS | Status: DC
  Administered 2013-08-01: 09:00:00 via INTRAVENOUS

## 2013-08-01 MED ORDER — IOHEXOL 300 MG/ML  SOLN
150.0000 mL | Freq: Once | INTRAMUSCULAR | Status: AC | PRN
  Administered 2013-08-01: 75 mL via ORAL

## 2013-08-01 MED ORDER — MIDAZOLAM HCL 2 MG/2ML IJ SOLN
INTRAMUSCULAR | Status: AC
  Filled 2013-08-01: qty 4

## 2013-08-01 MED ORDER — ALENDRONATE SODIUM 70 MG PO TABS: 70.0000 mg | ORAL_TABLET | ORAL | Status: DC

## 2013-08-01 MED ORDER — LACTATED RINGERS IV SOLN
INTRAVENOUS | Status: DC | PRN
  Administered 2013-08-01 (×2): via INTRAVENOUS

## 2013-08-01 MED ORDER — NEOSTIGMINE METHYLSULFATE 10 MG/10ML IV SOLN
INTRAVENOUS | Status: DC | PRN
  Administered 2013-08-01: 4 mg via INTRAVENOUS

## 2013-08-01 MED ORDER — LIDOCAINE HCL (CARDIAC) 20 MG/ML IV SOLN
INTRAVENOUS | Status: AC
  Filled 2013-08-01: qty 5

## 2013-08-01 MED ORDER — FENTANYL CITRATE 0.05 MG/ML IJ SOLN: 25.0000 ug | INTRAMUSCULAR | Status: DC | PRN

## 2013-08-01 MED ORDER — AMPICILLIN-SULBACTAM SODIUM 3 (2-1) G IJ SOLR
3.0000 g | Freq: Four times a day (QID) | INTRAMUSCULAR | Status: DC
  Administered 2013-08-01 – 2013-08-05 (×16): 3 g via INTRAVENOUS
  Filled 2013-08-01 (×19): qty 3

## 2013-08-01 MED ORDER — HYDROCODONE-ACETAMINOPHEN 7.5-325 MG/15ML PO SOLN
10.0000 mL | ORAL | Status: DC | PRN
  Administered 2013-08-03: 15 mL via ORAL
  Filled 2013-08-01: qty 15

## 2013-08-01 MED ORDER — ARTIFICIAL TEARS OP OINT
TOPICAL_OINTMENT | OPHTHALMIC | Status: AC
  Filled 2013-08-01: qty 3.5

## 2013-08-01 SURGICAL SUPPLY — 32 items
BLADE 10 SAFETY STRL DISP (BLADE) ×1 IMPLANT
CATH ROBINSON RED A/P 18FR (CATHETERS) IMPLANT
COVER MAYO STAND STRL (DRAPES) ×2 IMPLANT
COVER SURGICAL LIGHT HANDLE (MISCELLANEOUS) ×3 IMPLANT
COVER TABLE BACK 60X90 (DRAPES) ×3 IMPLANT
CRADLE DONUT ADULT HEAD (MISCELLANEOUS) ×2 IMPLANT
CUTTER LINEAR ENDO 35 ETS (STAPLE) ×3 IMPLANT
DRAPE PROXIMA HALF (DRAPES) ×3 IMPLANT
GLOVE BIO SURGEON STRL SZ 6.5 (GLOVE) ×2 IMPLANT
GLOVE BIO SURGEON STRL SZ7.5 (GLOVE) ×3 IMPLANT
GLOVE BIO SURGEONS STRL SZ 6.5 (GLOVE) ×2
GLOVE BIOGEL PI IND STRL 6.5 (GLOVE) IMPLANT
GLOVE BIOGEL PI INDICATOR 6.5 (GLOVE) ×2
GOWN STRL REUS W/ TWL LRG LVL3 (GOWN DISPOSABLE) IMPLANT
GOWN STRL REUS W/ TWL XL LVL3 (GOWN DISPOSABLE) IMPLANT
GOWN STRL REUS W/TWL LRG LVL3 (GOWN DISPOSABLE) ×3
GOWN STRL REUS W/TWL XL LVL3 (GOWN DISPOSABLE) ×3
GUARD TEETH (MISCELLANEOUS) ×3 IMPLANT
KIT BASIN OR (CUSTOM PROCEDURE TRAY) ×3 IMPLANT
KIT ROOM TURNOVER OR (KITS) ×3 IMPLANT
NS IRRIG 1000ML POUR BTL (IV SOLUTION) ×3 IMPLANT
PAD ARMBOARD 7.5X6 YLW CONV (MISCELLANEOUS) ×4 IMPLANT
RELOAD CUTTER ETS 35MM STAND (ENDOMECHANICALS) ×2 IMPLANT
SOLUTION ANTI FOG 6CC (MISCELLANEOUS) ×3 IMPLANT
SPONGE GAUZE 4X4 12PLY (GAUZE/BANDAGES/DRESSINGS) ×1 IMPLANT
SURGILUBE 2OZ TUBE FLIPTOP (MISCELLANEOUS) ×3 IMPLANT
SUT SILK 2 0 SH (SUTURE) ×2 IMPLANT
SUT VIC AB 2-0 UR6 27 (SUTURE) IMPLANT
TOWEL OR 17X24 6PK STRL BLUE (TOWEL DISPOSABLE) ×3 IMPLANT
TUBE CONNECTING 12'X1/4 (SUCTIONS) ×1
TUBE CONNECTING 12X1/4 (SUCTIONS) ×2 IMPLANT
WATER STERILE IRR 1000ML POUR (IV SOLUTION) ×1 IMPLANT

## 2013-08-01 NOTE — Anesthesia Procedure Notes (Signed)
Procedure Name: Intubation Date/Time: 08/01/2013 10:34 AM Performed by: Tyrone NineSAUVE, Bessie Livingood F Pre-anesthesia Checklist: Patient identified, Timeout performed, Emergency Drugs available, Suction available and Patient being monitored Patient Re-evaluated:Patient Re-evaluated prior to inductionOxygen Delivery Method: Circle system utilized Preoxygenation: Pre-oxygenation with 100% oxygen Intubation Type: IV induction Ventilation: Mask ventilation without difficulty Laryngoscope Size: Mac and 3 Grade View: Grade I Tube type: Oral Tube size: 7.5 mm Number of attempts: 1 Airway Equipment and Method: Stylet Placement Confirmation: positive ETCO2,  breath sounds checked- equal and bilateral and ETT inserted through vocal cords under direct vision Secured at: 22 cm Tube secured with: Tape Dental Injury: Teeth and Oropharynx as per pre-operative assessment

## 2013-08-01 NOTE — Progress Notes (Signed)
PHARMACIST - PHYSICIAN COMMUNICATION  CONCERNING: P&T Medication Policy Regarding Oral Bisphosphonates  RECOMMENDATION: Your order for alendronate (Fosamax), ibandronate (Boniva), or risedronate (Actonel) has been discontinued at this time.  If the patient's post-hospital medical condition warrants safe use of this class of drugs, please resume the pre-hospital regimen upon discharge.  DESCRIPTION:  Alendronate (Fosamax), ibandronate (Boniva), and risedronate (Actonel) can cause severe esophageal erosions in patients who are unable to remain upright at least 30 minutes after taking this medication.   Since brief interruptions in therapy are thought to have minimal impact on bone mineral density, the Pharmacy & Therapeutics Committee has established that bisphosphonate orders should be routinely discontinued during hospitalization.   To override this safety policy and permit administration of Boniva, Fosamax, or Actonel in the hospital, prescribers must write "DO NOT HOLD" in the comments section when placing the order for this class of medications.   Agapito GamesAlison Benson Porcaro, PharmD, BCPS Clinical Pharmacist Pager: [phone] 08/01/2013 12:28 PM

## 2013-08-01 NOTE — Progress Notes (Signed)
   ENT Progress Note: s/p Procedure(s): ZENKER'S DIVERTICULOTOMY ENDOSCOPIC   Subjective: C/O of mild ST and neck pain  Objective: Vital signs in last 24 hours: Temp:  [97.5 F (36.4 C)-98.3 F (36.8 C)] 97.5 F (36.4 C) (05/01 1216) Pulse Rate:  [60-83] 60 (05/01 1216) Resp:  [16-23] 16 (05/01 1216) BP: (127-152)/(57-71) 127/57 mmHg (05/01 1216) SpO2:  [96 %-99 %] 98 % (05/01 1216) Weight:  [46.267 kg (102 lb)] 46.267 kg (102 lb) (05/01 0830) Weight change:     Intake/Output from previous day:   Intake/Output this shift: Total I/O In: 1100 [I.V.:1100] Out: -   Labs: No results found for this basename: WBC, HGB, HCT, PLT,  in the last 72 hours No results found for this basename: NA, K, CL, CO2, GLUCOSE, BUN, CREATININR, CALCIUM,  in the last 72 hours  Studies/Results: Dg Esophagus W/water Sol Cm  08/01/2013   CLINICAL DATA:  Status post Zenker's diverticulotomy. Mucosal tear in the hypopharynx noted intraoperatively.  EXAM: ESOPHOGRAM/BARIUM SWALLOW  TECHNIQUE: Single contrast examination was performed using thin barium or water soluble.  FLUOROSCOPY TIME:  2 min and 25 seconds  COMPARISON:  04/14/2013.  FINDINGS: Immediately following the initial swallow the left-sided Zenker's diverticulum was noted, however, this was clearly smaller than the prior examination and appeared to empty into the esophagus relatively well, compatible with recent diverticulotomy. Above this, extending off the left side of the hypopharynx there was an additional accumulation of oral contrast material which was somewhat irregular in shape with ill-defined margins, compatible with an area of extravasation at a site of mucosal tear. This increased in size significantly when the patient was placed in the left lateral position, and failed to empty in the right lateral position. This finding was best demonstrated on series 36.  IMPRESSION: 1. Findings, as above, compatible with extravasation of oral contrast  through the left side of the hypopharynx presumably from the mucosal tear observed at the time of surgery. These results were called by telephone at the time of interpretation on 08/01/2013 at 3:15 PM to Dr. Christia ReadingWIGHT BATES , who verbally acknowledged these results.   Electronically Signed   By: Trudie Reedaniel  Entrikin M.D.   On: 08/01/2013 15:17     PHYSICAL EXAM: Airway stable, nl voice No neck swelling or erythema   Assessment/Plan: Swallow study results reviewed Discussed with Dr. Jenne PaneBates, plan to have NG placed and initiate TF, cont iv abx and monitor over weekend. Repeat swallow on Monday.    Mary Cohoavid Cheree Fowles 08/01/2013, 5:08 PM

## 2013-08-01 NOTE — Progress Notes (Signed)
INITIAL NUTRITION ASSESSMENT  DOCUMENTATION CODES Per approved criteria  -Non-severe (moderate) malnutrition in the context of chronic illness   INTERVENTION: 1.  Enteral nutrition; initiate Jevity 1.2 @ 20 mL/hr continuous.  Advance by 10 mL q 4 hrs to 50 mL/hr goal to provide 1440 kcal, 66g protein, 984 mL free water. 2.  Free water; once appropriate initiate free water flushes of 130 mL 4 times daily to provide 1504 mL free water today with TF.   NUTRITION DIAGNOSIS: Inadequate oral intake related to inability to eat as evidenced by Zenker's diverticulum, NPO with plans for TF.   Monitor:  1.  Enteral nutrition; initiation with tolerance.  Pt to meet >/=90% estimated needs with nutrition support.  2.  Wt/wt change; monitor trends 3.  Food/Beverage; diet advancement with tolerance once PO intake appropriate for pt.  Reason for Assessment: consult; TF initiation and management  68 y.o. female  Admitting Dx: Zenker's diverticulum  ASSESSMENT: Pt admitted with Zenker's diverticulum. Full operative report not available for review at this time.  Pt reports that she had an esophageal tear.  Orders have been placed for TF initiation.  Pt does not have access for TFs at this time.  RD met with pt and husband at bedside.  Pt is upset about the turn of events and having to get a feeding tube.  RD reviewed home nutrition with pt.  She states that her trouble swallowing has worsened over the past few months and has been "bad" for the past 6 weeks while awaiting surgery.  Pt states that she feels both solids and liquids getting stuck. She has been forcing herself to eat as much as she can.  She takes a MVI at home but does not use nutrition supplements.  She states her usual weight for several years has been 102-106 lbs. She is currently at 106 lbs.   Nutrition Focused Physical Exam: Subcutaneous Fat:  Orbital Region: WNL Upper Arm Region: moderate wasting Thoracic and Lumbar Region:  mild-moderate wasting  Muscle:  Temple Region: WNL Clavicle Bone Region: mild-moderate wasting Clavicle and Acromion Bone Region: mild-moderate wasting Scapular Bone Region: WNL Dorsal Hand: moderate wasting Patellar Region: mild wasting Anterior Thigh Region: moderate wasting Posterior Calf Region: mild-moderate wasting  Edema: none present  Pt meets criteria for moderate MALNUTRITION in the context of chronic as evidenced by PO intake <75% of estimated needs >7 days, mild-moderate muscle depletion, mild-moderate fat depletion.  Height: Ht Readings from Last 1 Encounters:  08/01/13 5' 3.5" (1.613 m)    Weight: Wt Readings from Last 1 Encounters:  08/01/13 102 lb (46.267 kg)    Ideal Body Weight: 120 lbs  % Ideal Body Weight: 85%  Wt Readings from Last 10 Encounters:  08/01/13 102 lb (46.267 kg)  08/01/13 102 lb (46.267 kg)  07/23/13 102 lb 4.7 oz (46.4 kg)  07/02/13 102 lb (46.267 kg)  05/21/13 102 lb 9.6 oz (46.539 kg)  04/09/13 102 lb (46.267 kg)  01/01/13 103 lb 3.2 oz (46.811 kg)  09/25/12 102 lb 3.2 oz (46.358 kg)  07/24/12 102 lb 3.2 oz (46.358 kg)  07/03/12 102 lb 9.6 oz (46.539 kg)    Usual Body Weight: 102 lbs  % Usual Body Weight: 100%  BMI:  Body mass index is 17.78 kg/(m^2).  Underweight  Estimated Nutritional Needs: Kcal: 1300-1500 Protein: 60-70 Fluid: >1.5 L/day  Skin: intact  Diet Order: NPO  EDUCATION NEEDS: -Education needs addressed   Intake/Output Summary (Last 24 hours) at 08/01/13 1609 Last  data filed at 08/01/13 1140  Gross per 24 hour  Intake   1100 ml  Output      0 ml  Net   1100 ml    Last BM: PTA  Labs:  No results found for this basename: NA, K, CL, CO2, BUN, CREATININE, CALCIUM, MG, PHOS, GLUCOSE,  in the last 168 hours  CBG (last 3)  No results found for this basename: GLUCAP,  in the last 72 hours  Scheduled Meds: . ampicillin-sulbactam (UNASYN) IV  3 g Intravenous Q6H  . pantoprazole  40 mg Oral Daily   . triamcinolone  2 spray Nasal Daily    Continuous Infusions: . dextrose 5 % and 0.45 % NaCl with KCl 20 mEq/L 75 mL/hr at 08/01/13 1312    Past Medical History  Diagnosis Date  . Osteoporosis   . Allergic rhinitis     using Nasocort daily and Allegra daily  . Mycobacterium avium complex   . Bronchiectasis     uses Albuterol daily as needed but has been a while since used(more than a month ago),  . Chronic cough   . Diverticulosis   . Zenker's diverticulum   . PONV (postoperative nausea and vomiting)   . Pneumonia     hx of;in the 90's  . Arthritis   . Joint pain   . GERD (gastroesophageal reflux disease)     takes Prilosec daily  . History of bladder infections     takes Keflex daily;Dr.Ottin is Urologist  . Cataracts, bilateral     immature  . History of MRSA infection     several yrs ago  . Osteoporosis     takes Fosamax weekly    Past Surgical History  Procedure Laterality Date  . Neck surgery  90's    fusion  . Breast biopsy      left  . Esophagogastroduodenoscopy    . Colonoscopy      Brynda Greathouse, MS RD LDN Clinical Inpatient Dietitian Pager: [phone] Weekend/After hours pager: [phone]

## 2013-08-01 NOTE — Transfer of Care (Signed)
Immediate Anesthesia Transfer of Care Note  Patient: Mary ApleySandra M Parkin  Procedure(s) Performed: Procedure(s): ZENKER'S DIVERTICULOTOMY ENDOSCOPIC (N/A)  Patient Location: PACU  Anesthesia Type:General  Level of Consciousness: awake, alert , oriented and patient cooperative  Airway & Oxygen Therapy: Patient Spontanous Breathing and Patient connected to nasal cannula oxygen  Post-op Assessment: Report given to PACU RN and Post -op Vital signs reviewed and stable  Post vital signs: Reviewed and stable  Complications: No apparent anesthesia complications

## 2013-08-01 NOTE — H&P (Signed)
Mary [name] is an 68 y.o. female.   Chief Complaint: Zenker's diverticulum, dysphagia HPI: 68 year old female with gradually worsening dysphagia, mostly to solids, and delayed regurgitation of food.  A barium swallow in January demonstrated a 3.1 cm Zenker's diverticulum.  Presents for surgical management.  She also has a history of cough and bronchiectasis.  Past Medical History  Diagnosis Date  . Osteoporosis   . Allergic rhinitis     using Nasocort daily and Allegra daily  . Mycobacterium avium complex   . Bronchiectasis     uses Albuterol daily as needed but has been a while since used(more than a month ago),  . Chronic cough   . Diverticulosis   . Zenker's diverticulum   . PONV (postoperative nausea and vomiting)   . Pneumonia     hx of;in the 90's  . Arthritis   . Joint pain   . GERD (gastroesophageal reflux disease)     takes Prilosec daily  . History of bladder infections     takes Keflex daily;Dr.Ottin is Urologist  . Cataracts, bilateral     immature  . History of MRSA infection     several yrs ago  . Osteoporosis     takes Fosamax weekly    Past Surgical History  Procedure Laterality Date  . Neck surgery  90's    fusion  . Breast biopsy      left  . Esophagogastroduodenoscopy    . Colonoscopy      Family History  Problem Relation Age of Onset  . Allergies Mother   . Heart disease Mother   . Heart disease Father   . Stroke Father    Social History:  reports that she has never smoked. She has never used smokeless tobacco. She reports that she does not drink alcohol or use illicit drugs.  Allergies:  Allergies  Allergen Reactions  . Sulfamethoxazole-Trimethoprim     REACTION: Rash  . Doxycycline Rash    Splotches on skin    Medications Prior to Admission  Medication Sig Dispense Refill  . alendronate (FOSAMAX) 70 MG tablet Take 70 mg by mouth every Saturday. Take with a full glass of water on an empty stomach.      Marland Kitchen. aspirin EC 81 MG  tablet Take 81 mg by mouth daily.      . Calcium-Vitamin D-Vitamin K (VIACTIV PO) Take 2 each by mouth daily.      . fexofenadine (ALLEGRA) 180 MG tablet Take 180 mg by mouth daily.      . Multiple Vitamins-Minerals (ONE-A-DAY 50 PLUS PO) Take 2 tablets by mouth daily.       Marland Kitchen. omeprazole (PRILOSEC) 20 MG capsule Take 20 mg by mouth daily.       Marland Kitchen. triamcinolone (NASACORT AQ) 55 MCG/ACT AERO nasal inhaler Place 2 sprays into the nose daily.      Marland Kitchen. albuterol (PROVENTIL HFA;VENTOLIN HFA) 108 (90 BASE) MCG/ACT inhaler Inhale 2 puffs into the lungs every 4 (four) hours as needed for wheezing or shortness of breath (cough).  1 Inhaler  6  . cephALEXin (KEFLEX) 250 MG capsule Take 250 mg by mouth at bedtime. Scheduled to finish Friday 07/25/13        No results found for this or any previous visit (from the past 48 hour(s)). No results found.  Review of Systems  HENT:       Dysphagia  Respiratory: Positive for cough and hemoptysis.   All other systems reviewed and are  negative.   Blood pressure 148/71, pulse 83, temperature 98.3 F (36.8 C), temperature source Oral, resp. rate 18, height 5' 3.5" (1.613 m), weight 46.267 kg (102 lb), SpO2 96.00%. Physical Exam  Constitutional: She is oriented to person, place, and time. She appears well-developed and well-nourished. No distress.  HENT:  Head: Normocephalic and atraumatic.  Right Ear: External ear normal.  Left Ear: External ear normal.  Nose: Nose normal.  Mouth/Throat: Oropharynx is clear and moist.  Eyes: Conjunctivae and EOM are normal. Pupils are equal, round, and reactive to light.  Neck: Normal range of motion. Neck supple.  Cardiovascular: Normal rate.   Respiratory: Effort normal.  Musculoskeletal: Normal range of motion.  Neurological: She is alert and oriented to person, place, and time. No cranial nerve deficit.  Skin: Skin is warm and dry.  Psychiatric: She has a normal mood and affect. Her behavior is normal. Judgment and  thought content normal.     Assessment/Plan Zenker's diverticulum, dysphagia To OR for endoscopic Zenker's diverticulotomy.  Christia ReadingDwight Esthefany Herrig 08/01/2013, 10:12 AM

## 2013-08-01 NOTE — Brief Op Note (Signed)
08/01/2013  11:05 AM  PATIENT:  Mary ApleySandra M [name]  68 y.o. female  PRE-OPERATIVE DIAGNOSIS:  Zenker's Diverticulum  POST-OPERATIVE DIAGNOSIS:  Zenker's Diverticulum  PROCEDURE:  Procedure(s): ZENKER'S DIVERTICULOTOMY ENDOSCOPIC (N/A)  SURGEON:  Surgeon(s) and Role:    * Christia Readingwight Zalma Channing, MD - Primary  PHYSICIAN ASSISTANT:   ASSISTANTS: none   ANESTHESIA:   general  EBL:     BLOOD ADMINISTERED:none  DRAINS: none   LOCAL MEDICATIONS USED:  NONE  SPECIMEN:  No Specimen  DISPOSITION OF SPECIMEN:  N/A  COUNTS:  YES  TOURNIQUET:  * No tourniquets in log *  DICTATION: .Other Dictation: Dictation Number [id number]  PLAN OF CARE: Admit for overnight observation  PATIENT DISPOSITION:  PACU - hemodynamically stable.   Delay start of Pharmacological VTE agent (>24hrs) due to surgical blood loss or risk of bleeding: no

## 2013-08-01 NOTE — Anesthesia Preprocedure Evaluation (Addendum)
Anesthesia Evaluation  Patient identified by MRN, date of birth, ID band Patient awake    Reviewed: Allergy & Precautions, H&P , NPO status , Patient's Chart, lab work & pertinent test results, reviewed documented beta blocker date and time   History of Anesthesia Complications (+) PONV and history of anesthetic complications  Airway Mallampati: II TM Distance: >3 FB Neck ROM: Full    Dental  (+) Teeth Intact, Dental Advisory Given   Pulmonary neg pneumonia -,  Bronchiectasis, cough.  "Nothing helps" 07-23-13 Chest x-ray IMPRESSION: Findings consistent with chronic obstructive pulmonary disease. Bilateral nodular densities are noted consistent with chronic inflammation or infection as described on prior CT scan. No acute cardiopulmonary abnormality seen.    + rhonchi   Pulmonary exam normal       Cardiovascular Exercise Tolerance: Good - angina- Past MI and - CHF Rhythm:Regular Rate:Normal     Neuro/Psych ACDF 2 levels negative neurological ROS  negative psych ROS   GI/Hepatic Neg liver ROS, GERD-  Medicated and Poorly Controlled,Reflux "getting worse"   Endo/Other  negative endocrine ROS  Renal/GU negative Renal ROS     Musculoskeletal negative musculoskeletal ROS (+)   Abdominal   Peds  Hematology negative hematology ROS (+)   Anesthesia Other Findings Hx cervical fusion  Reproductive/Obstetrics negative OB ROS                      Anesthesia Physical Anesthesia Plan  ASA: III  Anesthesia Plan: General   Post-op Pain Management:    Induction: Intravenous  Airway Management Planned: Oral ETT  Additional Equipment: None  Intra-op Plan:   Post-operative Plan: Extubation in OR  Informed Consent: I have reviewed the patients History and Physical, chart, labs and discussed the procedure including the risks, benefits and alternatives for the proposed anesthesia with the patient  or authorized representative who has indicated his/her understanding and acceptance.   Dental advisory given  Plan Discussed with: CRNA and Surgeon  Anesthesia Plan Comments:        Anesthesia Quick Evaluation

## 2013-08-01 NOTE — Assessment & Plan Note (Signed)
We will watch through the rest of spring pollen season but agreed to increase the interval between shots as we consider whether to stop. Plan-increase allergy vaccine interval to every 2 weeks. Allegra and/or Sudafed.

## 2013-08-01 NOTE — Assessment & Plan Note (Signed)
discussed

## 2013-08-01 NOTE — Progress Notes (Signed)
MD made ware that feeding tube will not be inserted tonight . No new order .

## 2013-08-01 NOTE — Anesthesia Postprocedure Evaluation (Signed)
  Anesthesia Post-op Note  Patient: Mary [name]  Procedure(s) Performed: Procedure(s): ZENKER'S DIVERTICULOTOMY ENDOSCOPIC (N/A)  Patient Location: PACU  Anesthesia Type:General  Level of Consciousness: awake and alert   Airway and Oxygen Therapy: Patient Spontanous Breathing  Post-op Pain: mild  Post-op Assessment: Post-op Vital signs reviewed, Patient's Cardiovascular Status Stable, Respiratory Function Stable, Patent Airway, No signs of Nausea or vomiting and Pain level controlled  Post-op Vital Signs: Reviewed and stable  Last Vitals:  Filed Vitals:   08/01/13 1200  BP: 144/61  Pulse: 64  Temp:   Resp: 18    Complications: No apparent anesthesia complications

## 2013-08-02 ENCOUNTER — Observation Stay (HOSPITAL_COMMUNITY): Payer: Medicare Other

## 2013-08-02 DIAGNOSIS — K225 Diverticulum of esophagus, acquired: Secondary | ICD-10-CM | POA: Diagnosis not present

## 2013-08-02 DIAGNOSIS — Z5189 Encounter for other specified aftercare: Secondary | ICD-10-CM | POA: Diagnosis not present

## 2013-08-02 DIAGNOSIS — E440 Moderate protein-calorie malnutrition: Secondary | ICD-10-CM | POA: Insufficient documentation

## 2013-08-02 LAB — GLUCOSE, CAPILLARY
GLUCOSE-CAPILLARY: 121 mg/dL — AB (ref 70–99)
GLUCOSE-CAPILLARY: 95 mg/dL (ref 70–99)
GLUCOSE-CAPILLARY: 100 mg/dL — AB (ref 70–99)
GLUCOSE-CAPILLARY: 104 mg/dL — AB (ref 70–99)
Glucose-Capillary: 104 mg/dL — ABNORMAL HIGH (ref 70–99)
Glucose-Capillary: 81 mg/dL (ref 70–99)

## 2013-08-02 MED ORDER — IOHEXOL 300 MG/ML  SOLN
50.0000 mL | Freq: Once | INTRAMUSCULAR | Status: AC | PRN
  Administered 2013-08-02: 50 mL via ORAL

## 2013-08-02 NOTE — Procedures (Signed)
Successful fluoro guided placement of a weighted tip enteric tube with tip terminating within the descending duodenum. The enteric tube is ready for immediate use.

## 2013-08-02 NOTE — Op Note (Signed)
NAMCherlynn Perches:  Creasey, Brinna           ACCOUNT NO.:  [id number]  MEDICAL RECORD NO.:  [id number]  LOCATION:  [id number]                        FACILITY:  [facility]  PHYSICIAN:  Antony Contraswight D August Longest, MD     DATE OF BIRTH:  07/11/1945  DATE OF PROCEDURE:  08/01/2013 DATE OF DISCHARGE:                              OPERATIVE REPORT   PREOPERATIVE DIAGNOSES: 1. Zenker diverticulum. 2. Dysphagia.  POSTOPERATIVE DIAGNOSES: 1. Zenker diverticulum. 2. Dysphagia.  PROCEDURE:  Endoscopic Zenker diverticulotomy.  SURGEON:  Antony Contraswight D Hashim Eichhorst, MD  ANESTHESIA:  General endotracheal anesthesia.  COMPLICATIONS:  None.  INDICATION:  The patient is a 68 year old female who has a several year history of worsening dysphagia to maximally the solid foods.  She regurgitates food later after the meal and tastes the meal sometimes an hour later.  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  FINDINGS:  The Zenker diverticulum was identified at the usual spot and has about a 3-cm size.  Exposure was somewhat difficult in getting the common wall exposed within the [facility].  This resulted in a transmucosal tear superior to the diverticulum that was not through the muscle layers.  Ultimately, the common wall was able to be exposed and divided as described below.  DESCRIPTION OF PROCEDURE:  The patient was identified in the holding room and informed consent having been obtained including discussion of risks, benefits, and alternatives, the patient was brought to the operative suite and put on the operating table in supine position. Anesthesia was induced and the patient was intubated by the Anesthesia Team without difficulty.  The eyes taped closed and bed was turned to 90 degrees from anesthesia.  Head wrap was placed around the patient's head and a tooth guard was placed over the upper teeth.  The Weerda pharyngoscope was then  inserted in the mouth and passed into the upper esophagus.  Exposure was difficult, so the pharyngoscope was removed and a cervical esophagoscope was then passed and the Zenker diverticulum was easily identified with food particles within it.  These were suctioned. The Weerda scope was then replaced and manipulated to try to get in place, but again was difficult.  The esophagoscope was again used to evaluate.  The Weerda scope was again placed; with careful manipulation, was able to be placed into the pouch effectively, exposing the common wall between the blades of the pharyngoscope.  This was then placed in suspension on Mayo stand.  The GI stapler was then passed down the [facility] and engaged against the common wall, firing the staple line and dividing the wall.  The staple gun was then taken out and the area examined.  A second discharge with a staple gun was then performed to extend the incision inferiorly.  Again, the staple gun was removed and a 0-degree telescope was used to make a postoperative photograph.  It was used also preoperatively, showing the common wall in the pharyngoscope. The pharyngoscope was then taken out of suspension and removed from the patient's mouth.  The tooth guard was removed and the teeth were in good repair.  The mucosal tear had been noted during one of passings  of the esophagoscope and was able to be avoided by the pharyngoscope on the following passes.  The patient was then returned back to anesthesia for wake up, was extubated, and moved to the recovery room in stable condition.     Antony Contraswight D Britiney Blahnik, MD     DDB/MEDQ  D:  08/01/2013  T:  08/02/2013  Job:  [id number]

## 2013-08-02 NOTE — Progress Notes (Signed)
   ENT Progress Note: POD # 1 s/p Procedure(s): ZENKER'S DIVERTICULOTOMY ENDOSCOPIC   Subjective: Mild ST  Objective: Vital signs in last 24 hours: Temp:  [97.5 F (36.4 C)-98.4 F (36.9 C)] 98.4 F (36.9 C) (05/02 0508) Pulse Rate:  [60-84] 84 (05/02 0508) Resp:  [16-23] 16 (05/02 0508) BP: (104-152)/(51-70) 120/57 mmHg (05/02 0508) SpO2:  [95 %-99 %] 97 % (05/02 0508) Weight:  [50 kg (110 lb 3.7 oz)] 50 kg (110 lb 3.7 oz) (05/02 0508) Weight change:     Intake/Output from previous day: 05/01 0701 - 05/02 0700 In: 2544 [I.V.:2544] Out: 2 [Urine:2] Intake/Output this shift:    Labs: No results found for this basename: WBC, HGB, HCT, PLT,  in the last 72 hours No results found for this basename: NA, K, CL, CO2, GLUCOSE, BUN, CREATININR, CALCIUM,  in the last 72 hours  Studies/Results: Dg Esophagus W/water Sol Cm  08/01/2013   CLINICAL DATA:  Status post Zenker's diverticulotomy. Mucosal tear in the hypopharynx noted intraoperatively.  EXAM: ESOPHOGRAM/BARIUM SWALLOW  TECHNIQUE: Single contrast examination was performed using thin barium or water soluble.  FLUOROSCOPY TIME:  2 min and 25 seconds  COMPARISON:  04/14/2013.  FINDINGS: Immediately following the initial swallow the left-sided Zenker's diverticulum was noted, however, this was clearly smaller than the prior examination and appeared to empty into the esophagus relatively well, compatible with recent diverticulotomy. Above this, extending off the left side of the hypopharynx there was an additional accumulation of oral contrast material which was somewhat irregular in shape with ill-defined margins, compatible with an area of extravasation at a site of mucosal tear. This increased in size significantly when the patient was placed in the left lateral position, and failed to empty in the right lateral position. This finding was best demonstrated on series 36.  IMPRESSION: 1. Findings, as above, compatible with extravasation of  oral contrast through the left side of the hypopharynx presumably from the mucosal tear observed at the time of surgery. These results were called by telephone at the time of interpretation on 08/01/2013 at 3:15 PM to Mary [name] , who verbally acknowledged these results.   Electronically Signed   By: Trudie Reedaniel  Entrikin M.D.   On: 08/01/2013 15:17     PHYSICAL EXAM: Airway and voice are normal No neck swelling or erythema   Assessment/Plan: Stable Plan feeding tube placement under fluoro Cont iv abx Plan repeat swallow on 5/4    Mary [name] 08/02/2013, 9:31 AM

## 2013-08-03 LAB — GLUCOSE, CAPILLARY
GLUCOSE-CAPILLARY: 107 mg/dL — AB (ref 70–99)
Glucose-Capillary: 121 mg/dL — ABNORMAL HIGH (ref 70–99)
Glucose-Capillary: 103 mg/dL — ABNORMAL HIGH (ref 70–99)
Glucose-Capillary: 113 mg/dL — ABNORMAL HIGH (ref 70–99)

## 2013-08-03 MED ORDER — PANTOPRAZOLE SODIUM 40 MG IV SOLR: 40.0000 mg | Freq: Every day | INTRAVENOUS | Status: DC

## 2013-08-03 MED ORDER — KCL IN DEXTROSE-NACL 20-5-0.45 MEQ/L-%-% IV SOLN
INTRAVENOUS | Status: DC
  Administered 2013-08-03: 16:00:00 via INTRAVENOUS
  Filled 2013-08-03: qty 1000

## 2013-08-03 MED ORDER — PANTOPRAZOLE SODIUM 40 MG PO PACK
40.0000 mg | PACK | Freq: Every day | ORAL | Status: DC
  Administered 2013-08-03 – 2013-08-05 (×3): 40 mg
  Filled 2013-08-03 (×4): qty 20

## 2013-08-03 NOTE — Progress Notes (Signed)
ENT Progress Note: POD #2 s/p Procedure(s): ZENKER'S DIVERTICULOTOMY ENDOSCOPIC   Subjective: Mild discomfort  Objective: Vital signs in last 24 hours: Temp:  [97.4 F (36.3 C)-98.5 F (36.9 C)] 98 F (36.7 C) (05/03 0658) Pulse Rate:  [71-74] 72 (05/03 0658) Resp:  [16-17] 16 (05/03 0658) BP: (102-120)/(55-67) 120/55 mmHg (05/03 0658) SpO2:  [94 %-97 %] 94 % (05/03 0658) Weight:  [49.6 kg (109 lb 5.6 oz)] 49.6 kg (109 lb 5.6 oz) (05/03 0658) Weight change: 3.333 kg (7 lb 5.6 oz)    Intake/Output from previous day: 05/02 0701 - 05/03 0700 In: 1381.5 [I.V.:1381.5] Out: -  Intake/Output this shift:    Labs: No results found for this basename: WBC, HGB, HCT, PLT,  in the last 72 hours No results found for this basename: NA, K, CL, CO2, GLUCOSE, BUN, CREATININR, CALCIUM,  in the last 72 hours  Studies/Results: Dg Intro Long Gi Tube  08/02/2013   INDICATION: History of Zenker's diverticulum repair with extravasation at the level the left side of the hypopharynx on upper GI series. Please place feeding enteric tube under fluoroscopic guidance.  EXAM: INTRO LONG GI TUBE  COMPARISON:  DG ESOPHAGUS W/ WATER SOL CM dated 08/01/2013; DG ESOPHAGUS dated 04/14/2013  CONTRAST:  20 cc OMNIPAQUE IOHEXOL 300 MG/ML SOLN administered into the descending duodenum  FLUOROSCOPY TIME:  3 minutes. 12 seconds.  COMPLICATIONS: None immediate  PROCEDURE: The Dobbhoff tube was lubricated with viscous lidocaine inserted into the left nostril. Under intermittent fluoroscopic guidance, the Dobbhoff tube was advanced through the stomach and into the duodenum with tip ultimately within the expected descending portion of the duodenum. Contrast injection confirmed appropriate positioning. A spot fluoroscopic image was saved for documentation purposes.  The tube was affixed to the patient's nose with tape. The patient tolerated the procedure well without immediate postprocedural complication.  FINDINGS: After  fluoroscopic guided placement, the tip of the enteric tube terminates within the descending duodenum.  IMPRESSION: Successful fluoroscopic guided placement of Dobbhoff tube with tip terminating within the descending portion of the duodenum. The tube is ready for immediate use.   Electronically Signed   By: Simonne ComeJohn  Watts M.D.   On: 08/02/2013 13:41   Dg Esophagus W/water Sol Cm  08/01/2013   CLINICAL DATA:  Status post Zenker's diverticulotomy. Mucosal tear in the hypopharynx noted intraoperatively.  EXAM: ESOPHOGRAM/BARIUM SWALLOW  TECHNIQUE: Single contrast examination was performed using thin barium or water soluble.  FLUOROSCOPY TIME:  2 min and 25 seconds  COMPARISON:  04/14/2013.  FINDINGS: Immediately following the initial swallow the left-sided Zenker's diverticulum was noted, however, this was clearly smaller than the prior examination and appeared to empty into the esophagus relatively well, compatible with recent diverticulotomy. Above this, extending off the left side of the hypopharynx there was an additional accumulation of oral contrast material which was somewhat irregular in shape with ill-defined margins, compatible with an area of extravasation at a site of mucosal tear. This increased in size significantly when the patient was placed in the left lateral position, and failed to empty in the right lateral position. This finding was best demonstrated on series 36.  IMPRESSION: 1. Findings, as above, compatible with extravasation of oral contrast through the left side of the hypopharynx presumably from the mucosal tear observed at the time of surgery. These results were called by telephone at the time of interpretation on 08/01/2013 at 3:15 PM to Dr. Christia ReadingWIGHT BATES , who verbally acknowledged these results.   Electronically Signed  By: Trudie Reedaniel  Entrikin M.D.   On: 08/01/2013 15:17     PHYSICAL EXAM: Neck is nontender, no swelling or erythema Airway stable   Assessment/Plan: Stable tol tube  feeding Cont abx, plan repeat swallow for am 5/4.    Osborn Cohoavid Thaddus Mcdowell 08/03/2013, 10:54 AM

## 2013-08-04 ENCOUNTER — Inpatient Hospital Stay (HOSPITAL_COMMUNITY): Payer: Medicare Other

## 2013-08-04 DIAGNOSIS — J690 Pneumonitis due to inhalation of food and vomit: Secondary | ICD-10-CM | POA: Diagnosis not present

## 2013-08-04 LAB — GLUCOSE, CAPILLARY
GLUCOSE-CAPILLARY: 107 mg/dL — AB (ref 70–99)
GLUCOSE-CAPILLARY: 130 mg/dL — AB (ref 70–99)
GLUCOSE-CAPILLARY: 97 mg/dL (ref 70–99)
GLUCOSE-CAPILLARY: 99 mg/dL (ref 70–99)
Glucose-Capillary: 91 mg/dL (ref 70–99)
Glucose-Capillary: 91 mg/dL (ref 70–99)

## 2013-08-04 MED ORDER — IOHEXOL 300 MG/ML  SOLN
150.0000 mL | Freq: Once | INTRAMUSCULAR | Status: AC | PRN
  Administered 2013-08-04: 35 mL via ORAL

## 2013-08-04 NOTE — Progress Notes (Addendum)
3 Days Post-Op  Subjective: Doing well.  Feeding tube is uncomfortable but she is tolerating it.  Pain is improving.  Objective: Vital signs in last 24 hours: Temp:  [97.7 F (36.5 C)-98.3 F (36.8 C)] 97.7 F (36.5 C) (05/04 0537) Pulse Rate:  [73-81] 73 (05/04 0537) Resp:  [16-18] 17 (05/04 0537) BP: (118-140)/(51-66) 118/59 mmHg (05/04 0537) SpO2:  [96 %-98 %] 98 % (05/04 0537) Weight:  [48.675 kg (107 lb 4.9 oz)] 48.675 kg (107 lb 4.9 oz) (05/04 0537) Last BM Date: 08/03/13  Intake/Output from previous day: 05/03 0701 - 05/04 0700 In: 1220.7 [I.V.:50; NG/GT:1070.7; IV Piggyback:100] Out: -  Intake/Output this shift:    General appearance: alert, cooperative and no distress Neck: non-tender with no crepitance  Lab Results:  No results found for this basename: WBC, HGB, HCT, PLT,  in the last 72 hours BMET No results found for this basename: NA, K, CL, CO2, GLUCOSE, BUN, CREATININE, CALCIUM,  in the last 72 hours PT/INR No results found for this basename: LABPROT, INR,  in the last 72 hours ABG No results found for this basename: PHART, PCO2, PO2, HCO3,  in the last 72 hours  Studies/Results: Dg Intro Long Gi Tube  08/02/2013   INDICATION: History of Zenker's diverticulum repair with extravasation at the level the left side of the hypopharynx on upper GI series. Please place feeding enteric tube under fluoroscopic guidance.  EXAM: INTRO LONG GI TUBE  COMPARISON:  DG ESOPHAGUS W/ WATER SOL CM dated 08/01/2013; DG ESOPHAGUS dated 04/14/2013  CONTRAST:  20 cc OMNIPAQUE IOHEXOL 300 MG/ML SOLN administered into the descending duodenum  FLUOROSCOPY TIME:  3 minutes. 12 seconds.  COMPLICATIONS: None immediate  PROCEDURE: The Dobbhoff tube was lubricated with viscous lidocaine inserted into the left nostril. Under intermittent fluoroscopic guidance, the Dobbhoff tube was advanced through the stomach and into the duodenum with tip ultimately within the expected descending portion of the  duodenum. Contrast injection confirmed appropriate positioning. A spot fluoroscopic image was saved for documentation purposes.  The tube was affixed to the patient's nose with tape. The patient tolerated the procedure well without immediate postprocedural complication.  FINDINGS: After fluoroscopic guided placement, the tip of the enteric tube terminates within the descending duodenum.  IMPRESSION: Successful fluoroscopic guided placement of Dobbhoff tube with tip terminating within the descending portion of the duodenum. The tube is ready for immediate use.   Electronically Signed   By: Simonne ComeJohn  Watts M.D.   On: 08/02/2013 13:41    Anti-infectives: Anti-infectives   Start     Dose/Rate Route Frequency Ordered Stop   08/01/13 1600  Ampicillin-Sulbactam (UNASYN) 3 g in sodium chloride 0.9 % 100 mL IVPB     3 g 100 mL/hr over 60 Minutes Intravenous Every 6 hours 08/01/13 1538        Assessment/Plan: s/p Procedure(s): ZENKER'S DIVERTICULOTOMY ENDOSCOPIC (N/A) Hypopharyngeal tear during surgery Tolerating tube feeds.  Continues on Unasyn.  Follow-up gastrograffin swallow this morning.  Likely discharge soon, still not clear whether she will need to continue with tube feeding or not.  Will depend on study results.  LOS: 3 days    Christia ReadingDwight Dmetrius Ambs 08/04/2013  I spoke to the radiologist who completed the swallow study this morning and she felt that the extravasation that occurred apart from the Zenker's area seen on the previous study is no longer present today.  That being said, I will start the patient on clear liquids and stop tube feedings.  I will leave the  feeding tube in place for now but if she does well overnight, will remove the tube in the morning and discharge her on clear liquids.

## 2013-08-05 LAB — CBC WITH DIFFERENTIAL/PLATELET
BASOS ABS: 0 10*3/uL (ref 0.0–0.1)
Basophils Relative: 1 % (ref 0–1)
EOS ABS: 0.1 10*3/uL (ref 0.0–0.7)
EOS PCT: 3 % (ref 0–5)
HCT: 40.8 % (ref 36.0–46.0)
Hemoglobin: 13.6 g/dL (ref 12.0–15.0)
LYMPHS PCT: 27 % (ref 12–46)
Lymphs Abs: 1.5 10*3/uL (ref 0.7–4.0)
MCH: 29.3 pg (ref 26.0–34.0)
MCHC: 33.3 g/dL (ref 30.0–36.0)
MCV: 87.9 fL (ref 78.0–100.0)
Monocytes Absolute: 0.5 10*3/uL (ref 0.1–1.0)
Monocytes Relative: 10 % (ref 3–12)
Neutro Abs: 3.4 10*3/uL (ref 1.7–7.7)
Neutrophils Relative %: 59 % (ref 43–77)
PLATELETS: 250 10*3/uL (ref 150–400)
RBC: 4.64 MIL/uL (ref 3.87–5.11)
RDW: 13 % (ref 11.5–15.5)
WBC: 5.6 10*3/uL (ref 4.0–10.5)

## 2013-08-05 LAB — GLUCOSE, CAPILLARY
Glucose-Capillary: 90 mg/dL (ref 70–99)
Glucose-Capillary: 95 mg/dL (ref 70–99)
Glucose-Capillary: 93 mg/dL (ref 70–99)

## 2013-08-05 MED ORDER — AMOXICILLIN-POT CLAVULANATE 250-62.5 MG/5ML PO SUSR: 500.0000 mg | Freq: Three times a day (TID) | ORAL | Status: DC

## 2013-08-05 NOTE — Discharge Summary (Signed)
Patient given discharge paperwork. Instructed to make follow up appointment asap. Reviewed current medications and prescription with patient. Patient instructed to wash hands to decrease the spread of infection. Community resources  Provided to patient. Patient is prepared for discharge.

## 2013-08-05 NOTE — Discharge Summary (Signed)
Physician Discharge Summary  Patient ID: Mary RoyaltySandra M [name] MRN: [id number] DOB/AGE: 1945-10-30 68 y.o.  Admit date: 08/01/2013 Discharge date: 08/05/2013  Admission Diagnoses: Zenker's diverticulum, dysphagia  Discharge Diagnoses:  Active Problems:   Zenker's diverticulum   Malnutrition of moderate degree Hypopharyngeal perforation  Discharged Condition: good  Hospital Course: 68 year old female with progressive dysphagia found in January to have a 3 cm Zenker's diverticulum.  Presented to OR for endoscopic diverticulotomy.  See operative note.  During surgery, a tear in the left hypopharyngeal wall was noted.  A gastrograffin swallow after surgery highlighted extravasation at that site so she was kept NPO and started on Unasyn.  A feeding tube was placed the next day under fluoroscopy and tube feeding was initiated.  She did well through the weekend with no fever and a repeat gastrograffin swallow study was performed on Monday showing no extravasation.  Tube feedings were stopped and she was given a clear liquid diet.  She was observed one more night with no fever, worsened throat pain, or increase in WBC.  Thus, on discharge day, the feeding tube was removed and she was felt stable for discharge on a liquid diet.  Consults: None  Significant Diagnostic Studies: Gastrograffin swallow x 2  Treatments: surgery: Endoscopic Zenker's diverticulotomy, IV antibiotics, Enteral tube feedings  Discharge Exam: Blood pressure 121/59, pulse 71, temperature 97.9 F (36.6 C), temperature source Oral, resp. rate 18, height 5' 3.5" (1.613 m), weight 44.725 kg (98 lb 9.6 oz), SpO2 96.00%. General appearance: alert, cooperative and no distress Neck: no tenderness or crepitance  Disposition: 01-Home or Self Care  Discharge Orders   Future Appointments Provider Department Dept Phone   12/31/2013 11:00 AM Waymon Budgelinton D Young, MD [facility] [phone]   Future Orders Complete By Expires   Diet  - low sodium heart healthy  As directed    Discharge instructions  As directed    Increase activity slowly  As directed        Medication List    STOP taking these medications       cephALEXin 250 MG capsule  Commonly known as:  KEFLEX      TAKE these medications       albuterol 108 (90 BASE) MCG/ACT inhaler  Commonly known as:  PROVENTIL HFA;VENTOLIN HFA  Inhale 2 puffs into the lungs every 4 (four) hours as needed for wheezing or shortness of breath (cough).     alendronate 70 MG tablet  Commonly known as:  FOSAMAX  Take 70 mg by mouth every Saturday. Take with a full glass of water on an empty stomach.     amoxicillin-clavulanate 250-62.5 MG/5ML suspension  Commonly known as:  AUGMENTIN  Take 10 mLs (500 mg total) by mouth 3 (three) times daily.     aspirin EC 81 MG tablet  Take 81 mg by mouth daily.     fexofenadine 180 MG tablet  Commonly known as:  ALLEGRA  Take 180 mg by mouth daily.     NASACORT AQ 55 MCG/ACT Aero nasal inhaler  Generic drug:  triamcinolone  Place 2 sprays into the nose daily.     omeprazole 20 MG capsule  Commonly known as:  PRILOSEC  Take 20 mg by mouth daily.     ONE-A-DAY 50 PLUS PO  Take 2 tablets by mouth daily.     VIACTIV PO  Take 2 each by mouth daily.           Follow-up Information  Follow up with Jamesrobert Ohanesian, MD. Schedule an appointment as soon as possible for a visit in 2 weeks.   Specialty:  Otolaryngology   Contact information:   [address] [place] [address] [phone]       Signed: Christia ReadingDwight Srinidhi Landers 08/05/2013, 8:25 AM

## 2013-08-13 ENCOUNTER — Ambulatory Visit (INDEPENDENT_AMBULATORY_CARE_PROVIDER_SITE_OTHER): Payer: Medicare Other

## 2013-08-13 DIAGNOSIS — J309 Allergic rhinitis, unspecified: Secondary | ICD-10-CM

## 2013-08-20 ENCOUNTER — Ambulatory Visit: Payer: Medicare Other

## 2013-08-27 ENCOUNTER — Ambulatory Visit (INDEPENDENT_AMBULATORY_CARE_PROVIDER_SITE_OTHER): Payer: Medicare Other

## 2013-08-27 DIAGNOSIS — N3020 Other chronic cystitis without hematuria: Secondary | ICD-10-CM | POA: Diagnosis not present

## 2013-08-27 DIAGNOSIS — J309 Allergic rhinitis, unspecified: Secondary | ICD-10-CM | POA: Diagnosis not present

## 2013-09-03 ENCOUNTER — Ambulatory Visit: Payer: Medicare Other

## 2013-09-10 ENCOUNTER — Encounter: Payer: Self-pay | Admitting: Emergency Medicine

## 2013-09-10 ENCOUNTER — Ambulatory Visit (INDEPENDENT_AMBULATORY_CARE_PROVIDER_SITE_OTHER): Payer: Medicare Other | Admitting: Emergency Medicine

## 2013-09-10 ENCOUNTER — Ambulatory Visit (INDEPENDENT_AMBULATORY_CARE_PROVIDER_SITE_OTHER): Payer: Medicare Other

## 2013-09-10 VITALS — BP 118/66 | HR 78 | Ht 64.0 in | Wt 102.0 lb

## 2013-09-10 DIAGNOSIS — J301 Allergic rhinitis due to pollen: Secondary | ICD-10-CM

## 2013-09-10 DIAGNOSIS — J309 Allergic rhinitis, unspecified: Secondary | ICD-10-CM | POA: Diagnosis not present

## 2013-09-10 DIAGNOSIS — J479 Bronchiectasis, uncomplicated: Secondary | ICD-10-CM

## 2013-09-10 DIAGNOSIS — A310 Pulmonary mycobacterial infection: Secondary | ICD-10-CM | POA: Diagnosis not present

## 2013-09-10 DIAGNOSIS — R05 Cough: Secondary | ICD-10-CM

## 2013-09-10 DIAGNOSIS — R059 Cough, unspecified: Secondary | ICD-10-CM

## 2013-09-10 NOTE — Assessment & Plan Note (Signed)
-   Will follow symptomatically

## 2013-09-10 NOTE — Patient Instructions (Signed)
Please be on the lookout for changes in your cough frequency or your sputum Restart your allegra and nasal steroid this fall or if allergy symptoms worsen Continue allergy shots for now Restart omeprazole if you develop signs of reflux No antibiotics for now.  We will decide on the timing of any further scans of your chest based on your symptoms  Follow with Dr Delton Coombes in 6 months or sooner if you have any problems

## 2013-09-10 NOTE — Assessment & Plan Note (Signed)
She has stopped nasal steroid and Allegra for now. She is still on immunotherapy.  - she will restart her Allegra and nasal steroid this fall - She is on immunotherapy every other week and will follow with Dr. Maple Hudson

## 2013-09-10 NOTE — Progress Notes (Signed)
Patient ID: Mary [name] Mary [name] [name], female    DOB: Apr 19, 1945, Mary [name] y.o.   MRN: [id number] HPI Mary [name] Mary [name] [name] is a Mary [name] year old woman whom I have followed for chronic cough. 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 Evaluated 2011 by Dr Maple Hudson for allergies - shows multiple sensitivities that are present year round.   ROV 07/12/09 -- returns for Mary [name] [name]. Tells me that Mary [name] Mary [name] [name] PND has actually improved since last visit. Mary [name] Mary [name] [name] regimen is benadryl 25mg  two times a day, NSW two times a day, flutter valve two times a day - sometimes productive but often not. Also on rotating doxy + azithromycin. No flares of bronchiectasis since last visit. She is contemplating starting allergy shots, hopefully can get them from Mary [name] Mary [name] [name] PCP.   ROV 10/01/09 -- Mary [name] [name] bronchiectasis, cough. Flare that started about a week ago. Began treatment with Avelox and set up this follow up. Was exposed to husband who had a URI. Changed Mary [name] Mary [name] [name] benadryl to Claritin D. She has not started allergy shots yet. Starting to feel better, not back to baseline. never started Singulair.   ROV 01/24/10 -- Hx chronic cough, bronchiectasis. Tried daliresp, couldn't tolerate due to insomnia. Still with clear drainage, never responded to treatment with nasal steroid. On benadryl qhs. Taking alternating doxy and azithro monthly. Considering starting allergy shots - discussing w Dr Maple Hudson.   ROV 08/05/10 -- bronchiectasis, chronic cough, severe allergic rhinitis. Doing NSW qd and flutter qd.  She is on allergy shots and nasal gtt may be better. She is alternating doxy with azithro at the beginning of every month. Mary [name] Mary [name] [name] cough is predicatble - morning and afternoon. Worst if she has been off abx for an extended period. Last CT scan was 2008.   10/19/10-  Mary [name] Mary [name] [name]  Never smoker followed for bronchiectasis/ hx M.avium, chronic cough, severe allergic rhinitis Started allergy vaccine in January, now at 1:50 with occasional minimal  local soreness but no problems. Says the spring pollen season was "rough"- increased nasal stuffiness and cough. Has been better in last 2 months. Still takes benadryl once each night. Hx of trying many nasal sprays. Still some nasal congestion and drip at times. She is interested in getting Mary [name] Mary [name] [name] shots in [place] at Mary [name] Mary [name] [name] PCP. allergy profile- IgE 2.4, negative common allergens. Negative Fungal antibody panel  01/20/11-  Mary [name] Mary [name] [name]  Never smoker followed for bronchiectasis/ hx M.avium, chronic cough (Dr Delton Coombes), severe allergic rhinitis She returns for followup of Mary [name] Mary [name] [name] allergy vaccine therapy begun in January 2012. She says she had 2 good weeks at the beginning of September but now feels nasal congestion in the morning with watery rhinorrhea later in the day "like a cold". She has not been taking antihistamines very often, but prefers Benadryl. Cough is productive of clear to yellow sputum with no blood or fever. Dr Delton Coombes is alternating antibiotics. Flu shot is pending with Mary [name] Mary [name] [name] primary physician  ROV 05/05/11 -- bronchiectasis, chronic cough, severe allergic rhinitis. Hx MAIC and also MRSA. She has started allergy shots w Dr Maple Hudson. She still has congestion and drainage, assoc w dry cough. She is alternating abx (doxy/azithro) beginning every month. The cough and sputum is bad between abx and she often has to start early. Doxy is better than azithro, but it is taking longer for it to be effective. She is wondering if she might be having some reflux.   ROV 06/21/11 -- bronchiectasis w hx MAIC + MRSA, chronic cough both due to bronchiectasis and to  UA irritation, severe allergic rhinitis. Returns after sputum cx >> shows MAIC, sensitivities not yet available. She continues to have cough that is most productive in the am and at the end of the day.   ROV 08/04/11 -- bronchiectasis w hx MAIC + MRSA, chronic cough both due to bronchiectasis and to UA irritation, severe allergic rhinitis. We started clarithro + ethambutol  [name] started it on 4/15. She has the bitter taste in Mary [name] Mary [name] [name] mouth, had this last time. I had thought this was due to rifabutin, but possibly due to clarithro. Not currently on doxy. Since last time she thinks she had URI, now probably back to baseline.   ROV 11/13/11 -- bronchiectasis w hx MAIC + MRSA, chronic cough both due to bronchiectasis and to UA irritation, severe allergic rhinitis. Has been on clarithro/ethambutol since 07/17/11. She has less energy on the abx. She continues to have cough that is productive of either green or clear. The cough is most bothersome at night, can wake Mary [name] Mary [name] [name] from sleep. She coughs every morning - clears secretions. Then around mid-day she has another bout, then again in the evening.    ROV 01/22/12 -- bronchiectasis w hx MAIC + MRSA, chronic cough both due to bronchiectasis and to UA irritation, severe allergic rhinitis. [facility] April '[facility]. She finished the abx 01/12/12 (6 months). She continues to have cough and UA irritation.  Mary [name] Mary [name] [name] nasal gtt is better but not gone; has never benefited from nasal steroid, astelin. Needs repeat CT scan 08/2012.   07/03/12- Mary [name] Mary [name] [name]  Never smoker followed for bronchiectasis/ hx M.avium, chronic cough (Dr Delton CoombesByrum), severe allergic rhinitis FOLLOWS FOR: sneezing and runny nose; cough and drainage as well. Still on allergy vaccine Missed Mary [name] Mary [name] [name] allergy shots 1:10 GH for month of March after fall with cracked pelvis.  Incvreased sneeze, watery rhinorhea, much post-nasal drip. No headache, ears, purulent. Blames PND for increased cough from Mary [name] Mary [name] [name] bronchiectasis.  Taking a Sudafed-PE product containing an antihistamine, nasal saline irrigation.  ROV 07/24/12 -- bronchiectasis w hx MAIC + MRSA, chronic cough both due to bronchiectasis and to UA irritation, severe allergic rhinitis. She is on immunotherapy per Dr Maple HudsonYoung, doing NSW's. Had never benefited from nasal steroid or astelin in the past. Started dymista  on 4/2 from dr Maple HudsonYoung. Also had Ct scan sinuses > all clear 07/16/12.  She unfortunately fell and broke Mary [name] Mary [name] [name] pelvis in early March. Mary [name] Mary [name] [name] cough continues, still yellow/green/brown. Occasionally some blood from nose or sputum. Breathing stable. Not currently on rotating abx.   ROV 09/25/12 -- bronchiectasis w hx MAIC + MRSA, chronic cough both due to bronchiectasis and to UA irritation, severe allergic rhinitis. We restarted rotating abx after last visit. She notes that sputum is no longer as colored, still thick. Alternating doxy and clarithro.  She has also been on Keflex long term for recurrent UTI's. She is back on allergy shots.   ROV 04/09/13 -- bronchiectasis w hx MAIC + MRSA, chronic cough both due to bronchiectasis and to UA irritation, severe allergic rhinitis. She has followed with Dr Maple HudsonYoung and receiving allergy shots. She has persistent sx, but recently also had a URI which made everything even worse. She has noticed that she got a skin rash associated with Mary [name] Mary [name] [name] doxycycline. She is having some problems swallowing - things getting hung up in he throat, difficulty taking clarithro.   ROV 05/21/13 -- bronchiectasis w hx MAIC + MRSA, chronic cough both due to bronchiectasis and to  UA irritation, severe allergic rhinitis. Last time treated with levaquin, stopped rotating abx. She has continued persistent cough, every day all day. Often not productive. She has seen some blood in sputum for last 2 weeks. Mary [name] Mary [name] [name] barium study showed a zenker's diverticulum, small HH, some dilation. She has at times coughed enough to bring up small food particles, but she distinguishes this from true emesis. She tried albuterol some - mixed results.   ROV 09/10/13 -- Bronchiectasis w hx MAIC + MRSA, chronic cough both due to bronchiectasis and to UA irritation, severe allergic rhinitis. She is on max therapy for allergies including immunotherapy.  She recently underwent zenker's diverticulotomy on 08/01/13 with Dr Jenne Pane. She has improved  remarkably. She does still cough some, less productive and much less frequent. She is off nasacort, omeprazole, allergra right now.    Filed Vitals:   09/10/13 1157  BP: 118/Mary [name]  Pulse: 78  Height: 5\' 4"  (1.626 m)  Weight: 102 lb (46.267 kg)  SpO2: 94%   Gen: Pleasant, well-nourished, in no distress,  normal affect  ENT: No lesions,  mouth clear,  oropharynx clear, no postnasal drip  Neck: No JVD, no TMG, no carotid bruits  Lungs: No use of accessory muscles, no dullness to percussion, clear without rales or rhonchi  Cardiovascular: RRR, heart sounds normal, no murmur or gallops, no peripheral edema  Musculoskeletal: No deformities, no cyanosis or clubbing  Neuro: alert, non focal  Skin: Warm, no lesions or rashes  04/14/13 barium swallow COMPARISON: 09/27/2012  FLUOROSCOPY TIME: 1 min, 4 seconds  FINDINGS:  Aleft eccentric 3.1 x 1.7 x 1.5 cm Zenker's diverticulum is observed  at approximately the C6-7 level of the cervical esophagus. Note is  made of interbody fusion at C5-C6-C7 which appears solid.  Pharyngeal phase of swallowing appears otherwise unremarkable.  Primary peristaltic waves in the esophagus were preserved on 4 out  of 4 swallows. Distal esophageal mucosal relief few unremarkable.  There is a small hiatal hernia. A subtle distal esophageal mucosal  ring is observed, but measuring well over 2 cm in diameter and  accordingly not expected cause symptoms. The could a 13 mm barium  tablet passed without difficulty into the stomach.  IMPRESSION:  1. Left eccentric a Zenker's diverticulum measuring up to 3.1 cm in  long axis along the lower cervical esophagus.  2. Small hiatal hernia.  3. Widely patent distal esophageal mucosal ring, well over 2 cm in  diameter and accordingly not expected to cause symptoms   07/16/12 CT scan sinuses Comparison: 12/24/2006 and earlier.  Findings: Negative visualized noncontrast brain parenchyma, orbit  soft tissues, and face  soft tissues.  Visualized mastoids and tympanic cavities are clear.  Hyperplastic sphenoid sinuses remain clear.  Ethmoid air cells remain clear.  Frontal sinuses remain clear.  Maxillary sinuses remain clear. Both OMCs are patent.  Chronic rightward nasal septal deviation. No acute osseous  abnormality identified.  IMPRESSION:  Stable and clear paranasal sinuses.  CT chest 10/03/12 --  Comparison: Chest CTs dated 08/02/2006 and 08/10/2010.  Findings: The mediastinum has a stable appearance with mild  prominence of the ascending aorta, measuring 3.2 cm transverse.  Small mediastinal lymph nodes are unchanged. There are no  pathologically enlarged mediastinal, hilar or axillary lymph nodes.  There is no pleural or pericardial effusion.  The lungs have a fairly stable appearance. There is diffuse  bronchiectasis which is most advanced within the middle lobe,  lingula and both lower lobes. There is associated bronchial wall  thickening and scattered peribronchial ground-glass nodularity.  The nodularity is fluctuating, although there is no dominant mass  or confluent airspace opacity. Expiration imaging demonstrates no  significant air trapping.  The visualized upper abdomen has a stable appearance. There are no  worrisome osseous findings.  IMPRESSION:  Little change in appearance of the lungs with diffuse  bronchiectasis and peribronchial nodularity consistent with chronic  infection. Again this pattern can be seen with atypical  mycobacterial infection. No dominant mass or focal airspace  disease demonstrated   Pulmonary Mycobacterium avium complex (MAC) infection Bronchiectasis and Mycobacterium avium appear to be stable at this time.  - No indication for a repeat CT scan for now;  we will decide the timing of this based on Mary [name] Mary [name] [name] symptoms - No antibiotics at this time  BRONCHIECTASIS - Will follow symptomatically  COUGH, CHRONIC Significantly improved since Mary [name] Mary [name] [name] Zenker's  diverticulectomy.   Allergic rhinitis due to pollen She has stopped nasal steroid and Allegra for now. She is still on immunotherapy.  - she will restart Mary [name] Mary [name] [name] Allegra and nasal steroid this fall - She is on immunotherapy every other week and will follow with Dr. Maple HudsonYoung

## 2013-09-10 NOTE — Assessment & Plan Note (Signed)
Bronchiectasis and Mycobacterium avium appear to be stable at this time.  - No indication for a repeat CT scan for now;  we will decide the timing of this based on her symptoms - No antibiotics at this time

## 2013-09-10 NOTE — Assessment & Plan Note (Signed)
Significantly improved since her Zenker's diverticulectomy.

## 2013-09-17 DIAGNOSIS — N3020 Other chronic cystitis without hematuria: Secondary | ICD-10-CM | POA: Diagnosis not present

## 2013-09-24 ENCOUNTER — Ambulatory Visit (INDEPENDENT_AMBULATORY_CARE_PROVIDER_SITE_OTHER): Payer: Medicare Other

## 2013-09-24 DIAGNOSIS — J309 Allergic rhinitis, unspecified: Secondary | ICD-10-CM

## 2013-09-29 ENCOUNTER — Other Ambulatory Visit: Payer: Self-pay | Admitting: Family Medicine

## 2013-10-08 ENCOUNTER — Ambulatory Visit (INDEPENDENT_AMBULATORY_CARE_PROVIDER_SITE_OTHER): Payer: Medicare Other

## 2013-10-08 DIAGNOSIS — J309 Allergic rhinitis, unspecified: Secondary | ICD-10-CM

## 2013-10-15 ENCOUNTER — Ambulatory Visit: Payer: Medicare Other

## 2013-10-22 ENCOUNTER — Ambulatory Visit (INDEPENDENT_AMBULATORY_CARE_PROVIDER_SITE_OTHER): Payer: Medicare Other

## 2013-10-22 DIAGNOSIS — J309 Allergic rhinitis, unspecified: Secondary | ICD-10-CM | POA: Diagnosis not present

## 2013-11-05 ENCOUNTER — Ambulatory Visit (INDEPENDENT_AMBULATORY_CARE_PROVIDER_SITE_OTHER): Payer: Medicare Other

## 2013-11-05 DIAGNOSIS — J309 Allergic rhinitis, unspecified: Secondary | ICD-10-CM

## 2013-11-12 ENCOUNTER — Ambulatory Visit: Payer: Medicare Other

## 2013-11-19 ENCOUNTER — Ambulatory Visit (INDEPENDENT_AMBULATORY_CARE_PROVIDER_SITE_OTHER): Payer: Medicare Other

## 2013-11-19 DIAGNOSIS — J309 Allergic rhinitis, unspecified: Secondary | ICD-10-CM

## 2013-11-21 ENCOUNTER — Telehealth: Payer: Self-pay | Admitting: Emergency Medicine

## 2013-11-21 NOTE — Telephone Encounter (Signed)
Message not needed. °

## 2013-12-03 ENCOUNTER — Encounter: Payer: Self-pay | Admitting: Emergency Medicine

## 2013-12-03 ENCOUNTER — Ambulatory Visit (INDEPENDENT_AMBULATORY_CARE_PROVIDER_SITE_OTHER): Payer: Medicare Other

## 2013-12-03 ENCOUNTER — Ambulatory Visit (INDEPENDENT_AMBULATORY_CARE_PROVIDER_SITE_OTHER): Payer: Medicare Other | Admitting: Emergency Medicine

## 2013-12-03 VITALS — BP 106/58 | HR 82 | Ht 64.5 in | Wt 103.2 lb

## 2013-12-03 DIAGNOSIS — J309 Allergic rhinitis, unspecified: Secondary | ICD-10-CM

## 2013-12-03 DIAGNOSIS — J471 Bronchiectasis with (acute) exacerbation: Secondary | ICD-10-CM

## 2013-12-03 MED ORDER — LEVOFLOXACIN 500 MG PO TABS: 500.0000 mg | ORAL_TABLET | Freq: Every day | ORAL | Status: DC

## 2013-12-03 NOTE — Progress Notes (Signed)
Patient ID: Mary [name], female    DOB: 1945-12-19, 68 y.o.   MRN: [id number] HPI [name] is a 68 year old woman whom I have followed for chronic cough. 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 Evaluated 2011 by Dr Maple HudsonYoung for allergies - shows multiple sensitivities that are present year round.   ROV 07/12/09 -- returns for f/u. Tells me that her PND has actually improved since last visit. Her regimen is benadryl 25mg  two times a day, NSW two times a day, flutter valve two times a day - sometimes productive but often not. Also on rotating doxy + azithromycin. No flares of bronchiectasis since last visit. She is contemplating starting allergy shots, hopefully can get them from her PCP.   ROV 10/01/09 -- f/u bronchiectasis, cough. Flare that started about a week ago. Began treatment with Avelox and set up this follow up. Was exposed to husband who had a URI. Changed her benadryl to Claritin D. She has not started allergy shots yet. Starting to feel better, not back to baseline. never started Singulair.   ROV 01/24/10 -- Hx chronic cough, bronchiectasis. Tried daliresp, couldn't tolerate due to insomnia. Still with clear drainage, never responded to treatment with nasal steroid. On benadryl qhs. Taking alternating doxy and azithro monthly. Considering starting allergy shots - discussing w Dr Maple HudsonYoung.   ROV 08/05/10 -- bronchiectasis, chronic cough, severe allergic rhinitis. Doing NSW qd and flutter qd.  She is on allergy shots and nasal gtt may be better. She is alternating doxy with azithro at the beginning of every month. Her cough is predicatble - morning and afternoon. Worst if she has been off abx for an extended period. Last CT scan was 2008.   10/19/10-  68 yo F  Never smoker followed for bronchiectasis/ hx M.avium, chronic cough, severe allergic rhinitis Started allergy vaccine in January, now at 1:50 with occasional minimal  local soreness but no problems. Says the spring pollen season was "rough"- increased nasal stuffiness and cough. Has been better in last 2 months. Still takes benadryl once each night. Hx of trying many nasal sprays. Still some nasal congestion and drip at times. She is interested in getting her shots in [place] at her PCP. allergy profile- IgE 2.4, negative common allergens. Negative Fungal antibody panel  01/20/11-  68 yo F  Never smoker followed for bronchiectasis/ hx M.avium, chronic cough (Dr Delton CoombesByrum), severe allergic rhinitis She returns for followup of her allergy vaccine therapy begun in January 2012. She says she had 2 good weeks at the beginning of September but now feels nasal congestion in the morning with watery rhinorrhea later in the day "like a cold". She has not been taking antihistamines very often, but prefers Benadryl. Cough is productive of clear to yellow sputum with no blood or fever. Dr Delton CoombesByrum is alternating antibiotics. Flu shot is pending with her primary physician  ROV 05/05/11 -- bronchiectasis, chronic cough, severe allergic rhinitis. Hx MAIC and also MRSA. She has started allergy shots w Dr Maple HudsonYoung. She still has congestion and drainage, assoc w dry cough. She is alternating abx (doxy/azithro) beginning every month. The cough and sputum is bad between abx and she often has to start early. Doxy is better than azithro, but it is taking longer for it to be effective. She is wondering if she might be having some reflux.   ROV 06/21/11 -- bronchiectasis w hx MAIC + MRSA, chronic cough both due to bronchiectasis and to  UA irritation, severe allergic rhinitis. Returns after sputum cx >> shows MAIC, sensitivities not yet available. She continues to have cough that is most productive in the am and at the end of the day.   ROV 08/04/11 -- bronchiectasis w hx MAIC + MRSA, chronic cough both due to bronchiectasis and to UA irritation, severe allergic rhinitis. We [facility] clarithro [facility]  [facility] started it on 4/15. She has the bitter taste in her mouth, had this last time. I had thought this was due to rifabutin, but possibly due to clarithro. Not currently on doxy. Since last time she thinks she had URI, now probably back to baseline.   ROV 11/13/11 -- bronchiectasis w hx MAIC + MRSA, chronic cough both due to bronchiectasis and to UA irritation, severe allergic rhinitis. Has been on clarithro/ethambutol since 07/17/11. She has less energy on the abx. She continues to have cough that is productive of either green or clear. The cough is most bothersome at night, can wake her from sleep. She coughs every morning - clears secretions. Then around mid-day she has another bout, then again in the evening.    ROV 01/22/12 -- bronchiectasis w hx MAIC + MRSA, chronic cough both due to bronchiectasis and to UA irritation, severe allergic rhinitis. [facility] '[facility]. She finished the abx 01/12/12 (6 months). She continues to have cough and UA irritation.  Her nasal gtt is better but not gone; has never benefited from nasal steroid, astelin. Needs repeat CT scan 08/2012.   07/03/12- 68 yo F  Never smoker followed for bronchiectasis/ hx M.avium, chronic cough (Dr Delton CoombesByrum), severe allergic rhinitis FOLLOWS FOR: sneezing and runny nose; cough and drainage as well. Still on allergy vaccine Missed her allergy shots 1:10 GH for month of March after fall with cracked pelvis.  Incvreased sneeze, watery rhinorhea, much post-nasal drip. No headache, ears, purulent. Blames PND for increased cough from her bronchiectasis.  Taking a Sudafed-PE product containing an antihistamine, nasal saline irrigation.  ROV 07/24/12 -- bronchiectasis w hx MAIC + MRSA, chronic cough both due to bronchiectasis and to UA irritation, severe allergic rhinitis. She is on immunotherapy per Dr Maple HudsonYoung, doing NSW's. Had never benefited from nasal steroid or astelin in the past. Started dymista  on 4/2 from dr Maple HudsonYoung. Also had Ct scan sinuses > all clear 07/16/12.  She unfortunately fell and broke her pelvis in early March. Her cough continues, still yellow/green/brown. Occasionally some blood from nose or sputum. Breathing stable. Not currently on rotating abx.   ROV 09/25/12 -- bronchiectasis w hx MAIC + MRSA, chronic cough both due to bronchiectasis and to UA irritation, severe allergic rhinitis. We restarted rotating abx after last visit. She notes that sputum is no longer as colored, still thick. Alternating doxy and clarithro.  She has also been on Keflex long term for recurrent UTI's. She is back on allergy shots.   ROV 04/09/13 -- bronchiectasis w hx MAIC + MRSA, chronic cough both due to bronchiectasis and to UA irritation, severe allergic rhinitis. She has followed with Dr Maple HudsonYoung and receiving allergy shots. She has persistent sx, but recently also had a URI which made everything even worse. She has noticed that she got a skin rash associated with her doxycycline. She is having some problems swallowing - things getting hung up in he throat, difficulty taking clarithro.   ROV 05/21/13 -- bronchiectasis w hx MAIC + MRSA, chronic cough both due to bronchiectasis and to  UA irritation, severe allergic rhinitis. Last time treated with levaquin, stopped rotating abx. She has continued persistent cough, every day all day. Often not productive. She has seen some blood in sputum for last 2 weeks. Her barium study showed a zenker's diverticulum, small HH, some dilation. She has at times coughed enough to bring up small food particles, but she distinguishes this from true emesis. She tried albuterol some - mixed results.   ROV 09/10/13 -- Bronchiectasis w hx MAIC + MRSA, chronic cough both due to bronchiectasis and to UA irritation, severe allergic rhinitis. She is on max therapy for allergies including immunotherapy.  She recently underwent zenker's diverticulotomy on 08/01/13 with Dr Jenne Pane. She has improved  remarkably. She does still cough some, less productive and much less frequent. She is off nasacort, omeprazole, allergra right now.   ROV 12/03/13 -- Bronchiectasis w hx MAIC + MRSA, chronic cough both due to bronchiectasis and to UA irritation, severe allergic rhinitis.  She has been coughing more and seeing darker mucous, often with blood. She is not on abx right now.      Filed Vitals:   12/03/13 1533  BP: 106/58  Pulse: 82  Height: 5' 4.5" (1.638 m)  Weight: 103 lb 3.2 oz (46.811 kg)  SpO2: 94%   Gen: Pleasant, well-nourished, in no distress,  normal affect  ENT: No lesions,  mouth clear,  oropharynx clear, no postnasal drip  Neck: No JVD, no TMG, no carotid bruits  Lungs: No use of accessory muscles, no dullness to percussion, clear without rales or rhonchi  Cardiovascular: RRR, heart sounds normal, no murmur or gallops, no peripheral edema  Musculoskeletal: No deformities, no cyanosis or clubbing  Neuro: alert, non focal  Skin: Warm, no lesions or rashes  04/14/13 barium swallow COMPARISON: 09/27/2012  FLUOROSCOPY TIME: 1 min, 4 seconds  FINDINGS:  Aleft eccentric 3.1 x 1.7 x 1.5 cm Zenker's diverticulum is observed  at approximately the C6-7 level of the cervical esophagus. Note is  made of interbody fusion at C5-C6-C7 which appears solid.  Pharyngeal phase of swallowing appears otherwise unremarkable.  Primary peristaltic waves in the esophagus were preserved on 4 out  of 4 swallows. Distal esophageal mucosal relief few unremarkable.  There is a small hiatal hernia. A subtle distal esophageal mucosal  ring is observed, but measuring well over 2 cm in diameter and  accordingly not expected cause symptoms. The could a 13 mm barium  tablet passed without difficulty into the stomach.  IMPRESSION:  1. Left eccentric a Zenker's diverticulum measuring up to 3.1 cm in  long axis along the lower cervical esophagus.  2. Small hiatal hernia.  3. Widely patent distal  esophageal mucosal ring, well over 2 cm in  diameter and accordingly not expected to cause symptoms   07/16/12 CT scan sinuses Comparison: 12/24/2006 and earlier.  Findings: Negative visualized noncontrast brain parenchyma, orbit  soft tissues, and face soft tissues.  Visualized mastoids and tympanic cavities are clear.  Hyperplastic sphenoid sinuses remain clear.  Ethmoid air cells remain clear.  Frontal sinuses remain clear.  Maxillary sinuses remain clear. Both OMCs are patent.  Chronic rightward nasal septal deviation. No acute osseous  abnormality identified.  IMPRESSION:  Stable and clear paranasal sinuses.  CT chest 10/03/12 --  Comparison: Chest CTs dated 08/02/2006 and 08/10/2010.  Findings: The mediastinum has a stable appearance with mild  prominence of the ascending aorta, measuring 3.2 cm transverse.  Small mediastinal lymph nodes are unchanged. There are no  pathologically enlarged mediastinal, hilar or axillary lymph nodes.  There is no pleural or pericardial effusion.  The lungs have a fairly stable appearance. There is diffuse  bronchiectasis which is most advanced within the middle lobe,  lingula and both lower lobes. There is associated bronchial wall  thickening and scattered peribronchial ground-glass nodularity.  The nodularity is fluctuating, although there is no dominant mass  or confluent airspace opacity. Expiration imaging demonstrates no  significant air trapping.  The visualized upper abdomen has a stable appearance. There are no  worrisome osseous findings.  IMPRESSION:  Little change in appearance of the lungs with diffuse  bronchiectasis and peribronchial nodularity consistent with chronic  infection. Again this pattern can be seen with atypical  mycobacterial infection. No dominant mass or focal airspace  disease demonstrated   BRONCHIECTASIS With an acute flare. She is colonized with MRSA and with [facility]. We will treat with Levaquin to cover  standard organisms. If she does not improve then we will need to perform a CT scan of the chest and consider restarting therapy for [facility]

## 2013-12-03 NOTE — Assessment & Plan Note (Signed)
With an acute flare. She is colonized with MRSA and with [facility]. We will treat with Levaquin to cover standard organisms. If she does not improve then we will need to perform a CT scan of the chest and consider restarting therapy for [facility]

## 2013-12-03 NOTE — Patient Instructions (Signed)
Start levaquin  daily for the next 10 days.  Continue your other medications as you are taking them  Call our office after your antibiotics to report on whether you are improved. If not then I would like to perform a CT scan of your chest and we will review together afterwards.  Follow with Dr Delton Coombes in 3 months or sooner if you have any problems.

## 2013-12-15 ENCOUNTER — Telehealth: Payer: Self-pay | Admitting: Internal Medicine

## 2013-12-15 NOTE — Telephone Encounter (Signed)
Called and spoke with pt and she stated that she was seen by RB on 9/2 and was given levaquin.  She was advised to call to give an update.  She stated that she finished the levaquin and is much better and will not need anything further at this time, unless she has another flare.  Will forward to RB to make him aware.

## 2013-12-16 NOTE — Telephone Encounter (Signed)
Thank you :)

## 2013-12-17 ENCOUNTER — Ambulatory Visit (INDEPENDENT_AMBULATORY_CARE_PROVIDER_SITE_OTHER): Payer: Medicare Other

## 2013-12-17 DIAGNOSIS — J309 Allergic rhinitis, unspecified: Secondary | ICD-10-CM

## 2013-12-31 ENCOUNTER — Ambulatory Visit: Payer: Medicare Other | Admitting: Internal Medicine

## 2013-12-31 ENCOUNTER — Encounter: Payer: Self-pay | Admitting: Internal Medicine

## 2013-12-31 ENCOUNTER — Ambulatory Visit (INDEPENDENT_AMBULATORY_CARE_PROVIDER_SITE_OTHER): Payer: Medicare Other

## 2013-12-31 VITALS — BP 116/70 | HR 67 | Ht 64.0 in | Wt 102.6 lb

## 2013-12-31 DIAGNOSIS — J309 Allergic rhinitis, unspecified: Secondary | ICD-10-CM

## 2013-12-31 DIAGNOSIS — J310 Chronic rhinitis: Secondary | ICD-10-CM

## 2013-12-31 MED ORDER — IPRATROPIUM BROMIDE 0.03 % NA SOLN: NASAL | Status: DC

## 2013-12-31 NOTE — Progress Notes (Signed)
Patient ID: Mary [name], female    DOB: 11/13/1945, 68 y.o.   MRN: [id number] HPI Mary [name] is a 68 year old woman whom I have followed for chronic cough. 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 Evaluated 2011 by Dr Mary [name] for allergies - shows multiple sensitivities that are present year round.   ROV 07/12/09 -- returns for f/u. Tells me that her PND has actually improved since last visit. Her regimen is benadryl  two times a day, NSW two times a day, flutter valve two times a day - sometimes productive but often not. Also on rotating doxy + azithromycin. No flares of bronchiectasis since last visit. She is contemplating starting allergy shots, hopefully can get them from her PCP.   ROV 10/01/09 -- f/u bronchiectasis, cough. Flare that started about a week ago. Began treatment with Avelox and set up this follow up. Was exposed to husband who had a URI. Changed her benadryl to Claritin D. She has not started allergy shots yet. Starting to feel better, not back to baseline. never started Singulair.   ROV 01/24/10 -- Hx chronic cough, bronchiectasis. Tried daliresp, couldn't tolerate due to insomnia. Still with clear drainage, never responded to treatment with nasal steroid. On benadryl qhs. Taking alternating doxy and azithro monthly. Considering starting allergy shots - discussing w Dr Mary [name].   ROV 08/05/10 -- bronchiectasis, chronic cough, severe allergic rhinitis. Doing NSW qd and flutter qd.  She is on allergy shots and nasal gtt may be better. She is alternating doxy with azithro at the beginning of every month. Her cough is predicatble - morning and afternoon. Worst if she has been off abx for an extended period. Last CT scan was 2008.   10/19/10-  30 yo F  Never smoker followed for bronchiectasis/ hx M.avium, chronic cough, severe allergic rhinitis Started allergy vaccine in January, now at 1:50 with occasional minimal  local soreness but no problems. Says the spring pollen season was "rough"- increased nasal stuffiness and cough. Has been better in last 2 months. Still takes benadryl once each night. Hx of trying many nasal sprays. Still some nasal congestion and drip at times. She is interested in getting her shots in [name] at her PCP. allergy profile- IgE 2.4, negative common allergens. Negative Fungal antibody panel  01/20/11-  38 yo F  Never smoker followed for bronchiectasis/ hx M.avium, chronic cough (Dr Mary [name]), severe allergic rhinitis She returns for followup of her allergy vaccine therapy begun in January 2012. She says she had 2 good weeks at the beginning of September but now feels nasal congestion in the morning with watery rhinorrhea later in the day "like a cold". She has not been taking antihistamines very often, but prefers Benadryl. Cough is productive of clear to yellow sputum with no blood or fever. Dr Mary [name] is alternating antibiotics. Flu shot is pending with her primary physician  ROV 05/05/11 -- bronchiectasis, chronic cough, severe allergic rhinitis. Hx MAIC and also MRSA. She has started allergy shots w Dr Mary [name]. She still has congestion and drainage, assoc w dry cough. She is alternating abx (doxy/azithro) beginning every month. The cough and sputum is bad between abx and she often has to start early. Doxy is better than azithro, but it is taking longer for it to be effective. She is wondering if she might be having some reflux.   ROV 06/21/11 -- bronchiectasis w hx MAIC + MRSA, chronic cough both due to bronchiectasis and to  UA irritation, severe allergic rhinitis. Returns after sputum cx >> shows MAIC, sensitivities not yet available. She continues to have cough that is most productive in the am and at the end of the day.   ROV 08/04/11 -- bronchiectasis w hx MAIC + MRSA, chronic cough both due to bronchiectasis and to UA irritation, severe allergic rhinitis. We started clarithro + ethambutol  for her [facility], started it on 4/15. She has the bitter taste in her mouth, had this last time. I had thought this was due to rifabutin, but possibly due to clarithro. Not currently on doxy. Since last time she thinks she had URI, now probably back to baseline.   ROV 11/13/11 -- bronchiectasis w hx MAIC + MRSA, chronic cough both due to bronchiectasis and to UA irritation, severe allergic rhinitis. Has been on clarithro/ethambutol since 07/17/11. She has less energy on the abx. She continues to have cough that is productive of either green or clear. The cough is most bothersome at night, can wake her from sleep. She coughs every morning - clears secretions. Then around mid-day she has another bout, then again in the evening.    ROV 01/22/12 -- bronchiectasis w hx MAIC + MRSA, chronic cough both due to bronchiectasis and to UA irritation, severe allergic rhinitis. We treated [facility] with erythromycin and ethambutol since April '13. She finished the abx 01/12/12 (6 months). She continues to have cough and UA irritation.  Her nasal gtt is better but not gone; has never benefited from nasal steroid, astelin. Needs repeat CT scan 08/2012.   07/03/12- 65 yo F  Never smoker followed for bronchiectasis/ hx M.avium, chronic cough (Dr Mary [name]), severe allergic rhinitis FOLLOWS FOR: sneezing and runny nose; cough and drainage as well. Still on allergy vaccine Missed her allergy shots 1:10 GH for month of March after fall with cracked pelvis.  Incvreased sneeze, watery rhinorhea, much post-nasal drip. No headache, ears, purulent. Blames PND for increased cough from her bronchiectasis.  Taking a Sudafed-PE product containing an antihistamine, nasal saline irrigation.  ROV 07/24/12 -- bronchiectasis w hx MAIC + MRSA, chronic cough both due to bronchiectasis and to UA irritation, severe allergic rhinitis. She is on immunotherapy per Dr Mary [name], doing NSW's. Had never benefited from nasal steroid or astelin in the past. Started dymista  on 4/2 from dr Mary [name]. Also had Ct scan sinuses > all clear 07/16/12.  She unfortunately fell and broke her pelvis in early March. Her cough continues, still yellow/green/brown. Occasionally some blood from nose or sputum. Breathing stable. Not currently on rotating abx.   ROV 09/25/12 -- bronchiectasis w hx MAIC + MRSA, chronic cough both due to bronchiectasis and to UA irritation, severe allergic rhinitis. We restarted rotating abx after last visit. She notes that sputum is no longer as colored, still thick. Alternating doxy and clarithro.  She has also been on Keflex long term for recurrent UTI's. Sh eis back on allergy shots.   910/1/14-  71 yo F  Never smoker followed for bronchiectasis/ hx M.avium, chronic cough (Dr Mary [name]), severe allergic/ vasomotor rhinitis ( Dr Mary [name]) 07/16/12 CT scan sinuses Comparison: 12/24/2006 and earlier.  Findings: Negative visualized noncontrast brain parenchyma, orbit  soft tissues, and face soft tissues.  Visualized mastoids and tympanic cavities are clear.  Hyperplastic sphenoid sinuses remain clear.  Ethmoid air cells remain clear.  Frontal sinuses remain clear.  Maxillary sinuses remain clear. Both OMCs are patent.  Chronic rightward nasal septal deviation. No acute osseous  abnormality identified.  IMPRESSION:  Stable and clear paranasal sinuses. BRONCHIECTASIS - continue allergy regimen - continue inhaled meds - repeat CT scan chest to compare w priors, will call her to discuss results - rov 6 mon  01/01/13- 68 yo F  Never smoker followed for bronchiectasis/ hx M.avium, chronic cough (Dr Mary CoombesByrum), severe allergic/ vasomotor rhinitis ( Dr Mary HudsonYoung) FOLLOWS FOR:  Symptoms unchanged since last OV.  Still with cough, runny nose and sneezing Get flu vaccine at PCP. Continues Allergy vaccine 1:10 GH Perennial cough sneeze and drainage. Failed steroid nasal spray. Doesn't recall either Dymista or ipratropium.  07/02/13- 68 yo F  Never smoker followed for  bronchiectasis/ hx M.avium, chronic cough (Dr Mary CoombesByrum), severe allergic/ vasomotor rhinitis ( Dr Mary HudsonYoung) FOLLOWS FOR:  Allergy Vaccine 1:10 GH doing well.  Still having symptoms of PND, sinus pressure and increased sneezing She is having some cough and nasal discharge now in the middle of peak spring pollen. Not clear how important allergy vaccine is currently. We discussed option to stop. Cough productive of thick mucus is unchanged. She continues treatment for Mycobacterium avium. She is not sure how much of her cough is due to postnasal drip.  12/31/13- 68 yo F  Never smoker followed for bronchiectasis/ hx M.avium, chronic cough (Dr Mary CoombesByrum), severe allergic/ vasomotor rhinitis ( Dr Mary HudsonYoung), complicated by GERD/ aspiration/repair Zencker's Allergy Vaccine 1:10 GH   FOLLOWS FOR: still on vaccine and doing well.  Silent aspiration was identified during w/u for Zenker's divertic in Spring, 2015.Marland Kitchen.     ROS-see HPI Constitutional:   No-   weight loss, night sweats, fevers, chills, fatigue, lassitude. HEENT:   No-  headaches, difficulty swallowing, tooth/dental problems, sore throat,       + sneezing, No-itching, ear ache, +nasal congestion, +post nasal drip,  CV:  No-   chest pain, orthopnea, PND, swelling in lower extremities, anasarca, dizziness, palpitations Resp: No-   shortness of breath with exertion or at rest.             +productive cough,  + non-productive cough,  No- coughing up of blood.              No-   change in color of mucus.  No- wheezing.   Skin: No-   rash or lesions. GI:  No-   heartburn, indigestion, abdominal pain, nausea, vomiting, GU:  MS:  No-   joint pain or swelling. Neuro-     nothing unusual Psych:  No- change in mood or affect. No depression or anxiety.  No memory loss.  OBJ- Physical Exam General- Alert, Oriented, Affect-appropriate, Distress- none acute Skin- rash-none, lesions- none, excoriation- none Lymphadenopathy- none Head- atraumatic            Eyes-  Gross vision intact, PERRLA, conjunctivae and secretions clear            Ears- Hearing, canals-normal            Nose- +watery, + R Septal dev, polyps, erosion, perforation             Throat- Mallampati II , mucosa clear , drainage- none, tonsils- atrophic Neck- flexible , trachea midline, no stridor , thyroid nl, carotid no bruit Chest - symmetrical excursion , unlabored           Heart/CV- RRR , no murmur , no gallop  , no rub, nl s1 s2                           -  JVD- none , edema- none, stasis changes- none, varices- none           Lung- + minimal crackles, wheeze- none, cough- none , dullness-none, rub- none           Chest wall-  Abd-  Br/ Gen/ Rectal- Not done, not indicated Extrem- cyanosis- none, clubbing, none, atrophy- none, strength- nl Neuro- grossly intact to observation

## 2013-12-31 NOTE — Patient Instructions (Addendum)
Don't forget that flu shot!  Script for ipratropium nasal spray- see if it helps the nasal drip  Please call as needed

## 2014-01-05 ENCOUNTER — Other Ambulatory Visit: Payer: Self-pay | Admitting: Obstetrics & Gynecology

## 2014-01-05 DIAGNOSIS — Z1231 Encounter for screening mammogram for malignant neoplasm of breast: Secondary | ICD-10-CM | POA: Diagnosis not present

## 2014-01-05 DIAGNOSIS — Z124 Encounter for screening for malignant neoplasm of cervix: Secondary | ICD-10-CM | POA: Diagnosis not present

## 2014-01-06 LAB — CYTOLOGY - PAP

## 2014-01-07 ENCOUNTER — Encounter: Payer: Self-pay | Admitting: Internal Medicine

## 2014-01-14 ENCOUNTER — Ambulatory Visit: Payer: Medicare Other

## 2014-01-16 ENCOUNTER — Other Ambulatory Visit: Payer: Self-pay

## 2014-01-21 ENCOUNTER — Ambulatory Visit (INDEPENDENT_AMBULATORY_CARE_PROVIDER_SITE_OTHER): Payer: Medicare Other

## 2014-01-21 DIAGNOSIS — J309 Allergic rhinitis, unspecified: Secondary | ICD-10-CM | POA: Diagnosis not present

## 2014-01-26 ENCOUNTER — Ambulatory Visit (INDEPENDENT_AMBULATORY_CARE_PROVIDER_SITE_OTHER): Payer: Medicare Other | Admitting: Family Medicine

## 2014-01-26 ENCOUNTER — Encounter: Payer: Self-pay | Admitting: Family Medicine

## 2014-01-26 VITALS — BP 112/70 | Ht 64.0 in | Wt 104.0 lb

## 2014-01-26 DIAGNOSIS — Z79899 Other long term (current) drug therapy: Secondary | ICD-10-CM | POA: Diagnosis not present

## 2014-01-26 DIAGNOSIS — G608 Other hereditary and idiopathic neuropathies: Secondary | ICD-10-CM

## 2014-01-26 DIAGNOSIS — Z23 Encounter for immunization: Secondary | ICD-10-CM | POA: Diagnosis not present

## 2014-01-26 DIAGNOSIS — G629 Polyneuropathy, unspecified: Secondary | ICD-10-CM

## 2014-01-26 DIAGNOSIS — Z0000 Encounter for general adult medical examination without abnormal findings: Secondary | ICD-10-CM | POA: Diagnosis not present

## 2014-01-26 DIAGNOSIS — K225 Diverticulum of esophagus, acquired: Secondary | ICD-10-CM

## 2014-01-26 DIAGNOSIS — M810 Age-related osteoporosis without current pathological fracture: Secondary | ICD-10-CM

## 2014-01-26 DIAGNOSIS — J301 Allergic rhinitis due to pollen: Secondary | ICD-10-CM

## 2014-01-26 DIAGNOSIS — Z13220 Encounter for screening for lipoid disorders: Secondary | ICD-10-CM

## 2014-01-26 DIAGNOSIS — I10 Essential (primary) hypertension: Secondary | ICD-10-CM | POA: Diagnosis not present

## 2014-01-26 NOTE — Progress Notes (Signed)
   Subjective:    Patient ID: Mary RoyaltySandra M Pileggi, female    DOB: [birth year], 68 y.o.   MRN: [id number]  HPI AWV- Annual Wellness Visit  The patient was seen for their annual wellness visit.  The patient's past medical history, surgical history, and family history were reviewed.  Pertinent vaccines were reviewed ( tetanus, pneumonia, shingles, flu) * Would like flu vaccine  The patient's medication list was reviewed and updated.  The height and weight were entered. The patient's current BMI is: 17  Cognitive screening was completed.  Outcome of Mini - Cog: PASS  Falls within the past 6 months: NO  Current tobacco usage: NO (All patients who use tobacco were given written and verbal information on quitting)  Recent listing of emergency department/hospitalizations over the past year were reviewed.  current specialist the patient sees on a regular basis: Dr. Garald BaldingBynaum and Dr. Santiago BurYoung   Medicare annual wellness visit patient questionnaire was reviewed.  A written screening schedule for the patient for the next 5-10 years was given. Appropriate discussion of followup regarding next visit was discussed.   Zen repair had a nicked esophagus, rquired a feedinf  Dr Verdell Carmineweams reg screening pe  Good mammo and good pap spear  Sept 2006 due late next supper  coverd for prevnar would like to have.  Has chronic rhinitis drainage congestion. Ongoing challenges. Uses allergy shots with some success but not a lot.  Chronic history of bronchiectasis sees pulmonary doctor regularly.  No focus exercise but does stay fairly active.  Notes progressive tingling in feet. Numb like sensation.: On for couple years. No loss of function. Recalls no injury.  History of very substantial osteoporosis. Claims compliance with all medication in this regard.  Reg exercise so so, not exercising per se  Does floral design  ,  Ginger bread house,,  Reg vit d cal supp etc.    shing vaccine alread done        Review of Systems No headache no chest pain and slight shortness of breath with exertion no nausea and no diaphoresis abdominal discomfort not present bowel is normal no trouble with blood in stools no rash ROS otherwise negative    Objective:   Physical Exam  Alert thin no acute distress HEENT moderate his condition neck supple Franks normal. Lungs clear. Heart regular rate and rhythm. Abdomen benign. Ankles without edema. Distal pulses intact no venous stasis. Sensation is diffusely diminished distal feet with paresthesias evident      Assessment & Plan:  Impression 1 early sensory neuropathy etiology unclear. #2 bronchiectasis discussed #3 chronic rhinitis discussed. #4 osteoporosis fairly substantial last osteoporosis screening just 2 years ago compliant with Fosamax. #5 allergic rhinitis stable plan appropriate blood work. Prevnar injection. Flu vaccine. Diet discussed. Exercise discussed. 35-40 minutes spent most and discussion. WS L

## 2014-01-28 ENCOUNTER — Ambulatory Visit: Payer: Medicare Other

## 2014-02-04 ENCOUNTER — Ambulatory Visit (INDEPENDENT_AMBULATORY_CARE_PROVIDER_SITE_OTHER): Payer: Medicare Other

## 2014-02-04 DIAGNOSIS — J309 Allergic rhinitis, unspecified: Secondary | ICD-10-CM | POA: Diagnosis not present

## 2014-02-04 DIAGNOSIS — Z13220 Encounter for screening for lipoid disorders: Secondary | ICD-10-CM | POA: Diagnosis not present

## 2014-02-04 DIAGNOSIS — G629 Polyneuropathy, unspecified: Secondary | ICD-10-CM | POA: Diagnosis not present

## 2014-02-04 DIAGNOSIS — G609 Hereditary and idiopathic neuropathy, unspecified: Secondary | ICD-10-CM | POA: Diagnosis not present

## 2014-02-04 DIAGNOSIS — I10 Essential (primary) hypertension: Secondary | ICD-10-CM | POA: Diagnosis not present

## 2014-02-04 DIAGNOSIS — Z79899 Other long term (current) drug therapy: Secondary | ICD-10-CM | POA: Diagnosis not present

## 2014-02-04 LAB — HEPATIC FUNCTION PANEL
ALT: 18 U/L (ref 0–35)
AST: 27 U/L (ref 0–37)
Albumin: 4.2 g/dL (ref 3.5–5.2)
Alkaline Phosphatase: 92 U/L (ref 39–117)
BILIRUBIN TOTAL: 0.7 mg/dL (ref 0.2–1.2)
Bilirubin, Direct: 0.2 mg/dL (ref 0.0–0.3)
Indirect Bilirubin: 0.5 mg/dL (ref 0.2–1.2)
Total Protein: 6.9 g/dL (ref 6.0–8.3)

## 2014-02-04 LAB — BASIC METABOLIC PANEL
BUN: 12 mg/dL (ref 6–23)
CHLORIDE: 103 meq/L (ref 96–112)
CO2: 26 meq/L (ref 19–32)
Calcium: 8.8 mg/dL (ref 8.4–10.5)
Creat: 0.72 mg/dL (ref 0.50–1.10)
Glucose, Bld: 86 mg/dL (ref 70–99)
Potassium: 4.3 mEq/L (ref 3.5–5.3)
SODIUM: 139 meq/L (ref 135–145)

## 2014-02-04 LAB — LIPID PANEL
CHOL/HDL RATIO: 2.4 ratio
Cholesterol: 148 mg/dL (ref 0–200)
HDL: 61 mg/dL (ref >39)
LDL CALC: 71 mg/dL (ref 0–99)
Triglycerides: 78 mg/dL (ref <150)
VLDL: 16 mg/dL (ref 0–40)

## 2014-02-04 LAB — VITAMIN B12: VITAMIN B 12: 773 pg/mL (ref 211–911)

## 2014-02-04 LAB — TSH: TSH: 2.799 u[IU]/mL (ref 0.350–4.500)

## 2014-02-04 LAB — FOLATE: Folate: >20 ng/mL

## 2014-02-08 ENCOUNTER — Encounter: Payer: Self-pay | Admitting: Family Medicine

## 2014-02-12 ENCOUNTER — Encounter: Payer: Self-pay | Admitting: Internal Medicine

## 2014-02-18 ENCOUNTER — Ambulatory Visit (INDEPENDENT_AMBULATORY_CARE_PROVIDER_SITE_OTHER): Payer: Medicare Other

## 2014-02-18 DIAGNOSIS — J309 Allergic rhinitis, unspecified: Secondary | ICD-10-CM

## 2014-03-04 ENCOUNTER — Ambulatory Visit (INDEPENDENT_AMBULATORY_CARE_PROVIDER_SITE_OTHER): Payer: Medicare Other

## 2014-03-04 DIAGNOSIS — J309 Allergic rhinitis, unspecified: Secondary | ICD-10-CM | POA: Diagnosis not present

## 2014-03-10 ENCOUNTER — Ambulatory Visit (INDEPENDENT_AMBULATORY_CARE_PROVIDER_SITE_OTHER): Payer: Medicare Other

## 2014-03-10 DIAGNOSIS — J309 Allergic rhinitis, unspecified: Secondary | ICD-10-CM | POA: Diagnosis not present

## 2014-03-16 DIAGNOSIS — H2513 Age-related nuclear cataract, bilateral: Secondary | ICD-10-CM | POA: Diagnosis not present

## 2014-03-18 ENCOUNTER — Ambulatory Visit (INDEPENDENT_AMBULATORY_CARE_PROVIDER_SITE_OTHER): Payer: Medicare Other

## 2014-03-18 DIAGNOSIS — J309 Allergic rhinitis, unspecified: Secondary | ICD-10-CM | POA: Diagnosis not present

## 2014-04-01 ENCOUNTER — Ambulatory Visit (INDEPENDENT_AMBULATORY_CARE_PROVIDER_SITE_OTHER): Payer: Medicare Other | Admitting: Emergency Medicine

## 2014-04-01 ENCOUNTER — Encounter: Payer: Self-pay | Admitting: Gastroenterology

## 2014-04-01 ENCOUNTER — Ambulatory Visit (INDEPENDENT_AMBULATORY_CARE_PROVIDER_SITE_OTHER): Payer: Medicare Other

## 2014-04-01 ENCOUNTER — Encounter: Payer: Self-pay | Admitting: Emergency Medicine

## 2014-04-01 DIAGNOSIS — R1314 Dysphagia, pharyngoesophageal phase: Secondary | ICD-10-CM | POA: Diagnosis not present

## 2014-04-01 DIAGNOSIS — J309 Allergic rhinitis, unspecified: Secondary | ICD-10-CM | POA: Diagnosis not present

## 2014-04-01 NOTE — Progress Notes (Signed)
Patient ID: Mary RoyaltySandra M Mckethan, female    DOB: 1945-12-19, 68 y.o.   MRN: [id number] HPI Mary [name] is a 68 year old woman whom I have followed for chronic cough. 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 Evaluated 2011 by Dr Maple HudsonYoung for allergies - shows multiple sensitivities that are present year round.   ROV 07/12/09 -- returns for f/u. Tells me that her PND has actually improved since last visit. Her regimen is benadryl 25mg  two times a day, NSW two times a day, flutter valve two times a day - sometimes productive but often not. Also on rotating doxy + azithromycin. No flares of bronchiectasis since last visit. She is contemplating starting allergy shots, hopefully can get them from her PCP.   ROV 10/01/09 -- f/u bronchiectasis, cough. Flare that started about a week ago. Began treatment with Avelox and set up this follow up. Was exposed to husband who had a URI. Changed her benadryl to Claritin D. She has not started allergy shots yet. Starting to feel better, not back to baseline. never started Singulair.   ROV 01/24/10 -- Hx chronic cough, bronchiectasis. Tried daliresp, couldn't tolerate due to insomnia. Still with clear drainage, never responded to treatment with nasal steroid. On benadryl qhs. Taking alternating doxy and azithro monthly. Considering starting allergy shots - discussing w Dr Maple HudsonYoung.   ROV 08/05/10 -- bronchiectasis, chronic cough, severe allergic rhinitis. Doing NSW qd and flutter qd.  She is on allergy shots and nasal gtt may be better. She is alternating doxy with azithro at the beginning of every month. Her cough is predicatble - morning and afternoon. Worst if she has been off abx for an extended period. Last CT scan was 2008.   10/19/10-  68 yo F  Never smoker followed for bronchiectasis/ hx M.avium, chronic cough, severe allergic rhinitis Started allergy vaccine in January, now at 1:50 with occasional minimal  local soreness but no problems. Says the spring pollen season was "rough"- increased nasal stuffiness and cough. Has been better in last 2 months. Still takes benadryl once each night. Hx of trying many nasal sprays. Still some nasal congestion and drip at times. She is interested in getting her shots in [place] at her PCP. allergy profile- IgE 2.4, negative common allergens. Negative Fungal antibody panel  01/20/11-  68 yo F  Never smoker followed for bronchiectasis/ hx M.avium, chronic cough (Dr Delton CoombesByrum), severe allergic rhinitis She returns for followup of her allergy vaccine therapy begun in January 2012. She says she had 2 good weeks at the beginning of September but now feels nasal congestion in the morning with watery rhinorrhea later in the day "like a cold". She has not been taking antihistamines very often, but prefers Benadryl. Cough is productive of clear to yellow sputum with no blood or fever. Dr Delton CoombesByrum is alternating antibiotics. Flu shot is pending with her primary physician  ROV 05/05/11 -- bronchiectasis, chronic cough, severe allergic rhinitis. Hx MAIC and also MRSA. She has started allergy shots w Dr Maple HudsonYoung. She still has congestion and drainage, assoc w dry cough. She is alternating abx (doxy/azithro) beginning every month. The cough and sputum is bad between abx and she often has to start early. Doxy is better than azithro, but it is taking longer for it to be effective. She is wondering if she might be having some reflux.   ROV 06/21/11 -- bronchiectasis w hx MAIC + MRSA, chronic cough both due to bronchiectasis and to  UA irritation, severe allergic rhinitis. Returns after sputum cx >> shows MAIC, sensitivities not yet available. She continues to have cough that is most productive in the am and at the end of the day.   ROV 08/04/11 -- bronchiectasis w hx MAIC + MRSA, chronic cough both due to bronchiectasis and to UA irritation, severe allergic rhinitis. We started clarithro + [facility]ambutol  [facility], started it on 4/15. She has the bitter taste in her mouth, had this last time. I had thought this was due to rifabutin, but possibly due to clarithro. Not currently on doxy. Since last time she thinks she had URI, now probably back to baseline.   ROV 11/13/11 -- bronchiectasis w hx MAIC + MRSA, chronic cough both due to bronchiectasis and to UA irritation, severe allergic rhinitis. Has been on clarithro/ethambutol since 07/17/11. She has less energy on the abx. She continues to have cough that is productive of either green or clear. The cough is most bothersome at night, can wake her from sleep. She coughs every morning - clears secretions. Then around mid-day she has another bout, then again in the evening.    ROV 01/22/12 -- bronchiectasis w hx MAIC + MRSA, chronic cough both due to bronchiectasis and to UA irritation, severe allergic rhinitis. [facility] April '[facility]. She finished the abx 01/12/12 (6 months). She continues to have cough and UA irritation.  Her nasal gtt is better but not gone; has never benefited from nasal steroid, astelin. Needs repeat CT scan 08/2012.   07/03/12- 68 yo F  Never smoker followed for bronchiectasis/ hx M.avium, chronic cough (Dr Delton CoombesByrum), severe allergic rhinitis FOLLOWS FOR: sneezing and runny nose; cough and drainage as well. Still on allergy vaccine Missed her allergy shots 1:10 GH for month of March after fall with cracked pelvis.  Incvreased sneeze, watery rhinorhea, much post-nasal drip. No headache, ears, purulent. Blames PND for increased cough from her bronchiectasis.  Taking a Sudafed-PE product containing an antihistamine, nasal saline irrigation.  ROV 07/24/12 -- bronchiectasis w hx MAIC + MRSA, chronic cough both due to bronchiectasis and to UA irritation, severe allergic rhinitis. She is on immunotherapy per Dr Maple HudsonYoung, doing NSW's. Had never benefited from nasal steroid or astelin in the past. Started dymista  on 4/2 from dr Maple HudsonYoung. Also had Ct scan sinuses > all clear 07/16/12.  She unfortunately fell and broke her pelvis in early March. Her cough continues, still yellow/green/brown. Occasionally some blood from nose or sputum. Breathing stable. Not currently on rotating abx.   ROV 09/25/12 -- bronchiectasis w hx MAIC + MRSA, chronic cough both due to bronchiectasis and to UA irritation, severe allergic rhinitis. We restarted rotating abx after last visit. She notes that sputum is no longer as colored, still thick. Alternating doxy and clarithro.  She has also been on Keflex long term for recurrent UTI's. She is back on allergy shots.   ROV 04/09/13 -- bronchiectasis w hx MAIC + MRSA, chronic cough both due to bronchiectasis and to UA irritation, severe allergic rhinitis. She has followed with Dr Maple HudsonYoung and receiving allergy shots. She has persistent sx, but recently also had a URI which made everything even worse. She has noticed that she got a skin rash associated with her doxycycline. She is having some problems swallowing - things getting hung up in he throat, difficulty taking clarithro.   ROV 05/21/13 -- bronchiectasis w hx MAIC + MRSA, chronic cough both due to bronchiectasis and to  UA irritation, severe allergic rhinitis. Last time treated with levaquin, stopped rotating abx. She has continued persistent cough, every day all day. Often not productive. She has seen some blood in sputum for last 2 weeks. Her barium study showed a zenker's diverticulum, small HH, some dilation. She has at times coughed enough to bring up small food particles, but she distinguishes this from true emesis. She tried albuterol some - mixed results.   ROV 09/10/13 -- Bronchiectasis w hx MAIC + MRSA, chronic cough both due to bronchiectasis and to UA irritation, severe allergic rhinitis. She is on max therapy for allergies including immunotherapy.  She recently underwent zenker's diverticulotomy on 08/01/13 with Dr Jenne Pane. She has improved  remarkably. She does still cough some, less productive and much less frequent. She is off nasacort, omeprazole, allergra right now.   ROV 12/03/13 -- Bronchiectasis w hx MAIC + MRSA, chronic cough both due to bronchiectasis and to UA irritation, severe allergic rhinitis.  She has been coughing more and seeing darker mucous, often with blood. She is not on abx right now.     ROV 04/01/14 -- f/u visit for bronchiectasis. She was treated with levaquin last time for a flare. She is back to her baseline - coughs for 30 minutes in the am, then around 1pm she starts up again for the rest of the day. She is still seeing blood. She describes some swallowing problems > takes 2 swallows to get her food down, sometimes lodges. She has a hx Zenker's diverticulum and repair.    Filed Vitals:   04/01/14 1445  BP: 100/72  Pulse: 73  Temp: 98.2 F (36.8 C)  TempSrc: Oral  Height: 5\' 2"  (1.575 m)  Weight: 102 lb (46.267 kg)  SpO2: 98%   Gen: Pleasant, well-nourished, in no distress,  normal affect  ENT: No lesions,  mouth clear,  oropharynx clear, no postnasal drip  Neck: No JVD, no TMG, no carotid bruits  Lungs: No use of accessory muscles, no dullness to percussion, clear without rales or rhonchi  Cardiovascular: RRR, heart sounds normal, no murmur or gallops, no peripheral edema  Musculoskeletal: No deformities, no cyanosis or clubbing  Neuro: alert, non focal  Skin: Warm, no lesions or rashes  04/14/13 barium swallow COMPARISON: 09/27/2012  FLUOROSCOPY TIME: 1 min, 4 seconds  FINDINGS:  Aleft eccentric 3.1 x 1.7 x 1.5 cm Zenker's diverticulum is observed  at approximately the C6-7 level of the cervical esophagus. Note is  made of interbody fusion at C5-C6-C7 which appears solid.  Pharyngeal phase of swallowing appears otherwise unremarkable.  Primary peristaltic waves in the esophagus were preserved on 4 out  of 4 swallows. Distal esophageal mucosal relief few unremarkable.  There is a  small hiatal hernia. A subtle distal esophageal mucosal  ring is observed, but measuring well over 2 cm in diameter and  accordingly not expected cause symptoms. The could a 13 mm barium  tablet passed without difficulty into the stomach.  IMPRESSION:  1. Left eccentric a Zenker's diverticulum measuring up to 3.1 cm in  long axis along the lower cervical esophagus.  2. Small hiatal hernia.  3. Widely patent distal esophageal mucosal ring, well over 2 cm in  diameter and accordingly not expected to cause symptoms   07/16/12 CT scan sinuses Comparison: 12/24/2006 and earlier.  Findings: Negative visualized noncontrast brain parenchyma, orbit  soft tissues, and face soft tissues.  Visualized mastoids and tympanic cavities are clear.  Hyperplastic sphenoid sinuses remain clear.  Ethmoid  air cells remain clear.  Frontal sinuses remain clear.  Maxillary sinuses remain clear. Both OMCs are patent.  Chronic rightward nasal septal deviation. No acute osseous  abnormality identified.  IMPRESSION:  Stable and clear paranasal sinuses.  CT chest 10/03/12 --  Comparison: Chest CTs dated 08/02/2006 and 08/10/2010.  Findings: The mediastinum has a stable appearance with mild  prominence of the ascending aorta, measuring 3.2 cm transverse.  Small mediastinal lymph nodes are unchanged. There are no  pathologically enlarged mediastinal, hilar or axillary lymph nodes.  There is no pleural or pericardial effusion.  The lungs have a fairly stable appearance. There is diffuse  bronchiectasis which is most advanced within the middle lobe,  lingula and both lower lobes. There is associated bronchial wall  thickening and scattered peribronchial ground-glass nodularity.  The nodularity is fluctuating, although there is no dominant mass  or confluent airspace opacity. Expiration imaging demonstrates no  significant air trapping.  The visualized upper abdomen has a stable appearance. There are no  worrisome  osseous findings.  IMPRESSION:  Little change in appearance of the lungs with diffuse  bronchiectasis and peribronchial nodularity consistent with chronic  infection. Again this pattern can be seen with atypical  mycobacterial infection. No dominant mass or focal airspace  disease demonstrated   BRONCHIECTASIS With refractory cough. Certainly could be infectious, recurrent MAIC or MRSA. Also could be related to the divertiuculum repair. At this point I believe she needs to assess her UA and GI tract. Also believe she would benefit from a second opinion from pulmonary. Depending on what we hear from Drs Jenne PaneBates and GI we will decide about a pulmonary referral

## 2014-04-01 NOTE — Patient Instructions (Signed)
We will arrange for you to see Dr Jenne PaneBates regarding the diverticulum.  We will also refer you to see gastroenterology regarding the swallowing difficulty.  We will consider referring you to see another Pulmonary Division to see if a second opinion would be helpful.  Continue your medications as you have been taking them  Follow with Dr Delton CoombesByrum in 1 month

## 2014-04-01 NOTE — Assessment & Plan Note (Signed)
With refractory cough. Certainly could be infectious, recurrent MAIC or MRSA. Also could be related to the divertiuculum repair. At this point I believe she needs to assess her UA and GI tract. Also believe she would benefit from a second opinion from pulmonary. Depending on what we hear from Drs Jenne PaneBates and GI we will decide about a pulmonary referral

## 2014-04-10 ENCOUNTER — Ambulatory Visit: Payer: Medicare Other | Admitting: Gastroenterology

## 2014-04-15 ENCOUNTER — Ambulatory Visit (INDEPENDENT_AMBULATORY_CARE_PROVIDER_SITE_OTHER): Payer: Medicare Other | Admitting: Nurse Practitioner

## 2014-04-15 ENCOUNTER — Encounter: Payer: Self-pay | Admitting: Nurse Practitioner

## 2014-04-15 ENCOUNTER — Ambulatory Visit: Payer: Medicare Other

## 2014-04-15 VITALS — BP 138/80 | Temp 98.5°F | Ht 64.0 in | Wt 104.1 lb

## 2014-04-15 DIAGNOSIS — R062 Wheezing: Secondary | ICD-10-CM | POA: Diagnosis not present

## 2014-04-15 DIAGNOSIS — J0100 Acute maxillary sinusitis, unspecified: Secondary | ICD-10-CM | POA: Diagnosis not present

## 2014-04-15 MED ORDER — LEVOFLOXACIN 500 MG PO TABS: 500.0000 mg | ORAL_TABLET | Freq: Every day | ORAL | Status: DC

## 2014-04-15 MED ORDER — PREDNISONE 20 MG PO TABS: ORAL_TABLET | ORAL | Status: DC

## 2014-04-19 ENCOUNTER — Encounter: Payer: Self-pay | Admitting: Nurse Practitioner

## 2014-04-19 NOTE — Progress Notes (Signed)
Subjective:  Presents for c/o congestion and cough that began 3 days ago. Has COPD and bronchiectasis. Some wheezing. Does not use albuterol; states it does not help. Followed by pulmonology. No fever. Maxillary area headache. Producing brown, yellow green sputum. No ear pain. Slight sore throat. No acid reflux.   Objective:   BP 138/80 mmHg  Temp(Src) 98.5 F (36.9 C) (Oral)  Ht 5\' 4"  (1.626 m)  Wt 104 lb 2 oz (47.231 kg)  BMI 17.86 kg/m2 NAD. Alert, oriented. TMs clear effusion. Pharynx injected with PND noted. Neck supple with mild anterior adenopathy. Lungs: mildly diminished breath sounds with faint expiratory wheezing only posterior. Anterior clear. Heart RRR.   Assessment: Acute maxillary sinusitis, recurrence not specified  Wheezing  Plan:  Meds ordered this encounter  Medications  . levofloxacin (LEVAQUIN) 500 MG tablet    Sig: Take 1 tablet (500 mg total) by mouth daily.    Dispense:  10 tablet    Refill:  0    Order Specific Question:  Supervising Provider    Answer:  Merlyn AlbertLUKING, WILLIAM S [[id number]]  . predniSONE (DELTASONE) 20 MG tablet    Sig: 2 po qd x 5 d    Dispense:  10 tablet    Refill:  0    Order Specific Question:  Supervising Provider    Answer:  Merlyn AlbertLUKING, WILLIAM S [[id number]]   OTC meds as directed. Call back in 48 hours if no improvement, call or go to ED sooner if worse.

## 2014-04-20 DIAGNOSIS — K225 Diverticulum of esophagus, acquired: Secondary | ICD-10-CM | POA: Diagnosis not present

## 2014-04-20 DIAGNOSIS — R1313 Dysphagia, pharyngeal phase: Secondary | ICD-10-CM | POA: Diagnosis not present

## 2014-04-20 DIAGNOSIS — J479 Bronchiectasis, uncomplicated: Secondary | ICD-10-CM | POA: Diagnosis not present

## 2014-04-20 DIAGNOSIS — K219 Gastro-esophageal reflux disease without esophagitis: Secondary | ICD-10-CM | POA: Diagnosis not present

## 2014-04-22 ENCOUNTER — Ambulatory Visit: Payer: Medicare Other | Admitting: Gastroenterology

## 2014-04-22 ENCOUNTER — Ambulatory Visit (INDEPENDENT_AMBULATORY_CARE_PROVIDER_SITE_OTHER): Payer: Medicare Other

## 2014-04-22 DIAGNOSIS — J309 Allergic rhinitis, unspecified: Secondary | ICD-10-CM

## 2014-04-27 ENCOUNTER — Other Ambulatory Visit: Payer: Self-pay | Admitting: Family Medicine

## 2014-05-05 ENCOUNTER — Ambulatory Visit: Payer: BLUE CROSS/BLUE SHIELD

## 2014-05-06 ENCOUNTER — Ambulatory Visit (INDEPENDENT_AMBULATORY_CARE_PROVIDER_SITE_OTHER): Payer: Medicare Other

## 2014-05-06 DIAGNOSIS — J309 Allergic rhinitis, unspecified: Secondary | ICD-10-CM

## 2014-05-11 ENCOUNTER — Encounter: Payer: Self-pay | Admitting: Internal Medicine

## 2014-05-20 ENCOUNTER — Ambulatory Visit (INDEPENDENT_AMBULATORY_CARE_PROVIDER_SITE_OTHER): Payer: Medicare Other

## 2014-05-20 DIAGNOSIS — J309 Allergic rhinitis, unspecified: Secondary | ICD-10-CM

## 2014-06-01 ENCOUNTER — Ambulatory Visit: Payer: Medicare Other | Admitting: Emergency Medicine

## 2014-06-03 ENCOUNTER — Ambulatory Visit (INDEPENDENT_AMBULATORY_CARE_PROVIDER_SITE_OTHER): Payer: Medicare Other

## 2014-06-03 DIAGNOSIS — J309 Allergic rhinitis, unspecified: Secondary | ICD-10-CM

## 2014-06-17 ENCOUNTER — Ambulatory Visit (INDEPENDENT_AMBULATORY_CARE_PROVIDER_SITE_OTHER): Payer: Medicare Other

## 2014-06-17 DIAGNOSIS — J309 Allergic rhinitis, unspecified: Secondary | ICD-10-CM | POA: Diagnosis not present

## 2014-07-01 ENCOUNTER — Telehealth: Payer: Self-pay

## 2014-07-01 ENCOUNTER — Ambulatory Visit (INDEPENDENT_AMBULATORY_CARE_PROVIDER_SITE_OTHER): Payer: Medicare Other

## 2014-07-01 DIAGNOSIS — J309 Allergic rhinitis, unspecified: Secondary | ICD-10-CM | POA: Diagnosis not present

## 2014-07-01 NOTE — Telephone Encounter (Signed)
lmtcb X1 to make pt aware that allergy lab closes at 5 today, and she will need to arrive by 5:00 to receive her allergy injection today.

## 2014-07-15 ENCOUNTER — Ambulatory Visit (INDEPENDENT_AMBULATORY_CARE_PROVIDER_SITE_OTHER): Payer: Medicare Other

## 2014-07-15 DIAGNOSIS — K225 Diverticulum of esophagus, acquired: Secondary | ICD-10-CM | POA: Diagnosis not present

## 2014-07-15 DIAGNOSIS — J309 Allergic rhinitis, unspecified: Secondary | ICD-10-CM | POA: Diagnosis not present

## 2014-07-15 DIAGNOSIS — R1313 Dysphagia, pharyngeal phase: Secondary | ICD-10-CM | POA: Diagnosis not present

## 2014-07-17 ENCOUNTER — Other Ambulatory Visit: Payer: Self-pay | Admitting: Otolaryngology

## 2014-07-17 DIAGNOSIS — R1310 Dysphagia, unspecified: Secondary | ICD-10-CM

## 2014-07-17 DIAGNOSIS — K225 Diverticulum of esophagus, acquired: Secondary | ICD-10-CM

## 2014-07-22 ENCOUNTER — Ambulatory Visit
Admission: RE | Admit: 2014-07-22 | Discharge: 2014-07-22 | Disposition: A | Discharge status: Home / Self Care | Payer: Medicare Other | Source: Ambulatory Visit | Attending: Otolaryngology | Admitting: Otolaryngology

## 2014-07-22 DIAGNOSIS — K219 Gastro-esophageal reflux disease without esophagitis: Secondary | ICD-10-CM | POA: Diagnosis not present

## 2014-07-22 DIAGNOSIS — K225 Diverticulum of esophagus, acquired: Secondary | ICD-10-CM

## 2014-07-22 DIAGNOSIS — R1310 Dysphagia, unspecified: Secondary | ICD-10-CM

## 2014-07-22 DIAGNOSIS — K449 Diaphragmatic hernia without obstruction or gangrene: Secondary | ICD-10-CM | POA: Diagnosis not present

## 2014-07-29 ENCOUNTER — Ambulatory Visit (INDEPENDENT_AMBULATORY_CARE_PROVIDER_SITE_OTHER): Payer: Medicare Other

## 2014-07-29 DIAGNOSIS — J309 Allergic rhinitis, unspecified: Secondary | ICD-10-CM

## 2014-08-10 ENCOUNTER — Ambulatory Visit (INDEPENDENT_AMBULATORY_CARE_PROVIDER_SITE_OTHER): Payer: Medicare Other | Admitting: Family Medicine

## 2014-08-10 ENCOUNTER — Encounter: Payer: Self-pay | Admitting: Family Medicine

## 2014-08-10 VITALS — BP 108/70 | Temp 98.0°F | Ht 64.0 in | Wt 105.0 lb

## 2014-08-10 DIAGNOSIS — J301 Allergic rhinitis due to pollen: Secondary | ICD-10-CM | POA: Diagnosis not present

## 2014-08-10 DIAGNOSIS — R300 Dysuria: Secondary | ICD-10-CM | POA: Diagnosis not present

## 2014-08-10 DIAGNOSIS — G629 Polyneuropathy, unspecified: Secondary | ICD-10-CM | POA: Diagnosis not present

## 2014-08-10 DIAGNOSIS — I10 Essential (primary) hypertension: Secondary | ICD-10-CM

## 2014-08-10 LAB — POCT URINALYSIS DIPSTICK: pH, UA: 5

## 2014-08-10 MED ORDER — CIPROFLOXACIN HCL 250 MG PO TABS: 250.0000 mg | ORAL_TABLET | Freq: Two times a day (BID) | ORAL | Status: DC

## 2014-08-10 NOTE — Progress Notes (Signed)
   Subjective:    Patient ID: Mary [name], female    DOB: [birth year], 69 y.o.   MRN: [id number]  HPI Patient is here today for numbness in her feet and legs. This has been present for a while now. Patient would like to discuss this with the doctor.  Patient states she woke up this morning with dysuria and urinary frequency also. Treatments tried: increased fluids with no relief. Some dysuria  Numbness is aggravating along with shooting pain at times. Seems to be worsening. Primarily involving both feet.  Seen a half year ago from numerous palms. Screening blood work done then. Nonremarkable.  No loss of balance. 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 Importance for referral discuss easily 25 minutes spent most in discussion WSL

## 2014-08-12 ENCOUNTER — Ambulatory Visit (INDEPENDENT_AMBULATORY_CARE_PROVIDER_SITE_OTHER): Payer: Medicare Other

## 2014-08-12 DIAGNOSIS — J309 Allergic rhinitis, unspecified: Secondary | ICD-10-CM | POA: Diagnosis not present

## 2014-08-26 ENCOUNTER — Ambulatory Visit (INDEPENDENT_AMBULATORY_CARE_PROVIDER_SITE_OTHER): Payer: Medicare Other

## 2014-08-26 DIAGNOSIS — J309 Allergic rhinitis, unspecified: Secondary | ICD-10-CM | POA: Diagnosis not present

## 2014-09-09 ENCOUNTER — Ambulatory Visit (INDEPENDENT_AMBULATORY_CARE_PROVIDER_SITE_OTHER): Payer: Medicare Other

## 2014-09-09 ENCOUNTER — Encounter: Payer: Self-pay | Admitting: Emergency Medicine

## 2014-09-09 ENCOUNTER — Ambulatory Visit (INDEPENDENT_AMBULATORY_CARE_PROVIDER_SITE_OTHER): Payer: Medicare Other | Admitting: Emergency Medicine

## 2014-09-09 VITALS — BP 100/62 | HR 82 | Ht 64.0 in | Wt 102.0 lb

## 2014-09-09 DIAGNOSIS — J479 Bronchiectasis, uncomplicated: Secondary | ICD-10-CM | POA: Diagnosis not present

## 2014-09-09 DIAGNOSIS — J309 Allergic rhinitis, unspecified: Secondary | ICD-10-CM

## 2014-09-09 DIAGNOSIS — J301 Allergic rhinitis due to pollen: Secondary | ICD-10-CM

## 2014-09-09 DIAGNOSIS — K225 Diverticulum of esophagus, acquired: Secondary | ICD-10-CM | POA: Diagnosis not present

## 2014-09-09 MED ORDER — BUDESONIDE-FORMOTEROL FUMARATE 160-4.5 MCG/ACT IN AERO: 2.0000 | INHALATION_SPRAY | Freq: Two times a day (BID) | RESPIRATORY_TRACT | Status: DC

## 2014-09-09 MED ORDER — OMEPRAZOLE 20 MG PO CPDR: 20.0000 mg | DELAYED_RELEASE_CAPSULE | Freq: Every day | ORAL | Status: DC

## 2014-09-09 NOTE — Assessment & Plan Note (Signed)
Her cough is persistent and I believe that in large part it is due to her bronchiectasis although clearly she also has components of upper airway irritation due to GERD, dysphagia, allergies. I will like to start her on Symbicort to see if this benefits either her breathing or her ability to clear secretions.

## 2014-09-09 NOTE — Assessment & Plan Note (Signed)
We will continue her antihistamine and nasal steroid

## 2014-09-09 NOTE — Assessment & Plan Note (Signed)
She is following with Dr. Jenne PaneBates. Unfortunately she continues to have a small diverticulum but is not clear that surgery will intervention is going to cause clinical change. She would like to avoid this if possible

## 2014-09-09 NOTE — Patient Instructions (Signed)
Please continue your current medications as you have been taking them Restart omeprazole 20 mg once a day Start Symbicort 160/4.5 g, 2 puffs twice a day with spacer. Please rinse and gargle after using this medication Follow with Dr. Jenne PaneBates as planned Follow with Dr. Delton CoombesByrum in 2 months to assess your progress on the inhaled medication

## 2014-09-09 NOTE — Addendum Note (Signed)
Addended by: Jaynee EaglesLEMONS, LINDSAY C on: 09/09/2014 03:08 PM   Modules accepted: Orders

## 2014-09-09 NOTE — Progress Notes (Signed)
Patient ID: Mary RoyaltySandra M [name], female    DOB: 1945-12-19, 69 y.o.   MRN: [id number] HPI Mary [name] is a 69 year old woman whom I have followed for chronic cough. 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 Evaluated 2011 by Dr Maple HudsonYoung for allergies - shows multiple sensitivities that are present year round.   ROV 07/12/09 -- returns for f/u. Tells me that her PND has actually improved since last visit. Her regimen is benadryl 25mg  two times a day, NSW two times a day, flutter valve two times a day - sometimes productive but often not. Also on rotating doxy + azithromycin. No flares of bronchiectasis since last visit. She is contemplating starting allergy shots, hopefully can get them from her PCP.   ROV 10/01/09 -- f/u bronchiectasis, cough. Flare that started about a week ago. Began treatment with Avelox and set up this follow up. Was exposed to husband who had a URI. Changed her benadryl to Claritin D. She has not started allergy shots yet. Starting to feel better, not back to baseline. never started Singulair.   ROV 01/24/10 -- Hx chronic cough, bronchiectasis. Tried daliresp, couldn't tolerate due to insomnia. Still with clear drainage, never responded to treatment with nasal steroid. On benadryl qhs. Taking alternating doxy and azithro monthly. Considering starting allergy shots - discussing w Dr Maple HudsonYoung.   ROV 08/05/10 -- bronchiectasis, chronic cough, severe allergic rhinitis. Doing NSW qd and flutter qd.  She is on allergy shots and nasal gtt may be better. She is alternating doxy with azithro at the beginning of every month. Her cough is predicatble - morning and afternoon. Worst if she has been off abx for an extended period. Last CT scan was 2008.   10/19/10-  69 yo F  Never smoker followed for bronchiectasis/ hx M.avium, chronic cough, severe allergic rhinitis Started allergy vaccine in January, now at 1:50 with occasional minimal  local soreness but no problems. Says the spring pollen season was "rough"- increased nasal stuffiness and cough. Has been better in last 2 months. Still takes benadryl once each night. Hx of trying many nasal sprays. Still some nasal congestion and drip at times. She is interested in getting her shots in [place] at her PCP. allergy profile- IgE 2.4, negative common allergens. Negative Fungal antibody panel  01/20/11-  69 yo F  Never smoker followed for bronchiectasis/ hx M.avium, chronic cough (Dr Delton CoombesByrum), severe allergic rhinitis She returns for followup of her allergy vaccine therapy begun in January 2012. She says she had 2 good weeks at the beginning of September but now feels nasal congestion in the morning with watery rhinorrhea later in the day "like a cold". She has not been taking antihistamines very often, but prefers Benadryl. Cough is productive of clear to yellow sputum with no blood or fever. Dr Delton CoombesByrum is alternating antibiotics. Flu shot is pending with her primary physician  ROV 05/05/11 -- bronchiectasis, chronic cough, severe allergic rhinitis. Hx MAIC and also MRSA. She has started allergy shots w Dr Maple HudsonYoung. She still has congestion and drainage, assoc w dry cough. She is alternating abx (doxy/azithro) beginning every month. The cough and sputum is bad between abx and she often has to start early. Doxy is better than azithro, but it is taking longer for it to be effective. She is wondering if she might be having some reflux.   ROV 06/21/11 -- bronchiectasis w hx MAIC + MRSA, chronic cough both due to bronchiectasis and to  UA irritation, severe allergic rhinitis. Returns after sputum cx >> shows MAIC, sensitivities not yet available. She continues to have cough that is most productive in the am and at the end of the day.   ROV 08/04/11 -- bronchiectasis w hx MAIC + MRSA, chronic cough both due to bronchiectasis and to UA irritation, severe allergic rhinitis. We [facility] clarithro [facility]  [facility], started it on 4/15. She has the bitter taste in her mouth, had this last time. I had thought this was due to rifabutin, but possibly due to clarithro. Not currently on doxy. Since last time she thinks she had URI, now probably back to baseline.   ROV 11/13/11 -- bronchiectasis w hx MAIC + MRSA, chronic cough both due to bronchiectasis and to UA irritation, severe allergic rhinitis. Has been on clarithro/ethambutol since 07/17/11. She has less energy on the abx. She continues to have cough that is productive of either green or clear. The cough is most bothersome at night, can wake her from sleep. She coughs every morning - clears secretions. Then around mid-day she has another bout, then again in the evening.    ROV 01/22/12 -- bronchiectasis w hx MAIC + MRSA, chronic cough both due to bronchiectasis and to UA irritation, severe allergic rhinitis. [facility] April '[facility]. She finished the abx 01/12/12 (6 months). She continues to have cough and UA irritation.  Her nasal gtt is better but not gone; has never benefited from nasal steroid, astelin. Needs repeat CT scan 08/2012.   07/03/12- 69 yo F  Never smoker followed for bronchiectasis/ hx M.avium, chronic cough (Dr Delton CoombesByrum), severe allergic rhinitis FOLLOWS FOR: sneezing and runny nose; cough and drainage as well. Still on allergy vaccine Missed her allergy shots 1:10 GH for month of March after fall with cracked pelvis.  Incvreased sneeze, watery rhinorhea, much post-nasal drip. No headache, ears, purulent. Blames PND for increased cough from her bronchiectasis.  Taking a Sudafed-PE product containing an antihistamine, nasal saline irrigation.  ROV 07/24/12 -- bronchiectasis w hx MAIC + MRSA, chronic cough both due to bronchiectasis and to UA irritation, severe allergic rhinitis. She is on immunotherapy per Dr Maple HudsonYoung, doing NSW's. Had never benefited from nasal steroid or astelin in the past. Started dymista  on 4/2 from dr Maple HudsonYoung. Also had Ct scan sinuses > all clear 07/16/12.  She unfortunately fell and broke her pelvis in early March. Her cough continues, still yellow/green/brown. Occasionally some blood from nose or sputum. Breathing stable. Not currently on rotating abx.   ROV 09/25/12 -- bronchiectasis w hx MAIC + MRSA, chronic cough both due to bronchiectasis and to UA irritation, severe allergic rhinitis. We restarted rotating abx after last visit. She notes that sputum is no longer as colored, still thick. Alternating doxy and clarithro.  She has also been on Keflex long term for recurrent UTI's. She is back on allergy shots.   ROV 04/09/13 -- bronchiectasis w hx MAIC + MRSA, chronic cough both due to bronchiectasis and to UA irritation, severe allergic rhinitis. She has followed with Dr Maple HudsonYoung and receiving allergy shots. She has persistent sx, but recently also had a URI which made everything even worse. She has noticed that she got a skin rash associated with her doxycycline. She is having some problems swallowing - things getting hung up in he throat, difficulty taking clarithro.   ROV 05/21/13 -- bronchiectasis w hx MAIC + MRSA, chronic cough both due to bronchiectasis and to  UA irritation, severe allergic rhinitis. Last time treated with levaquin, stopped rotating abx. She has continued persistent cough, every day all day. Often not productive. She has seen some blood in sputum for last 2 weeks. Her barium study showed a zenker's diverticulum, small HH, some dilation. She has at times coughed enough to bring up small food particles, but she distinguishes this from true emesis. She tried albuterol some - mixed results.   ROV 09/10/13 -- Bronchiectasis w hx MAIC + MRSA, chronic cough both due to bronchiectasis and to UA irritation, severe allergic rhinitis. She is on max therapy for allergies including immunotherapy.  She recently underwent zenker's diverticulotomy on 08/01/13 with Dr Jenne PaneBates. She has improved  remarkably. She does still cough some, less productive and much less frequent. She is off nasacort, omeprazole, allergra right now.   ROV 12/03/13 -- Bronchiectasis w hx MAIC + MRSA, chronic cough both due to bronchiectasis and to UA irritation, severe allergic rhinitis.  She has been coughing more and seeing darker mucous, often with blood. She is not on abx right now.     ROV 04/01/14 -- f/u visit for bronchiectasis. She was treated with levaquin last time for a flare. She is back to her baseline - coughs for 30 minutes in the am, then around 1pm she starts up again for the rest of the day. She is still seeing blood. She describes some swallowing problems > takes 2 swallows to get her food down, sometimes lodges. She has a hx Zenker's diverticulum and repair.   ROV 09/09/14 -- follow-up visit for bronchiectasis and chronic cough that is likely multifactorial. 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 On 07/22/14 she underwent a barium swallow which I have reviewed today. It showed no change in her lower cervical lateral esophageal diverticulum. Some residual barium remained in that diverticulum. She has a small hiatal hernia and some associated GERD. She has seen Dr Jenne PaneBates, tried increased dose prilosec and she saw some improvement. She is currently off. She coughs in the am, clears mucous. Then she has more cough around lunch. She is on anti- histamine and a nasal steroid   Filed Vitals:   09/09/14 1423 09/09/14 1424  BP:  100/62  Pulse:  82  Height: 5\' 4"  (1.626 m)   Weight: 102 lb (46.267 kg)   SpO2:  93%   Gen: Pleasant, well-nourished, in no distress,  normal affect  ENT: No lesions,  mouth clear,  oropharynx clear, no postnasal drip  Neck: No JVD, no TMG, no carotid bruits  Lungs: No use of accessory muscles, no dullness to percussion, clear without rales or rhonchi  Cardiovascular: RRR, heart sounds normal, no murmur or gallops, no peripheral  edema  Musculoskeletal: No deformities, no cyanosis or clubbing  Neuro: alert, non focal  Skin: Warm, no lesions or rashes    Zenker's diverticulum She is following with Dr. Jenne PaneBates. Unfortunately she continues to have a small diverticulum but is not clear that surgery will intervention is going to cause clinical change. She would like to avoid this if possible  Allergic rhinitis due to pollen We will continue her antihistamine and nasal steroid  BRONCHIECTASIS Her cough is persistent and I believe that in large part it is due to her bronchiectasis although clearly she also has components of upper airway irritation due to GERD, dysphagia, allergies. I will like to start her on Symbicort to see if this benefits either her breathing or her ability  to clear secretions.

## 2014-09-23 ENCOUNTER — Ambulatory Visit (INDEPENDENT_AMBULATORY_CARE_PROVIDER_SITE_OTHER): Payer: Medicare Other

## 2014-09-23 DIAGNOSIS — J309 Allergic rhinitis, unspecified: Secondary | ICD-10-CM

## 2014-09-25 ENCOUNTER — Ambulatory Visit (INDEPENDENT_AMBULATORY_CARE_PROVIDER_SITE_OTHER): Payer: Medicare Other | Admitting: Neurology

## 2014-09-25 ENCOUNTER — Encounter: Payer: Self-pay | Admitting: Neurology

## 2014-09-25 VITALS — BP 110/60 | HR 74 | Ht 64.0 in | Wt 102.9 lb

## 2014-09-25 DIAGNOSIS — G609 Hereditary and idiopathic neuropathy, unspecified: Secondary | ICD-10-CM | POA: Diagnosis not present

## 2014-09-25 NOTE — Progress Notes (Signed)
[facility] Clinic Note - Initial Visit   Date: 09/25/2014  Mary [name] MRN: [id number] DOB: 12/01/1945   Dear Dr. Gerda Diss:  Thank you for your kind referral of Mary [name] for consultation of bilateral feet paresthesias.  Although her history is well known to you, please allow [place] to reiterate it for the purpose of our medical record. The patient was accompanied to the clinic by self.   History of Present Illness: Mary [name] is a 69 y.o. right-handed Caucasian female with GERD, allergies, and osteoporosis presenting for evaluation of bilateral feet paresthesias.    Starting about 2014, she started having right foot numbness and later had the same symptoms in her left foot.  She describes a constant numbness and tight sensation, as if her leg is asleep.  She has no burning sensation, but seldom she has brief intense sharp pain.  She denies low back pain, weakness, or balance.  She walks independently, no falls.  Nothing that alleviates or exacerbates symptoms.   She has one episode of left leg cramping where she had to get up and walk.  She has rare spells of restless sensation of her legs where she wants to move them.     Out-side paper records, electronic medical record, and images have been reviewed where available and summarized as:  Labs 11//2015: TSH 2.79, vitamin B12 773    Past Medical History  Diagnosis Date  . Osteoporosis   . Allergic rhinitis     using Nasocort daily and Allegra daily  . Mycobacterium avium complex   . Bronchiectasis     uses Albuterol daily as needed but has been a while since used(more than a month ago),  . Chronic cough   . Diverticulosis   . Zenker's diverticulum   . PONV (postoperative nausea and vomiting)   . Joint pain   . GERD (gastroesophageal reflux disease)     takes Prilosec daily  . History of bladder infections     takes Keflex daily;Dr.Ottin is Insurance underwriter  . History of MRSA  infection     several yrs ago  . Osteoporosis     takes Fosamax weekly  . COPD (chronic obstructive pulmonary disease)   . Pneumonia 1990's  . Arthritis     "hands" (08/01/2013)    Past Surgical History  Procedure Laterality Date  . Anterior cervical decomp/discectomy fusion  ~ 1992  . Esophagogastroduodenoscopy    . Colonoscopy    . Zenker's diverticulectomy endoscopic  08/01/2013  . Breast biopsy Left      Medications:  Current Outpatient Prescriptions on File Prior to Visit  Medication Sig Dispense Refill  . alendronate (FOSAMAX) 70 MG tablet TAKE 1 TABLET BY MOUTH ONCE WEEKLY AS DIRECTED 4 tablet 5  . aspirin EC 81 MG tablet Take 81 mg by mouth daily.    . budesonide-formoterol (SYMBICORT) 160-4.5 MCG/ACT inhaler Inhale 2 puffs into the lungs 2 (two) times daily. 1 Inhaler 2  . Calcium-Vitamin D-Vitamin K (VIACTIV PO) Take 2 each by mouth daily.    . fexofenadine (ALLEGRA) 180 MG tablet Take 180 mg by mouth daily.    . Multiple Vitamins-Minerals (ONE-A-DAY 50 PLUS PO) Take 2 tablets by mouth daily.     Marland Kitchen omeprazole (PRILOSEC) 20 MG capsule Take 1 capsule (20 mg total) by mouth daily. 30 capsule 2  . triamcinolone (NASACORT AQ) 55 MCG/ACT AERO nasal inhaler Place 2 sprays into the nose daily.     No current facility-administered medications  on file prior to visit.    Allergies:  Allergies  Allergen Reactions  . Sulfamethoxazole-Trimethoprim     REACTION: Rash  . Doxycycline Rash    Splotches on skin    Family History: Family History  Problem Relation Age of Onset  . Allergies Mother   . Heart disease Mother   . Heart disease Father   . Stroke Father     Social History: History   Social History  . Marital Status: Married    Spouse Name: N/A  . Number of Children: 1  . Years of Education: N/A   Occupational History  . 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 Smoking status: Never Smoker   . Smokeless tobacco: Never Used  .  Alcohol Use: No  . Drug Use: No  . Sexual Activity: Not Currently   Other Topics Concern  . Not on file   Social History Narrative    Review of Systems:  CONSTITUTIONAL: No fevers, chills, night sweats, or weight loss.   EYES: No visual changes or eye pain ENT: No hearing changes.  No history of nose bleeds.   RESPIRATORY: No cough, wheezing and shortness of breath.   CARDIOVASCULAR: Negative for chest pain, and palpitations.   GI: Negative for abdominal discomfort, blood in stools or black stools.  No recent change in bowel habits.   GU:  No history of incontinence.   MUSCLOSKELETAL: No history of joint pain or swelling.  No myalgias.   SKIN: Negative for lesions, rash, and itching.   HEMATOLOGY/ONCOLOGY: Negative for prolonged bleeding, bruising easily, and swollen nodes.  No history of cancer.   ENDOCRINE: Negative for cold or heat intolerance, polydipsia or goiter.   PSYCH:  No depression or anxiety symptoms.   NEURO: As Above.   Vital Signs:  BP 110/60 mmHg  Pulse 74  Ht 5\' 4"  (1.626 m)  Wt 102 lb 14.4 oz (46.675 kg)  BMI 17.65 kg/m2 Pain Scale: 0 on a scale of 0-10   General Medical Exam:   General:  Well appearing, comfortable.   Eyes/ENT: see cranial nerve examination.   Neck: No masses appreciated.  Full range of motion without tenderness.  No carotid bruits. Respiratory:  Clear to auscultation, good air entry bilaterally.   Cardiac:  Regular rate and rhythm, no murmur.   Extremities:  No deformities, edema, or skin discoloration.  Skin:  No rashes or lesions.  Neurological Exam: MENTAL STATUS including orientation to time, place, person, recent and remote memory, attention span and concentration, language, and fund of knowledge is normal.  Speech is not dysarthric.  CRANIAL NERVES: II:  No visual field defects.  Unremarkable fundi.   III-IV-VI: Pupils equal round and reactive to light.  Normal conjugate, extra-ocular eye movements in all directions of gaze.   No nystagmus.  No ptosis.   V:  Normal facial sensation.   VII:  Normal facial symmetry and movements.  No pathologic facial reflexes.  VIII:  Normal hearing and vestibular function.   IX-X:  Normal palatal movement.   XI:  Normal shoulder shrug and head rotation.   XII:  Normal tongue strength and range of motion, no deviation or fasciculation.  MOTOR:  No atrophy, fasciculations or abnormal movements.  No pronator drift.  Tone is normal.    Right Upper Extremity:    Left Upper Extremity:    Deltoid  5/5   Deltoid  5/5   Biceps  5/5   Biceps  5/5   Triceps  5/5   Triceps  5/5   Wrist extensors  5/5   Wrist extensors  5/5   Wrist flexors  5/5   Wrist flexors  5/5   Finger extensors  5/5   Finger extensors  5/5   Finger flexors  5/5   Finger flexors  5/5   Dorsal interossei  5/5   Dorsal interossei  5/5   Abductor pollicis  5/5   Abductor pollicis  5/5   Tone (Ashworth scale)  0  Tone (Ashworth scale)  0   Right Lower Extremity:    Left Lower Extremity:    Hip flexors  5/5   Hip flexors  5/5   Hip extensors  5/5   Hip extensors  5/5   Knee flexors  5/5   Knee flexors  5/5   Knee extensors  5/5   Knee extensors  5/5   Dorsiflexors  5/5   Dorsiflexors  5/5   Plantarflexors  5/5   Plantarflexors  5/5   Toe extensors  5/5   Toe extensors  5/5   Toe flexors  5/5   Toe flexors  5/5   Tone (Ashworth scale)  0  Tone (Ashworth scale)  0   MSRs:  Right                                                                 Left brachioradialis 2+  brachioradialis 2+  biceps 2+  biceps 2+  triceps 2+  triceps 2+  patellar 2+  patellar 2+  ankle jerk 2+  ankle jerk 2+  Hoffman no  Hoffman no  plantar response down  plantar response down   SENSORY:  Reduced vibration at the great toe bilaterally, otherwise normal and symmetric perception of light touch, pinprick, vibration, and proprioception.  Romberg's sign absent.   COORDINATION/GAIT: Normal finger-to- nose-finger and heel-to-shin.  Intact  rapid alternating movements bilaterally.  Able to rise from a chair without using arms.  Gait narrow based and stable. Tandem and stressed gait intact.    IMPRESSION: Ms. Harwick is a pleasant 69 year-old female presenting for evaluation of bilateral feet paresthesias.  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  Reflexes and other sensory modalities are intact.  I had extensive discussion with the patient regarding the pathogenesis, etiology, management, and natural course of neuropathy.  Neuropathy can be diagnosed with EMG, but with her classic features, I do not feel it is necessary unless her symptoms change in a matter atypical for neuropathy.  Neuropathy tends to be slowly progressive, especially if a treatable etiology is not identified.  She has no neuropathy risk factors and I suspect it is most likely idiopathic.  Family history is notable for diabetes.  I would like to test for treatable causes of neuropathy. I discussed that in the vast majority of cases, despite checking for reversible causes, we are unable to find the underlying etiology and management is symptomatic.  As symptoms are predominately numbness, there is no role for neuralgesic medications unless she develops painful paresthesias.    PLAN/RECOMMENDATIONS:  1.  Check two glucose tolerance, copper, SPEP/UPEP with IFE 2.  Telephone update with results 3.  EMG  if symptoms worsen or change in a manner atypical for neuropathy 4.  Return to clinic if symptoms worsen   The duration of this appointment visit was 40 minutes of face-to-face time with the patient.  Greater than 50% of this time was spent in counseling, explanation of diagnosis, planning of further management, and coordination of care.   Thank you for allowing me to participate in patient's care.  If I can answer any additional questions, I would be pleased to do so.     Sincerely,    Donika K. Allena Katz, DO

## 2014-09-25 NOTE — Patient Instructions (Addendum)
1.  Check two glucose tolerance, copper, SPEP/UPEP with IFE 2.  We will call you with the results 3.  Return to clinic as needed

## 2014-09-28 ENCOUNTER — Encounter: Payer: Self-pay | Admitting: Family Medicine

## 2014-09-28 ENCOUNTER — Ambulatory Visit (INDEPENDENT_AMBULATORY_CARE_PROVIDER_SITE_OTHER): Payer: Medicare Other | Admitting: Family Medicine

## 2014-09-28 VITALS — BP 110/70 | Temp 98.5°F | Ht 64.0 in | Wt 102.2 lb

## 2014-09-28 DIAGNOSIS — R300 Dysuria: Secondary | ICD-10-CM

## 2014-09-28 DIAGNOSIS — N3000 Acute cystitis without hematuria: Secondary | ICD-10-CM

## 2014-09-28 LAB — POCT URINALYSIS DIPSTICK

## 2014-09-28 MED ORDER — CEFPROZIL 500 MG PO TABS: 500.0000 mg | ORAL_TABLET | Freq: Two times a day (BID) | ORAL | Status: AC

## 2014-09-28 NOTE — Progress Notes (Signed)
   Subjective:    Patient ID: Mary [name], female    DOB: 05-30-1945, 69 y.o.   MRN: [id number]  Urinary Tract Infection  This is a new problem. The current episode started in the past 7 days. The problem occurs every urination. The problem has been unchanged. The quality of the pain is described as burning. The pain is moderate. There has been no fever. Associated symptoms include frequency and urgency. Pertinent negatives include no flank pain. Treatments tried: AZO. The treatment provided mild relief.   Patient states that she has no other concerns at this time.    Review of Systems  Constitutional: Negative for fever and fatigue.  HENT: Negative for congestion.   Gastrointestinal: Negative for abdominal pain.  Genitourinary: Positive for urgency and frequency. Negative for dysuria and flank pain.       Objective:   Physical Exam  Constitutional: She appears well-nourished. No distress.  Cardiovascular: Normal rate, regular rhythm and normal heart sounds.   No murmur heard. Pulmonary/Chest: Effort normal and breath sounds normal. No respiratory distress.  Abdominal: Soft.  Musculoskeletal: She exhibits no edema.  Neurological: She is alert. She exhibits normal muscle tone.  Psychiatric: Her behavior is normal.  Vitals reviewed.         Assessment & Plan:  Dysuria/UTI-antibiotics prescribed warning signs discussed follow-up if culture urine culture pending

## 2014-09-28 NOTE — Patient Instructions (Signed)

## 2014-09-30 LAB — URINE CULTURE

## 2014-10-07 ENCOUNTER — Ambulatory Visit (INDEPENDENT_AMBULATORY_CARE_PROVIDER_SITE_OTHER): Payer: Medicare Other

## 2014-10-07 DIAGNOSIS — J309 Allergic rhinitis, unspecified: Secondary | ICD-10-CM | POA: Diagnosis not present

## 2014-10-09 DIAGNOSIS — G609 Hereditary and idiopathic neuropathy, unspecified: Secondary | ICD-10-CM | POA: Diagnosis not present

## 2014-10-11 LAB — COPPER, SERUM: COPPER: 120 ug/dL (ref 70–175)

## 2014-10-12 ENCOUNTER — Telehealth: Payer: Self-pay | Admitting: Internal Medicine

## 2014-10-12 ENCOUNTER — Encounter: Payer: Self-pay | Admitting: Family Medicine

## 2014-10-12 ENCOUNTER — Ambulatory Visit (INDEPENDENT_AMBULATORY_CARE_PROVIDER_SITE_OTHER): Payer: Medicare Other | Admitting: Family Medicine

## 2014-10-12 VITALS — BP 122/74 | Temp 98.1°F | Ht 64.0 in | Wt 104.2 lb

## 2014-10-12 DIAGNOSIS — N3000 Acute cystitis without hematuria: Secondary | ICD-10-CM | POA: Diagnosis not present

## 2014-10-12 DIAGNOSIS — R309 Painful micturition, unspecified: Secondary | ICD-10-CM | POA: Diagnosis not present

## 2014-10-12 MED ORDER — CIPROFLOXACIN HCL 250 MG PO TABS: 250.0000 mg | ORAL_TABLET | Freq: Two times a day (BID) | ORAL | Status: DC

## 2014-10-12 NOTE — Telephone Encounter (Signed)
Date Mixed: 10/12/2014 Vial: AB Strength: 1:10 Here/Mail/Pick Up: Here Mixed By: Jaynee EaglesLindsay Lemons, CMA

## 2014-10-12 NOTE — Progress Notes (Signed)
   Subjective:    Patient ID: Mary [name], female    DOB: Mar 06, 1946, 69 y.o.   MRN: [id number]  Urinary Tract Infection  This is a new problem. The current episode started in the past 7 days. The problem has been unchanged. The quality of the pain is described as burning, shooting and stabbing. The pain is moderate. Associated symptoms include hesitancy. She has tried increased fluids (AZO, ) for the symptoms. The treatment provided no relief.   Patient has c/o painful urination with pressure. Patient states no other concerns this visit. Positive history of frequent UTIs. Had bladder "stretched" many years ago and it seemed to help. Realizes they don't do that as much now Frequent urin and discomfort  Review of Systems  Genitourinary: Positive for hesitancy.   no fever no chills no nausea     Objective:   Physical Exam Alert vitals stable no acute distress. Lungs clear. Heart regular in rhythm. H&T normal. No CVA tenderness.   Numerous white blood cells per high-power field    Assessment & Plan:  Impression 1 urinary tract infection discussed plan Cipro twice a day 7 days. Continue Azo-Standard. Culture urine. WSL

## 2014-10-13 ENCOUNTER — Ambulatory Visit (INDEPENDENT_AMBULATORY_CARE_PROVIDER_SITE_OTHER): Payer: Medicare Other

## 2014-10-13 DIAGNOSIS — J309 Allergic rhinitis, unspecified: Secondary | ICD-10-CM

## 2014-10-13 LAB — SPEP & IFE WITH QIG
Albumin ELP: 3.8 g/dL (ref 3.8–4.8)
Alpha-1-Globulin: 0.2 g/dL (ref 0.2–0.3)
Alpha-2-Globulin: 0.7 g/dL (ref 0.5–0.9)
Beta 2: 0.4 g/dL (ref 0.2–0.5)
Beta Globulin: 0.5 g/dL (ref 0.4–0.6)
Gamma Globulin: 0.8 g/dL (ref 0.8–1.7)
IGG (IMMUNOGLOBIN G), SERUM: 760 mg/dL (ref 690–1700)
IGM, SERUM: 61 mg/dL (ref 52–322)
IgA: 365 mg/dL (ref 69–380)
TOTAL PROTEIN, SERUM ELECTROPHOR: 6.3 g/dL (ref 6.1–8.1)

## 2014-10-14 LAB — URINE CULTURE

## 2014-10-16 LAB — GLUCOSE TOLERANCE, 2 HOURS W/ 1HR
GLUCOSE, 2 HOUR: 136 mg/dL (ref 70–139)
GLUCOSE: 169 mg/dL (ref 70–170)
Glucose, Fasting: 80 mg/dL (ref 70–99)

## 2014-10-17 DIAGNOSIS — G609 Hereditary and idiopathic neuropathy, unspecified: Secondary | ICD-10-CM | POA: Diagnosis not present

## 2014-10-21 ENCOUNTER — Ambulatory Visit (INDEPENDENT_AMBULATORY_CARE_PROVIDER_SITE_OTHER): Payer: Medicare Other

## 2014-10-21 DIAGNOSIS — J309 Allergic rhinitis, unspecified: Secondary | ICD-10-CM

## 2014-10-25 ENCOUNTER — Other Ambulatory Visit: Payer: Self-pay | Admitting: Family Medicine

## 2014-10-26 ENCOUNTER — Encounter: Payer: Self-pay | Admitting: Internal Medicine

## 2014-10-28 ENCOUNTER — Ambulatory Visit: Payer: Medicare Other

## 2014-11-04 ENCOUNTER — Ambulatory Visit (INDEPENDENT_AMBULATORY_CARE_PROVIDER_SITE_OTHER): Payer: Medicare Other

## 2014-11-04 DIAGNOSIS — J309 Allergic rhinitis, unspecified: Secondary | ICD-10-CM | POA: Diagnosis not present

## 2014-11-16 ENCOUNTER — Telehealth: Payer: Self-pay | Admitting: Neurology

## 2014-11-16 NOTE — Telephone Encounter (Signed)
Patient made aware all labs drawn in June/July were normal.

## 2014-11-16 NOTE — Telephone Encounter (Signed)
Called needing her Urine test results/ call back @ [phone]

## 2014-11-17 ENCOUNTER — Ambulatory Visit: Payer: Medicare Other | Admitting: Emergency Medicine

## 2014-11-18 ENCOUNTER — Ambulatory Visit (INDEPENDENT_AMBULATORY_CARE_PROVIDER_SITE_OTHER): Payer: Medicare Other

## 2014-11-18 DIAGNOSIS — J309 Allergic rhinitis, unspecified: Secondary | ICD-10-CM

## 2014-11-27 ENCOUNTER — Encounter: Payer: Self-pay | Admitting: Internal Medicine

## 2014-12-02 ENCOUNTER — Encounter: Payer: Self-pay | Admitting: Emergency Medicine

## 2014-12-02 ENCOUNTER — Ambulatory Visit (INDEPENDENT_AMBULATORY_CARE_PROVIDER_SITE_OTHER): Payer: Medicare Other

## 2014-12-02 ENCOUNTER — Ambulatory Visit (INDEPENDENT_AMBULATORY_CARE_PROVIDER_SITE_OTHER): Payer: Medicare Other | Admitting: Emergency Medicine

## 2014-12-02 VITALS — BP 102/62 | HR 70 | Ht 64.5 in | Wt 103.0 lb

## 2014-12-02 DIAGNOSIS — R05 Cough: Secondary | ICD-10-CM | POA: Diagnosis not present

## 2014-12-02 DIAGNOSIS — R059 Cough, unspecified: Secondary | ICD-10-CM

## 2014-12-02 DIAGNOSIS — J309 Allergic rhinitis, unspecified: Secondary | ICD-10-CM

## 2014-12-02 NOTE — Assessment & Plan Note (Signed)
Seems to be at a quiet period for now, likely due to the time of year. Consider also any impact of the Symbicort that we just trialed. I'll like to stop the Symbicort, see if her cough or breathing change. If so we may consider restarting versus an ICS alone.  She is currently tolerating being off her Allegra, Nasacort, and omeprazole. We need to have a low threshold to restart these as her symptoms change

## 2014-12-02 NOTE — Patient Instructions (Signed)
Please stop Symbicort. Pain attention to your symptoms and let us know if your cough or breathing change off the medicine.  Remember to restart Nasacort and Allegra at the beginning of spring.  We may also consider restarting omeprazole (Prilosec) in the future depending on the trend of your cough Get your flu shot this fall Follow with Dr Delton Coombes in 6 months or sooner if you have any problems

## 2014-12-02 NOTE — Progress Notes (Signed)
Patient ID: Mary [name], female    DOB: [birth year], 69 y.o.   MRN: [id number] HPI Ms. Mowrer is a 69 year old woman whom I have followed for chronic cough. 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 Evaluated 2011 by Dr Maple Hudson for allergies - shows multiple sensitivities that are present year round.   ROV 07/12/09 -- returns for f/u. Tells me that her PND has actually improved since last visit. Her regimen is benadryl  two times a day, NSW two times a day, flutter valve two times a day - sometimes productive but often not. Also on rotating doxy + azithromycin. No flares of bronchiectasis since last visit. She is contemplating starting allergy shots, hopefully can get them from her PCP.   ROV 10/01/09 -- f/u bronchiectasis, cough. Flare that started about a week ago. Began treatment with Avelox and set up this follow up. Was exposed to husband who had a URI. Changed her benadryl to Claritin D. She has not started allergy shots yet. Starting to feel better, not back to baseline. never started Singulair.   ROV 01/24/10 -- Hx chronic cough, bronchiectasis. Tried daliresp, couldn't tolerate due to insomnia. Still with clear drainage, never responded to treatment with nasal steroid. On benadryl qhs. Taking alternating doxy and azithro monthly. Considering starting allergy shots - discussing w Dr Maple Hudson.   ROV 08/05/10 -- bronchiectasis, chronic cough, severe allergic rhinitis. Doing NSW qd and flutter qd.  She is on allergy shots and nasal gtt may be better. She is alternating doxy with azithro at the beginning of every month. Her cough is predicatble - morning and afternoon. Worst if she has been off abx for an extended period. Last CT scan was 2008.   10/19/10-  69 yo F  Never smoker followed for bronchiectasis/ hx M.avium, chronic cough, severe allergic rhinitis Started allergy vaccine in January, now at 1:50 with occasional minimal  local soreness but no problems. Says the spring pollen season was "rough"- increased nasal stuffiness and cough. Has been better in last 2 months. Still takes benadryl once each night. Hx of trying many nasal sprays. Still some nasal congestion and drip at times. She is interested in getting her shots in [place] at her PCP. allergy profile- IgE 2.4, negative common allergens. Negative Fungal antibody panel  01/20/11-  69 yo F  Never smoker followed for bronchiectasis/ hx M.avium, chronic cough (Dr Delton Coombes), severe allergic rhinitis She returns for followup of her allergy vaccine therapy begun in January 2012. She says she had 2 good weeks at the beginning of September but now feels nasal congestion in the morning with watery rhinorrhea later in the day "like a cold". She has not been taking antihistamines very often, but prefers Benadryl. Cough is productive of clear to yellow sputum with no blood or fever. Dr Delton Coombes is alternating antibiotics. Flu shot is pending with her primary physician  ROV 05/05/11 -- bronchiectasis, chronic cough, severe allergic rhinitis. Hx MAIC and also MRSA. She has started allergy shots w Dr Maple Hudson. She still has congestion and drainage, assoc w dry cough. She is alternating abx (doxy/azithro) beginning every month. The cough and sputum is bad between abx and she often has to start early. Doxy is better than azithro, but it is taking longer for it to be effective. She is wondering if she might be having some reflux.   ROV 06/21/11 -- bronchiectasis w hx MAIC + MRSA, chronic cough both due to bronchiectasis and to  UA irritation, severe allergic rhinitis. Returns after sputum cx >> shows MAIC, sensitivities not yet available. She continues to have cough that is most productive in the am and at the end of the day.   ROV 08/04/11 -- bronchiectasis w hx MAIC + MRSA, chronic cough both due to bronchiectasis and to UA irritation, severe allergic rhinitis. We [facility] clarithro [facility]  [facility] started it on 4/15. She has the bitter taste in her mouth, had this last time. I had thought this was due to rifabutin, but possibly due to clarithro. Not currently on doxy. Since last time she thinks she had URI, now probably back to baseline.   ROV 11/13/11 -- bronchiectasis w hx MAIC + MRSA, chronic cough both due to bronchiectasis and to UA irritation, severe allergic rhinitis. Has been on clarithro/ethambutol since 07/17/11. She has less energy on the abx. She continues to have cough that is productive of either green or clear. The cough is most bothersome at night, can wake her from sleep. She coughs every morning - clears secretions. Then around mid-day she has another bout, then again in the evening.    ROV 01/22/12 -- bronchiectasis w hx MAIC + MRSA, chronic cough both due to bronchiectasis and to UA irritation, severe allergic rhinitis. [facility] '[facility]. She finished the abx 01/12/12 (6 months). She continues to have cough and UA irritation.  Her nasal gtt is better but not gone; has never benefited from nasal steroid, astelin. Needs repeat CT scan 08/2012.   07/03/12- 69 yo F  Never smoker followed for bronchiectasis/ hx M.avium, chronic cough (Dr Delton CoombesByrum), severe allergic rhinitis FOLLOWS FOR: sneezing and runny nose; cough and drainage as well. Still on allergy vaccine Missed her allergy shots 1:10 GH for month of March after fall with cracked pelvis.  Incvreased sneeze, watery rhinorhea, much post-nasal drip. No headache, ears, purulent. Blames PND for increased cough from her bronchiectasis.  Taking a Sudafed-PE product containing an antihistamine, nasal saline irrigation.  ROV 07/24/12 -- bronchiectasis w hx MAIC + MRSA, chronic cough both due to bronchiectasis and to UA irritation, severe allergic rhinitis. She is on immunotherapy per Dr Maple HudsonYoung, doing NSW's. Had never benefited from nasal steroid or astelin in the past. Started dymista  on 4/2 from dr Maple HudsonYoung. Also had Ct scan sinuses > all clear 07/16/12.  She unfortunately fell and broke her pelvis in early March. Her cough continues, still yellow/green/brown. Occasionally some blood from nose or sputum. Breathing stable. Not currently on rotating abx.   ROV 09/25/12 -- bronchiectasis w hx MAIC + MRSA, chronic cough both due to bronchiectasis and to UA irritation, severe allergic rhinitis. We restarted rotating abx after last visit. She notes that sputum is no longer as colored, still thick. Alternating doxy and clarithro.  She has also been on Keflex long term for recurrent UTI's. She is back on allergy shots.   ROV 04/09/13 -- bronchiectasis w hx MAIC + MRSA, chronic cough both due to bronchiectasis and to UA irritation, severe allergic rhinitis. She has followed with Dr Maple HudsonYoung and receiving allergy shots. She has persistent sx, but recently also had a URI which made everything even worse. She has noticed that she got a skin rash associated with her doxycycline. She is having some problems swallowing - things getting hung up in he throat, difficulty taking clarithro.   ROV 05/21/13 -- bronchiectasis w hx MAIC + MRSA, chronic cough both due to bronchiectasis and to  UA irritation, severe allergic rhinitis. Last time treated with levaquin, stopped rotating abx. She has continued persistent cough, every day all day. Often not productive. She has seen some blood in sputum for last 2 weeks. Her barium study showed a zenker's diverticulum, small HH, some dilation. She has at times coughed enough to bring up small food particles, but she distinguishes this from true emesis. She tried albuterol some - mixed results.   ROV 09/10/13 -- Bronchiectasis w hx MAIC + MRSA, chronic cough both due to bronchiectasis and to UA irritation, severe allergic rhinitis. She is on max therapy for allergies including immunotherapy.  She recently underwent zenker's diverticulotomy on 08/01/13 with Dr Jenne Pane. She has improved  remarkably. She does still cough some, less productive and much less frequent. She is off nasacort, omeprazole, allergra right now.   ROV 12/03/13 -- Bronchiectasis w hx MAIC + MRSA, chronic cough both due to bronchiectasis and to UA irritation, severe allergic rhinitis.  She has been coughing more and seeing darker mucous, often with blood. She is not on abx right now.     ROV 04/01/14 -- f/u visit for bronchiectasis. She was treated with levaquin last time for a flare. She is back to her baseline - coughs for 30 minutes in the am, then around 1pm she starts up again for the rest of the day. She is still seeing blood. She describes some swallowing problems > takes 2 swallows to get her food down, sometimes lodges. She has a hx Zenker's diverticulum and repair.   ROV 09/09/14 -- follow-up visit for bronchiectasis and chronic cough that is likely multifactorial. 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 On 07/22/14 she underwent a barium swallow which I have reviewed today. It showed no change in her lower cervical lateral esophageal diverticulum. Some residual barium remained in that diverticulum. She has a small hiatal hernia and some associated GERD. She has seen Dr Jenne Pane, tried increased dose prilosec and she saw some improvement. She is currently off. She coughs in the am, clears mucous. Then she has more cough around lunch. She is on anti- histamine and a nasal steroid  ROV 12/02/14 -- follow up visit for chronic cough, bronchiectasis, GERD and hiatal hernia.   Last visit we started a trial of Symbicort, to see if she would benefit. Her cough is better, but unclear whether this is because she was improving already. Not sure we can ascribe to the symbicort. She is on Actuary. No change in exertional tolerance. Has intermittent nasal congestion.     Filed Vitals:   12/02/14 1330  BP: 102/62  Pulse: 70  Height: 5' 4.5" (1.638 m)  Weight: 103 lb (46.72  kg)  SpO2: 96%   Gen: Pleasant, well-nourished, in no distress,  normal affect  ENT: No lesions,  mouth clear,  oropharynx clear, no postnasal drip  Neck: No JVD, no TMG, no carotid bruits  Lungs: No use of accessory muscles, no dullness to percussion, clear without rales or rhonchi  Cardiovascular: RRR, heart sounds normal, no murmur or gallops, no peripheral edema  Musculoskeletal: No deformities, no cyanosis or clubbing  Neuro: alert, non focal  Skin: Warm, no lesions or rashes    COUGH, CHRONIC Seems to be at a quiet period for now, likely due to the time of year. Consider also any impact of the Symbicort that we just trialed. I'll like to stop the Symbicort, see if her cough or breathing change.  If so we may consider restarting versus an ICS alone.  She is currently tolerating being off her Allegra, Nasacort, and omeprazole. We need to have a low threshold to restart these as her symptoms change

## 2014-12-16 ENCOUNTER — Ambulatory Visit (INDEPENDENT_AMBULATORY_CARE_PROVIDER_SITE_OTHER): Payer: Medicare Other

## 2014-12-16 DIAGNOSIS — J309 Allergic rhinitis, unspecified: Secondary | ICD-10-CM | POA: Diagnosis not present

## 2014-12-30 ENCOUNTER — Ambulatory Visit (INDEPENDENT_AMBULATORY_CARE_PROVIDER_SITE_OTHER): Payer: Medicare Other

## 2014-12-30 DIAGNOSIS — J309 Allergic rhinitis, unspecified: Secondary | ICD-10-CM | POA: Diagnosis not present

## 2015-01-06 ENCOUNTER — Ambulatory Visit (INDEPENDENT_AMBULATORY_CARE_PROVIDER_SITE_OTHER): Payer: Medicare Other | Admitting: Internal Medicine

## 2015-01-06 ENCOUNTER — Encounter: Payer: Self-pay | Admitting: Internal Medicine

## 2015-01-06 ENCOUNTER — Other Ambulatory Visit: Payer: Medicare Other

## 2015-01-06 VITALS — BP 106/68 | HR 77 | Ht 64.5 in | Wt 103.2 lb

## 2015-01-06 DIAGNOSIS — J42 Unspecified chronic bronchitis: Secondary | ICD-10-CM

## 2015-01-06 DIAGNOSIS — A310 Pulmonary mycobacterial infection: Secondary | ICD-10-CM

## 2015-01-06 DIAGNOSIS — J301 Allergic rhinitis due to pollen: Secondary | ICD-10-CM

## 2015-01-06 DIAGNOSIS — J479 Bronchiectasis, uncomplicated: Secondary | ICD-10-CM | POA: Diagnosis not present

## 2015-01-06 DIAGNOSIS — J309 Allergic rhinitis, unspecified: Secondary | ICD-10-CM

## 2015-01-06 MED ORDER — AZITHROMYCIN 250 MG PO TABS: ORAL_TABLET | ORAL | Status: DC

## 2015-01-06 NOTE — Patient Instructions (Addendum)
Don't forget that flu shot  Script sent for Z pak  Order- lab- Allergy profile dx chronic bronchitis, allergic rhinitis

## 2015-01-06 NOTE — Assessment & Plan Note (Signed)
She has chosen to continue allergy vaccine 1:10 GH which is well tolerated. She understands that this is only partially and allergic phenomena with a nonallergic/vasomotor component. Plan-continue allergy vaccine

## 2015-01-06 NOTE — Assessment & Plan Note (Signed)
Acute exacerbation of bronchitis/bronchiectasis are to be sure there is not a bacterial component. Plan-Z-Pak, fluids vaccine

## 2015-01-06 NOTE — Progress Notes (Signed)
Patient ID: LARIYAH SHETTERLY, female    DOB: 11/13/1945, 69 y.o.   MRN: [id number] HPI [name] is a 69 year old woman whom I have followed for chronic cough. 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 Evaluated 2011 by Dr Maple Hudson for allergies - shows multiple sensitivities that are present year round.   ROV 07/12/09 -- returns for f/u. Tells me that her PND has actually improved since last visit. Her regimen is benadryl  two times a day, NSW two times a day, flutter valve two times a day - sometimes productive but often not. Also on rotating doxy + azithromycin. No flares of bronchiectasis since last visit. She is contemplating starting allergy shots, hopefully can get them from her PCP.   ROV 10/01/09 -- f/u bronchiectasis, cough. Flare that started about a week ago. Began treatment with Avelox and set up this follow up. Was exposed to husband who had a URI. Changed her benadryl to Claritin D. She has not started allergy shots yet. Starting to feel better, not back to baseline. never started Singulair.   ROV 01/24/10 -- Hx chronic cough, bronchiectasis. Tried daliresp, couldn't tolerate due to insomnia. Still with clear drainage, never responded to treatment with nasal steroid. On benadryl qhs. Taking alternating doxy and azithro monthly. Considering starting allergy shots - discussing w Dr Maple Hudson.   ROV 08/05/10 -- bronchiectasis, chronic cough, severe allergic rhinitis. Doing NSW qd and flutter qd.  She is on allergy shots and nasal gtt may be better. She is alternating doxy with azithro at the beginning of every month. Her cough is predicatble - morning and afternoon. Worst if she has been off abx for an extended period. Last CT scan was 2008.   10/19/10-  30 yo F  Never smoker followed for bronchiectasis/ hx M.avium, chronic cough, severe allergic rhinitis Started allergy vaccine in January, now at 1:50 with occasional minimal  local soreness but no problems. Says the spring pollen season was "rough"- increased nasal stuffiness and cough. Has been better in last 2 months. Still takes benadryl once each night. Hx of trying many nasal sprays. Still some nasal congestion and drip at times. She is interested in getting her shots in [place] at her PCP. allergy profile- IgE 2.4, negative common allergens. Negative Fungal antibody panel  01/20/11-  38 yo F  Never smoker followed for bronchiectasis/ hx M.avium, chronic cough (Dr Delton Coombes), severe allergic rhinitis She returns for followup of her allergy vaccine therapy begun in January 2012. She says she had 2 good weeks at the beginning of September but now feels nasal congestion in the morning with watery rhinorrhea later in the day "like a cold". She has not been taking antihistamines very often, but prefers Benadryl. Cough is productive of clear to yellow sputum with no blood or fever. Dr Delton Coombes is alternating antibiotics. Flu shot is pending with her primary physician  ROV 05/05/11 -- bronchiectasis, chronic cough, severe allergic rhinitis. Hx MAIC and also MRSA. She has started allergy shots w Dr Maple Hudson. She still has congestion and drainage, assoc w dry cough. She is alternating abx (doxy/azithro) beginning every month. The cough and sputum is bad between abx and she often has to start early. Doxy is better than azithro, but it is taking longer for it to be effective. She is wondering if she might be having some reflux.   ROV 06/21/11 -- bronchiectasis w hx MAIC + MRSA, chronic cough both due to bronchiectasis and to  UA irritation, severe allergic rhinitis. Returns after sputum cx >> shows MAIC, sensitivities not yet available. She continues to have cough that is most productive in the am and at the end of the day.   ROV 08/04/11 -- bronchiectasis w hx MAIC + MRSA, chronic cough both due to bronchiectasis and to UA irritation, severe allergic rhinitis. We started clarithro + ethambutol  for her [facility], started it on 4/15. She has the bitter taste in her mouth, had this last time. I had thought this was due to rifabutin, but possibly due to clarithro. Not currently on doxy. Since last time she thinks she had URI, now probably back to baseline.   ROV 11/13/11 -- bronchiectasis w hx MAIC + MRSA, chronic cough both due to bronchiectasis and to UA irritation, severe allergic rhinitis. Has been on clarithro/ethambutol since 07/17/11. She has less energy on the abx. She continues to have cough that is productive of either green or clear. The cough is most bothersome at night, can wake her from sleep. She coughs every morning - clears secretions. Then around mid-day she has another bout, then again in the evening.    ROV 01/22/12 -- bronchiectasis w hx MAIC + MRSA, chronic cough both due to bronchiectasis and to UA irritation, severe allergic rhinitis. We treated [facility] with erythromycin and ethambutol since April '13. She finished the abx 01/12/12 (6 months). She continues to have cough and UA irritation.  Her nasal gtt is better but not gone; has never benefited from nasal steroid, astelin. Needs repeat CT scan 08/2012.   07/03/12- 65 yo F  Never smoker followed for bronchiectasis/ hx M.avium, chronic cough (Dr Delton Coombes), severe allergic rhinitis FOLLOWS FOR: sneezing and runny nose; cough and drainage as well. Still on allergy vaccine Missed her allergy shots 1:10 GH for month of March after fall with cracked pelvis.  Incvreased sneeze, watery rhinorhea, much post-nasal drip. No headache, ears, purulent. Blames PND for increased cough from her bronchiectasis.  Taking a Sudafed-PE product containing an antihistamine, nasal saline irrigation.  ROV 07/24/12 -- bronchiectasis w hx MAIC + MRSA, chronic cough both due to bronchiectasis and to UA irritation, severe allergic rhinitis. She is on immunotherapy per Dr Maple Hudson, doing NSW's. Had never benefited from nasal steroid or astelin in the past. Started dymista  on 4/2 from dr Maple Hudson. Also had Ct scan sinuses > all clear 07/16/12.  She unfortunately fell and broke her pelvis in early March. Her cough continues, still yellow/green/brown. Occasionally some blood from nose or sputum. Breathing stable. Not currently on rotating abx.   ROV 09/25/12 -- bronchiectasis w hx MAIC + MRSA, chronic cough both due to bronchiectasis and to UA irritation, severe allergic rhinitis. We restarted rotating abx after last visit. She notes that sputum is no longer as colored, still thick. Alternating doxy and clarithro.  She has also been on Keflex long term for recurrent UTI's. Sh eis back on allergy shots.   910/1/14-  71 yo F  Never smoker followed for bronchiectasis/ hx M.avium, chronic cough (Dr Delton Coombes), severe allergic/ vasomotor rhinitis ( Dr Maple Hudson) 07/16/12 CT scan sinuses Comparison: 12/24/2006 and earlier.  Findings: Negative visualized noncontrast brain parenchyma, orbit  soft tissues, and face soft tissues.  Visualized mastoids and tympanic cavities are clear.  Hyperplastic sphenoid sinuses remain clear.  Ethmoid air cells remain clear.  Frontal sinuses remain clear.  Maxillary sinuses remain clear. Both OMCs are patent.  Chronic rightward nasal septal deviation. No acute osseous  abnormality identified.  IMPRESSION:  Stable and clear paranasal sinuses. BRONCHIECTASIS - continue allergy regimen - continue inhaled meds - repeat CT scan chest to compare w priors, will call her to discuss results - rov 6 mon  01/01/13- 78 yo F  Never smoker followed for bronchiectasis/ hx M.avium, chronic cough (Dr Delton Coombes), severe allergic/ vasomotor rhinitis ( Dr Maple Hudson) FOLLOWS FOR:  Symptoms unchanged since last OV.  Still with cough, runny nose and sneezing Get flu vaccine at PCP. Continues Allergy vaccine 1:10 GH Perennial cough sneeze and drainage. Failed steroid nasal spray. Doesn't recall either Dymista or ipratropium.  07/02/13- 43 yo F  Never smoker followed for  bronchiectasis/ hx M.avium, chronic cough (Dr Delton Coombes), severe allergic/ vasomotor rhinitis ( Dr Maple Hudson) FOLLOWS FOR:  Allergy Vaccine 1:10 GH doing well.  Still having symptoms of PND, sinus pressure and increased sneezing She is having some cough and nasal discharge now in the middle of peak spring pollen. Not clear how important allergy vaccine is currently. We discussed option to stop. Cough productive of thick mucus is unchanged. She continues treatment for Mycobacterium avium. She is not sure how much of her cough is due to postnasal drip.  12/31/13- 87 yo F  Never smoker followed for bronchiectasis/ hx M.avium, chronic cough (Dr Delton Coombes), severe allergic/ vasomotor rhinitis ( Dr Maple Hudson), complicated by GERD/ aspiration/repair Zencker's Allergy Vaccine 1:10 GH   FOLLOWS FOR: still on vaccine and doing well.  Silent aspiration was identified during w/u for Zenker's divertic in Spring, 2015..   01/06/15-  31 yo F  Never smoker followed for bronchiectasis/ hx M.avium, chronic cough (Dr Delton Coombes), severe allergic/ vasomotor rhinitis ( Dr Maple Hudson), complicated by GERD/ aspiration/repair Zencker's Allergy Vaccine 1:10 GH   FOLLOW FOR: Allergies; coughing and runny nose, allergies are worse.  Having low grade fever in the afternoons. coughing up brown mucus.   BA SWALLOW 4/16 AGAIN SHOWED ESOPHAGEAL DIVERTICULUM, SLIGHT REFLUX. Did well in August. Cough got worse in September. Last night she was having to get up because of hard coughing with scant sputum. Some frontal sinus pressure without pain or fever.  ROS-see HPI Constitutional:   No-   weight loss, night sweats, fevers, chills, fatigue, lassitude. HEENT:   No-  headaches, difficulty swallowing, tooth/dental problems, sore throat,       + sneezing, No-itching, ear ache, +nasal congestion, +post nasal drip,  CV:  No-   chest pain, orthopnea, PND, swelling in lower extremities, anasarca, dizziness, palpitations Resp: No-   shortness of breath with exertion  or at rest.             +productive cough,  + non-productive cough,  No- coughing up of blood.              No-   change in color of mucus.  No- wheezing.   Skin: No-   rash or lesions. GI:  No-   heartburn, indigestion, abdominal pain, nausea, vomiting, GU:  MS:  No-   joint pain or swelling. Neuro-     nothing unusual Psych:  No- change in mood or affect. No depression or anxiety.  No memory loss.  OBJ- Physical Exam General- Alert, Oriented, Affect-appropriate, Distress- none acute Skin- rash-none, lesions- none, excoriation- none Lymphadenopathy- none Head- atraumatic            Eyes- Gross vision intact, PERRLA, conjunctivae and secretions clear            Ears- Hearing, canals-normal  Nose- +watery, + R Septal dev, polyps, erosion, perforation             Throat- Mallampati II , mucosa clear , drainage- none, tonsils- atrophic Neck- flexible , trachea midline, no stridor , thyroid nl, carotid no bruit Chest - symmetrical excursion , unlabored           Heart/CV- RRR , no murmur , no gallop  , no rub, nl s1 s2                           - JVD- none , edema- none, stasis changes- none, varices- none           Lung- + minimal crackles, wheeze- none, cough- none , dullness-none, rub- none           Chest wall-  Abd-  Br/ Gen/ Rectal- Not done, not indicated Extrem- cyanosis- none, clubbing, none, atrophy- none, strength- nl Neuro- grossly intact to observation

## 2015-01-06 NOTE — Assessment & Plan Note (Signed)
Watch for progression that would indicate need for new culture

## 2015-01-07 LAB — ALLERGY FULL PROFILE
Allergen, D pternoyssinus,d7: <0.1 kU/L
Allergen,Goose feathers, e70: <0.1 kU/L
Aspergillus fumigatus, m3: <0.1 kU/L
Candida Albicans: <0.1 kU/L
Common Ragweed: <0.1 kU/L
Curvularia lunata: <0.1 kU/L
D. farinae: <0.1 kU/L
Goldenrod: <0.1 kU/L
IgE (Immunoglobulin E), Serum: 5 kU/L (ref <115)
Plantain: <0.1 kU/L
Stemphylium Botryosum: <0.1 kU/L
Timothy Grass: <0.1 kU/L

## 2015-01-08 ENCOUNTER — Telehealth: Payer: Self-pay | Admitting: Internal Medicine

## 2015-01-08 NOTE — Telephone Encounter (Signed)
Pt informed of lab results per Dr Young 

## 2015-01-20 ENCOUNTER — Ambulatory Visit: Payer: Medicare Other

## 2015-01-27 ENCOUNTER — Ambulatory Visit (INDEPENDENT_AMBULATORY_CARE_PROVIDER_SITE_OTHER): Payer: Medicare Other

## 2015-01-27 DIAGNOSIS — J309 Allergic rhinitis, unspecified: Secondary | ICD-10-CM

## 2015-01-29 ENCOUNTER — Ambulatory Visit (INDEPENDENT_AMBULATORY_CARE_PROVIDER_SITE_OTHER): Payer: Medicare Other | Admitting: Nurse Practitioner

## 2015-01-29 VITALS — BP 130/70 | Temp 98.3°F | Ht 64.0 in | Wt 103.5 lb

## 2015-01-29 DIAGNOSIS — J302 Other seasonal allergic rhinitis: Secondary | ICD-10-CM | POA: Diagnosis not present

## 2015-01-29 DIAGNOSIS — J300 Vasomotor rhinitis: Secondary | ICD-10-CM

## 2015-01-29 MED ORDER — METHYLPREDNISOLONE ACETATE 40 MG/ML IJ SUSP
40.0000 mg | Freq: Once | INTRAMUSCULAR | Status: AC
  Administered 2015-01-29: 40 mg via INTRAMUSCULAR

## 2015-02-01 ENCOUNTER — Encounter: Payer: Self-pay | Admitting: Nurse Practitioner

## 2015-02-01 NOTE — Progress Notes (Signed)
Subjective:  Presents with complaints of coughing and head congestion over the past 2 weeks. Was prescribed a Z-Pak on 10/5 by her allergy specialist with minimal improvement. No fever. Facial area pressure at times. Has a history of bronchiectasis, chronic persistent cough with no change. Regular follow-up with her pulmonologist. Runny nose. Ear popping. Irritated throat. Postnasal drainage. Currently on Nasonex and Allegra.  Objective:   BP 130/70 mmHg  Temp(Src) 98.3 F (36.8 C) (Oral)  Ht 5\' 4"  (1.626 m)  Wt 103 lb 8 oz (46.947 kg)  BMI 17.76 kg/m2 NAD. Alert, oriented. TMs clear effusion, no erythema. Nasal mucosa pale and mildly boggy. Pharynx injected with cloudy PND noted. Neck supple with mild soft anterior adenopathy. Lungs breath sounds mildly diminished but clear. Heart regular rate rhythm. No tachypnea. Normal color.  Assessment: Vasomotor rhinitis - Plan: methylPREDNISolone acetate (DEPO-MEDROL) injection 40 mg  Other seasonal allergic rhinitis - Plan: methylPREDNISolone acetate (DEPO-MEDROL) injection 40 mg  Plan:  Meds ordered this encounter  Medications  . methylPREDNISolone acetate (DEPO-MEDROL) injection 40 mg    Sig:    Continue Nasonex and Allegra as directed. Call back early next week if no improvement, sooner if worse. Warning signs reviewed. No further antibiotics at this time.

## 2015-02-05 ENCOUNTER — Telehealth: Payer: Self-pay | Admitting: Family Medicine

## 2015-02-05 ENCOUNTER — Other Ambulatory Visit: Payer: Self-pay | Admitting: Nurse Practitioner

## 2015-02-05 MED ORDER — AMOXICILLIN-POT CLAVULANATE 875-125 MG PO TABS: 1.0000 | ORAL_TABLET | Freq: Two times a day (BID) | ORAL | Status: DC

## 2015-02-05 NOTE — Telephone Encounter (Signed)
Patient was given a Depo Medrol 40 mg injection on 01/29/15. No antibiotic was prescribed.

## 2015-02-05 NOTE — Telephone Encounter (Signed)
Can she take Augmentin? I know she is not allergic just want to make sure no intolerance.

## 2015-02-05 NOTE — Telephone Encounter (Signed)
Call on [phone]

## 2015-02-05 NOTE — Telephone Encounter (Signed)
Patient seen for vasomotor rhinitis on 01/29/2015.  She is still having sinus pressure, cough, colored phlegm, dont think she is running a fever, no trouble breathing other than coughing.  She wants to know if she should come back in or if we can try her on another medication?    [facility]

## 2015-02-05 NOTE — Telephone Encounter (Signed)
Pt states she can take augmentin. [facility]. Would like a call back after med is sent in

## 2015-02-10 ENCOUNTER — Ambulatory Visit (INDEPENDENT_AMBULATORY_CARE_PROVIDER_SITE_OTHER): Payer: Medicare Other

## 2015-02-10 DIAGNOSIS — Z23 Encounter for immunization: Secondary | ICD-10-CM | POA: Diagnosis not present

## 2015-02-10 DIAGNOSIS — J309 Allergic rhinitis, unspecified: Secondary | ICD-10-CM

## 2015-02-15 DIAGNOSIS — Z1231 Encounter for screening mammogram for malignant neoplasm of breast: Secondary | ICD-10-CM | POA: Diagnosis not present

## 2015-02-24 ENCOUNTER — Ambulatory Visit (INDEPENDENT_AMBULATORY_CARE_PROVIDER_SITE_OTHER): Payer: Medicare Other

## 2015-02-24 DIAGNOSIS — J309 Allergic rhinitis, unspecified: Secondary | ICD-10-CM | POA: Diagnosis not present

## 2015-03-10 ENCOUNTER — Ambulatory Visit (INDEPENDENT_AMBULATORY_CARE_PROVIDER_SITE_OTHER): Payer: Medicare Other

## 2015-03-10 DIAGNOSIS — J309 Allergic rhinitis, unspecified: Secondary | ICD-10-CM | POA: Diagnosis not present

## 2015-03-22 DIAGNOSIS — H2513 Age-related nuclear cataract, bilateral: Secondary | ICD-10-CM | POA: Diagnosis not present

## 2015-03-24 ENCOUNTER — Ambulatory Visit (INDEPENDENT_AMBULATORY_CARE_PROVIDER_SITE_OTHER): Payer: Medicare Other

## 2015-03-24 DIAGNOSIS — J309 Allergic rhinitis, unspecified: Secondary | ICD-10-CM | POA: Diagnosis not present

## 2015-04-01 ENCOUNTER — Encounter: Payer: Self-pay | Admitting: Family Medicine

## 2015-04-01 ENCOUNTER — Ambulatory Visit (INDEPENDENT_AMBULATORY_CARE_PROVIDER_SITE_OTHER): Payer: Medicare Other | Admitting: Family Medicine

## 2015-04-01 VITALS — BP 104/68 | Temp 97.8°F | Ht 64.0 in | Wt 102.0 lb

## 2015-04-01 DIAGNOSIS — J329 Chronic sinusitis, unspecified: Secondary | ICD-10-CM

## 2015-04-01 MED ORDER — LEVOFLOXACIN 500 MG PO TABS: 500.0000 mg | ORAL_TABLET | Freq: Every day | ORAL | Status: AC

## 2015-04-01 NOTE — Progress Notes (Signed)
   Subjective:    Patient ID: Mary RoyaltySandra M Dattilio, female    DOB: 04-09-1945, 69 y.o.   MRN: [id number]  Sinusitis This is a new problem. The current episode started 1 to 4 weeks ago. There has been no fever. The pain is moderate. Associated symptoms include congestion and coughing. (Abdominal pain ) Treatments tried: aleve, cough syrup. The treatment provided no relief.  cont drainage and sig reflux  Cough ane cong positive productive of phlegm. Complicated by history of bronchiectasis  Multiple vom several times 2 nights ago. Now resolved.  prilosic for months   Reflux is worsened lately on one daily Prilosec    Review of Systems  HENT: Positive for congestion.   Respiratory: Positive for cough.        Objective:   Physical Exam  Alert, mild malaise. Hydration good Vitals stable. frontal/ maxillary tenderness evident positive nasal congestion. pharynx normal neck supple  lungs clear positive basilar crackles/baseline no wheezes. heart regular in rhythm       Assessment & Plan:  Impression rhinosinusitis likely post viral, discussed with patient also complicated by bronchiectasis and flare of reflux.. plan double Prilosec for now. antibiotics prescribed. Questions answered. Symptomatic care discussed. warning signs discussed. WSL

## 2015-04-07 ENCOUNTER — Ambulatory Visit: Payer: Medicare Other

## 2015-04-14 ENCOUNTER — Ambulatory Visit (INDEPENDENT_AMBULATORY_CARE_PROVIDER_SITE_OTHER): Payer: Medicare Other | Admitting: Family Medicine

## 2015-04-14 ENCOUNTER — Ambulatory Visit (INDEPENDENT_AMBULATORY_CARE_PROVIDER_SITE_OTHER): Payer: Medicare Other

## 2015-04-14 ENCOUNTER — Encounter: Payer: Self-pay | Admitting: Family Medicine

## 2015-04-14 VITALS — BP 120/78 | Temp 98.6°F | Ht 64.0 in | Wt 103.8 lb

## 2015-04-14 DIAGNOSIS — J309 Allergic rhinitis, unspecified: Secondary | ICD-10-CM | POA: Diagnosis not present

## 2015-04-14 DIAGNOSIS — J470 Bronchiectasis with acute lower respiratory infection: Secondary | ICD-10-CM | POA: Diagnosis not present

## 2015-04-14 DIAGNOSIS — R0600 Dyspnea, unspecified: Secondary | ICD-10-CM | POA: Diagnosis not present

## 2015-04-14 DIAGNOSIS — R062 Wheezing: Secondary | ICD-10-CM

## 2015-04-14 MED ORDER — PREDNISONE 10 MG PO TABS: ORAL_TABLET | ORAL | Status: DC

## 2015-04-14 MED ORDER — ALBUTEROL SULFATE HFA 108 (90 BASE) MCG/ACT IN AERS: 2.0000 | INHALATION_SPRAY | Freq: Four times a day (QID) | RESPIRATORY_TRACT | Status: DC | PRN

## 2015-04-14 MED ORDER — CLARITHROMYCIN 500 MG PO TABS: 500.0000 mg | ORAL_TABLET | Freq: Two times a day (BID) | ORAL | Status: DC

## 2015-04-14 NOTE — Progress Notes (Signed)
   Subjective:    Patient ID: Mary [name], female    DOB: 04-08-1945, 70 y.o.   MRN: [id number]  Cough This is a new problem. The current episode started in the past 7 days. Associated symptoms include ear pain, headaches, nasal congestion, a sore throat and wheezing. Treatments tried: cough med.    Took delsym and mucinex cough med  Pressure in ears and major cog and stufines  Wheezing sig in thre chest  Took symbicor t for chest wheeziness  Pt took meds prophylacticlly inpast for MRSA took azith and doxy  Not using albuterol at this time hardly at all    Review of Systems  HENT: Positive for ear pain and sore throat.   Respiratory: Positive for cough and wheezing.   Neurological: Positive for headaches.      no fever or chills no vomiting Objective:   Physical Exam  Alert mild malaise. H&T moderate his congestion trace normal lungs bilateral wheezes no tachypnea inspiratory crackles O2 sat 99%      Assessment & Plan:  Impression flare of reactive airways with infection and bronchiectasis clinically worse though not unstable plan prednisone taper and albuterol. Antibiotics prescribed WSL

## 2015-04-28 ENCOUNTER — Telehealth: Payer: Self-pay | Admitting: Internal Medicine

## 2015-04-28 ENCOUNTER — Ambulatory Visit (INDEPENDENT_AMBULATORY_CARE_PROVIDER_SITE_OTHER): Payer: Medicare Other

## 2015-04-28 DIAGNOSIS — J309 Allergic rhinitis, unspecified: Secondary | ICD-10-CM

## 2015-04-28 NOTE — Telephone Encounter (Signed)
Patient Returned call [phone]

## 2015-04-28 NOTE — Telephone Encounter (Signed)
LMTCB x 1 

## 2015-04-28 NOTE — Telephone Encounter (Signed)
lmtcb for pt.  

## 2015-04-28 NOTE — Telephone Encounter (Signed)
orignal message closed in error.  Patient Returned call   pt states she will be stopping her shots once she finishes her bottle said she doesnt need a call back

## 2015-04-30 NOTE — Telephone Encounter (Signed)
I have forwarded message to CY and Dimas Millin so they are both aware that patient will stop her allergy injections once her current supply runs out.

## 2015-04-30 NOTE — Telephone Encounter (Signed)
Pt states she is going to finish the bottle of serum that she is using she is not coming back to get her allergy shots she doesn't want a call back

## 2015-04-30 NOTE — Telephone Encounter (Signed)
Is she referring to her Allergy Vaccines?  LM x 1

## 2015-04-30 NOTE — Telephone Encounter (Signed)
Fine to stop allergy vaccine as planned. I wil forward to Dimas Millin for her record.

## 2015-05-03 NOTE — Telephone Encounter (Signed)
Noted. I ask her to leave a message for CDY so he would know it. Nothing further needed.

## 2015-05-05 ENCOUNTER — Ambulatory Visit (INDEPENDENT_AMBULATORY_CARE_PROVIDER_SITE_OTHER): Payer: Medicare Other | Admitting: Emergency Medicine

## 2015-05-05 ENCOUNTER — Encounter: Payer: Self-pay | Admitting: Emergency Medicine

## 2015-05-05 VITALS — BP 98/54 | HR 80 | Ht 65.0 in | Wt 102.6 lb

## 2015-05-05 DIAGNOSIS — J479 Bronchiectasis, uncomplicated: Secondary | ICD-10-CM | POA: Diagnosis not present

## 2015-05-05 DIAGNOSIS — R059 Cough, unspecified: Secondary | ICD-10-CM

## 2015-05-05 DIAGNOSIS — R05 Cough: Secondary | ICD-10-CM

## 2015-05-05 NOTE — Progress Notes (Signed)
Patient ID: Mary [name], female    DOB: 1946-01-25, 70 y.o.   MRN: [id number] HPI Mary [name] is a 70 year old woman whom I have followed for chronic cough. 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 Evaluated 2011 by Dr Mary [name] for allergies - shows multiple sensitivities that are present year round.   ROV 07/12/09 -- returns for [name]. Tells me that her PND has actually improved since last visit. Her regimen is benadryl  two times a day, NSW two times a day, flutter valve two times a day - sometimes productive but often not. Also on rotating doxy + azithromycin. No flares of bronchiectasis since last visit. She is contemplating starting allergy shots, hopefully can get them from her PCP.   ROV Mary/1/11 -- [name] bronchiectasis, cough. Flare that started about a week ago. Began treatment with Avelox and set up this follow up. Was exposed to husband who had a URI. Changed her benadryl to Claritin D. She has not started allergy shots yet. Starting to feel better, not back to baseline. never started Singulair.   ROV 10/Mary/11 -- Hx chronic cough, bronchiectasis. Tried daliresp, couldn't tolerate due to insomnia. Still with clear drainage, never responded to treatment with nasal steroid. On benadryl qhs. Taking alternating doxy and azithro monthly. Considering starting allergy shots - discussing w Dr Mary [name].   ROV 08/05/10 -- bronchiectasis, chronic cough, severe allergic rhinitis. Doing NSW qd and flutter qd.  She is on allergy shots and nasal gtt may be better. She is alternating doxy with azithro at the beginning of every month. Her cough is predicatble - morning and afternoon. Worst if she has been off abx for an extended period. Last CT scan was 2008.   Mary/18/12-  Mary [name]  Never smoker followed for bronchiectasis/ hx M.avium, chronic cough, severe allergic rhinitis Started allergy vaccine in January, now at 1:50 with occasional minimal  local soreness but no problems. Says the spring pollen season was "rough"- increased nasal stuffiness and cough. Has been better in last 2 months. Still takes benadryl once each night. Hx of trying many nasal sprays. Still some nasal congestion and drip at times. She is interested in getting her shots in [place] at her PCP. allergy profile- IgE 2.4, negative common allergens. Negative Fungal antibody panel  01/20/11-  Mary [name]  Never smoker followed for bronchiectasis/ hx M.avium, chronic cough (Dr Mary [name]), severe allergic rhinitis She returns for followup of her allergy vaccine therapy begun in January 2012. She says she had 2 good weeks at the beginning of September but now feels nasal congestion in the morning with watery rhinorrhea later in the day "like a cold". She has not been taking antihistamines very often, but prefers Benadryl. Cough is productive of clear to yellow sputum with no blood or fever. Dr Mary [name] is alternating antibiotics. Flu shot is pending with her primary physician  ROV 05/05/11 -- bronchiectasis, chronic cough, severe allergic rhinitis. Hx MAIC and also MRSA. She has started allergy shots w Dr Mary [name]. She still has congestion and drainage, assoc w dry cough. She is alternating abx (doxy/azithro) beginning every month. The cough and sputum is bad between abx and she often has to start early. Doxy is better than azithro, but it is taking longer for it to be effective. She is wondering if she might be having some reflux.   ROV 06/21/11 -- bronchiectasis w hx MAIC + MRSA, chronic cough both due to bronchiectasis and to  UA irritation, severe allergic rhinitis. Returns after sputum cx >> shows MAIC, sensitivities not yet available. She continues to have cough that is most productive in the am and at the end of the day.   ROV 08/04/11 -- bronchiectasis w hx MAIC + MRSA, chronic cough both due to bronchiectasis and to UA irritation, severe allergic rhinitis. We started clarithro + [facility]ambutol  [facility], started it on 4/15. She has the bitter taste in her mouth, had this last time. I had thought this was due to rifabutin, but possibly due to clarithro. Not currently on doxy. Since last time she thinks she had URI, now probably back to baseline.   ROV 11/13/11 -- bronchiectasis w hx MAIC + MRSA, chronic cough both due to bronchiectasis and to UA irritation, severe allergic rhinitis. Has been on clarithro/ethambutol since 07/17/11. She has less energy on the abx. She continues to have cough that is productive of either green or clear. The cough is most bothersome at night, can wake her from sleep. She coughs every morning - clears secretions. Then around mid-day she has another bout, then again in the evening.    ROV 01/22/12 -- bronchiectasis w hx MAIC + MRSA, chronic cough both due to bronchiectasis and to UA irritation, severe allergic rhinitis. [facility] April '[facility]. She finished the abx 01/12/12 (6 months). She continues to have cough and UA irritation.  Her nasal gtt is better but not gone; has never benefited from nasal steroid, astelin. Needs repeat CT scan 08/2012.   07/03/12- 70 yo [name]  Never smoker followed for bronchiectasis/ hx M.avium, chronic cough (Dr Mary [name]), severe allergic rhinitis FOLLOWS FOR: sneezing and runny nose; cough and drainage as well. Still on allergy vaccine Missed her allergy shots 1:10 GH for month of March after fall with cracked pelvis.  Incvreased sneeze, watery rhinorhea, much post-nasal drip. No headache, ears, purulent. Blames PND for increased cough from her bronchiectasis.  Taking a Sudafed-PE product containing an antihistamine, nasal saline irrigation.  ROV 07/24/12 -- bronchiectasis w hx MAIC + MRSA, chronic cough both due to bronchiectasis and to UA irritation, severe allergic rhinitis. She is on immunotherapy per Dr Mary [name], doing NSW's. Had never benefited from nasal steroid or astelin in the past. Started dymista  on 4/2 from dr Mary [name]. Also had Ct scan sinuses > all clear 07/16/12.  She unfortunately fell and broke her pelvis in early March. Her cough continues, still yellow/green/brown. Occasionally some blood from nose or sputum. Breathing stable. Not currently on rotating abx.   ROV 09/25/12 -- bronchiectasis w hx MAIC + MRSA, chronic cough both due to bronchiectasis and to UA irritation, severe allergic rhinitis. We restarted rotating abx after last visit. She notes that sputum is no longer as colored, still thick. Alternating doxy and clarithro.  She has also been on Keflex long term for recurrent UTI's. She is back on allergy shots.   ROV 1/Mary/15 -- bronchiectasis w hx MAIC + MRSA, chronic cough both due to bronchiectasis and to UA irritation, severe allergic rhinitis. She has followed with Dr Mary [name] and receiving allergy shots. She has persistent sx, but recently also had a URI which made everything even worse. She has noticed that she got a skin rash associated with her doxycycline. She is having some problems swallowing - things getting hung up in he throat, difficulty taking clarithro.   ROV 05/21/13 -- bronchiectasis w hx MAIC + MRSA, chronic cough both due to bronchiectasis and to  UA irritation, severe allergic rhinitis. Last time treated with levaquin, stopped rotating abx. She has continued persistent cough, every day all day. Often not productive. She has seen some blood in sputum for last 2 weeks. Her barium study showed a zenker's diverticulum, small HH, some dilation. She has at times coughed enough to bring up small food particles, but she distinguishes this from true emesis. She tried albuterol some - mixed results.   ROV 09/10/13 -- Bronchiectasis w hx MAIC + MRSA, chronic cough both due to bronchiectasis and to UA irritation, severe allergic rhinitis. She is on max therapy for allergies including immunotherapy.  She recently underwent zenker's diverticulotomy on 08/01/13 with Dr Jenne Pane. She has improved  remarkably. She does still cough some, less productive and much less frequent. She is off nasacort, omeprazole, allergra right now.   ROV 12/03/13 -- Bronchiectasis w hx MAIC + MRSA, chronic cough both due to bronchiectasis and to UA irritation, severe allergic rhinitis.  She has been coughing more and seeing darker mucous, often with blood. She is not on abx right now.     ROV 04/01/14 -- [name] visit for bronchiectasis. She was treated with levaquin last time for a flare. She is back to her baseline - coughs for 30 minutes in the am, then around 1pm she starts up again for the rest of the day. She is still seeing blood. She describes some swallowing problems > takes 2 swallows to get her food down, sometimes lodges. She has a hx Zenker's diverticulum and repair.   ROV 09/09/14 -- follow-up visit for bronchiectasis and chronic cough that is likely multifactorial. 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 On 07/22/14 she underwent a barium swallow which I have reviewed today. It showed no change in her lower cervical lateral esophageal diverticulum. Some residual barium remained in that diverticulum. She has a small hiatal hernia and some associated GERD. She has seen Dr Jenne Pane, tried increased dose prilosec and she saw some improvement. She is currently off. She coughs in the am, clears mucous. Then she has more cough around lunch. She is on anti- histamine and a nasal steroid  ROV 12/02/14 -- follow up visit for chronic cough, bronchiectasis, GERD and hiatal hernia.   Last visit we started a trial of Symbicort, to see if she would benefit. Her cough is better, but unclear whether this is because she was improving already. Not sure we can ascribe to the symbicort. She is on Actuary. No change in exertional tolerance. Has intermittent nasal congestion.   ROV 05/05/15 -- history of bronchiectasis, chronic cough, hiatal hernia and GERD. She also has Zenker's  diverticulum. She was recently treated after Xmas and then again in early January, more sinus drainage, dyspnea, wheeze.  Much more purulent sputum. Was treated with azithro and then clarithro. Returned to baseline. She is not going to take allergy shots anymore, she uses nasal steroid prn, on allegra qd.  Restarted omeprazole while she was flaring, but not currently using.     Filed Vitals:   05/05/15 1334  BP: 98/54  Pulse: 80  Height: 5\' 5"  (1.651 m)  Weight: 102 lb 9.6 oz (46.539 kg)  SpO2: 94%   Gen: Pleasant, well-nourished, in no distress,  normal affect  ENT: No lesions,  mouth clear,  oropharynx clear, no postnasal drip  Neck: No JVD, no TMG, no carotid bruits  Lungs: No use of accessory muscles, no dullness to percussion, clear without  rales or rhonchi  Cardiovascular: RRR, heart sounds normal, no murmur or gallops, no peripheral edema  Musculoskeletal: No deformities, no cyanosis or clubbing  Neuro: alert, non focal  Skin: Warm, no lesions or rashes    COUGH, CHRONIC Please continue your allegra daily Start using your nasal steroid spray every day.  We will try stopping your allergy shots - reassess at some point in the future depending on your symptoms.  Consider taking omeprazole  every day. We can determine in the future whether to continue this routine.  Follow with Dr Mary [name] in 3 months or sooner if you have any problems.  BRONCHIECTASIS Secretion maintenance and amount of time spent bringing up mucus every day is limiting. I need to consider flutter valve or chest vest at some point in the future

## 2015-05-05 NOTE — Assessment & Plan Note (Signed)
Please continue your allegra daily Start using your nasal steroid spray every day.  We will try stopping your allergy shots - reassess at some point in the future depending on your symptoms.  Consider taking omeprazole 20mg every day. We can determine in the future whether to continue this routine.  Follow with Dr Calista Crain in 3 months or sooner if you have any problems.  

## 2015-05-05 NOTE — Assessment & Plan Note (Signed)
Secretion maintenance and amount of time spent bringing up mucus every day is limiting. I need to consider flutter valve or chest vest at some point in the future

## 2015-05-05 NOTE — Patient Instructions (Signed)
Please continue your allegra daily Start using your nasal steroid spray every day.  We will try stopping your allergy shots - reassess at some point in the future depending on your symptoms.  Consider taking omeprazole  every day. We can determine in the future whether to continue this routine.  Follow with Dr Delton Coombes in 3 months or sooner if you have any problems.

## 2015-05-12 ENCOUNTER — Ambulatory Visit (INDEPENDENT_AMBULATORY_CARE_PROVIDER_SITE_OTHER): Payer: Medicare Other

## 2015-05-12 DIAGNOSIS — J309 Allergic rhinitis, unspecified: Secondary | ICD-10-CM

## 2015-05-19 ENCOUNTER — Other Ambulatory Visit: Payer: Self-pay | Admitting: Family Medicine

## 2015-05-26 ENCOUNTER — Ambulatory Visit (INDEPENDENT_AMBULATORY_CARE_PROVIDER_SITE_OTHER): Payer: Medicare Other

## 2015-05-26 DIAGNOSIS — J309 Allergic rhinitis, unspecified: Secondary | ICD-10-CM

## 2015-06-09 ENCOUNTER — Ambulatory Visit (INDEPENDENT_AMBULATORY_CARE_PROVIDER_SITE_OTHER): Payer: Medicare Other | Admitting: *Deleted

## 2015-06-09 DIAGNOSIS — J309 Allergic rhinitis, unspecified: Secondary | ICD-10-CM | POA: Diagnosis not present

## 2015-06-14 ENCOUNTER — Other Ambulatory Visit: Payer: Self-pay | Admitting: Family Medicine

## 2015-06-16 ENCOUNTER — Ambulatory Visit (INDEPENDENT_AMBULATORY_CARE_PROVIDER_SITE_OTHER): Payer: Medicare Other | Admitting: Family Medicine

## 2015-06-16 ENCOUNTER — Encounter: Payer: Self-pay | Admitting: Family Medicine

## 2015-06-16 VITALS — BP 110/68 | Temp 97.9°F | Ht 64.0 in | Wt 103.0 lb

## 2015-06-16 DIAGNOSIS — N3000 Acute cystitis without hematuria: Secondary | ICD-10-CM | POA: Diagnosis not present

## 2015-06-16 DIAGNOSIS — R300 Dysuria: Secondary | ICD-10-CM

## 2015-06-16 LAB — POCT URINALYSIS DIPSTICK: PH UA: 7

## 2015-06-16 MED ORDER — CIPROFLOXACIN HCL 250 MG PO TABS: 250.0000 mg | ORAL_TABLET | Freq: Two times a day (BID) | ORAL | Status: DC

## 2015-06-16 NOTE — Progress Notes (Signed)
   Subjective:    Patient ID: Mary [name], female    DOB: 14-Feb-1946, 70 y.o.   MRN: [id number]  Dysuria  This is a new problem. The current episode started yesterday. Associated symptoms include hematuria and urgency. Treatments tried: fluids, azo.    yest mor came on suddenly  freq urin  Pos dysuria  And urgency  And burning  Took azo and fluids, pt worked yesterday  Azo helped some  No feer noc hills   Review of Systems  Genitourinary: Positive for dysuria, urgency and hematuria.   Chronic cough noted no fever no chills no vomiting    Objective:   Physical Exam  Alert vitals stable no acute distress lungs clear heart and rhythm no CVA tenderness positive baseline days or crackles      Assessment & Plan:  Impression 1 urinary tract infection #2 bronchiectasis stable per patient plan antibiotics prescribed. Symptom care discussed culture urine WSL

## 2015-06-18 LAB — URINE CULTURE

## 2015-06-21 ENCOUNTER — Telehealth: Payer: Self-pay | Admitting: Family Medicine

## 2015-06-21 ENCOUNTER — Other Ambulatory Visit: Payer: Self-pay | Admitting: *Deleted

## 2015-06-21 MED ORDER — AMOXICILLIN-POT CLAVULANATE 875-125 MG PO TABS: 1.0000 | ORAL_TABLET | Freq: Two times a day (BID) | ORAL | Status: DC

## 2015-06-21 NOTE — Telephone Encounter (Signed)
Patient was placed on cipro 06/16/15 and not improving UTI sx and would like a different antibiotic sent in

## 2015-06-21 NOTE — Telephone Encounter (Signed)
See my urine lab note i wrote last night

## 2015-06-21 NOTE — Telephone Encounter (Signed)
Pt seen for UTI UTI issued antibiotic states she is still having burning and  Frequency does not see difference with this med wants to try another please    [facility]

## 2015-06-21 NOTE — Telephone Encounter (Signed)
Discussed with pt. New med sent to pharm. See lab results

## 2015-06-23 ENCOUNTER — Ambulatory Visit (INDEPENDENT_AMBULATORY_CARE_PROVIDER_SITE_OTHER): Payer: Medicare Other

## 2015-06-23 DIAGNOSIS — J309 Allergic rhinitis, unspecified: Secondary | ICD-10-CM | POA: Diagnosis not present

## 2015-07-06 ENCOUNTER — Ambulatory Visit (INDEPENDENT_AMBULATORY_CARE_PROVIDER_SITE_OTHER): Payer: Medicare Other | Admitting: Family Medicine

## 2015-07-06 VITALS — BP 134/68 | Temp 99.0°F

## 2015-07-06 DIAGNOSIS — N3000 Acute cystitis without hematuria: Secondary | ICD-10-CM | POA: Diagnosis not present

## 2015-07-06 DIAGNOSIS — R300 Dysuria: Secondary | ICD-10-CM

## 2015-07-06 LAB — POCT URINALYSIS DIPSTICK
SPEC GRAV UA: 1.01
pH, UA: 7

## 2015-07-06 MED ORDER — NITROFURANTOIN MONOHYD MACRO 100 MG PO CAPS: 100.0000 mg | ORAL_CAPSULE | Freq: Two times a day (BID) | ORAL | Status: AC

## 2015-07-06 NOTE — Progress Notes (Signed)
   Subjective:    Patient ID: Mary RoyaltySandra M Topor, female    DOB: 1945/10/22, 70 y.o.   MRN: [id number]  Dysuria  This is a new problem. The current episode started today. Associated symptoms include urgency.   Still burning at times, but no urgency Azo   Urinary tract back again pt considering referral if cont to bother   Seen just a couple weeks ago for UTI. Was resistant to initial Cipro so switch to Augmentin. Took a bulb Augmentin now is had a resurgence of symptoms positive dysuria no fever no chills no vomiting  Review of Systems  Genitourinary: Positive for dysuria and urgency.       Objective:   Physical Exam  Alert vital stable HEENT normal lungs clear other than basilar crackles heart regular rate and rhythm.   Urinalysis 2-4 white blood's high-powered field      Assessment & Plan:  Impression recurrent UTI plan Macrobid 100 twice a day 7 days. Symptom care discussed culture done patient states if recurs one more time once to see a different urologist

## 2015-07-07 ENCOUNTER — Ambulatory Visit (INDEPENDENT_AMBULATORY_CARE_PROVIDER_SITE_OTHER): Payer: Medicare Other | Admitting: *Deleted

## 2015-07-07 DIAGNOSIS — J309 Allergic rhinitis, unspecified: Secondary | ICD-10-CM | POA: Diagnosis not present

## 2015-07-08 LAB — URINE CULTURE

## 2015-08-04 ENCOUNTER — Encounter: Payer: Self-pay | Admitting: Emergency Medicine

## 2015-08-04 ENCOUNTER — Other Ambulatory Visit: Payer: Medicare Other

## 2015-08-04 ENCOUNTER — Ambulatory Visit (INDEPENDENT_AMBULATORY_CARE_PROVIDER_SITE_OTHER): Payer: Medicare Other | Admitting: Emergency Medicine

## 2015-08-04 VITALS — BP 126/72 | HR 83 | Ht 64.0 in | Wt 104.3 lb

## 2015-08-04 DIAGNOSIS — J479 Bronchiectasis, uncomplicated: Secondary | ICD-10-CM | POA: Diagnosis not present

## 2015-08-04 NOTE — Patient Instructions (Addendum)
We will continue your current medications as you are taking them  Use albuterol as needed We will perform a sputum culture and follow results to tailor future treatment.  We will perform a CT scan of the chest to compare with 2014, no contrast.  Follow with Dr Delton CoombesByrum in 4 months or sooner if you have any problems.

## 2015-08-04 NOTE — Assessment & Plan Note (Signed)
Continue chronic cough, significant bronchiectasis in setting of a history of Mycobacterium avium and also MRSA colonization. Stable but still symptomatically at this time. I discussed possibly starting scheduled azithromycin, scheduled ibuprofen with her today. I don't have any evidence that she needs suppressive nebulized therapy at this time. I like to repeat her CT scan of the chest and repeat her sputum cultures.

## 2015-08-04 NOTE — Addendum Note (Signed)
Addended by: Jaynee EaglesLEMONS, LINDSAY C on: 08/04/2015 02:41 PM   Modules accepted: Orders

## 2015-08-04 NOTE — Progress Notes (Signed)
Patient ID: Mary [name], female    DOB: Jan 06, 1946, 70 y.o.   MRN: [id number] HPI [name] is a 70 year old woman whom I have followed for chronic cough. 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 Evaluated 2011 by Dr Maple Hudson for allergies - shows multiple sensitivities that are present year round.   ROV 07/12/09 -- returns for f/u. Tells me that her PND has actually improved since last visit. Her regimen is benadryl  two times a day, NSW two times a day, flutter valve two times a day - sometimes productive but often not. Also on rotating doxy + azithromycin. No flares of bronchiectasis since last visit. She is contemplating starting allergy shots, hopefully can get them from her PCP.   ROV 10/01/09 -- f/u bronchiectasis, cough. Flare that started about a week ago. Began treatment with Avelox and set up this follow up. Was exposed to husband who had a URI. Changed her benadryl to Claritin D. She has not started allergy shots yet. Starting to feel better, not back to baseline. never started Singulair.   ROV 01/24/10 -- Hx chronic cough, bronchiectasis. Tried daliresp, couldn't tolerate due to insomnia. Still with clear drainage, never responded to treatment with nasal steroid. On benadryl qhs. Taking alternating doxy and azithro monthly. Considering starting allergy shots - discussing w Dr Maple Hudson.   ROV 08/05/10 -- bronchiectasis, chronic cough, severe allergic rhinitis. Doing NSW qd and flutter qd.  She is on allergy shots and nasal gtt may be better. She is alternating doxy with azithro at the beginning of every month. Her cough is predicatble - morning and afternoon. Worst if she has been off abx for an extended period. Last CT scan was 2008.   10/19/10-  59 yo F  Never smoker followed for bronchiectasis/ hx M.avium, chronic cough, severe allergic rhinitis Started allergy vaccine in January, now at 1:50 with occasional minimal  local soreness but no problems. Says the spring pollen season was "rough"- increased nasal stuffiness and cough. Has been better in last 2 months. Still takes benadryl once each night. Hx of trying many nasal sprays. Still some nasal congestion and drip at times. She is interested in getting her shots in [place] at her PCP. allergy profile- IgE 2.4, negative common allergens. Negative Fungal antibody panel  01/20/11-  41 yo F  Never smoker followed for bronchiectasis/ hx M.avium, chronic cough (Dr Delton Coombes), severe allergic rhinitis She returns for followup of her allergy vaccine therapy begun in January 2012. She says she had 2 good weeks at the beginning of September but now feels nasal congestion in the morning with watery rhinorrhea later in the day "like a cold". She has not been taking antihistamines very often, but prefers Benadryl. Cough is productive of clear to yellow sputum with no blood or fever. Dr Delton Coombes is alternating antibiotics. Flu shot is pending with her primary physician  ROV 05/05/11 -- bronchiectasis, chronic cough, severe allergic rhinitis. Hx MAIC and also MRSA. She has started allergy shots w Dr Maple Hudson. She still has congestion and drainage, assoc w dry cough. She is alternating abx (doxy/azithro) beginning every month. The cough and sputum is bad between abx and she often has to start early. Doxy is better than azithro, but it is taking longer for it to be effective. She is wondering if she might be having some reflux.   ROV 06/21/11 -- bronchiectasis w hx MAIC + MRSA, chronic MALLORI ARAQUEo bronchiectasis and to  UA irritation, severe allergic rhinitis. Returns after sputum cx >> shows MAIC, sensitivities not yet available. She continues to have cough that is most productive in the am and at the end of the day.   ROV 08/04/11 -- bronchiectasis w hx MAIC + MRSA, chronic cough both due to bronchiectasis and to UA irritation, severe allergic rhinitis. We [facility] clarithro [facility]  [facility], started it on 4/15. She has the bitter taste in her mouth, had this last time. I had thought this was due to rifabutin, but possibly due to clarithro. Not currently on doxy. Since last time she thinks she had URI, now probably back to baseline.   ROV 11/13/11 -- bronchiectasis w hx MAIC + MRSA, chronic cough both due to bronchiectasis and to UA irritation, severe allergic rhinitis. Has been on clarithro/ethambutol since 07/17/11. She has less energy on the abx. She continues to have cough that is productive of either green or clear. The cough is most bothersome at night, can wake her from sleep. She coughs every morning - clears secretions. Then around mid-day she has another bout, then again in the evening.    ROV 01/22/12 -- bronchiectasis w hx MAIC + MRSA, chronic cough both due to bronchiectasis and to UA irritation, severe allergic rhinitis. [facility] April '[facility]. She finished the abx 01/12/12 (6 months). She continues to have cough and UA irritation.  Her nasal gtt is better but not gone; has never benefited from nasal steroid, astelin. Needs repeat CT scan 08/2012.   07/03/12- 70 yo F  Never smoker followed for bronchiectasis/ hx M.avium, chronic cough (Dr Delton CoombesByrum), severe allergic rhinitis FOLLOWS FOR: sneezing and runny nose; cough and drainage as well. Still on allergy vaccine Missed her allergy shots 1:10 GH for month of March after fall with cracked pelvis.  Incvreased sneeze, watery rhinorhea, much post-nasal drip. No headache, ears, purulent. Blames PND for increased cough from her bronchiectasis.  Taking a Sudafed-PE product containing an antihistamine, nasal saline irrigation.  ROV 07/24/12 -- bronchiectasis w hx MAIC + MRSA, chronic cough both due to bronchiectasis and to UA irritation, severe allergic rhinitis. She is on immunotherapy per Dr Maple HudsonYoung, doing NSW's. Had never benefited from nasal steroid or astelin in the past. Started dymista  on 4/2 from dr Maple HudsonYoung. Also had Ct scan sinuses > all clear 07/16/12.  She unfortunately fell and broke her pelvis in early March. Her cough continues, still yellow/green/brown. Occasionally some blood from nose or sputum. Breathing stable. Not currently on rotating abx.   ROV 09/25/12 -- bronchiectasis w hx MAIC + MRSA, chronic cough both due to bronchiectasis and to UA irritation, severe allergic rhinitis. We restarted rotating abx after last visit. She notes that sputum is no longer as colored, still thick. Alternating doxy and clarithro.  She has also been on Keflex long term for recurrent UTI's. She is back on allergy shots.   ROV 04/09/13 -- bronchiectasis w hx MAIC + MRSA, chronic cough both due to bronchiectasis and to UA irritation, severe allergic rhinitis. She has followed with Dr Maple HudsonYoung and receiving allergy shots. She has persistent sx, but recently also had a URI which made everything even worse. She has noticed that she got a skin rash associated with her doxycycline. She is having some problems swallowing - things getting hung up in he throat, difficulty taking clarithro.   ROV 05/21/13 -- bronchiectasis w hx MAIC + MRSA, chronic cough both due to bronchiectasis and to  UA irritation, severe allergic rhinitis. Last time treated with levaquin, stopped rotating abx. She has continued persistent cough, every day all day. Often not productive. She has seen some blood in sputum for last 2 weeks. Her barium study showed a zenker's diverticulum, small HH, some dilation. She has at times coughed enough to bring up small food particles, but she distinguishes this from true emesis. She tried albuterol some - mixed results.   ROV 09/10/13 -- Bronchiectasis w hx MAIC + MRSA, chronic cough both due to bronchiectasis and to UA irritation, severe allergic rhinitis. She is on max therapy for allergies including immunotherapy.  She recently underwent zenker's diverticulotomy on 08/01/13 with Dr Jenne Pane. She has improved  remarkably. She does still cough some, less productive and much less frequent. She is off nasacort, omeprazole, allergra right now.   ROV 12/03/13 -- Bronchiectasis w hx MAIC + MRSA, chronic cough both due to bronchiectasis and to UA irritation, severe allergic rhinitis.  She has been coughing more and seeing darker mucous, often with blood. She is not on abx right now.     ROV 04/01/14 -- f/u visit for bronchiectasis. She was treated with levaquin last time for a flare. She is back to her baseline - coughs for 30 minutes in the am, then around 1pm she starts up again for the rest of the day. She is still seeing blood. She describes some swallowing problems > takes 2 swallows to get her food down, sometimes lodges. She has a hx Zenker's diverticulum and repair.   ROV 09/09/14 -- follow-up visit for bronchiectasis and chronic cough that is likely multifactorial. 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 On 07/22/14 she underwent a barium swallow which I have reviewed today. It showed no change in her lower cervical lateral esophageal diverticulum. Some residual barium remained in that diverticulum. She has a small hiatal hernia and some associated GERD. She has seen Dr Jenne Pane, tried increased dose prilosec and she saw some improvement. She is currently off. She coughs in the am, clears mucous. Then she has more cough around lunch. She is on anti- histamine and a nasal steroid  ROV 12/02/14 -- follow up visit for chronic cough, bronchiectasis, GERD and hiatal hernia.   Last visit we started a trial of Symbicort, to see if she would benefit. Her cough is better, but unclear whether this is because she was improving already. Not sure we can ascribe to the symbicort. She is on Actuary. No change in exertional tolerance. Has intermittent nasal congestion.   ROV 05/05/15 -- history of bronchiectasis, chronic cough, hiatal hernia and GERD. She also has Zenker's  diverticulum. She was recently treated after Xmas and then again in early January, more sinus drainage, dyspnea, wheeze.  Much more purulent sputum. Was treated with azithro and then clarithro. Returned to baseline. She is not going to take allergy shots anymore, she uses nasal steroid prn, on allegra qd.  Restarted omeprazole while she was flaring, but not currently using.   ROV 08/04/15 -- follow-up visit for history chronic cough, allergic rhinitis, GERD with admitted complications of a hiatal hernia and a Zenker's diverticulum. She also has associated bronchiectasis. Last time we stopped her allergy shots, she seems to have tolerated. She is still having every day cough, clear in the am, colored sputum in the pm. No longer on rotating abx, these had decreased efficacy and she developed an allergy. She is on allergy regimen.  No  GERD meds right now.     Filed Vitals:   08/04/15 1401  BP: 126/72  Pulse: 83  Height: 5\' 4"  (1.626 m)  Weight: 104 lb 4.8 oz (47.31 kg)  SpO2: 96%   Gen: Pleasant, well-nourished, in no distress,  normal affect  ENT: No lesions,  mouth clear,  oropharynx clear, no postnasal drip  Neck: No JVD, no TMG, no carotid bruits  Lungs: No use of accessory muscles, no dullness to percussion, clear without rales or rhonchi  Cardiovascular: RRR, heart sounds normal, no murmur or gallops, no peripheral edema  Musculoskeletal: No deformities, no cyanosis or clubbing  Neuro: alert, non focal  Skin: Warm, no lesions or rashes    BRONCHIECTASIS Continue chronic cough, significant bronchiectasis in setting of a history of Mycobacterium avium and also MRSA colonization. Stable but still symptomatically at this time. I discussed possibly starting scheduled azithromycin, scheduled ibuprofen with her today. I don't have any evidence that she needs suppressive nebulized therapy at this time. I like to repeat her CT scan of the chest and repeat her sputum cultures.

## 2015-08-16 ENCOUNTER — Ambulatory Visit (HOSPITAL_COMMUNITY)
Admission: RE | Admit: 2015-08-16 | Discharge: 2015-08-16 | Disposition: A | Discharge status: Home / Self Care | Payer: Medicare Other | Source: Ambulatory Visit | Attending: Emergency Medicine | Admitting: Emergency Medicine

## 2015-08-16 DIAGNOSIS — J479 Bronchiectasis, uncomplicated: Secondary | ICD-10-CM | POA: Diagnosis not present

## 2015-08-16 DIAGNOSIS — I700 Atherosclerosis of aorta: Secondary | ICD-10-CM | POA: Insufficient documentation

## 2015-08-16 DIAGNOSIS — I2510 Atherosclerotic heart disease of native coronary artery without angina pectoris: Secondary | ICD-10-CM | POA: Diagnosis not present

## 2015-08-16 DIAGNOSIS — R918 Other nonspecific abnormal finding of lung field: Secondary | ICD-10-CM | POA: Diagnosis not present

## 2015-08-19 ENCOUNTER — Encounter: Payer: Self-pay | Admitting: Internal Medicine

## 2015-09-01 ENCOUNTER — Encounter: Payer: Self-pay | Admitting: Family Medicine

## 2015-09-01 ENCOUNTER — Ambulatory Visit (INDEPENDENT_AMBULATORY_CARE_PROVIDER_SITE_OTHER): Payer: Medicare Other | Admitting: Family Medicine

## 2015-09-01 VITALS — BP 118/72 | Ht 64.0 in | Wt 103.0 lb

## 2015-09-01 DIAGNOSIS — M810 Age-related osteoporosis without current pathological fracture: Secondary | ICD-10-CM

## 2015-09-01 DIAGNOSIS — Z780 Asymptomatic menopausal state: Secondary | ICD-10-CM

## 2015-09-01 DIAGNOSIS — Z79899 Other long term (current) drug therapy: Secondary | ICD-10-CM

## 2015-09-01 DIAGNOSIS — Z13220 Encounter for screening for lipoid disorders: Secondary | ICD-10-CM | POA: Diagnosis not present

## 2015-09-01 DIAGNOSIS — Z0000 Encounter for general adult medical examination without abnormal findings: Secondary | ICD-10-CM

## 2015-09-01 MED ORDER — ALENDRONATE SODIUM 70 MG PO TABS: ORAL_TABLET | ORAL | Status: DC

## 2015-09-01 NOTE — Progress Notes (Signed)
   Subjective:    Patient ID: Mary RoyaltySandra M Eaker, female    DOB: 1945-10-16, 70 y.o.   MRN: [id number]  HPI AWV- Annual Wellness Visit  The patient was seen for their annual wellness visit. The patient's past medical history, surgical history, and family history were reviewed. Pertinent vaccines were reviewed ( tetanus, pneumonia, shingles, flu) The patient's medication list was reviewed and updated.  The height and weight were entered. The patient's current BMI is:17.68  Cognitive screening was completed. Outcome of Mini - Cog: pass  Falls within the past 6 months:no  Current tobacco usage: no (All patients who use tobacco were given written and verbal information on quitting)  Recent listing of emergency department/hospitalizations over the past year were reviewed.  current specialist the patient sees on a regular basis: yes-dr byrum-pulmonary doctor   Medicare annual wellness visit patient questionnaire was reviewed.  A written screening schedule for the patient for the next 5-10 years was given. Appropriate discussion of followup regarding next visit was discussed.  Patient reports she is moving slow lately-gets breast exam and pelvic thru gyn Bone density     Review of Systems  Constitutional: Negative for activity change, appetite change and fatigue.  HENT: Negative for congestion, ear discharge and rhinorrhea.   Eyes: Negative for discharge.  Respiratory: Negative for cough, chest tightness and wheezing.        Chronic lung challenges with hx bronchiectasis   Cardiovascular: Negative for chest pain.  Gastrointestinal: Negative for vomiting and abdominal pain.  Genitourinary: Negative for frequency and difficulty urinating.  Musculoskeletal: Negative for neck pain.  Allergic/Immunologic: Negative for environmental allergies and food allergies.  Neurological: Negative for weakness and headaches.  Psychiatric/Behavioral: Negative for behavioral problems and  agitation.  All other systems reviewed and are negative.      Objective:   Physical Exam  Constitutional: She is oriented to person, place, and time. She appears well-developed and well-nourished.  HENT:  Head: Normocephalic.  Right Ear: External ear normal.  Left Ear: External ear normal.  Eyes: Pupils are equal, round, and reactive to light.  Neck: Normal range of motion. No thyromegaly present.  Cardiovascular: Normal rate, regular rhythm, normal heart sounds and intact distal pulses.   No murmur heard. Pulmonary/Chest: Effort normal and breath sounds normal. No respiratory distress. She has no wheezes.  Abdominal: Soft. Bowel sounds are normal. She exhibits no distension and no mass. There is no tenderness.  Musculoskeletal: Normal range of motion. She exhibits no edema or tenderness.  Lymphadenopathy:    She has no cervical adenopathy.  Neurological: She is alert and oriented to person, place, and time. She exhibits normal muscle tone.  Skin: Skin is warm and dry.  Psychiatric: She has a normal mood and affect. Her behavior is normal.  Vitals reviewed.         Assessment & Plan:  Impression wellness exam #2 osteoporosis no bone density for 3 years planned diet discuss exercise discussed appropriate blood work. Patient up-to-date on mammogram. Patient needs colonoscopy sheet given advised to call Dr. Benard Rinkoark, medications refilled [facility]

## 2015-09-03 DIAGNOSIS — Z0000 Encounter for general adult medical examination without abnormal findings: Secondary | ICD-10-CM | POA: Diagnosis not present

## 2015-09-03 DIAGNOSIS — M810 Age-related osteoporosis without current pathological fracture: Secondary | ICD-10-CM | POA: Diagnosis not present

## 2015-09-03 DIAGNOSIS — Z13220 Encounter for screening for lipoid disorders: Secondary | ICD-10-CM | POA: Diagnosis not present

## 2015-09-03 DIAGNOSIS — Z79899 Other long term (current) drug therapy: Secondary | ICD-10-CM | POA: Diagnosis not present

## 2015-09-04 LAB — BASIC METABOLIC PANEL
BUN/Creatinine Ratio: 21 (ref 12–28)
BUN: 12 mg/dL (ref 8–27)
CALCIUM: 9.2 mg/dL (ref 8.7–10.3)
CO2: 25 mmol/L (ref 18–29)
CREATININE: 0.56 mg/dL — AB (ref 0.57–1.00)
Chloride: 99 mmol/L (ref 96–106)
GFR calc Af Amer: 110 mL/min/{1.73_m2} (ref >59)
GFR, EST NON AFRICAN AMERICAN: 95 mL/min/{1.73_m2} (ref >59)
Glucose: 82 mg/dL (ref 65–99)
POTASSIUM: 4.5 mmol/L (ref 3.5–5.2)
Sodium: 140 mmol/L (ref 134–144)

## 2015-09-04 LAB — LIPID PANEL
Chol/HDL Ratio: 2.5 ratio units (ref 0.0–4.4)
Cholesterol, Total: 160 mg/dL (ref 100–199)
HDL: 63 mg/dL (ref >39)
LDL CALC: 83 mg/dL (ref 0–99)
Triglycerides: 69 mg/dL (ref 0–149)
VLDL CHOLESTEROL CAL: 14 mg/dL (ref 5–40)

## 2015-09-04 LAB — HEPATIC FUNCTION PANEL
ALBUMIN: 4.2 g/dL (ref 3.6–4.8)
ALT: 18 IU/L (ref 0–32)
AST: 26 IU/L (ref 0–40)
Alkaline Phosphatase: 99 IU/L (ref 39–117)
Bilirubin Total: 0.4 mg/dL (ref 0.0–1.2)
Bilirubin, Direct: 0.14 mg/dL (ref 0.00–0.40)
TOTAL PROTEIN: 6.6 g/dL (ref 6.0–8.5)

## 2015-09-04 LAB — VITAMIN D 25 HYDROXY (VIT D DEFICIENCY, FRACTURES): Vit D, 25-Hydroxy: 45.3 ng/mL (ref 30.0–100.0)

## 2015-09-05 ENCOUNTER — Encounter: Payer: Self-pay | Admitting: Family Medicine

## 2015-09-06 ENCOUNTER — Ambulatory Visit (HOSPITAL_COMMUNITY)
Admission: RE | Admit: 2015-09-06 | Discharge: 2015-09-06 | Disposition: A | Discharge status: Home / Self Care | Payer: Medicare Other | Source: Ambulatory Visit | Attending: Family Medicine | Admitting: Family Medicine

## 2015-09-06 ENCOUNTER — Telehealth: Payer: Self-pay

## 2015-09-06 DIAGNOSIS — M810 Age-related osteoporosis without current pathological fracture: Secondary | ICD-10-CM | POA: Insufficient documentation

## 2015-09-06 DIAGNOSIS — Z780 Asymptomatic menopausal state: Secondary | ICD-10-CM | POA: Diagnosis not present

## 2015-09-07 NOTE — Telephone Encounter (Signed)
Gastroenterology Pre-Procedure Review  Request Date: 09/06/2015 Requesting Physician: Dr. Gerda DissLuking   PATIENT REVIEW QUESTIONS: The patient responded to the following health history questions as indicated:    Pt's last colonoscopy was 12/16/2004 by Dr. Jena Gaussourk  1. Diabetes Melitis: no 2. Joint replacements in the past 12 months: no 3. Major health problems in the past 3 months: Recent bronchietasis 4. Has an artificial valve or MVP: no 5. Has a defibrillator: no 6. Has been advised in past to take antibiotics in advance of a procedure like teeth cleaning: no 7. Family history of colon cancer: no  8. Alcohol Use: no 9. History of sleep apnea: no     MEDICATION S & ALLERGIES:    Patient reports the following regarding taking any blood thinners:   Plavix? no Aspirin? YES Coumadin? no  Patient confirms/reports the following medications:  Current Outpatient Prescriptions  Medication Sig Dispense Refill  . albuterol (PROVENTIL HFA;VENTOLIN HFA) 108 (90 Base) MCG/ACT inhaler Inhale 2 puffs into the lungs every 6 (six) hours as needed for wheezing or shortness of breath. 1 Inhaler 2  . alendronate (FOSAMAX) 70 MG tablet TAKE 1 TABLET BY MOUTH ONCE A WEEK AS DIRECTED 4 tablet 11  . aspirin EC 81 MG tablet Take 81 mg by mouth daily.    . Calcium-Vitamin D-Vitamin K (VIACTIV PO) Take 2 each by mouth daily.    . Multiple Vitamins-Minerals (ONE-A-DAY 50 PLUS PO) Take 2 tablets by mouth daily.     Marland Kitchen. triamcinolone (NASACORT AQ) 55 MCG/ACT AERO nasal inhaler Place 2 sprays into the nose daily. Reported on 09/06/2015    . fexofenadine (ALLEGRA) 180 MG tablet Take 180 mg by mouth daily. Reported on 09/06/2015     No current facility-administered medications for this visit.    Patient confirms/reports the following allergies:  Allergies  Allergen Reactions  . Sulfamethoxazole-Trimethoprim     REACTION: Rash  . Doxycycline Rash    Splotches on skin    No orders of the defined types were placed in  this encounter.    AUTHORIZATION INFORMATION Primary Insurance:ID #:  Group #:  Pre-Cert / Auth required: Pre-Cert / Auth #:   Secondary Insurance:   ID #:   Group #:  Pre-Cert / Auth required:  Pre-Cert / Auth #:   SCHEDULE INFORMATION: Procedure has been scheduled as follows:  Date:  09/16/2015               Time: 11:00 AM Location: [facility] Short Stay  This Gastroenterology Pre-Precedure Review Form is being routed to the following provider(s): R. Roetta SessionsMichael Rourk, MD

## 2015-09-08 ENCOUNTER — Other Ambulatory Visit: Payer: Self-pay

## 2015-09-08 DIAGNOSIS — Z1211 Encounter for screening for malignant neoplasm of colon: Secondary | ICD-10-CM

## 2015-09-08 MED ORDER — PEG-KCL-NACL-NASULF-NA ASC-C 100 G PO SOLR: 1.0000 | ORAL | Status: DC

## 2015-09-08 MED ORDER — PEG 3350-KCL-NA BICARB-NACL 420 G PO SOLR: 4000.0000 mL | ORAL | Status: DC

## 2015-09-08 NOTE — Addendum Note (Signed)
Addended by: Lavena BullionSTEWART, DORIS H on: 09/08/2015 03:57 PM   Modules accepted: Orders

## 2015-09-08 NOTE — Telephone Encounter (Signed)
Rx sent to the pharmacy and instructions mailed to pt.  

## 2015-09-08 NOTE — Telephone Encounter (Signed)
Appropriate.

## 2015-09-08 NOTE — Telephone Encounter (Signed)
Per pharmacy Movie prep not covered. Sending in Schulterrilyte and mailing new instructions.

## 2015-09-15 ENCOUNTER — Telehealth: Payer: Self-pay

## 2015-09-15 NOTE — Telephone Encounter (Signed)
Noted  

## 2015-09-15 NOTE — Telephone Encounter (Signed)
Pt called with concerns about her prep. Her insurance did not cover Movie prep and we had to send Trilyte. She did not receive the instructions for the Trilyte prep until after she had eaten supper last night ( on 13th and colonoscopy to be done on 15th). Instructions were mailed on 09/08/2015. She had eaten a fish sandwich for supper and was concerned if she needed to reschedule. I told her that would not be necessary if she will just not eat any more solid foods until after her procedure. She was concerned because her insurance did not pay for the Movie prep, said they were listed on the same tier. I told her she would have to take that up with her insurance co and pharmacy.   She also enquired as to why [facility] her about her medication list, and I explained to her that is routine if you are having a procedure at the hospital.  Routing to Gerrit HallsAnna Sams, NP for FYI who signed off on this triage.

## 2015-09-16 ENCOUNTER — Encounter (HOSPITAL_COMMUNITY)
Admission: RE | Disposition: A | Discharge status: Home / Self Care | Payer: Self-pay | Source: Ambulatory Visit | Attending: Internal Medicine

## 2015-09-16 ENCOUNTER — Ambulatory Visit (HOSPITAL_COMMUNITY)
Admission: RE | Admit: 2015-09-16 | Discharge: 2015-09-16 | Disposition: A | Discharge status: Home / Self Care | Payer: Medicare Other | Source: Ambulatory Visit | Attending: Internal Medicine | Admitting: Internal Medicine

## 2015-09-16 ENCOUNTER — Encounter (HOSPITAL_COMMUNITY): Payer: Self-pay | Admitting: *Deleted

## 2015-09-16 DIAGNOSIS — M810 Age-related osteoporosis without current pathological fracture: Secondary | ICD-10-CM | POA: Insufficient documentation

## 2015-09-16 DIAGNOSIS — Z7982 Long term (current) use of aspirin: Secondary | ICD-10-CM | POA: Diagnosis not present

## 2015-09-16 DIAGNOSIS — M19041 Primary osteoarthritis, right hand: Secondary | ICD-10-CM | POA: Insufficient documentation

## 2015-09-16 DIAGNOSIS — K219 Gastro-esophageal reflux disease without esophagitis: Secondary | ICD-10-CM | POA: Diagnosis not present

## 2015-09-16 DIAGNOSIS — Z79899 Other long term (current) drug therapy: Secondary | ICD-10-CM | POA: Diagnosis not present

## 2015-09-16 DIAGNOSIS — M19042 Primary osteoarthritis, left hand: Secondary | ICD-10-CM | POA: Diagnosis not present

## 2015-09-16 DIAGNOSIS — Z8614 Personal history of Methicillin resistant Staphylococcus aureus infection: Secondary | ICD-10-CM | POA: Insufficient documentation

## 2015-09-16 DIAGNOSIS — J449 Chronic obstructive pulmonary disease, unspecified: Secondary | ICD-10-CM | POA: Diagnosis not present

## 2015-09-16 DIAGNOSIS — Z1211 Encounter for screening for malignant neoplasm of colon: Secondary | ICD-10-CM

## 2015-09-16 HISTORY — PX: COLONOSCOPY: SHX5424

## 2015-09-16 SURGERY — COLONOSCOPY
Anesthesia: Moderate Sedation

## 2015-09-16 MED ORDER — SIMETHICONE 40 MG/0.6ML PO SUSP
ORAL | Status: DC | PRN
  Administered 2015-09-16: 12:00:00

## 2015-09-16 MED ORDER — MEPERIDINE HCL 100 MG/ML IJ SOLN
INTRAMUSCULAR | Status: AC
  Filled 2015-09-16: qty 2

## 2015-09-16 MED ORDER — ONDANSETRON HCL 4 MG/2ML IJ SOLN
INTRAMUSCULAR | Status: AC
  Filled 2015-09-16: qty 2

## 2015-09-16 MED ORDER — MEPERIDINE HCL 100 MG/ML IJ SOLN
INTRAMUSCULAR | Status: DC | PRN
  Administered 2015-09-16: 50 mg via INTRAVENOUS

## 2015-09-16 MED ORDER — SODIUM CHLORIDE 0.9 % IV SOLN
INTRAVENOUS | Status: DC
  Administered 2015-09-16: 11:00:00 via INTRAVENOUS

## 2015-09-16 MED ORDER — MIDAZOLAM HCL 5 MG/5ML IJ SOLN
INTRAMUSCULAR | Status: AC
  Filled 2015-09-16: qty 10

## 2015-09-16 MED ORDER — ONDANSETRON HCL 4 MG/2ML IJ SOLN
INTRAMUSCULAR | Status: DC | PRN
  Administered 2015-09-16: 4 mg via INTRAVENOUS

## 2015-09-16 MED ORDER — MIDAZOLAM HCL 5 MG/5ML IJ SOLN
INTRAMUSCULAR | Status: DC | PRN
  Administered 2015-09-16 (×2): 1 mg via INTRAVENOUS
  Administered 2015-09-16: 2 mg via INTRAVENOUS

## 2015-09-16 NOTE — Discharge Instructions (Signed)
°  Colonoscopy °Discharge Instructions ° °Read the instructions outlined below and refer to this sheet in the next few weeks. These discharge instructions provide you with general information on caring for yourself after you leave the hospital. Your doctor may also give you specific instructions. While your treatment has been planned according to the most current medical practices available, unavoidable complications occasionally occur. If you have any problems or questions after discharge, call Dr. Chukwuebuka Churchill at [phone]. °ACTIVITY °· You may resume your regular activity, but move at a slower pace for the next 24 hours.  °· Take frequent rest periods for the next 24 hours.  °· Walking will help get rid of the air and reduce the bloated feeling in your belly (abdomen).  °· No driving for 24 hours (because of the medicine (anesthesia) used during the test).   °· Do not sign any important legal documents or operate any machinery for 24 hours (because of the anesthesia used during the test).  °NUTRITION °· Drink plenty of fluids.  °· You may resume your normal diet as instructed by your doctor.  °· Begin with a light meal and progress to your normal diet. Heavy or fried foods are harder to digest and may make you feel sick to your stomach (nauseated).  °· Avoid alcoholic beverages for 24 hours or as instructed.  °MEDICATIONS °· You may resume your normal medications unless your doctor tells you otherwise.  °WHAT YOU CAN EXPECT TODAY °· Some feelings of bloating in the abdomen.  °· Passage of more gas than usual.  °· Spotting of blood in your stool or on the toilet paper.  °IF YOU HAD POLYPS REMOVED DURING THE COLONOSCOPY: °· No aspirin products for 7 days or as instructed.  °· No alcohol for 7 days or as instructed.  °· Eat a soft diet for the next 24 hours.  °FINDING OUT THE RESULTS OF YOUR TEST °Not all test results are available during your visit. If your test results are not back during the visit, make an appointment  with your caregiver to find out the results. Do not assume everything is normal if you have not heard from your caregiver or the medical facility. It is important for you to follow up on all of your test results.  °SEEK IMMEDIATE MEDICAL ATTENTION IF: °· You have more than a spotting of blood in your stool.  °· Your belly is swollen (abdominal distention).  °· You are nauseated or vomiting.  °· You have a temperature over 101.  °· You have abdominal pain or discomfort that is severe or gets worse throughout the day.  ° ° °Your colonoscopy was normal today ° °I do not recommend a future colonoscopy unless you develop new symptoms. ° ° ° ° °

## 2015-09-16 NOTE — Op Note (Signed)
[facility] Patient Name: Mary [name] Procedure Date: 09/16/2015 11:20 AM MRN: [id number] Date of Birth: Oct 01, 1945 Attending MD: Gennette Pac , MD CSN: [id number] Age: 70 Admit Type: Outpatient Procedure:                Colonoscopy Indications:              Screening for colorectal malignant neoplasm Providers:                Gennette Pac, MD, Criselda Peaches. Mathis Fare RN, RN,                            Burke Keels, Technician Referring MD:              Medicines:                Midazolam 4 mg IV, Meperidine 50 mg IV, Ondansetron                            4 mg IV Complications:            No immediate complications. Estimated Blood Loss:     Estimated blood loss: none. Procedure:                Pre-Anesthesia Assessment:                           - Prior to the procedure, a History and Physical                            was performed, and patient medications and                            allergies were reviewed. The patient's tolerance of                            previous anesthesia was also reviewed. The risks                            and benefits of the procedure and the sedation                            options and risks were discussed with the patient.                            All questions were answered, and informed consent                            was obtained. Prior Anticoagulants: The patient has                            taken no previous anticoagulant or antiplatelet                            agents. ASA Grade Assessment: II - A patient with  mild systemic disease. After reviewing the risks                            and benefits, the patient was deemed in                            satisfactory condition to undergo the procedure.                           After obtaining informed consent, the colonoscope                            was passed under direct vision. Throughout the   procedure, the patient's blood pressure, pulse, and                            oxygen saturations were monitored continuously. The                            [id number] ([id number]) was introduced through the                            anus and advanced to the the cecum, identified by                            appendiceal orifice and ileocecal valve. The                            colonoscopy was performed without difficulty. The                            patient tolerated the procedure well. The quality                            of the bowel preparation was adequate. The                            ileocecal valve, appendiceal orifice, and rectum                            were photographed. Scope In: 11:39:07 AM Scope Out: 11:58:34 AM Scope Withdrawal Time: 0 hours 9 minutes 59 seconds  Total Procedure Duration: 0 hours 19 minutes 27 seconds  Findings:      The perianal and digital rectal examinations were normal.      The colonic mucosa appeared normal. Estimated blood loss: none. Impression:               - The entire examined colon is normal.                           - No specimens collected. Moderate Sedation:      Moderate (conscious) sedation was administered by the endoscopy nurse       and supervised by the endoscopist. The following parameters were       monitored: oxygen saturation, heart rate, blood pressure, respiratory  rate, EKG, adequacy of pulmonary ventilation, and response to care.       Total physician intraservice time was 26 minutes. Recommendation:           - Patient has a contact number available for                            emergencies. The signs and symptoms of potential                            delayed complications were discussed with the                            patient. Return to normal activities tomorrow.                            Written discharge instructions were provided to the                            patient.                            - Advance diet as tolerated.                           - Continue present medications.                           - No repeat colonoscopy due to age.                           - Return to GI clinic PRN. Procedure Code(s):        --- Professional ---                           [phone], Colonoscopy, flexible; diagnostic, including                            collection of specimen(s) by brushing or washing,                            when performed (separate procedure)                           99152, Moderate sedation services provided by the                            same physician or other qualified health care                            professional performing the diagnostic or                            therapeutic service that the sedation supports,                            requiring the  presence of an independent trained                            observer to assist in the monitoring of the                            patient's level of consciousness and physiological                            status; initial 15 minutes of intraservice time,                            patient age 62 years or older                           [phone], Moderate sedation services; each additional                            15 minutes intraservice time Diagnosis Code(s):        --- Professional ---                           Z12.11, Encounter for screening for malignant                            neoplasm of colon CPT copyright 2016 [employer]. All rights reserved. The codes documented in this report are preliminary and upon coder review may  be revised to meet current compliance requirements. Gerrit Friends. Elois Averitt, MD Gennette Pac, MD 09/16/2015 12:07:51 PM This report has been signed electronically. Number of Addenda: 0

## 2015-09-16 NOTE — H&P (Signed)
$'@LOGO'B$ @   Primary Care Physician:  Mickie Hillier, MD Primary Gastroenterologist:  Dr. Gala Romney  Pre-Procedure History & Physical: HPI:  Mary [name] is a 70 y.o. female is here for a screening colonoscopy. Reported negative colonoscopy 10 years ago-those records are currently unavailable. Patient without any GI symptoms at this time. No family history of colon cancer.  Past Medical History  Diagnosis Date  . Osteoporosis   . Allergic rhinitis     using Nasocort daily and Allegra daily  . Mycobacterium avium complex ([place])   . Bronchiectasis     uses Albuterol daily as needed but has been a while since used(more than a month ago),  . Chronic cough   . Diverticulosis   . Zenker's diverticulum   . PONV (postoperative nausea and vomiting)   . Joint pain   . GERD (gastroesophageal reflux disease)     takes Prilosec daily  . History of bladder infections     takes Keflex daily;Dr.Ottin is Dealer  . History of MRSA infection     several yrs ago  . Osteoporosis     takes Fosamax weekly  . COPD (chronic obstructive pulmonary disease) ([place])   . Pneumonia 1990's  . Arthritis     "hands" (08/01/2013)    Past Surgical History  Procedure Laterality Date  . Anterior cervical decomp/discectomy fusion  ~ 1992  . Esophagogastroduodenoscopy    . Colonoscopy    . Zenker's diverticulectomy endoscopic  08/01/2013  . Breast biopsy Left     Prior to Admission medications   Medication Sig Start Date End Date Taking? Authorizing Provider  albuterol (PROVENTIL HFA;VENTOLIN HFA) 108 (90 Base) MCG/ACT inhaler Inhale 2 puffs into the lungs every 6 (six) hours as needed for wheezing or shortness of breath. 04/14/15  Yes Mikey Kirschner, MD  alendronate (FOSAMAX) 70 MG tablet TAKE 1 TABLET BY MOUTH ONCE A WEEK AS DIRECTED 09/01/15  Yes Mikey Kirschner, MD  aspirin EC 81 MG tablet Take 81 mg by mouth daily.   Yes Historical Provider, MD  Calcium-Vitamin D-Vitamin K (VIACTIV PO) Take 2 each by  mouth daily.   Yes Historical Provider, MD  Multiple Vitamins-Minerals (ONE-A-DAY 50 PLUS PO) Take 2 tablets by mouth daily.    Yes Historical Provider, MD  polyethylene glycol-electrolytes (TRILYTE) 420 g solution Take 4,000 mLs by mouth as directed. 09/08/15  Yes Daneil Dolin, MD  triamcinolone (NASACORT AQ) 55 MCG/ACT AERO nasal inhaler Place 2 sprays into the nose daily. Reported on 09/06/2015   Yes Historical Provider, MD  fexofenadine (ALLEGRA) 180 MG tablet Take 180 mg by mouth daily. Reported on 09/11/2015    Historical Provider, MD  peg 3350 powder (MOVIPREP) 100 g SOLR Take 1 kit (200 g total) by mouth as directed. 09/08/15   Daneil Dolin, MD    Allergies as of 09/08/2015 - Review Complete 09/06/2015  Allergen Reaction Noted  . Sulfamethoxazole-trimethoprim  04/19/2007  . Doxycycline Rash 05/21/2013    Family History  Problem Relation Age of Onset  . Allergies Mother   . Heart disease Mother   . Heart disease Father   . Stroke Father     Social History   Social History  . Marital Status: Married    Spouse Name: N/A  . Number of Children: 1  . Years of Education: N/A   Occupational History  . 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 Smoking status: Never Smoker   . Smokeless  tobacco: Never Used  . Alcohol Use: No  . Drug Use: No  . Sexual Activity: Not Currently   Other Topics Concern  . Not on file   Social History Narrative    Review of Systems: See HPI, otherwise negative ROS  Physical Exam: BP 128/69 mmHg  Pulse 80  Temp(Src) 97.7 F (36.5 C) (Oral)  Resp 21  Ht '5\' 4"'$  (1.626 m)  Wt 103 lb (46.72 kg)  BMI 17.67 kg/m2 General:   Alert,  Well-developed, well-nourished, pleasant and cooperative in NAD Head:  Normocephalic and atraumatic. Neck:  Supple; no masses or thyromegaly. Lungs:  Clear throughout to auscultation.   No wheezes, crackles, or rhonchi. No acute distress. Heart:  Regular rate and rhythm; no murmurs, clicks,  rubs,  or gallops. Abdomen:  Soft, nontender and nondistended. No masses, hepatosplenomegaly or hernias noted. Normal bowel sounds, without guarding, and without rebound.   Msk:  Symmetrical without gross deformities. Normal posture. Pulses:  Normal pulses noted. Extremities:  Without clubbing or edema.  Impression/Plan: Mary [name] is now here to undergo a screening colonoscopy.   Average risk screening examination  Risks, benefits, limitations, imponderables and alternatives regarding colonoscopy have been reviewed with the patient. Questions have been answered. All parties agreeable.     Notice:  This dictation was prepared with Dragon dictation along with smaller phrase technology. Any transcriptional errors that result from this process are unintentional and may not be corrected upon review.

## 2015-09-17 ENCOUNTER — Encounter (HOSPITAL_COMMUNITY): Payer: Self-pay | Admitting: Internal Medicine

## 2015-09-17 LAB — AFB CULTURE WITH SMEAR (NOT AT ARMC)

## 2015-10-25 ENCOUNTER — Ambulatory Visit (INDEPENDENT_AMBULATORY_CARE_PROVIDER_SITE_OTHER): Payer: Medicare Other | Admitting: Family Medicine

## 2015-10-25 ENCOUNTER — Ambulatory Visit: Payer: Medicare Other | Admitting: Family Medicine

## 2015-10-25 ENCOUNTER — Encounter: Payer: Self-pay | Admitting: Family Medicine

## 2015-10-25 VITALS — BP 102/60 | Temp 98.6°F | Ht 64.0 in | Wt 101.2 lb

## 2015-10-25 DIAGNOSIS — Z23 Encounter for immunization: Secondary | ICD-10-CM

## 2015-10-25 DIAGNOSIS — R300 Dysuria: Secondary | ICD-10-CM

## 2015-10-25 LAB — POCT URINALYSIS DIPSTICK: pH, UA: 6

## 2015-10-25 MED ORDER — CEFPROZIL 500 MG PO TABS: 500.0000 mg | ORAL_TABLET | Freq: Two times a day (BID) | ORAL | 0 refills | Status: DC

## 2015-10-25 NOTE — Progress Notes (Signed)
   Subjective:    Patient ID: Mary [name], female    DOB: 1946-02-08, 70 y.o.   MRN: [id number]  Urinary Tract Infection   This is a new problem. Episode onset: 2 days. Associated symptoms comments: Painful urination. She has tried increased fluids for the symptoms.   Patient also has history of bronchiectasis coughs a lot denies any wheezing or fever currently   Review of Systems Patient denies flank pain high fevers chills relate dysuria and frequent    Objective:   Physical Exam  Lungs clear hearts regular flank nontender abdomen soft      Assessment & Plan:  UA with WBCs Culture sent Antibiotics prescribed Warning signs discussed Pneumonia booster given today

## 2015-10-27 LAB — URINE CULTURE

## 2015-11-08 DIAGNOSIS — H16011 Central corneal ulcer, right eye: Secondary | ICD-10-CM | POA: Diagnosis not present

## 2015-11-10 ENCOUNTER — Encounter: Payer: Self-pay | Admitting: Emergency Medicine

## 2015-11-10 ENCOUNTER — Ambulatory Visit (INDEPENDENT_AMBULATORY_CARE_PROVIDER_SITE_OTHER): Payer: Medicare Other | Admitting: Emergency Medicine

## 2015-11-10 DIAGNOSIS — J479 Bronchiectasis, uncomplicated: Secondary | ICD-10-CM | POA: Diagnosis not present

## 2015-11-10 NOTE — Progress Notes (Signed)
Patient ID: Mary [name], female    DOB: 1945/04/11, 70 y.o.   MRN: [id number] HPI [name] is a 70 year old woman whom I have followed for chronic cough. 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 Evaluated 2011 by Dr Maple Hudson for allergies - shows multiple sensitivities that are present year round.   ROV 07/12/09 -- returns for f/u. Tells me that her PND has actually improved since last visit. Her regimen is benadryl  two times a day, NSW two times a day, flutter valve two times a day - sometimes productive but often not. Also on rotating doxy + azithromycin. No flares of bronchiectasis since last visit. She is contemplating starting allergy shots, hopefully can get them from her PCP.   ROV 10/01/09 -- f/u bronchiectasis, cough. Flare that started about a week ago. Began treatment with Avelox and set up this follow up. Was exposed to husband who had a URI. Changed her benadryl to Claritin D. She has not started allergy shots yet. Starting to feel better, not back to baseline. never started Singulair.   ROV 01/24/10 -- Hx chronic cough, bronchiectasis. Tried daliresp, couldn't tolerate due to insomnia. Still with clear drainage, never responded to treatment with nasal steroid. On benadryl qhs. Taking alternating doxy and azithro monthly. Considering starting allergy shots - discussing w Dr Maple Hudson.   ROV 08/05/10 -- bronchiectasis, chronic cough, severe allergic rhinitis. Doing NSW qd and flutter qd.  She is on allergy shots and nasal gtt may be better. She is alternating doxy with azithro at the beginning of every month. Her cough is predicatble - morning and afternoon. Worst if she has been off abx for an extended period. Last CT scan was 2008.   10/19/10-  11 yo F  Never smoker followed for bronchiectasis/ hx M.avium, chronic cough, severe allergic rhinitis Started allergy vaccine in January, now at 1:50 with occasional minimal  local soreness but no problems. Says the spring pollen season was "rough"- increased nasal stuffiness and cough. Has been better in last 2 months. Still takes benadryl once each night. Hx of trying many nasal sprays. Still some nasal congestion and drip at times. She is interested in getting her shots in [place] at her PCP. allergy profile- IgE 2.4, negative common allergens. Negative Fungal antibody panel  01/20/11-  62 yo F  Never smoker followed for bronchiectasis/ hx M.avium, chronic cough (Dr Delton Coombes), severe allergic rhinitis She returns for followup of her allergy vaccine therapy begun in January 2012. She says she had 2 good weeks at the beginning of September but now feels nasal congestion in the morning with watery rhinorrhea later in the day "like a cold". She has not been taking antihistamines very often, but prefers Benadryl. Cough is productive of clear to yellow sputum with no blood or fever. Dr Delton Coombes is alternating antibiotics. Flu shot is pending with her primary physician  ROV 05/05/11 -- bronchiectasis, chronic cough, severe allergic rhinitis. Hx MAIC and also MRSA. She has started allergy shots w Dr Maple Hudson. She still has congestion and drainage, assoc w dry cough. She is alternating abx (doxy/azithro) beginning every month. The cough and sputum is bad between abx and she often has to start early. Doxy is better than azithro, but it is taking longer for it to be effective. She is wondering if she might be having some reflux.   ROV 06/21/11 -- bronchiectasis w hx MAIC + MRSA, chronic cough both due to bronchiectasis and to  UA irritation, severe allergic rhinitis. Returns after sputum cx >> shows MAIC, sensitivities not yet available. She continues to have cough that is most productive in the am and at the end of the day.   ROV 08/04/11 -- bronchiectasis w hx MAIC + MRSA, chronic cough both due to bronchiectasis and to UA irritation, severe allergic rhinitis. We [facility] clarithro [facility]  [facility], started it on 4/15. She has the bitter taste in her mouth, had this last time. I had thought this was due to rifabutin, but possibly due to clarithro. Not currently on doxy. Since last time she thinks she had URI, now probably back to baseline.   ROV 11/13/11 -- bronchiectasis w hx MAIC + MRSA, chronic cough both due to bronchiectasis and to UA irritation, severe allergic rhinitis. Has been on clarithro/ethambutol since 07/17/11. She has less energy on the abx. She continues to have cough that is productive of either green or clear. The cough is most bothersome at night, can wake her from sleep. She coughs every morning - clears secretions. Then around mid-day she has another bout, then again in the evening.    ROV 01/22/12 -- bronchiectasis w hx MAIC + MRSA, chronic cough both due to bronchiectasis and to UA irritation, severe allergic rhinitis. [facility] April '[facility]. She finished the abx 01/12/12 (6 months). She continues to have cough and UA irritation.  Her nasal gtt is better but not gone; has never benefited from nasal steroid, astelin. Needs repeat CT scan 08/2012.   07/03/12- 70 yo F  Never smoker followed for bronchiectasis/ hx M.avium, chronic cough (Dr Delton CoombesByrum), severe allergic rhinitis FOLLOWS FOR: sneezing and runny nose; cough and drainage as well. Still on allergy vaccine Missed her allergy shots 1:10 GH for month of March after fall with cracked pelvis.  Incvreased sneeze, watery rhinorhea, much post-nasal drip. No headache, ears, purulent. Blames PND for increased cough from her bronchiectasis.  Taking a Sudafed-PE product containing an antihistamine, nasal saline irrigation.  ROV 07/24/12 -- bronchiectasis w hx MAIC + MRSA, chronic cough both due to bronchiectasis and to UA irritation, severe allergic rhinitis. She is on immunotherapy per Dr Maple HudsonYoung, doing NSW's. Had never benefited from nasal steroid or astelin in the past. Started dymista  on 4/2 from dr Maple HudsonYoung. Also had Ct scan sinuses > all clear 07/16/12.  She unfortunately fell and broke her pelvis in early March. Her cough continues, still yellow/green/brown. Occasionally some blood from nose or sputum. Breathing stable. Not currently on rotating abx.   ROV 09/25/12 -- bronchiectasis w hx MAIC + MRSA, chronic cough both due to bronchiectasis and to UA irritation, severe allergic rhinitis. We restarted rotating abx after last visit. She notes that sputum is no longer as colored, still thick. Alternating doxy and clarithro.  She has also been on Keflex long term for recurrent UTI's. She is back on allergy shots.   ROV 04/09/13 -- bronchiectasis w hx MAIC + MRSA, chronic cough both due to bronchiectasis and to UA irritation, severe allergic rhinitis. She has followed with Dr Maple HudsonYoung and receiving allergy shots. She has persistent sx, but recently also had a URI which made everything even worse. She has noticed that she got a skin rash associated with her doxycycline. She is having some problems swallowing - things getting hung up in he throat, difficulty taking clarithro.   ROV 05/21/13 -- bronchiectasis w hx MAIC + MRSA, chronic cough both due to bronchiectasis and to  UA irritation, severe allergic rhinitis. Last time treated with levaquin, stopped rotating abx. She has continued persistent cough, every day all day. Often not productive. She has seen some blood in sputum for last 2 weeks. Her barium study showed a zenker's diverticulum, small HH, some dilation. She has at times coughed enough to bring up small food particles, but she distinguishes this from true emesis. She tried albuterol some - mixed results.   ROV 09/10/13 -- Bronchiectasis w hx MAIC + MRSA, chronic cough both due to bronchiectasis and to UA irritation, severe allergic rhinitis. She is on max therapy for allergies including immunotherapy.  She recently underwent zenker's diverticulotomy on 08/01/13 with Dr Jenne Pane. She has improved  remarkably. She does still cough some, less productive and much less frequent. She is off nasacort, omeprazole, allergra right now.   ROV 12/03/13 -- Bronchiectasis w hx MAIC + MRSA, chronic cough both due to bronchiectasis and to UA irritation, severe allergic rhinitis.  She has been coughing more and seeing darker mucous, often with blood. She is not on abx right now.     ROV 04/01/14 -- f/u visit for bronchiectasis. She was treated with levaquin last time for a flare. She is back to her baseline - coughs for 30 minutes in the am, then around 1pm she starts up again for the rest of the day. She is still seeing blood. She describes some swallowing problems > takes 2 swallows to get her food down, sometimes lodges. She has a hx Zenker's diverticulum and repair.   ROV 09/09/14 -- follow-up visit for bronchiectasis and chronic cough that is likely multifactorial. 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 On 07/22/14 she underwent a barium swallow which I have reviewed today. It showed no change in her lower cervical lateral esophageal diverticulum. Some residual barium remained in that diverticulum. She has a small hiatal hernia and some associated GERD. She has seen Dr Jenne Pane, tried increased dose prilosec and she saw some improvement. She is currently off. She coughs in the am, clears mucous. Then she has more cough around lunch. She is on anti- histamine and a nasal steroid  ROV 12/02/14 -- follow up visit for chronic cough, bronchiectasis, GERD and hiatal hernia.   Last visit we started a trial of Symbicort, to see if she would benefit. Her cough is better, but unclear whether this is because she was improving already. Not sure we can ascribe to the symbicort. She is on Actuary. No change in exertional tolerance. Has intermittent nasal congestion.   ROV 05/05/15 -- history of bronchiectasis, chronic cough, hiatal hernia and GERD. She also has Zenker's  diverticulum. She was recently treated after Xmas and then again in early January, more sinus drainage, dyspnea, wheeze.  Much more purulent sputum. Was treated with azithro and then clarithro. Returned to baseline. She is not going to take allergy shots anymore, she uses nasal steroid prn, on allegra qd.  Restarted omeprazole while she was flaring, but not currently using.   ROV 08/04/15 -- follow-up visit for history chronic cough, allergic rhinitis, GERD with admitted complications of a hiatal hernia and a Zenker's diverticulum. She also has associated bronchiectasis. Last time we stopped her allergy shots, she seems to have tolerated. She is still having every day cough, clear in the am, colored sputum in the pm. No longer on rotating abx, these had decreased efficacy and she developed an allergy. She is on allergy regimen.  No  GERD meds right now.   ROV 11/10/15 -- Patient has a history of allergic rhinitis, GERD, chronic cough. Also with associated bronchiectasis And history of Mycobacterium avium, MRSA colonization. We have repeated her CT scan of the chest on 08/16/15 and have reviewed the results. It shows continued diffuse bronchiectasis with peribronchial nodularity and bronchial wall thickening, at least one nodule that shows some evidence of mild cavitation. We checked an AFB culture 08/04/15 that was negative.                                                                                                                                                                                                                                                    Vitals:   11/10/15 1357 11/10/15 1358  BP:  110/66  BP Location:  Left Arm  Cuff Size:  Normal  Pulse:  66  SpO2:  95%  Weight: 101 lb (45.8 kg)   Height: 5\' 4"  (1.626 m)    Gen: Pleasant, well-nourished, in no distress,  normal affect  ENT: No lesions,  mouth clear,  oropharynx clear, no postnasal drip  Neck: No JVD, no TMG, no carotid  bruits  Lungs: No use of accessory muscles, no dullness to percussion, clear without rales or rhonchi  Cardiovascular: RRR, heart sounds normal, no murmur or gallops, no peripheral edema  Musculoskeletal: No deformities, no cyanosis or clubbing  Neuro: alert, non focal  Skin: Warm, no lesions or rashes  08/16/15 --  COMPARISON:  09/27/2012.  FINDINGS: Mediastinum / Lymph Nodes: There is no axillary lymphadenopathy. 9 mm short axis precarinal lymph node is unchanged. 6 mm short axis subcarinal lymph node is stable. No evidence for gross hilar lymphadenopathy although assessment is limited by the lack of intravenous contrast on today's study.Ascending thoracic aorta now measures 3.4 cm in diameter compared to 3.2 cm previously. The heart size is normal. No pericardial effusion. Coronary artery calcification is noted. The esophagus has normal imaging features.  Lungs / Pleura: As before, there is prominent bronchiectasis in the right middle lobe, lingula, and both lower lobes. This is associated with bronchial wall thickening and numerous areas of prominent airway impaction, mainly involving the right middle and lower lobes with some impacted airways noted in the posterior left lower lobe. There is associated micro nodularity in the areas of bronchiectasis, mainly following  a peribronchovascular distribution with some fluctuation in the appearance of the nodules between this study and the prior. Patient does have a dominant, spiculated 8 x 13 mm cavitary nodule in the right upper lobe (image 47 series 5). This nodule was present previously but has progressed. Another fairly dominant 5 x 9 mm spiculated nodule in the posterior right middle lobe (image 54 series 5) has also progressed.  Upper Abdomen: Visualized portions of the liver and spleen have normal uninfused appearance. No evidence for adrenal nodule or mass. Atherosclerotic calcification is noted in the abdominal  aorta.  MSK / Soft Tissues: Bone windows reveal no worrisome lytic or sclerotic osseous lesions.  IMPRESSION: 1. As before, there is diffuse bronchiectasis in the mid and lower lungs with peribronchial nodularity, bronchial wall thickening, and airway impaction. Imaging features are most suggestive of atypical infection. The patient does have some nodules which have become more dominant, 1 of which shows cavitation. As such, closer interval follow-up is recommended to ensure that these remain stable. Consider repeat CT chest in 3-6 months. 2. Stable upper normal lymph nodes in the mediastinum. 3. Coronary artery and abdominal aortic atherosclerosis.   BRONCHIECTASIS Continues to have significant symptoms and to produce purulent secretions frequently through the day. Her CT scan of the chest shows continued bronchiectasis with some slight increase in nodular opacities consistent with probable smoldering infection and mucus impaction. Her AFB as we go forward was negative. At this time I will defer bronchoscopy but this may be necessary As we go forward. We will restart Mucinex, restart flutter valve. We will try scheduling her albuterol see if this helps with secretion clearance. We will need to consider suppressive nebulizer therapy, ibuprofen, cycled antibiotics at some point in the future. I do not want to use antibiotics indiscriminately because she is at risk for developing resistance.    Levy Pupa, MD, PhD 11/10/2015, 2:32 PM [facility] [phone] or if no answer [phone]

## 2015-11-10 NOTE — Patient Instructions (Addendum)
Please start mucinex 1200mg  daily Drink lots of water Try using your albuterol 2 puffs three time a day to see if this helps with secretion clearance.  Try using your flutter valve daily.  Follow with Dr Delton CoombesByrum in 4 months or sooner if you have any problems.

## 2015-11-10 NOTE — Assessment & Plan Note (Signed)
Continues to have significant symptoms and to produce purulent secretions frequently through the day. Her CT scan of the chest shows continued bronchiectasis with some slight increase in nodular opacities consistent with probable smoldering infection and mucus impaction. Her AFB as we go forward was negative. At this time I will defer bronchoscopy but this may be necessary As we go forward. We will restart Mucinex, restart flutter valve. We will try scheduling her albuterol see if this helps with secretion clearance. We will need to consider suppressive nebulizer therapy, ibuprofen, cycled antibiotics at some point in the future. I do not want to use antibiotics indiscriminately because she is at risk for developing resistance.

## 2015-12-22 ENCOUNTER — Ambulatory Visit: Payer: Medicare Other | Admitting: Internal Medicine

## 2015-12-27 ENCOUNTER — Ambulatory Visit: Payer: Medicare Other | Admitting: Internal Medicine

## 2016-01-19 ENCOUNTER — Encounter: Payer: Self-pay | Admitting: Family Medicine

## 2016-01-19 ENCOUNTER — Ambulatory Visit (INDEPENDENT_AMBULATORY_CARE_PROVIDER_SITE_OTHER): Payer: Medicare Other | Admitting: Family Medicine

## 2016-01-19 VITALS — BP 120/66 | Temp 98.0°F | Ht 64.0 in | Wt 102.4 lb

## 2016-01-19 DIAGNOSIS — J329 Chronic sinusitis, unspecified: Secondary | ICD-10-CM

## 2016-01-19 DIAGNOSIS — J470 Bronchiectasis with acute lower respiratory infection: Secondary | ICD-10-CM

## 2016-01-19 DIAGNOSIS — R062 Wheezing: Secondary | ICD-10-CM | POA: Diagnosis not present

## 2016-01-19 MED ORDER — AMOXICILLIN-POT CLAVULANATE 875-125 MG PO TABS: 1.0000 | ORAL_TABLET | Freq: Two times a day (BID) | ORAL | 0 refills | Status: DC

## 2016-01-19 NOTE — Progress Notes (Signed)
   Subjective:    Patient ID: Mary [name], female    DOB: 1945/09/13, 70 y.o.   MRN: [id number]  Sinusitis  This is a new problem. The current episode started in the past 7 days. The problem is unchanged. The pain is moderate. Associated symptoms include congestion and coughing. Treatments tried: Allegra, Mucinex. The treatment provided no relief.   Patient has a place on her finger she wants the doctor to look at.   Pos cong and cough hit hard  Started around four days ago  Headache and stopeped up,  Sig coughing and low gr fever  Coughed a lot   sleve and muc dm      Review of Systems  HENT: Positive for congestion.   Respiratory: Positive for cough.        Objective:   Physical Exam  Alert mild malaise H&T moderate his congestion plus minus frontal tenderness pharynx normal lungs bilateral wheezes heart regular rhythm inspiratory crackles at base of lungs which is stable exam      Assessment & Plan:  Impression sinusitis bronchitis with exacerbation of reactive airways and underlying bronchiectasis plan antibiotics prescribed. Symptom care discussed. Use albuterol regularly patient warning signs discussed WSL

## 2016-02-02 ENCOUNTER — Ambulatory Visit (INDEPENDENT_AMBULATORY_CARE_PROVIDER_SITE_OTHER): Payer: Medicare Other | Admitting: Family Medicine

## 2016-02-02 ENCOUNTER — Encounter: Payer: Self-pay | Admitting: Family Medicine

## 2016-02-02 VITALS — BP 130/64 | Temp 98.4°F | Ht 64.0 in | Wt 104.5 lb

## 2016-02-02 DIAGNOSIS — Z87440 Personal history of urinary (tract) infections: Secondary | ICD-10-CM | POA: Diagnosis not present

## 2016-02-02 DIAGNOSIS — J470 Bronchiectasis with acute lower respiratory infection: Secondary | ICD-10-CM

## 2016-02-02 DIAGNOSIS — R300 Dysuria: Secondary | ICD-10-CM | POA: Diagnosis not present

## 2016-02-02 DIAGNOSIS — Z23 Encounter for immunization: Secondary | ICD-10-CM

## 2016-02-02 LAB — POCT URINALYSIS DIPSTICK: PH UA: 7

## 2016-02-02 MED ORDER — NITROFURANTOIN MONOHYD MACRO 100 MG PO CAPS: 100.0000 mg | ORAL_CAPSULE | Freq: Two times a day (BID) | ORAL | 0 refills | Status: DC

## 2016-02-02 NOTE — Progress Notes (Signed)
   Subjective:    Patient ID: Mary [name], female    DOB: 04/13/1945, 70 y.o.   MRN: [id number]  Dysuria   This is a new problem. The current episode started today. The problem occurs intermittently. The problem has been unchanged. The quality of the pain is described as burning. The pain is moderate. There has been no fever. She has tried increased fluids for the symptoms. The treatment provided no relief.   Patient states that she still has some congestion and cough also. Recently completed an antibiotic for these symptoms.  freq UTI's and has had one virtually every few months   Patient expresses ongoing frustration about frequent urinary tract infections. Saw Dr. Maryjane Hurtertto will and in the past would prefer not to see him again.  Ongoing congestion drainage. This is chronic and the patient has literally been face for years   Review of Systems  Genitourinary: Positive for dysuria.       Objective:   Physical Exam Alert vitals stable HET Mondays congestion lungs bilateral bronchiectasis heart regular in rhythm no CVA tenderness. Next  Urinalysis numerous white blood cells       Assessment & Plan:  Impression frequent urinary tract infection occurring every several months. Patient saw Dr. Rayetta HumphreyTT E LI and in the past and would not like to see him again. Realizes she needs to follow-up with the urology specialist #2 chronic rhinitis #3 bronchiectasis discussed plan options discussed. 25 minutes spent most in discussion culture urine. Macrobid 100 twice a day 7 days. Flu shot. Referral to urologist

## 2016-02-04 LAB — URINE CULTURE

## 2016-02-10 ENCOUNTER — Encounter: Payer: Self-pay | Admitting: Family Medicine

## 2016-02-21 ENCOUNTER — Other Ambulatory Visit: Payer: Self-pay | Admitting: Family Medicine

## 2016-02-28 DIAGNOSIS — N390 Urinary tract infection, site not specified: Secondary | ICD-10-CM | POA: Diagnosis not present

## 2016-02-28 DIAGNOSIS — N9489 Other specified conditions associated with female genital organs and menstrual cycle: Secondary | ICD-10-CM | POA: Diagnosis not present

## 2016-02-28 DIAGNOSIS — Z87440 Personal history of urinary (tract) infections: Secondary | ICD-10-CM | POA: Diagnosis not present

## 2016-03-06 ENCOUNTER — Ambulatory Visit (INDEPENDENT_AMBULATORY_CARE_PROVIDER_SITE_OTHER): Payer: Medicare Other | Admitting: Family Medicine

## 2016-03-06 ENCOUNTER — Encounter: Payer: Self-pay | Admitting: Family Medicine

## 2016-03-06 VITALS — BP 128/70 | Temp 98.5°F | Ht 64.5 in | Wt 106.0 lb

## 2016-03-06 DIAGNOSIS — N390 Urinary tract infection, site not specified: Secondary | ICD-10-CM

## 2016-03-06 DIAGNOSIS — R300 Dysuria: Secondary | ICD-10-CM | POA: Diagnosis not present

## 2016-03-06 LAB — POCT URINALYSIS DIPSTICK
PH UA: 5
Spec Grav, UA: <=1.005

## 2016-03-06 MED ORDER — CEFPROZIL 500 MG PO TABS: 500.0000 mg | ORAL_TABLET | Freq: Two times a day (BID) | ORAL | 0 refills | Status: DC

## 2016-03-06 NOTE — Progress Notes (Signed)
   Subjective:    Patient ID: Mary [name], female    DOB: 1945/08/30, 70 y.o.   MRN: [id number]  Dysuria   This is a recurrent problem. Associated symptoms include frequency and urgency. Treatments tried: macrobid, azo, increased water, saw specialist on monday.   Patient urinary frequency over the past few days denies severe pain nausea vomiting diarrhea has history of UTIs [facility] those notes were reviewed in the presence of the patient   Review of Systems  Constitutional: Negative for fatigue and fever.  Gastrointestinal: Negative for abdominal pain.  Genitourinary: Positive for dysuria, frequency and urgency.       Objective:   Physical Exam  Constitutional: She appears well-developed.  HENT:  Head: Normocephalic.  Pulmonary/Chest: Effort normal. She has no wheezes.  Abdominal: Soft. She exhibits no distension. There is no tenderness.          Assessment & Plan:  Frequent urinary tract infections Cefzil twice a day next 5 days Use medicine for 10 days if necessary Culture sent await results

## 2016-03-08 LAB — URINE CULTURE

## 2016-03-13 ENCOUNTER — Other Ambulatory Visit: Payer: Self-pay | Admitting: Obstetrics & Gynecology

## 2016-03-13 DIAGNOSIS — Z124 Encounter for screening for malignant neoplasm of cervix: Secondary | ICD-10-CM | POA: Diagnosis not present

## 2016-03-13 DIAGNOSIS — Z681 Body mass index (BMI) 19 or less, adult: Secondary | ICD-10-CM | POA: Diagnosis not present

## 2016-03-14 LAB — CYTOLOGY - PAP

## 2016-03-15 ENCOUNTER — Ambulatory Visit: Payer: Medicare Other | Admitting: Emergency Medicine

## 2016-04-14 DIAGNOSIS — N390 Urinary tract infection, site not specified: Secondary | ICD-10-CM | POA: Diagnosis not present

## 2016-04-14 DIAGNOSIS — N952 Postmenopausal atrophic vaginitis: Secondary | ICD-10-CM | POA: Diagnosis not present

## 2016-04-14 DIAGNOSIS — K5901 Slow transit constipation: Secondary | ICD-10-CM | POA: Diagnosis not present

## 2016-04-21 DIAGNOSIS — Z1231 Encounter for screening mammogram for malignant neoplasm of breast: Secondary | ICD-10-CM | POA: Diagnosis not present

## 2016-04-24 ENCOUNTER — Encounter: Payer: Self-pay | Admitting: Emergency Medicine

## 2016-04-24 ENCOUNTER — Other Ambulatory Visit: Payer: Self-pay | Admitting: Family Medicine

## 2016-04-24 ENCOUNTER — Ambulatory Visit (INDEPENDENT_AMBULATORY_CARE_PROVIDER_SITE_OTHER): Payer: Medicare Other | Admitting: Emergency Medicine

## 2016-04-24 VITALS — BP 104/54 | HR 67 | Ht 64.0 in | Wt 106.0 lb

## 2016-04-24 DIAGNOSIS — R059 Cough, unspecified: Secondary | ICD-10-CM

## 2016-04-24 DIAGNOSIS — R05 Cough: Secondary | ICD-10-CM

## 2016-04-24 DIAGNOSIS — J479 Bronchiectasis, uncomplicated: Secondary | ICD-10-CM | POA: Diagnosis not present

## 2016-04-24 NOTE — Progress Notes (Signed)
Patient ID: Mary [name], female    DOB: 1945-08-23, 71 y.o.   MRN: [id number] HPI Mary [name] is a 71 year old woman whom I have followed for chronic cough. 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 Evaluated 2011 by Mary Mary [name] for allergies - shows multiple sensitivities that are present year round.    ROV 09/09/14 -- follow-up visit for bronchiectasis and chronic cough that is likely multifactorial. 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 On 07/22/14 she underwent a barium swallow which I have reviewed today. It showed no change in her lower cervical lateral esophageal diverticulum. Some residual barium remained in that diverticulum. She has a small hiatal hernia and some associated GERD. She has seen Mary [name], tried increased dose prilosec and she saw some improvement. She is currently off. She coughs in the am, clears mucous. Then she has more cough around lunch. She is on anti- histamine and a nasal steroid  ROV 12/02/14 -- follow up visit for chronic cough, bronchiectasis, GERD and hiatal hernia.   Last visit we started a trial of Symbicort, to see if she would benefit. Her cough is better, but unclear whether this is because she was improving already. Not sure we can ascribe to the symbicort. She is on Actuary. No change in exertional tolerance. Has intermittent nasal congestion.   ROV 05/05/15 -- history of bronchiectasis, chronic cough, hiatal hernia and GERD. She also has Zenker's diverticulum. She was recently treated after Xmas and then again in early January, more sinus drainage, dyspnea, wheeze.  Much more purulent sputum. Was treated with azithro and then clarithro. Returned to baseline. She is not going to take allergy shots anymore, she uses nasal steroid prn, on allegra qd.  Restarted omeprazole while she was flaring, but not currently using.   ROV 08/04/15 -- follow-up  visit for history chronic cough, allergic rhinitis, GERD with admitted complications of a hiatal hernia and a Zenker's diverticulum. She also has associated bronchiectasis. Last time we stopped her allergy shots, she seems to have tolerated. She is still having every day cough, clear in the am, colored sputum in the pm. No longer on rotating abx, these had decreased efficacy and she developed an allergy. She is on allergy regimen.  No GERD meds right now.   ROV 11/10/15 -- Patient has a history of allergic rhinitis, GERD, chronic cough. Also with associated bronchiectasis And history of Mycobacterium avium, MRSA colonization. We have repeated her CT scan of the chest on 08/16/15 and have reviewed the results. It shows continued diffuse bronchiectasis with peribronchial nodularity and bronchial wall thickening, at least one nodule that shows some evidence of mild cavitation. We checked an AFB culture 08/04/15 that was negative.       ROV 04/24/16 --  this is a follow-up visit for complicated chronic cough. 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  She continues to have cough. May be worst in the am, sometimes needs to clear plugs.  She is having sinus congestion; she goes on / off a nasal steroid, currently off.  Seems to benefit from albuterol, using a few times a  Day.  Vitals:   04/24/16 1151  BP: (!) 104/54  BP Location: Left Arm  Cuff Size: Normal  Pulse: 67  SpO2: 97%  Weight: 106 lb (48.1 kg)  Height: 5\' 4"  (1.626 m)   Gen: Pleasant, well-nourished, in no distress,  normal affect  ENT: No lesions,  mouth clear,  oropharynx clear, no postnasal drip  Neck: No JVD, no TMG, no carotid bruits  Lungs: No use of  accessory muscles, no dullness to percussion, clear without rales or rhonchi  Cardiovascular: RRR, heart sounds normal, no murmur or gallops, no peripheral edema  Musculoskeletal: No deformities, no cyanosis or clubbing  Neuro: alert, non focal  Skin: Warm, no lesions or rashes  08/16/15 --  COMPARISON:  09/27/2012.  FINDINGS: Mediastinum / Lymph Nodes: There is no axillary lymphadenopathy. 9 mm short axis precarinal lymph node is unchanged. 6 mm short axis subcarinal lymph node is stable. No evidence for gross hilar lymphadenopathy although assessment is limited by the lack of intravenous contrast on today's study.Ascending thoracic aorta now measures 3.4 cm in diameter compared to 3.2 cm previously. The heart size is normal. No pericardial effusion. Coronary artery calcification is noted. The esophagus has normal imaging features.  Lungs / Pleura: As before, there is prominent bronchiectasis in the right middle lobe, lingula, and both lower lobes. This is associated with bronchial wall thickening and numerous areas of prominent airway impaction, mainly involving the right middle and lower lobes with some impacted airways noted in the posterior left lower lobe. There is associated micro nodularity in the areas of bronchiectasis, mainly following a peribronchovascular distribution with some fluctuation in the appearance of the nodules between this study and the prior. Patient does have a dominant, spiculated 8 x 13 mm cavitary nodule in the right upper lobe (image 47 series 5). This nodule was present previously but has progressed. Another fairly dominant 5 x 9 mm spiculated nodule in the posterior right middle lobe (image 54 series 5) has also progressed.  Upper Abdomen: Visualized portions of the liver and spleen have normal uninfused appearance. No evidence for adrenal nodule or mass. Atherosclerotic calcification is noted in the abdominal aorta.  MSK / Soft Tissues:  Bone windows reveal no worrisome lytic or sclerotic osseous lesions.  IMPRESSION: 1. As before, there is diffuse bronchiectasis in the mid and lower lungs with peribronchial nodularity, bronchial wall thickening, and airway impaction. Imaging features are most suggestive of atypical infection. The patient does have some nodules which have become more dominant, 1 of which shows cavitation. As such, closer interval follow-up is recommended to ensure that these remain stable. Consider repeat CT chest in 3-6 months. 2. Stable upper normal lymph nodes in the mediastinum. 3. Coronary artery and abdominal aortic atherosclerosis.   BRONCHIECTASIS As above her cough is complicated. I believe a repeat Ct scan will help us determine whether we need to focus mor on bronchihiectasis eval and rx for MAIc. Will repeat in may, follow to review.   Please restart your nasal steroid spray when you are able to tolerate this.  We will plan to repeat your CT Scan in May 2018 Take albuterol 2 puffs up to every 4 hours if needed for shortness of breath.  Follow with Mary Delton CoombesByrum in May after the CT Scan to review, sooner if you have any problems.     Levy Pupaobert Clytie Shetley, MD, PhD 04/24/2016, 12:35 PM [facility] [phone] or if no answer [phone]

## 2016-04-24 NOTE — Patient Instructions (Addendum)
Please restart your nasal steroid spray when you are able to tolerate this.  We will plan to repeat your CT Scan in May 2018 Take albuterol 2 puffs up to every 4 hours if needed for shortness of breath.  Follow with Dr Delton CoombesByrum in May after the CT Scan to review, sooner if you have any problems.

## 2016-04-24 NOTE — Assessment & Plan Note (Signed)
As above her cough is complicated. I believe a repeat Ct scan will help us determine whether we need to focus mor on bronchihiectasis eval and rx for MAIc. Will repeat in may, follow to review.   Please restart your nasal steroid spray when you are able to tolerate this.  We will plan to repeat your CT Scan in May 2018 Take albuterol 2 puffs up to every 4 hours if needed for shortness of breath.  Follow with Dr Delton CoombesByrum in May after the CT Scan to review, sooner if you have any problems.

## 2016-04-24 NOTE — Telephone Encounter (Signed)
Last filled 02/21/16. Would you like to refill?

## 2016-05-08 DIAGNOSIS — H35363 Drusen (degenerative) of macula, bilateral: Secondary | ICD-10-CM | POA: Diagnosis not present

## 2016-06-05 ENCOUNTER — Ambulatory Visit (INDEPENDENT_AMBULATORY_CARE_PROVIDER_SITE_OTHER): Payer: Medicare Other | Admitting: Family Medicine

## 2016-06-05 ENCOUNTER — Encounter: Payer: Self-pay | Admitting: Family Medicine

## 2016-06-05 VITALS — BP 128/72 | Temp 97.5°F | Ht 64.0 in | Wt 105.8 lb

## 2016-06-05 DIAGNOSIS — J329 Chronic sinusitis, unspecified: Secondary | ICD-10-CM

## 2016-06-05 MED ORDER — PREDNISONE 10 MG PO TABS: ORAL_TABLET | ORAL | 0 refills | Status: DC

## 2016-06-05 MED ORDER — AMOXICILLIN-POT CLAVULANATE 875-125 MG PO TABS: 1.0000 | ORAL_TABLET | Freq: Two times a day (BID) | ORAL | 0 refills | Status: DC

## 2016-06-05 NOTE — Progress Notes (Signed)
   Subjective:    Patient ID: Mary RoyaltySandra M Hankinson, female    DOB: [birth year], 71 y.o.   MRN: [id number]  Cough  This is a new problem. The current episode started in the past 7 days. Associated symptoms include ear pain, nasal congestion, a sore throat and wheezing. Treatments tried: otc meds.   Started around thur  Cough and cong and tuffiness  Had thought maybe allergy  Uses allegra and flonase, taking reg  Nose still running  Throat scratchy   Used salt water, throat eased up,sinus cong and stuffiness  And tightness at times    Review of Systems  HENT: Positive for ear pain and sore throat.   Respiratory: Positive for cough and wheezing.        Objective:   Physical Exam  Alert, mild malaise. Hydration good Vitals stable. frontal/ maxillary tenderness evident positive nasal congestion. pharynx normal neck supple  lungs clear/no crackles Other than moderate wheezes. heart regular in rhythm       Assessment & Plan:  Impression rhinosinusitis likely post viralPlus exacerbation of reactive airways and bronchiectasis, discussed with patient. plan antibiotics prescribed. Questions answered. Symptomatic care discussed. warning signs discussed. WSL

## 2016-06-09 DIAGNOSIS — Z87440 Personal history of urinary (tract) infections: Secondary | ICD-10-CM | POA: Diagnosis not present

## 2016-06-09 DIAGNOSIS — N952 Postmenopausal atrophic vaginitis: Secondary | ICD-10-CM | POA: Diagnosis not present

## 2016-06-09 DIAGNOSIS — K5901 Slow transit constipation: Secondary | ICD-10-CM | POA: Diagnosis not present

## 2016-06-12 ENCOUNTER — Telehealth: Payer: Self-pay | Admitting: Family Medicine

## 2016-06-12 MED ORDER — BENZONATATE 100 MG PO CAPS: 100.0000 mg | ORAL_CAPSULE | Freq: Four times a day (QID) | ORAL | 0 refills | Status: DC | PRN

## 2016-06-12 NOTE — Telephone Encounter (Signed)
Patient would like tess[facility] perles sent [facility]. Prescription sent electronically to pharmacy. Patient notified

## 2016-06-12 NOTE — Telephone Encounter (Signed)
Hycodan three oz one tspn qhs prn for cough  Or tess perles 100 mg numb 30 one q six hr prn,  Whichever pt prefers(or even both)

## 2016-06-12 NOTE — Telephone Encounter (Signed)
Patient requesting something for bad cough to be called [facility]

## 2016-06-12 NOTE — Telephone Encounter (Signed)
patient seen 06/05/16 for sinusitis and given augmentin

## 2016-07-05 ENCOUNTER — Other Ambulatory Visit: Payer: Self-pay | Admitting: *Deleted

## 2016-07-05 MED ORDER — OSELTAMIVIR PHOSPHATE 75 MG PO CAPS: 75.0000 mg | ORAL_CAPSULE | Freq: Two times a day (BID) | ORAL | 0 refills | Status: DC

## 2016-07-06 IMAGING — CT CT CHEST W/O CM
2 of 3 series · 15 of 36 positions shown, 18 images · non-contrast
Comparison: 09/27/2012.

CLINICAL DATA: Chronic shortness of Breath for 1 year. History of
COPD and bronchiectasis.

EXAM:
CT CHEST WITHOUT CONTRAST
TECHNIQUE: Multidetector CT imaging of the chest was performed following the
standard protocol without IV contrast.

[Series 2: chestroutine 2.0 b40f · axial · 0.62mm/px · z∈[+732,+1000]mm · 12 of 158 slices shown, 15 images]
[im 12/158  mediastinal]
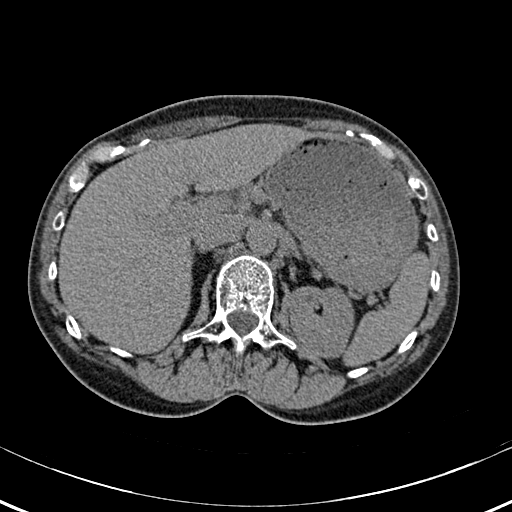
[im 12/158  lung]
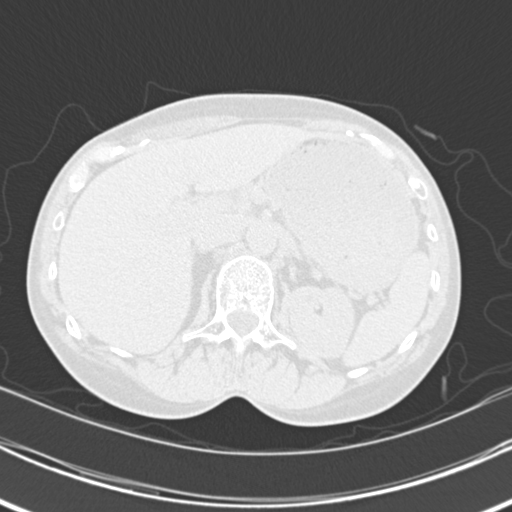
[im 24/158  lung]
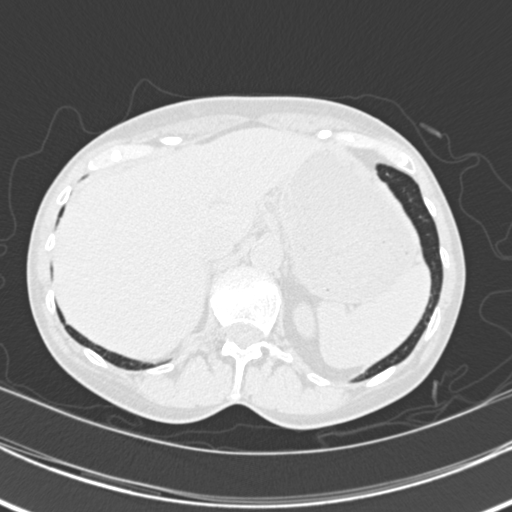
[im 35/158  lung]
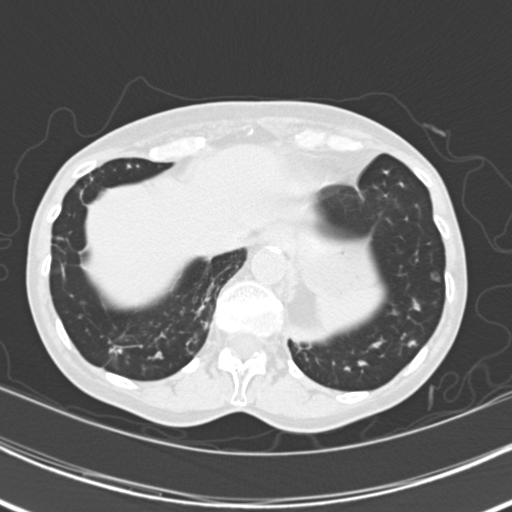
[im 47/158  lung]
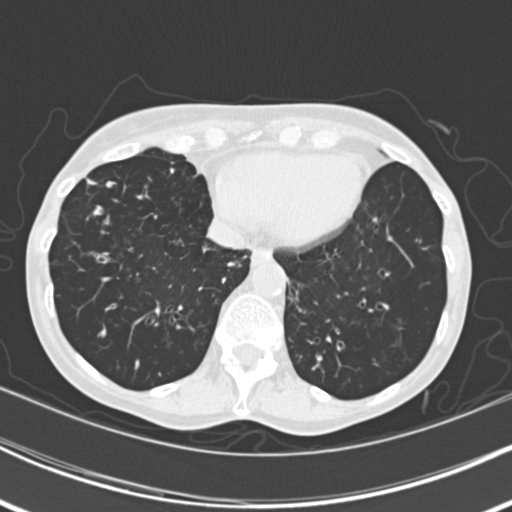
[im 59/158  mediastinal]
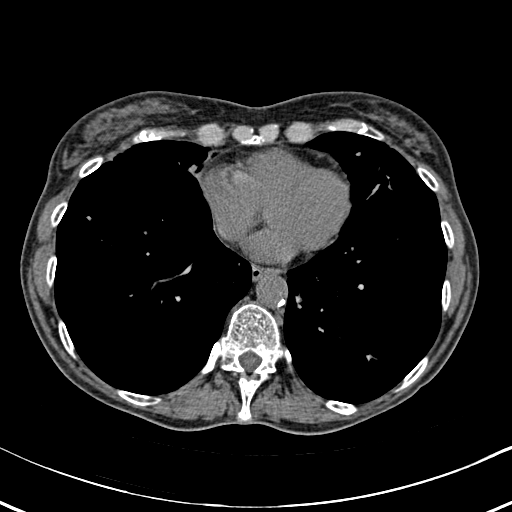
[im 59/158  lung]
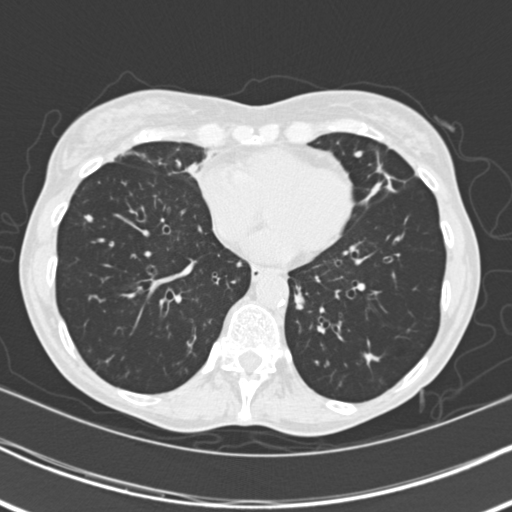
[im 70/158  lung]
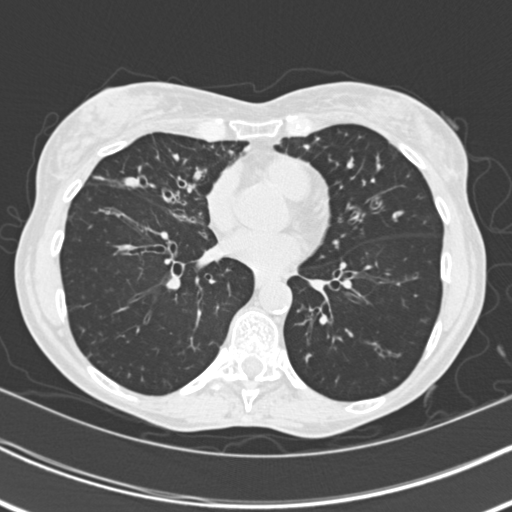
[im 88/158  lung]
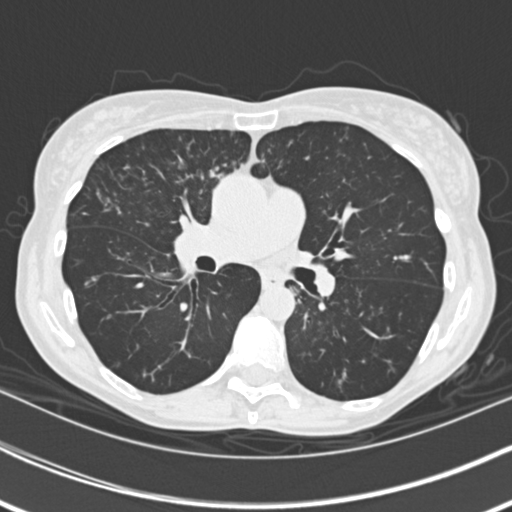
[im 99/158  lung]
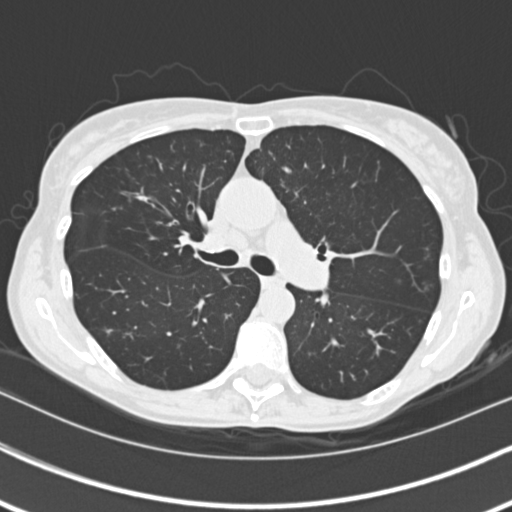
[im 111/158  mediastinal]
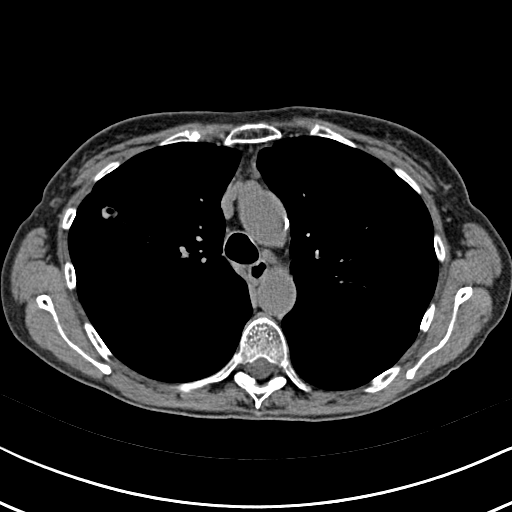
[im 111/158  lung]
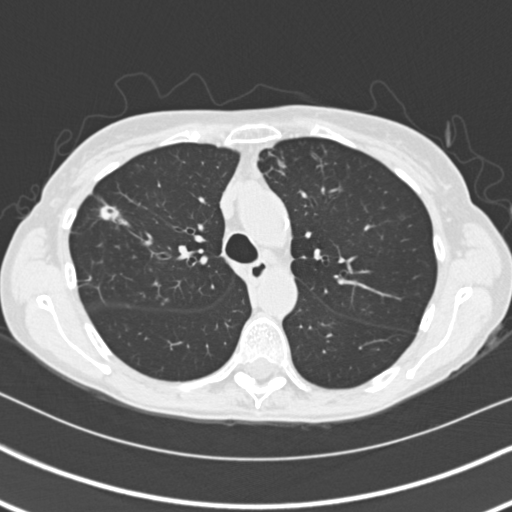
[im 123/158  lung]
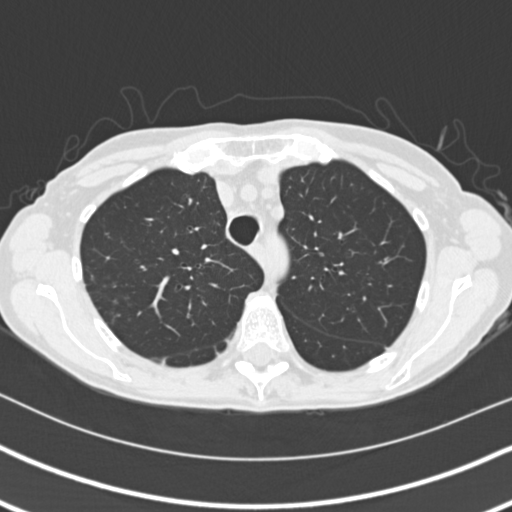
[im 134/158  lung]
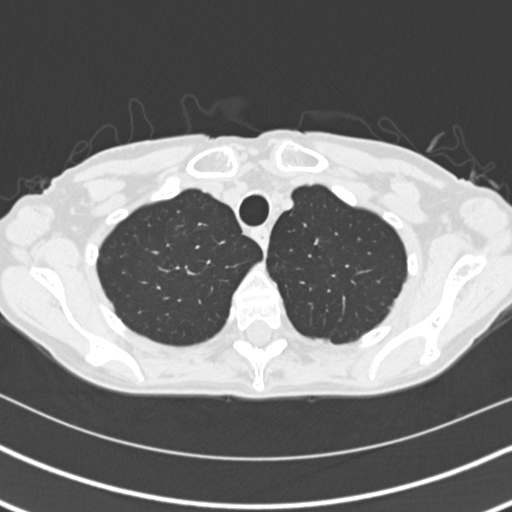
[im 146/158  lung]
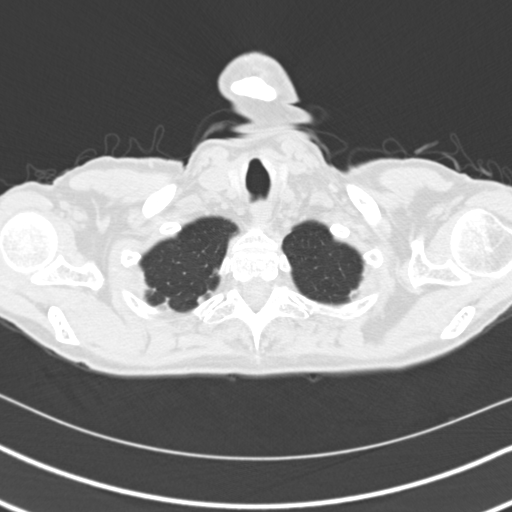

[Series 3: mpr coro 3mm · coronal · 0.59mm/px · 3 of 112 slices shown]
[im 23/112  lung]
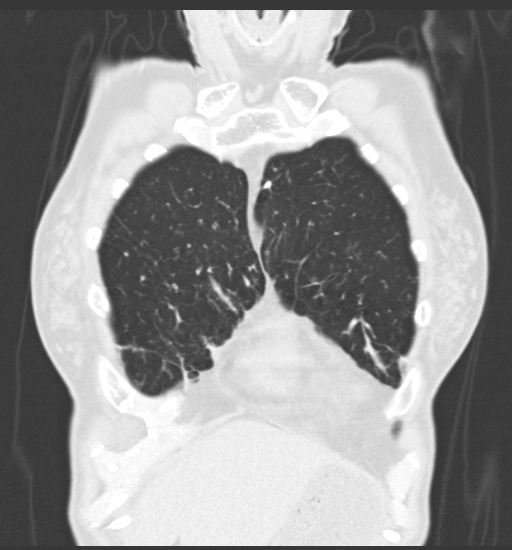
[im 45/112  lung]
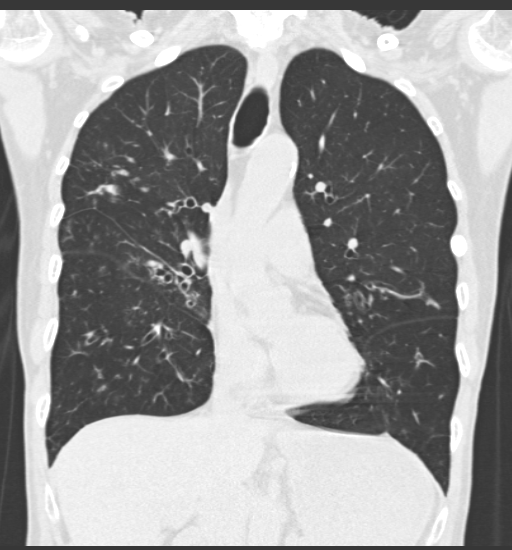
[im 67/112  lung]
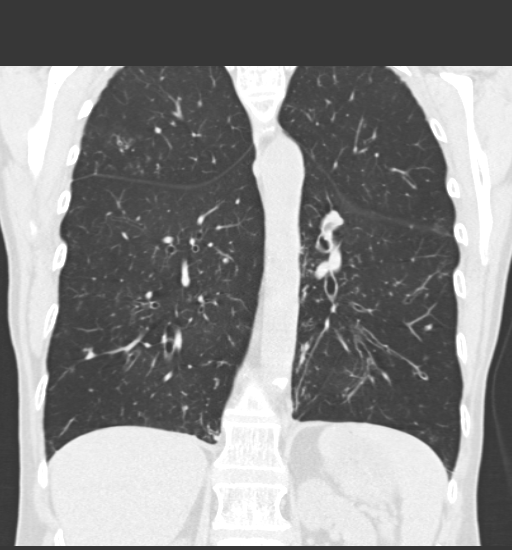

[15 of 36 positions shown; findings below may reference images not displayed]

FINDINGS: Mediastinum / Lymph Nodes: There is no axillary lymphadenopathy. 9
mm short axis precarinal lymph node is unchanged. 6 mm short axis
subcarinal lymph node is stable. No evidence for gross hilar
lymphadenopathy although assessment is limited by the lack of
intravenous contrast on today's study.Ascending thoracic aorta now
measures 3.4 cm in diameter compared to 3.2 cm previously. The heart
size is normal. No pericardial effusion. Coronary artery
calcification is noted. The esophagus has normal imaging features.

Lungs / Pleura: As before, there is prominent bronchiectasis in the
right middle lobe, lingula, and both lower lobes. This is associated
with bronchial wall thickening and numerous areas of prominent
airway impaction, mainly involving the right middle and lower lobes
with some impacted airways noted in the posterior left lower lobe.
There is associated micro nodularity in the areas of bronchiectasis,
mainly following a peribronchovascular distribution with some
fluctuation in the appearance of the nodules between this study and
the prior. Patient does have a dominant, spiculated 8 x 13 mm
cavitary nodule in the right upper lobe (image 47 series 5). This
nodule was present previously but has progressed. Another fairly
dominant 5 x 9 mm spiculated nodule in the posterior right middle
lobe (image 54 series 5) has also progressed.

Upper Abdomen: Visualized portions of the liver and spleen have
normal uninfused appearance. No evidence for adrenal nodule or mass.
Atherosclerotic calcification is noted in the abdominal aorta.

[HOSPITAL] / Soft Tissues: Bone windows reveal no worrisome lytic or
sclerotic osseous lesions.
IMPRESSION: 1. As before, there is diffuse bronchiectasis in the mid and lower
lungs with peribronchial nodularity, bronchial wall thickening, and
airway impaction. Imaging features are most suggestive of atypical
infection. The patient does have some nodules which have become more
dominant, 1 of which shows cavitation. As such, closer interval
follow-up is recommended to ensure that these remain stable.
Consider repeat CT chest in 3-6 months.
2. Stable upper normal lymph nodes in the mediastinum.
3. Coronary artery and abdominal aortic atherosclerosis.

## 2016-08-30 ENCOUNTER — Ambulatory Visit (HOSPITAL_COMMUNITY)
Admission: RE | Admit: 2016-08-30 | Discharge: 2016-08-30 | Disposition: A | Discharge status: Home / Self Care | Payer: Medicare Other | Source: Ambulatory Visit | Attending: Emergency Medicine | Admitting: Emergency Medicine

## 2016-08-30 DIAGNOSIS — R05 Cough: Secondary | ICD-10-CM

## 2016-08-30 DIAGNOSIS — J449 Chronic obstructive pulmonary disease, unspecified: Secondary | ICD-10-CM | POA: Insufficient documentation

## 2016-08-30 DIAGNOSIS — J479 Bronchiectasis, uncomplicated: Secondary | ICD-10-CM

## 2016-08-30 DIAGNOSIS — I700 Atherosclerosis of aorta: Secondary | ICD-10-CM | POA: Insufficient documentation

## 2016-08-30 DIAGNOSIS — R918 Other nonspecific abnormal finding of lung field: Secondary | ICD-10-CM | POA: Insufficient documentation

## 2016-08-30 DIAGNOSIS — J209 Acute bronchitis, unspecified: Secondary | ICD-10-CM | POA: Diagnosis not present

## 2016-08-30 DIAGNOSIS — I517 Cardiomegaly: Secondary | ICD-10-CM | POA: Diagnosis not present

## 2016-08-30 DIAGNOSIS — R059 Cough, unspecified: Secondary | ICD-10-CM

## 2016-09-04 ENCOUNTER — Ambulatory Visit (INDEPENDENT_AMBULATORY_CARE_PROVIDER_SITE_OTHER): Payer: Medicare Other | Admitting: Emergency Medicine

## 2016-09-04 ENCOUNTER — Encounter: Payer: Self-pay | Admitting: Emergency Medicine

## 2016-09-04 DIAGNOSIS — J479 Bronchiectasis, uncomplicated: Secondary | ICD-10-CM | POA: Diagnosis not present

## 2016-09-04 NOTE — Patient Instructions (Addendum)
Please continue your allegra, flonase.  Please restart mucinex 600mg  twice a day Use albuterol as needed for shortness of breath  We will perform sputum cultures now Take levaquin 500mg  daily for 10 days.  Follow with Dr Delton CoombesByrum next available.

## 2016-09-04 NOTE — Assessment & Plan Note (Signed)
Based on the changes in her CT scan, her persistent purulent mucous production, chronic cough I suspect that this is Mycobacterium avium with smoldering/active disease. Need to establish with cultures and sensitivities before starting long-term antibiotics. She's not enthusiastic about doing this but understands that symptoms will persist, progress if we do not. If she is unable to produce a sputum sample we will arrange bronchoscopy. I will treat her for possible acute flare after the sputum samples of been obtained. Add Mucinex. I discussed a flutter valve with her but she does not feel that is effective. Continue her allergy regimen. Some of her chronic cough is related to chronic sinus drainage

## 2016-09-04 NOTE — Progress Notes (Signed)
Patient ID: YUVAL NOLET, female    DOB: 1945-08-28, 71 y.o.   MRN: [id number] HPI Ms. Lundblad is a 71 year old woman whom I have followed for chronic cough. 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 Evaluated 2011 by Dr Maple Hudson for allergies - shows multiple sensitivities that are present year round.    ROV 09/09/14 -- follow-up visit for bronchiectasis and chronic cough that is likely multifactorial. 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 On 07/22/14 she underwent a barium swallow which I have reviewed today. It showed no change in her lower cervical lateral esophageal diverticulum. Some residual barium remained in that diverticulum. She has a small hiatal hernia and some associated GERD. She has seen Dr Jenne Pane, tried increased dose prilosec and she saw some improvement. She is currently off. She coughs in the am, clears mucous. Then she has more cough around lunch. She is on anti- histamine and a nasal steroid  ROV 12/02/14 -- follow up visit for chronic cough, bronchiectasis, GERD and hiatal hernia.   Last visit we started a trial of Symbicort, to see if she would benefit. Her cough is better, but unclear whether this is because she was improving already. Not sure we can ascribe to the symbicort. She is on Actuary. No change in exertional tolerance. Has intermittent nasal congestion.   ROV 05/05/15 -- history of bronchiectasis, chronic cough, hiatal hernia and GERD. She also has Zenker's diverticulum. She was recently treated after Xmas and then again in early January, more sinus drainage, dyspnea, wheeze.  Much more purulent sputum. Was treated with azithro and then clarithro. Returned to baseline. She is not going to take allergy shots anymore, she uses nasal steroid prn, on allegra qd.  Restarted omeprazole while she was flaring, but not currently using.   ROV 08/04/15 -- follow-up  visit for history chronic cough, allergic rhinitis, GERD with admitted complications of a hiatal hernia and a Zenker's diverticulum. She also has associated bronchiectasis. Last time we stopped her allergy shots, she seems to have tolerated. She is still having every day cough, clear in the am, colored sputum in the pm. No longer on rotating abx, these had decreased efficacy and she developed an allergy. She is on allergy regimen.  No GERD meds right now.   ROV 11/10/15 -- Patient has a history of allergic rhinitis, GERD, chronic cough. Also with associated bronchiectasis And history of Mycobacterium avium, MRSA colonization. We have repeated her CT scan of the chest on 08/16/15 and have reviewed the results. It shows continued diffuse bronchiectasis with peribronchial nodularity and bronchial wall thickening, at least one nodule that shows some evidence of mild cavitation. We checked an AFB culture 08/04/15 that was negative.       ROV 04/24/16 --  this is a follow-up visit for complicated chronic cough. 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  She continues to have cough. May be worst in the am, sometimes needs to clear plugs.  She is having sinus congestion; she goes on / off a nasal steroid, currently off.  Seems to benefit from albuterol, using a few times a  Day.           ROV 09/04/16 -- Ms. Schroader has a history of bronchiectasis, M. avium and MRSA colonization,  chronic cough due to this and also with contributions from allergic rhinitis, GERD, possibly a Zenker's diverticulum.    We repeated a CT scan of the chest on 08/30/16 that I have personally reviewed.  This shows continued diffuse areas of bronchiectasis with some mucoid impaction and some persistent isolated nodularity. There is some increased cavitary change noted with at least  2 of these nodules on the right. Her original bronchoscopy was 04/08/07 ([facility] AFB positive on sputum from 05/17/11, negative on sputum from 08/04/15. She is coughing frequently - productive of greenish phlegm. Uses albuterol a few times a week.     Vitals:   09/04/16 1331  BP: 102/60  Pulse: 79  SpO2: 99%  Weight: 101 lb 3.2 oz (45.9 kg)  Height: 5\' 4"  (1.626 m)   Gen: Pleasant, Thin woman, in no distress,  normal affect  ENT: No lesions,  mouth clear,  oropharynx clear, no postnasal drip  Neck: No JVD, no stridor  Lungs: No use of accessory muscles, Few inspiratory and expiratory squeaks more so on the right  Cardiovascular: RRR, heart sounds normal, no murmur or gallops, no peripheral edema  Musculoskeletal: No deformities, no cyanosis or clubbing  Neuro: alert, non focal  Skin: Warm, no lesions or rashes  08/30/16 COMPARISON:  08/16/2015  FINDINGS: Cardiovascular: Aortic atherosclerosis. Mild cardiomegaly, without pericardial effusion.  Mediastinum/Nodes: Similar precarinal node at 9 mm on image 61/series 2. Hilar regions poorly evaluated without intravenous contrast.  Lungs/Pleura: No pleural fluid.  Again identified is a relatively diffuse but slightly lower lobe predominant pattern of cylindrical bronchiectasis, mucoid impaction, and clustered nodularity. Areas of progression are identified, including within the posterior left upper lobe on image 62/ series 4 and periphery of the left lower lobe.  Larger pulmonary nodules are again identified. Example in the right upper lobe at 10 x 8 mm on image 49/ series 4. Compare 13 x 8 mm on the prior, decreased. Just inferior to this in the right upper lobe, nodularity measures maximally 8 mm today on image 56/ series 4 versus 9 mm on the prior. New cavitation within. Increased mucoid impaction in the right upper lobe including on image 45/series 4.  Upper Abdomen: Normal imaged portions of the liver,  spleen, stomach, pancreas, gallbladder, adrenal glands, kidneys.  Musculoskeletal: No acute osseous abnormality.  IMPRESSION: 1. Slight progression of constellation of findings, including bronchial wall thickening, mucoid impaction, clustered nodularity. 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 2.  Aortic atherosclerosis.   BRONCHIECTASIS Based on the changes in her CT scan, her persistent purulent mucous production, chronic cough I suspect that this is Mycobacterium avium with smoldering/active disease. Need to establish with cultures and sensitivities before starting long-term antibiotics. She's not enthusiastic about doing this but understands that symptoms will persist, progress if we do not. If she is unable to produce a sputum sample we will arrange bronchoscopy. I will treat her for possible acute flare after the sputum samples of been obtained. Add Mucinex. I discussed a flutter valve with her but she does not feel that is effective. Continue her allergy regimen. Some of her chronic cough is related to chronic sinus drainage    Levy Pupaobert Serra Younan, MD, PhD 09/04/2016, 1:59 PM [place] [facility] [phone] or if no answer [phone]

## 2016-09-05 NOTE — Addendum Note (Signed)
Addended by: Garfield CorneaMABRY, JASMINE L on: 09/05/2016 12:18 PM   Modules accepted: Orders

## 2016-09-06 ENCOUNTER — Telehealth: Payer: Self-pay | Admitting: Emergency Medicine

## 2016-09-06 ENCOUNTER — Other Ambulatory Visit: Payer: Medicare Other

## 2016-09-06 DIAGNOSIS — J479 Bronchiectasis, uncomplicated: Secondary | ICD-10-CM | POA: Diagnosis not present

## 2016-09-06 MED ORDER — LEVOFLOXACIN 500 MG PO TABS: 500.0000 mg | ORAL_TABLET | Freq: Every day | ORAL | 0 refills | Status: DC

## 2016-09-06 NOTE — Telephone Encounter (Signed)
Please continue your allegra, flonase.  Please restart mucinex 600mg  twice a day Use albuterol as needed for shortness of breath  We will perform sputum cultures now Take levaquin 500mg  daily for 10 days.  Follow with Dr Delton CoombesByrum next available.     Left detailed message stating Levaquin 500mg  [facility] her since it was supposed have to be called in on Monday.

## 2016-09-20 ENCOUNTER — Other Ambulatory Visit: Payer: Self-pay | Admitting: Family Medicine

## 2016-09-28 ENCOUNTER — Ambulatory Visit (INDEPENDENT_AMBULATORY_CARE_PROVIDER_SITE_OTHER): Payer: Medicare Other | Admitting: Emergency Medicine

## 2016-09-28 ENCOUNTER — Encounter: Payer: Self-pay | Admitting: Emergency Medicine

## 2016-09-28 ENCOUNTER — Other Ambulatory Visit (INDEPENDENT_AMBULATORY_CARE_PROVIDER_SITE_OTHER): Payer: Medicare Other

## 2016-09-28 VITALS — BP 118/60 | HR 84

## 2016-09-28 DIAGNOSIS — A310 Pulmonary mycobacterial infection: Secondary | ICD-10-CM

## 2016-09-28 DIAGNOSIS — J479 Bronchiectasis, uncomplicated: Secondary | ICD-10-CM

## 2016-09-28 DIAGNOSIS — R5383 Other fatigue: Secondary | ICD-10-CM | POA: Diagnosis not present

## 2016-09-28 LAB — CBC WITH DIFFERENTIAL/PLATELET
Basophils Absolute: 0 10*3/uL (ref 0.0–0.1)
Basophils Relative: 0.4 % (ref 0.0–3.0)
Eosinophils Absolute: 0.1 10*3/uL (ref 0.0–0.7)
Eosinophils Relative: 0.8 % (ref 0.0–5.0)
HCT: 44.1 % (ref 36.0–46.0)
Hemoglobin: 14.8 g/dL (ref 12.0–15.0)
LYMPHS ABS: 1.5 10*3/uL (ref 0.7–4.0)
Lymphocytes Relative: 14.5 % (ref 12.0–46.0)
MCHC: 33.5 g/dL (ref 30.0–36.0)
MCV: 89.8 fl (ref 78.0–100.0)
MONO ABS: 0.7 10*3/uL (ref 0.1–1.0)
MONOS PCT: 6.8 % (ref 3.0–12.0)
NEUTROS ABS: 7.8 10*3/uL — AB (ref 1.4–7.7)
NEUTROS PCT: 77.5 % — AB (ref 43.0–77.0)
PLATELETS: 250 10*3/uL (ref 150.0–400.0)
RBC: 4.91 Mil/uL (ref 3.87–5.11)
RDW: 12.9 % (ref 11.5–15.5)
WBC: 10 10*3/uL (ref 4.0–10.5)

## 2016-09-28 MED ORDER — SODIUM CHLORIDE 3 % IN NEBU: INHALATION_SOLUTION | Freq: Two times a day (BID) | RESPIRATORY_TRACT | 5 refills | Status: DC

## 2016-09-28 MED ORDER — ALBUTEROL SULFATE (2.5 MG/3ML) 0.083% IN NEBU: 2.5000 mg | INHALATION_SOLUTION | Freq: Four times a day (QID) | RESPIRATORY_TRACT | 5 refills | Status: DC | PRN

## 2016-09-28 NOTE — Assessment & Plan Note (Signed)
Suspect that her inflammatory changes due to mycobacterial disease. She has also been colonized with MRSA and we may need to consider treatment for this depending on our culture data

## 2016-09-28 NOTE — Progress Notes (Signed)
Patient ID: Mary [name], female    DOB: [birth year], 71 y.o.   MRN: [id number] HPI Mary [name] is a 71 year old woman whom I have followed for chronic cough. 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 Evaluated 2011 by Dr Annamaria Boots for allergies - shows multiple sensitivities that are present year round.       ROV 04/24/16 --  this is a follow-up visit for complicated chronic cough. 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  She continues to have cough. May be worst in the am, sometimes needs to clear plugs.  She is having sinus congestion; she goes on / off a nasal steroid, currently off.  Seems to benefit from albuterol, using a few times a  Day.           ROV 09/04/16 -- Mary [name] has a history of bronchiectasis, M. 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    We repeated a CT scan of the chest on 08/30/16 that I have personally reviewed.  This shows continued diffuse areas of bronchiectasis with some mucoid impaction and some persistent isolated nodularity. There is some increased cavitary change noted with at least 2 of these nodules on the right. Her original bronchoscopy was 04/08/07 (negative for [facility]), AFB positive on sputum from 05/17/11, negative on sputum from 08/04/15. She is coughing frequently - productive of greenish phlegm. Uses albuterol a few times a week.   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 Her CT scan from last month showed progressive mucoid impaction with  some persistent isolated nodularity, increased cavitary changes in some of the nodules. Her culture for [facility] has been positive in 2013 (08/04/15). Rechecked AFB on 09/06/16, smear negative, culture pending. I treated her with Levaquin for 10 days in the event that this was a typical bacterium with bronchiectasis flare. We also restarted her Mucinex, continued Allegra and Flonase. She reports today that she took the levaquin. Sx are minimally changed, still coughs up thick mucous.     Vitals:   09/28/16 1634  BP: 118/60  Pulse: 84  SpO2: 98%   Gen: Pleasant, Thin woman, in no distress,  normal affect  ENT: No lesions,  mouth clear,  oropharynx clear, no postnasal drip  Neck: No JVD, no stridor  Lungs: No use of accessory muscles, Few inspiratory and expiratory squeaks more so on the right  Cardiovascular: RRR, heart sounds normal, no murmur or gallops, no peripheral edema  Musculoskeletal: No deformities, no cyanosis or clubbing  Neuro: alert, non focal  Skin: Warm, no lesions or rashes  08/30/16 COMPARISON:  08/16/2015  FINDINGS: Cardiovascular: Aortic atherosclerosis. Mild cardiomegaly, without pericardial effusion.  Mediastinum/Nodes: Similar precarinal node at 9 mm on image 61/series 2. Hilar regions poorly evaluated without intravenous contrast.  Lungs/Pleura: No pleural fluid.  Again identified is a relatively diffuse but slightly lower lobe predominant pattern of cylindrical bronchiectasis, mucoid impaction, and clustered nodularity. Areas of progression are identified, including within the posterior left upper lobe on image 62/ series 4 and periphery of the left lower lobe.  Larger pulmonary nodules are again  identified. Example in the right upper lobe at 10 x 8 mm on image 49/ series 4. Compare 13 x 8 mm on the prior, decreased. Just inferior to this in the right upper lobe, nodularity measures maximally 8 mm today on image 56/ series 4 versus 9 mm on the  prior. New cavitation within. Increased mucoid impaction in the right upper lobe including on image 45/series 4.  Upper Abdomen: Normal imaged portions of the liver, spleen, stomach, pancreas, gallbladder, adrenal glands, kidneys.  Musculoskeletal: No acute osseous abnormality.  IMPRESSION: 1. Slight progression of constellation of findings, including bronchial wall thickening, mucoid impaction, clustered nodularity. 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 2.  Aortic atherosclerosis.   Fatigue Suspect that this relates to chronic inflammation/infection with her bronchiectasis. Must also consider other possible etiologies. We are following her sputum culture and will treat if appropriate. Check CBC, TSH, BMP, cortisol today.  Pulmonary Mycobacterium avium complex (MAC) infection She had been treated for Mycobacterium avium before, now with evidence for recurrence. Her cultures are still pending. We will arrange for bronchoscopy in mid July. If her cultures come back before that we will cancel. Discussed with her long-term treatment. She is not enthusiastic about doing this but or stands it may be necessary.  BRONCHIECTASIS Suspect that her inflammatory changes due to mycobacterial disease. She has also been colonized with MRSA and we may need to consider treatment for this depending on our culture data    Baltazar Apo, MD, PhD 09/28/2016, 5:10 PM [facility] [phone] or if no answer [phone]

## 2016-09-28 NOTE — Patient Instructions (Addendum)
We will start nebulized hypertonic saline twice a day to help with secretion clearance We will start nebulized albuterol to open airways. This may be used up to every 6 hours as needed, but start out by trying to use it 1 -2 times a day.  We will arrange for a bronchoscopy and cultures in the coming weeks. If your sputum culture results come back positive then we will cancel the bronchoscopy.  Lab work today.  Follow with Dr Delton CoombesByrum next available

## 2016-09-28 NOTE — Assessment & Plan Note (Signed)
She had been treated for Mycobacterium avium before, now with evidence for recurrence. Her cultures are still pending. We will arrange for bronchoscopy in mid July. If her cultures come back before that we will cancel. Discussed with her long-term treatment. She is not enthusiastic about doing this but or stands it may be necessary.

## 2016-09-28 NOTE — Assessment & Plan Note (Signed)
Suspect that this relates to chronic inflammation/infection with her bronchiectasis. Must also consider other possible etiologies. We are following her sputum culture and will treat if appropriate. Check CBC, TSH, BMP, cortisol today.

## 2016-09-29 ENCOUNTER — Telehealth: Payer: Self-pay | Admitting: Emergency Medicine

## 2016-09-29 LAB — BASIC METABOLIC PANEL
BUN: 14 mg/dL (ref 6–23)
CALCIUM: 9.3 mg/dL (ref 8.4–10.5)
CO2: 29 mEq/L (ref 19–32)
CREATININE: 0.73 mg/dL (ref 0.40–1.20)
Chloride: 101 mEq/L (ref 96–112)
GFR: 83.56 mL/min (ref >60.00)
GLUCOSE: 123 mg/dL — AB (ref 70–99)
Potassium: 4.6 mEq/L (ref 3.5–5.1)
Sodium: 140 mEq/L (ref 135–145)

## 2016-09-29 LAB — CORTISOL: Cortisol, Plasma: 9.4 ug/dL

## 2016-09-29 LAB — TSH: TSH: 1.46 u[IU]/mL (ref 0.35–4.50)

## 2016-09-29 NOTE — Telephone Encounter (Signed)
[facility]. [facility]  lmtcb x1 for pt to make her aware.

## 2016-09-29 NOTE — Telephone Encounter (Signed)
Per RB  - [facility] if [facility], no fluro and no TB risk.  Called respiratory department and left a message for Delice Bisonara to call me back.

## 2016-10-02 NOTE — Telephone Encounter (Signed)
lmtcb x2 for pt. 

## 2016-10-02 NOTE — Telephone Encounter (Signed)
lmtcb for pt. Routing back to Lindsay's inbasket for follow-up.

## 2016-10-02 NOTE — Telephone Encounter (Signed)
Pt is aware of scheduled bronch time/day. Pt aware to hold aspirin 3 days prior to bronch. Pt states during her OV on 09/28/16, she was prescribed a neb machine. [facility]. I have spoken with Barbara CowerJason with [facility], who states [facility] left message after 12:30 in regards to collecting co pay and scheduling pick up/shipment.  Pt is aware and voiced her understanding. Pt is also requesting lab results from 09/28/16.  RB please advise on labs. Thanks.

## 2016-10-02 NOTE — Telephone Encounter (Signed)
Patient is returning phone call. Please call mobile [phone].

## 2016-10-02 NOTE — Telephone Encounter (Signed)
Pt returning call to lindsey.Stanley A Dalton ° °

## 2016-10-08 ENCOUNTER — Other Ambulatory Visit: Payer: Self-pay | Admitting: Family Medicine

## 2016-10-09 NOTE — Telephone Encounter (Signed)
Please let her know that all of her lab work was normal

## 2016-10-10 NOTE — Telephone Encounter (Signed)
Spoke with pt. She is aware of lab results. Nothing further was needed.

## 2016-10-13 ENCOUNTER — Ambulatory Visit (HOSPITAL_COMMUNITY)
Admission: RE | Admit: 2016-10-13 | Discharge: 2016-10-13 | Disposition: A | Discharge status: Home / Self Care | Payer: Medicare Other | Source: Ambulatory Visit | Attending: Emergency Medicine | Admitting: Emergency Medicine

## 2016-10-13 ENCOUNTER — Encounter (HOSPITAL_COMMUNITY): Payer: Self-pay | Admitting: Respiratory Therapy

## 2016-10-13 ENCOUNTER — Encounter (HOSPITAL_COMMUNITY)
Admission: RE | Disposition: A | Discharge status: Home / Self Care | Payer: Self-pay | Source: Ambulatory Visit | Attending: Emergency Medicine

## 2016-10-13 DIAGNOSIS — A310 Pulmonary mycobacterial infection: Secondary | ICD-10-CM | POA: Diagnosis present

## 2016-10-13 DIAGNOSIS — I700 Atherosclerosis of aorta: Secondary | ICD-10-CM | POA: Diagnosis not present

## 2016-10-13 DIAGNOSIS — J309 Allergic rhinitis, unspecified: Secondary | ICD-10-CM | POA: Diagnosis not present

## 2016-10-13 DIAGNOSIS — K219 Gastro-esophageal reflux disease without esophagitis: Secondary | ICD-10-CM | POA: Insufficient documentation

## 2016-10-13 DIAGNOSIS — R059 Cough, unspecified: Secondary | ICD-10-CM

## 2016-10-13 DIAGNOSIS — J479 Bronchiectasis, uncomplicated: Secondary | ICD-10-CM | POA: Diagnosis not present

## 2016-10-13 DIAGNOSIS — R05 Cough: Secondary | ICD-10-CM | POA: Diagnosis not present

## 2016-10-13 DIAGNOSIS — R5383 Other fatigue: Secondary | ICD-10-CM | POA: Diagnosis not present

## 2016-10-13 HISTORY — PX: VIDEO BRONCHOSCOPY: SHX5072

## 2016-10-13 LAB — BODY FLUID CELL COUNT WITH DIFFERENTIAL
LYMPHS FL: 4 %
MONOCYTE-MACROPHAGE-SEROUS FLUID: 10 % — AB (ref 50–90)
NEUTROPHIL FLUID: 86 % — AB (ref 0–25)
Total Nucleated Cell Count, Fluid: 296 cu mm (ref 0–1000)

## 2016-10-13 SURGERY — VIDEO BRONCHOSCOPY WITHOUT FLUORO
Anesthesia: Moderate Sedation | Laterality: Bilateral

## 2016-10-13 MED ORDER — FENTANYL CITRATE (PF) 100 MCG/2ML IJ SOLN
INTRAMUSCULAR | Status: DC | PRN
  Administered 2016-10-13: 50 ug via INTRAVENOUS

## 2016-10-13 MED ORDER — SODIUM CHLORIDE 0.9 % IV SOLN
Freq: Once | INTRAVENOUS | Status: AC
  Administered 2016-10-13: 08:00:00 via INTRAVENOUS

## 2016-10-13 MED ORDER — LIDOCAINE HCL 1 % IJ SOLN
INTRAMUSCULAR | Status: DC | PRN
  Administered 2016-10-13: 6 mL via RESPIRATORY_TRACT

## 2016-10-13 MED ORDER — FENTANYL CITRATE (PF) 100 MCG/2ML IJ SOLN
INTRAMUSCULAR | Status: AC
  Filled 2016-10-13: qty 4

## 2016-10-13 MED ORDER — MIDAZOLAM HCL 10 MG/2ML IJ SOLN
INTRAMUSCULAR | Status: DC | PRN
  Administered 2016-10-13: 3 mg via INTRAVENOUS

## 2016-10-13 MED ORDER — MIDAZOLAM HCL 5 MG/ML IJ SOLN
INTRAMUSCULAR | Status: AC
  Filled 2016-10-13: qty 2

## 2016-10-13 MED ORDER — PHENYLEPHRINE HCL 0.25 % NA SOLN
NASAL | Status: DC | PRN
  Administered 2016-10-13: 2 via NASAL

## 2016-10-13 MED ORDER — PHENYLEPHRINE HCL 0.25 % NA SOLN: 1.0000 | Freq: Four times a day (QID) | NASAL | Status: DC | PRN

## 2016-10-13 MED ORDER — LIDOCAINE HCL 2 % EX GEL: 1.0000 "application " | Freq: Once | CUTANEOUS | Status: DC

## 2016-10-13 MED ORDER — SODIUM CHLORIDE 3 % IN NEBU: 4.0000 mL | INHALATION_SOLUTION | Freq: Two times a day (BID) | RESPIRATORY_TRACT | Status: DC

## 2016-10-13 MED ORDER — LIDOCAINE HCL 2 % EX GEL
CUTANEOUS | Status: DC | PRN
  Administered 2016-10-13: 1

## 2016-10-13 NOTE — Op Note (Signed)
[facility] Patient Name: Mary [name] Procedure Date: 10/13/2016 MRN: [id number] Attending MD: Collene Gobble , MD Date of Birth: 11/02/1945 CSN: [id number] Age: 71 Admit Type: Outpatient Ethnicity: Not Hispanic or Latino Procedure:            Bronchoscopy Indications:          Chronic cough with abnormal CT, Bronchiectasis Providers:            Collene Gobble, MD, Ashley Mariner RRT,RCP, Phillis Knack                        RRT, RCP Referring MD:          Medicines:            Midazolam 3 mg mg IV, Fentanyl 50 mcg IV, Lidocaine 1%                        applied to cords 12 mL, Lidocaine 1% applied to the                        tracheobronchial tree 8 mL, Oxygen 10 L/min Complications:        No immediate complications Estimated Blood Loss: Estimated blood loss: none. Procedure:      Pre-Anesthesia Assessment:      - A History and Physical has been performed. Patient meds and allergies       have been reviewed. The risks and benefits of the procedure and the       sedation options and risks were discussed with the patient. All       questions were answered and informed consent was obtained. Patient       identification and proposed procedure were verified prior to the       procedure by the physician in the procedure room. Mental Status       Examination: alert and oriented. Airway Examination: normal       oropharyngeal airway. Respiratory Examination: rhonchi. CV Examination:       RRR, no murmurs, no S3 or S4. ASA Grade Assessment: II - A patient with       mild systemic disease. After reviewing the risks and benefits, the       patient was deemed in satisfactory condition to undergo the procedure.       The anesthesia plan was to use moderate sedation / analgesia (conscious       sedation). Immediately prior to administration of medications, the       patient was re-assessed for adequacy to receive sedatives. The heart       rate, respiratory  rate, oxygen saturations, blood pressure, adequacy of       pulmonary ventilation, and response to care were monitored throughout       the procedure. The physical status of the patient was re-assessed after       the procedure.      After obtaining informed consent, the bronchoscope was passed under       direct vision. Throughout the procedure, the patient's blood pressure,       pulse, and oxygen saturations were monitored continuously. the [id number]       ([id number]) scope was introduced through the left nostril and advanced to       the tracheobronchial tree. The procedure was accomplished without       difficulty. The patient tolerated the  procedure well. Findings:      The nasopharynx/oropharynx appears normal. The larynx appears normal.       The vocal cords appear normal with prominent false cords. The subglottic       space is normal. The trachea is of normal caliber. The carina is sharp.       The tracheobronchial tree was examined to at least the first       subsegmental level. Bronchial mucosa and anatomy are normal; there are       no endobronchial lesions. There were yellow secretions that were coughed       from the RUL, RML, lingular and LLL airways, all easily suctioned.      Bronchoalveolar lavage was performed in the RML medial segment (B5) of       the lung and sent for cell count, bacterial culture, viral smears &       culture, and fungal & AFB analysis. 60 mL of fluid were instilled. 14 mL       were returned. The return was cloudy and mucopurulent. There were no       mucoid plugs in the return fluid.      Suctioning was performed to clear secretions from RUL, RML, lingula, LLL       and the airways were cleared. The suctioned material was sent for AFB       analysis & culture. Impression:      - Chronic cough with abnormal CT      - Bronchiectasis      - The examination was normal with the exception of yellow secretions       suctioned from multiple airways.       - Bronchoalveolar lavage was performed RML. Sent for cell count and       micro.      - Suctioning was performed. Sent for AFB Moderate Sedation:      Moderate (conscious) sedation was personally administered by the       endoscopist. The following parameters were monitored: oxygen saturation,       heart rate, blood pressure, respiratory rate, EKG, adequacy of pulmonary       ventilation, and response to care. Total physician intraservice time was       17 minutes. Recommendation:      - Await BAL and culture results. Procedure Code(s):      --- Professional ---      [phone], Bronchoscopy, rigid or flexible, including fluoroscopic guidance,       when performed; with bronchial alveolar lavage      99152, Moderate sedation services provided by the same physician or       other qualified health care professional performing the diagnostic or       therapeutic service that the sedation supports, requiring the presence       of an independent trained observer to assist in the monitoring of the       patient's level of consciousness and physiological status; initial 15       minutes of intraservice time, patient age 76 years or older Diagnosis Code(s):      --- Professional ---      R05, Cough      R91.8, Other nonspecific abnormal finding of lung field      J47.9, Bronchiectasis, uncomplicated CPT copyright 2016 American Medical Association. All rights reserved. The codes documented in this report are preliminary and upon coder review may  be revised to meet current  compliance requirements. Collene Gobble, MD Collene Gobble, MD 10/13/2016 8:43:43 AM Number of Addenda: 0 Scope In: 8:11:36 AM Scope Out: 8:22:10 AM

## 2016-10-13 NOTE — Progress Notes (Signed)
Video bronchoscopy performed.  Intervention sputum sample. Intervention bronchial washing.  No complications noted.  Will continue to monitor.

## 2016-10-13 NOTE — H&P (View-Only) (Signed)
Patient ID: Mary [name], female    DOB: 05-19-1945, 71 y.o.   MRN: [id number] HPI Mary [name] is a 71 year old woman whom I have followed for chronic cough. 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 Evaluated 2011 by Mary [name] for allergies - shows multiple sensitivities that are present year round.       ROV 04/24/16 --  this is a follow-up visit for complicated chronic cough. 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  She continues to have cough. May be worst in the am, sometimes needs to clear plugs.  She is having sinus congestion; she goes on / off a nasal steroid, currently off.  Seems to benefit from albuterol, using a few times a  Day.           ROV 09/04/16 -- Mary [name] has a history of bronchiectasis, M. 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    We repeated a CT scan of the chest on 08/30/16 that I have personally reviewed.  This shows continued diffuse areas of bronchiectasis with some mucoid impaction and some persistent isolated nodularity. There is some increased cavitary change noted with at least 2 of these nodules on the right. Her original bronchoscopy was 04/08/07 (negative for [facility]), AFB positive on sputum from 05/17/11, negative on sputum from 08/04/15. She is coughing frequently - productive of greenish phlegm. Uses albuterol a few times a week.   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 Her CT scan from last month showed progressive mucoid impaction with  some persistent isolated nodularity, increased cavitary changes in some of the nodules. Her culture for [facility] has been positive in 2013 (08/04/15). Rechecked AFB on 09/06/16, smear negative, culture pending. I treated her with Levaquin for 10 days in the event that this was a typical bacterium with bronchiectasis flare. We also restarted her Mucinex, continued Allegra and Flonase. She reports today that she took the levaquin. Sx are minimally changed, still coughs up thick mucous.     Vitals:   09/28/16 1634  BP: 118/60  Pulse: 84  SpO2: 98%   Gen: Pleasant, Thin woman, in no distress,  normal affect  ENT: No lesions,  mouth clear,  oropharynx clear, no postnasal drip  Neck: No JVD, no stridor  Lungs: No use of accessory muscles, Few inspiratory and expiratory squeaks more so on the right  Cardiovascular: RRR, heart sounds normal, no murmur or gallops, no peripheral edema  Musculoskeletal: No deformities, no cyanosis or clubbing  Neuro: alert, non focal  Skin: Warm, no lesions or rashes  08/30/16 COMPARISON:  08/16/2015  FINDINGS: Cardiovascular: Aortic atherosclerosis. Mild cardiomegaly, without pericardial effusion.  Mediastinum/Nodes: Similar precarinal node at 9 mm on image 61/series 2. Hilar regions poorly evaluated without intravenous contrast.  Lungs/Pleura: No pleural fluid.  Again identified is a relatively diffuse but slightly lower lobe predominant pattern of cylindrical bronchiectasis, mucoid impaction, and clustered nodularity. Areas of progression are identified, including within the posterior left upper lobe on image 62/ series 4 and periphery of the left lower lobe.  Larger pulmonary nodules are again  identified. Example in the right upper lobe at 10 x 8 mm on image 49/ series 4. Compare 13 x 8 mm on the prior, decreased. Just inferior to this in the right upper lobe, nodularity measures maximally 8 mm today on image 56/ series 4 versus 9 mm on the  prior. New cavitation within. Increased mucoid impaction in the right upper lobe including on image 45/series 4.  Upper Abdomen: Normal imaged portions of the liver, spleen, stomach, pancreas, gallbladder, adrenal glands, kidneys.  Musculoskeletal: No acute osseous abnormality.  IMPRESSION: 1. Slight progression of constellation of findings, including bronchial wall thickening, mucoid impaction, clustered nodularity. 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 2.  Aortic atherosclerosis.   Fatigue Suspect that this relates to chronic inflammation/infection with her bronchiectasis. Must also consider other possible etiologies. We are following her sputum culture and will treat if appropriate. Check CBC, TSH, BMP, cortisol today.  Pulmonary Mycobacterium avium complex (MAC) infection She had been treated for Mycobacterium avium before, now with evidence for recurrence. Her cultures are still pending. We will arrange for bronchoscopy in mid July. If her cultures come back before that we will cancel. Discussed with her long-term treatment. She is not enthusiastic about doing this but or stands it may be necessary.  BRONCHIECTASIS Suspect that her inflammatory changes due to mycobacterial disease. She has also been colonized with MRSA and we may need to consider treatment for this depending on our culture data    Baltazar Apo, MD, PhD 09/28/2016, 5:10 PM [facility] [phone] or if no answer [phone]

## 2016-10-13 NOTE — Interval H&P Note (Signed)
PCCM Interval Note  Pt presents today for further eval of chronic cough in setting bronchiectasis. Also w a Zenker's Diverticulum. Sputum cx from last month negative so far. She presents for bronchoscopy and BAL, airway inspection. No new issues, no barriers to proceeding. She understands procedure, risks, benefits and elcts to proceed.   Baltazar Apo, MD, PhD 10/13/2016, 8:08 AM [facility] [phone] or if no answer [phone]

## 2016-10-13 NOTE — Discharge Instructions (Signed)
Flexible Bronchoscopy, Care After These instructions give you information on caring for yourself after your procedure. Your doctor may also give you more specific instructions. Call your doctor if you have any problems or questions after your procedure. Follow these instructions at home:  Do not eat or drink anything for 2 hours after your procedure. If you try to eat or drink before the medicine wears off, food or drink could go into your lungs. You could also burn yourself.  After 2 hours have passed and when you can cough and gag normally, you may eat soft food and drink liquids slowly.  The day after the test, you may eat your normal diet.  You may do your normal activities.  Keep all doctor visits. Get help right away if:  You get more and more short of breath.  You get light-headed.  You feel like you are going to pass out (faint).  You have chest pain.  You have new problems that worry you.  You cough up more than a little blood.  You cough up more blood than before. This information is not intended to replace advice given to you by your health care provider. Make sure you discuss any questions you have with your health care provider. Document Released: 01/15/2009 Document Revised: 08/26/2015 Document Reviewed: 11/22/2012 Elsevier Interactive Patient Education  2017 [place] not eat or drink until 10:30 am on 10/13/2016.  PLEASE CALL OUR OFFICE FOR ANY QUESTIONS OR CONCERNS. [phone].

## 2016-10-14 LAB — PNEUMOCYSTIS JIROVECI SMEAR BY DFA: PNEUMOCYSTIS JIROVECI AG: NEGATIVE

## 2016-10-14 LAB — ACID FAST SMEAR (AFB, MYCOBACTERIA): Acid Fast Smear: NEGATIVE

## 2016-10-14 LAB — ACID FAST SMEAR (AFB): ACID FAST SMEAR - AFSCU2: NEGATIVE

## 2016-10-15 LAB — CULTURE, BAL-QUANTITATIVE: SPECIAL REQUESTS: NORMAL

## 2016-10-15 LAB — CULTURE, BAL-QUANTITATIVE W GRAM STAIN

## 2016-10-16 ENCOUNTER — Encounter (HOSPITAL_COMMUNITY): Payer: Self-pay | Admitting: Emergency Medicine

## 2016-10-24 LAB — AFB CULTURE WITH SMEAR (NOT AT ARMC)

## 2016-10-26 ENCOUNTER — Telehealth: Payer: Self-pay | Admitting: Emergency Medicine

## 2016-10-26 NOTE — Telephone Encounter (Signed)
Received call from Ace at [facility] lab They received a call from [facility] with call report  Pt had bronch done 7.13.18 AFB came back positive for MAC - sample is still out for susceptibility testing  Routing to RB as FYI on call report

## 2016-10-26 NOTE — Telephone Encounter (Signed)
Please call her and lert her know that we got it. We will statr abx when the sensitivities are available.

## 2016-10-26 NOTE — Telephone Encounter (Signed)
lmtcb x1 for pt. 

## 2016-10-27 NOTE — Telephone Encounter (Signed)
Pt returning call and can be reached @ same.Stanley A Dalton ° °

## 2016-10-27 NOTE — Telephone Encounter (Signed)
Spoke with pt, aware of recs.   Routing to RB to advise on abx as sensitivities become available.  Thanks!

## 2016-11-07 NOTE — Telephone Encounter (Signed)
Any work back from lab about sensitivities yet?

## 2016-11-09 LAB — AFB ORGANISM ID BY DNA PROBE
M AVIUM COMPLEX: POSITIVE — AB
M TUBERCULOSIS COMPLEX: NEGATIVE

## 2016-11-09 LAB — ACID FAST CULTURE WITH REFLEXED SENSITIVITIES: ACID FAST CULTURE - AFSCU3: POSITIVE — AB

## 2016-11-15 LAB — MAC SUSCEPTIBILITY BROTH
Amikacin: 4
Ciprofloxacin: 16
Clarithromycin: 2
Ethambutol: 4
Linezolid: 16
Moxifloxacin: 4
Rifampin: 4
Streptomycin: 16

## 2016-11-15 LAB — AFB ORGANISM ID BY DNA PROBE
M avium complex: POSITIVE — AB
M gordonae: NEGATIVE
M kansasii: NEGATIVE
M tuberculosis complex: NEGATIVE

## 2016-11-15 LAB — ACID FAST CULTURE WITH REFLEXED SENSITIVITIES: ACID FAST CULTURE - AFSCU3: POSITIVE — AB

## 2016-12-12 ENCOUNTER — Other Ambulatory Visit: Payer: Self-pay | Admitting: Family Medicine

## 2016-12-13 ENCOUNTER — Ambulatory Visit (INDEPENDENT_AMBULATORY_CARE_PROVIDER_SITE_OTHER): Payer: Medicare Other | Admitting: Emergency Medicine

## 2016-12-13 ENCOUNTER — Encounter: Payer: Self-pay | Admitting: Emergency Medicine

## 2016-12-13 DIAGNOSIS — J301 Allergic rhinitis due to pollen: Secondary | ICD-10-CM

## 2016-12-13 DIAGNOSIS — A310 Pulmonary mycobacterial infection: Secondary | ICD-10-CM | POA: Diagnosis not present

## 2016-12-13 MED ORDER — ETHAMBUTOL HCL 400 MG PO TABS: 400.0000 mg | ORAL_TABLET | Freq: Every day | ORAL | 5 refills | Status: DC

## 2016-12-13 MED ORDER — ETHAMBUTOL HCL 100 MG PO TABS: 300.0000 mg | ORAL_TABLET | Freq: Every day | ORAL | 5 refills | Status: DC

## 2016-12-13 MED ORDER — CLARITHROMYCIN 500 MG PO TABS: 500.0000 mg | ORAL_TABLET | Freq: Two times a day (BID) | ORAL | 5 refills | Status: DC

## 2016-12-13 MED ORDER — RIFAMPIN 150 MG PO CAPS: 450.0000 mg | ORAL_CAPSULE | Freq: Every day | ORAL | 5 refills | Status: DC

## 2016-12-13 NOTE — Assessment & Plan Note (Signed)
Documented recurrence/persistent disease. She will need to be treated with a daily regimen. Based on sensitivities we will start clarithromycin, rifampin, ethambutol.  We [facility] as below: - clarithromycin 500mg  twice a day - rifampin 450mg  daily - ethambutol 700mg  daily Continue to use your hypertonic saline.  Follow with Dr Delton CoombesByrum in 3 months or sooner if you have any problems.

## 2016-12-13 NOTE — Patient Instructions (Addendum)
We will [facility] antibiotics [facility] as below: - clarithromycin 500mg  twice a day - rifampin 450mg  daily - ethambutol 700mg  daily Continue to use your hypertonic saline.  We will try changing your allegra and flonase to alternatives when your supply runs out.  Follow with Dr Delton CoombesByrum in 3 months or sooner if you have any problems.

## 2016-12-13 NOTE — Assessment & Plan Note (Signed)
We will try changing your allegra and flonase to alternatives when your supply runs out.  Nasacort plus loratadine

## 2016-12-13 NOTE — Progress Notes (Signed)
Patient ID: TANGELA DOLLIVER, female    DOB: 1945-10-17, 71 y.o.   MRN: [id number] HPI Ms. Mettler is a 71 year old woman whom I have followed for chronic cough. 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 Evaluated 2011 by Dr Annamaria Boots for allergies - shows multiple sensitivities that are present year round.       ROV 04/24/16 --  this is a follow-up visit for complicated chronic cough. 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  She continues to have cough. May be worst in the am, sometimes needs to clear plugs.  She is having sinus congestion; she goes on / off a nasal steroid, currently off.  Seems to benefit from albuterol, using a few times a  Day.           ROV 09/04/16 -- Ms. Hanisch has a history of bronchiectasis, M. 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    We repeated a CT scan of the chest on 08/30/16 that I have personally reviewed.  This shows continued diffuse areas of bronchiectasis with some mucoid impaction and some persistent isolated nodularity. There is some increased cavitary change noted with at least 2 of these nodules on the right. Her original bronchoscopy was 04/08/07 (negative for [facility]), AFB positive on sputum from 05/17/11, negative on sputum from 08/04/15. She is coughing frequently - productive of greenish phlegm. Uses albuterol a few times a week.   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 Her CT scan from last month showed progressive mucoid impaction with  some persistent isolated nodularity, increased cavitary changes in some of the nodules. Her culture for [facility] has been positive in 2013 (negatiove 08/04/15). Rechecked AFB on 09/06/16, smear negative, culture pending. I treated her with Levaquin for 10 days in the event that this was a typical bacterium with bronchiectasis flare. We also restarted her Mucinex, continued Allegra and Flonase. She reports today that she took the levaquin. Sx are minimally changed, still coughs up thick mucous.   ROV 12/12/16 -- pt has a hx of bronchiectasis and severe chronic cough, MAIC and MRSA in the past (treated). Repeat sputum / BAL 10/13/16 > positive for M avium. She doesn't feel very different. She is clearing more mucous with hypertonic saline.     Vitals:   12/13/16 1427  BP: 98/60  Pulse: 83  SpO2: 95%  Weight: 101 lb (45.8 kg)  Height: 5' 4.5" (1.638 m)   Gen: Pleasant, Thin woman, in no distress,  normal affect  ENT: No lesions,  mouth clear,  oropharynx clear, no postnasal drip  Neck: No JVD, no stridor  Lungs: No use of accessory muscles, Few inspiratory and expiratory squeaks more so on the right  Cardiovascular: RRR, heart sounds normal, no murmur or gallops, no peripheral edema  Musculoskeletal: No deformities, no cyanosis or clubbing  Neuro: alert, non focal  Skin: Warm, no lesions or rashes  08/30/16 COMPARISON:  08/16/2015  FINDINGS: Cardiovascular: Aortic atherosclerosis. Mild cardiomegaly, without pericardial effusion.  Mediastinum/Nodes: Similar precarinal node at 9 mm on image 61/series 2. Hilar regions poorly evaluated without  intravenous contrast.  Lungs/Pleura: No pleural fluid.  Again identified is a relatively diffuse but slightly lower lobe predominant pattern of cylindrical bronchiectasis, mucoid impaction, and clustered nodularity. Areas of progression are identified, including within the posterior left upper lobe on image 62/ series 4 and periphery of the  left lower lobe.  Larger pulmonary nodules are again identified. Example in the right upper lobe at 10 x 8 mm on image 49/ series 4. Compare 13 x 8 mm on the prior, decreased. Just inferior to this in the right upper lobe, nodularity measures maximally 8 mm today on image 56/ series 4 versus 9 mm on the prior. New cavitation within. Increased mucoid impaction in the right upper lobe including on image 45/series 4.  Upper Abdomen: Normal imaged portions of the liver, spleen, stomach, pancreas, gallbladder, adrenal glands, kidneys.  Musculoskeletal: No acute osseous abnormality.  IMPRESSION: 1. Slight progression of constellation of findings, including bronchial wall thickening, mucoid impaction, clustered nodularity. 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 2.  Aortic atherosclerosis.   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 She will need to be treated with a daily regimen. Based on sensitivities we will start clarithromycin, rifampin, ethambutol.  We will start antibiotics for [facility] today as below: - clarithromycin 549m twice a day - rifampin 4542mdaily - ethambutol 7002maily Continue to use your hypertonic saline.  Follow with Dr ByrLamonte Sakai 3 months or sooner if you have any problems.  Allergic rhinitis due to pollen We will try changing your allegra and flonase to alternatives when your supply runs out.  Nasacort plus loratadine    RobBaltazar ApoD, PhD 12/13/2016, 5:11 PM [facility] [phone] if no answer [phone]

## 2016-12-18 ENCOUNTER — Other Ambulatory Visit: Payer: Self-pay | Admitting: Family Medicine

## 2016-12-18 ENCOUNTER — Telehealth: Payer: Self-pay | Admitting: Emergency Medicine

## 2016-12-18 MED ORDER — ETHAMBUTOL HCL 100 MG PO TABS: 300.0000 mg | ORAL_TABLET | Freq: Every day | ORAL | 5 refills | Status: DC

## 2016-12-18 MED ORDER — CLARITHROMYCIN 500 MG PO TABS: 500.0000 mg | ORAL_TABLET | Freq: Two times a day (BID) | ORAL | 5 refills | Status: DC

## 2016-12-18 MED ORDER — RIFAMPIN 150 MG PO CAPS: 450.0000 mg | ORAL_CAPSULE | Freq: Every day | ORAL | 5 refills | Status: DC

## 2016-12-18 MED ORDER — ETHAMBUTOL HCL 400 MG PO TABS: 400.0000 mg | ORAL_TABLET | Freq: Every day | ORAL | 5 refills | Status: DC

## 2016-12-18 NOTE — Telephone Encounter (Signed)
Spoke with patient. She was seen by RB on 9.12.18 and had 3 medications called into for her. All 3 were sent to the wrong pharmacy. She stated that this is the 2nd time that this has happened. Apologized for the mistake and reassured her that the medications would be called into [facility] for her. She verbalized understanding. Nothing else needed at time of call.

## 2016-12-25 ENCOUNTER — Telehealth: Payer: Self-pay | Admitting: Emergency Medicine

## 2016-12-25 NOTE — Telephone Encounter (Signed)
She is correct, this could be a side effect of the rifampin. Please ask her to skip one more dose to ensure no further chills. Then have her restart to see if she tolerates. If she gets recurrent side effects and we will have to stop the medication.

## 2016-12-25 NOTE — Telephone Encounter (Signed)
Spoke with pt.  She started Clarithromycin and Ethambutol on Friday, 9/21 because she could take this with food.  She started Rifampin on Saturday, 9/22 on an empty stomach in the morning.  2 hrs after taking Rifampin she started shaking with chills.  This lasted until the evening and then started getting better.  Pt did not have any of these symptoms on Sunday but also did not take the Rifampin.  She not sure if this is a side effect of the Rifampin or not and if she should continue to take this.  Please advise.

## 2016-12-25 NOTE — Telephone Encounter (Signed)
Spoke with pt and advised of Dr Kavin Leech recommendations.  Pt verbalized understanding.  Pt will call back with update when med is restarted.

## 2017-01-10 ENCOUNTER — Telehealth: Payer: Self-pay | Admitting: Emergency Medicine

## 2017-01-10 NOTE — Telephone Encounter (Signed)
Spoke with patient. She stated that she had restarted taking rifampin back in September. Immediately after taking this, she developed flu like symptoms that lasted only for a day. She stopped taking the medication until this past Monday. She took the 3 capsules again, and the symptoms returned.   She wanted to make RB aware that she did in fact try the medication but she can't tolerate the symptoms. She wants to have it listed in her allergies.   Will route to RB so that he is aware.

## 2017-01-11 NOTE — Telephone Encounter (Signed)
Let her know I agree that she should stay off the rifampin. Continue the rest of her regimen as she taking it

## 2017-01-11 NOTE — Telephone Encounter (Signed)
Spoke with pt,aware of recs.  Nothing further needed.  

## 2017-01-19 ENCOUNTER — Telehealth: Payer: Self-pay | Admitting: Family Medicine

## 2017-01-19 NOTE — Telephone Encounter (Signed)
Spoke with patient and informed her per Dr.Steve Luking- Actually you are the exact kind if senior that should get this shot.Cant take even on antibiotic therapy . Patient verbalized understanding.

## 2017-01-19 NOTE — Telephone Encounter (Signed)
Called [facility] and informed them per Dr.Steve Luking patient may take extra strength flu shot on antibiotic therapy. Pharmacy technician verbalized understanding.

## 2017-01-19 NOTE — Telephone Encounter (Signed)
Patient wants to know if she should take the senior flu shot or regular one because she has been taking a strong antibiotic for a month prescribe by her specialist and drug store wanted her to check with her doctor before they gave it to her.She wanted to know your opinion on this.

## 2017-01-19 NOTE — Telephone Encounter (Signed)
Actually she is the EXACT kind of senior that should get this shot (with her chronic pulmonary disease and recurrent infxns) can take even when on abx

## 2017-01-21 DIAGNOSIS — Z23 Encounter for immunization: Secondary | ICD-10-CM | POA: Diagnosis not present

## 2017-01-31 ENCOUNTER — Encounter: Payer: Self-pay | Admitting: Family Medicine

## 2017-01-31 ENCOUNTER — Ambulatory Visit (INDEPENDENT_AMBULATORY_CARE_PROVIDER_SITE_OTHER): Payer: Medicare Other | Admitting: Family Medicine

## 2017-01-31 VITALS — BP 142/70 | Temp 98.0°F | Ht 64.5 in | Wt 103.0 lb

## 2017-01-31 DIAGNOSIS — N3000 Acute cystitis without hematuria: Secondary | ICD-10-CM

## 2017-01-31 DIAGNOSIS — R300 Dysuria: Secondary | ICD-10-CM

## 2017-01-31 LAB — POCT URINALYSIS DIPSTICK
Spec Grav, UA: <=1.005 — AB (ref 1.010–1.025)
pH, UA: 5 (ref 5.0–8.0)

## 2017-01-31 NOTE — Progress Notes (Signed)
   Subjective:    Patient ID: Mary RoyaltySandra M Vasek, female    DOB: 1946-03-27, 71 y.o.   MRN: [id number]  Urinary Tract Infection   This is a new problem. Episode onset: one week. She has tried increased fluids (azo) for the symptoms.   Results for orders placed or performed in visit on 01/31/17  POCT urinalysis dipstick  Result Value Ref Range   Color, UA     Clarity, UA     Glucose, UA     Bilirubin, UA     Ketones, UA     Spec Grav, UA <=1.005 (A) 1.010 - 1.025   Blood, UA     pH, UA 5.0 5.0 - 8.0   Protein, UA     Urobilinogen, UA  0.2 or 1.0 E.U./dL   Nitrite, UA +    Leukocytes, UA Small (1+) (A) Negative   Has had burning and sig itching  rxed self for itching  Burning cont to recur      Review of Systems No headache, no major weight loss or weight gain, no chest pain no back pain abdominal pain no change in bowel habits complete ROS otherwise negative     Objective:   Physical Exam  Alert vitals stable, NAD. Blood pressure good on repeat. HEENT normal. Lungs clear. Heart regular rate and rhythm.   Urinalysis several clusters of white blood cells per high-power field      Assessment & Plan:  Impression urinary tract infection.  Of note patient had been to specialist who recommended always getting urine culture before initiating new medications.  We will monitor this and press forward.  Addendum culture came back positive prescribed Augmentin

## 2017-02-02 LAB — URINE CULTURE

## 2017-02-05 ENCOUNTER — Other Ambulatory Visit: Payer: Self-pay | Admitting: *Deleted

## 2017-02-05 MED ORDER — CEFPROZIL 500 MG PO TABS: 500.0000 mg | ORAL_TABLET | Freq: Two times a day (BID) | ORAL | 0 refills | Status: DC

## 2017-03-23 LAB — MAC SUSCEPTIBILITY BROTH

## 2017-04-09 ENCOUNTER — Encounter: Payer: Self-pay | Admitting: Emergency Medicine

## 2017-04-09 ENCOUNTER — Ambulatory Visit (INDEPENDENT_AMBULATORY_CARE_PROVIDER_SITE_OTHER): Payer: Medicare Other | Admitting: Emergency Medicine

## 2017-04-09 DIAGNOSIS — J301 Allergic rhinitis due to pollen: Secondary | ICD-10-CM | POA: Diagnosis not present

## 2017-04-09 DIAGNOSIS — J479 Bronchiectasis, uncomplicated: Secondary | ICD-10-CM

## 2017-04-09 DIAGNOSIS — A310 Pulmonary mycobacterial infection: Secondary | ICD-10-CM

## 2017-04-09 NOTE — Assessment & Plan Note (Signed)
Bronchiectasis with increased mucus burden in the setting of mycobacterial disease.  She is tolerating clarithromycin and ethambutol.  She could not take the rifampin.  We will continue her on 2 drug therapy.  I suspect she will need 9-12 months.  She has completed 3 months.  I will discuss the timing of a repeat CT scan of the chest with her one were getting to the end of her planned regimen.  Also recheck her sputum cultures at that time.

## 2017-04-09 NOTE — Assessment & Plan Note (Signed)
Not currently on therapy.  She did not think it change the cough

## 2017-04-09 NOTE — Assessment & Plan Note (Signed)
Albuterol prn

## 2017-04-09 NOTE — Progress Notes (Signed)
Mary [name], female    DOB: 12-07-1945, 72 y.o.   MRN: [id number] HPI Mary [name] is a 72 year old woman whom I have followed for chronic cough. 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 Evaluated 2011 by Dr Annamaria Boots for allergies - shows multiple sensitivities that are present year round.       ROV 04/24/16 --  this is a follow-up visit for complicated chronic cough. 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  She continues to have cough. May be worst in the am, sometimes needs to clear plugs.  She is having sinus congestion; she goes on / off a nasal steroid, currently off.  Seems to benefit from albuterol, using a few times a  Day.           ROV 09/04/16 -- Mary [name] has a history of bronchiectasis, M. 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    We repeated a CT scan of the chest on 08/30/16 that I have personally reviewed.  This shows continued diffuse areas of bronchiectasis with some mucoid impaction and some persistent isolated nodularity. There is some increased cavitary change noted with at least 2 of these nodules on the right. Her original bronchoscopy was 04/08/07 (negative for [facility]), AFB positive on sputum from 05/17/11, negative on sputum from 08/04/15. She is coughing frequently - productive of greenish phlegm. Uses albuterol a few times a week.   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 Her CT scan from last month showed progressive mucoid impaction with  some persistent isolated nodularity, increased cavitary changes in some of the nodules. Her culture for [facility] has been positive in 2013 (negatiove 08/04/15). Rechecked AFB on 09/06/16, smear negative, culture pending. I treated her with Levaquin for 10 days in the event that this was a typical bacterium with bronchiectasis flare. We also restarted her Mucinex, continued Allegra and Flonase. She reports today that she took the levaquin. Sx are minimally changed, still coughs up thick mucous.   ROV 12/12/16 -- pt has a hx of bronchiectasis and severe chronic cough, MAIC and MRSA in the past (treated). Repeat sputum / BAL 10/13/16 > positive for M avium. She doesn't feel very different. She is clearing more mucous with hypertonic saline.   ROV 04/09/17 --Mary with history of chronic cough, bronchiectasis, contributions of allergic rhinitis and a Zenker's diverticulum.  She has [facility] and is currently on therapy with ethambutol and clarithromycin since 9/24.  She felt terrible on rifampin. She is "wiped out" on the abx. She is not currently on Flonase, Allegra. She coughs daily, white / gray mucous, usually in afternoon and evening. Not currently on hypertonic saline.     Vitals:   04/09/17 1413  BP: 124/60  Pulse: 83  SpO2: 96%  Weight: 102 lb (46.3 kg)  Height: _0  (1.626 m)   Gen: Pleasant, Thin woman, in no distress,  normal affect  ENT: No lesions,  mouth clear,  oropharynx clear, no postnasal drip  Neck: No JVD, no stridor  Lungs: No use of accessory muscles, Few inspiratory and expiratory  squeaks more so on the right  Cardiovascular: RRR, heart sounds normal, no murmur or gallops, no peripheral edema  Musculoskeletal: No deformities, no cyanosis or clubbing  Neuro: alert, non focal  Skin: Warm, no lesions or rashes  08/30/16 COMPARISON:  08/16/2015  FINDINGS: Cardiovascular: Aortic atherosclerosis. Mild cardiomegaly, without pericardial effusion.  Mediastinum/Nodes: Similar  precarinal node at 9 mm on image 61/series 2. Hilar regions poorly evaluated without intravenous contrast.  Lungs/Pleura: No pleural fluid.  Again identified is a relatively diffuse but slightly lower lobe predominant pattern of cylindrical bronchiectasis, mucoid impaction, and clustered nodularity. Areas of progression are identified, including within the posterior left upper lobe on image 62/ series 4 and periphery of the left lower lobe.  Larger pulmonary nodules are again identified. Example in the right upper lobe at 10 x 8 mm on image 49/ series 4. Compare 13 x 8 mm on the prior, decreased. Just inferior to this in the right upper lobe, nodularity measures maximally 8 mm today on image 56/ series 4 versus 9 mm on the prior. New cavitation within. Increased mucoid impaction in the right upper lobe including on image 45/series 4.  Upper Abdomen: Normal imaged portions of the liver, spleen, stomach, pancreas, gallbladder, adrenal glands, kidneys.  Musculoskeletal: No acute osseous abnormality.  IMPRESSION: 1. Slight progression of constellation of findings, including bronchial wall thickening, mucoid impaction, clustered nodularity. 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 2.  Aortic atherosclerosis.   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  She is tolerating clarithromycin and ethambutol.  She could not take the rifampin.  We will continue her on 2 drug therapy.  I suspect she will need 9-12 months.  She has completed 3 months.  I will discuss the timing of a repeat CT scan of the chest with her one were getting to the end of her planned regimen.  Also recheck her sputum cultures at that time.  BRONCHIECTASIS Albuterol prn  Allergic rhinitis due to pollen Not currently on therapy.  She did not think it  change the cough    Baltazar Apo, MD, PhD 04/09/2017, 2:30 PM [facility] [place] [facility] [phone] or if no answer [phone]

## 2017-04-09 NOTE — Patient Instructions (Signed)
Please continue clarithromycin and ethambutol as you have been taking them Keep albuterol available to use 2 puffs as needed Follow with Dr Delton CoombesByrum in 3 months or sooner if you have any problems.

## 2017-04-30 DIAGNOSIS — H16203 Unspecified keratoconjunctivitis, bilateral: Secondary | ICD-10-CM | POA: Diagnosis not present

## 2017-04-30 DIAGNOSIS — H16143 Punctate keratitis, bilateral: Secondary | ICD-10-CM | POA: Diagnosis not present

## 2017-04-30 DIAGNOSIS — H168 Other keratitis: Secondary | ICD-10-CM | POA: Diagnosis not present

## 2017-05-14 DIAGNOSIS — Z1231 Encounter for screening mammogram for malignant neoplasm of breast: Secondary | ICD-10-CM | POA: Diagnosis not present

## 2017-05-17 ENCOUNTER — Telehealth: Payer: Self-pay | Admitting: Family Medicine

## 2017-05-17 NOTE — Telephone Encounter (Signed)
[facility]

## 2017-05-18 ENCOUNTER — Telehealth: Payer: Self-pay | Admitting: Emergency Medicine

## 2017-05-18 MED ORDER — LEVOFLOXACIN 500 MG PO TABS: 500.0000 mg | ORAL_TABLET | Freq: Every day | ORAL | 0 refills | Status: DC

## 2017-05-18 NOTE — Telephone Encounter (Signed)
I thought she was on both clarithro and ethambutol. Did the clarithro get stopped? While bronchiectasis can sometimes result in intermittent bleeding in the airway, she needs a CXR and to be seen to look for PNA, consider treating with a different antibiotic for an acute flare (as opposed to her chronic mycobacterial abx).

## 2017-05-18 NOTE — Telephone Encounter (Signed)
Please ask her to stop the clarithromycin and ethambutol.  Start Levaquin 500 mg daily for the next 7 days.  She will be finishing this when she sees Tammy and can review her cough and sputum production, blood

## 2017-05-18 NOTE — Telephone Encounter (Signed)
Left message for patient to call back  

## 2017-05-18 NOTE — Telephone Encounter (Signed)
RB please advise, patient is taking both the ethambutol and the claritho however she is wanting to know if she needs to continue to take these or stop until she is seen in the office. She is scheduled with TP on 2.20 due to you being booked up. Please advise, thanks.

## 2017-05-18 NOTE — Telephone Encounter (Signed)
RB please advise, patient called and stated she has been on ethambutol for a few months now. She states that she started coughing up bright red blood on Tuesday night around 4pm. She has continued to cough up blood since. She has already taken her medication for today. Please advise on this, thanks.

## 2017-05-18 NOTE — Telephone Encounter (Signed)
Called and advised patient, sent prescription in. Nothing further needed.

## 2017-05-23 ENCOUNTER — Ambulatory Visit (INDEPENDENT_AMBULATORY_CARE_PROVIDER_SITE_OTHER)
Admission: RE | Admit: 2017-05-23 | Discharge: 2017-05-23 | Disposition: A | Discharge status: Home / Self Care | Payer: Medicare Other | Source: Ambulatory Visit | Attending: Adult Health | Admitting: Adult Health

## 2017-05-23 ENCOUNTER — Ambulatory Visit (INDEPENDENT_AMBULATORY_CARE_PROVIDER_SITE_OTHER): Payer: Medicare Other | Admitting: Adult Health

## 2017-05-23 ENCOUNTER — Encounter: Payer: Self-pay | Admitting: Adult Health

## 2017-05-23 VITALS — BP 112/64 | HR 78 | Ht 64.0 in | Wt 102.0 lb

## 2017-05-23 DIAGNOSIS — J301 Allergic rhinitis due to pollen: Secondary | ICD-10-CM | POA: Diagnosis not present

## 2017-05-23 DIAGNOSIS — R042 Hemoptysis: Secondary | ICD-10-CM | POA: Diagnosis not present

## 2017-05-23 DIAGNOSIS — R05 Cough: Secondary | ICD-10-CM

## 2017-05-23 DIAGNOSIS — R059 Cough, unspecified: Secondary | ICD-10-CM

## 2017-05-23 DIAGNOSIS — J479 Bronchiectasis, uncomplicated: Secondary | ICD-10-CM

## 2017-05-23 NOTE — Assessment & Plan Note (Signed)
Cont on current regimen  

## 2017-05-23 NOTE — Progress Notes (Signed)
_0  ID: Mary [name], female    DOB: Aug 19, 1945, 72 y.o.   MRN: [id number]  Chief Complaint  Patient presents with  . Follow-up    Bronchiectasis     Referring provider: Mikey Kirschner, MD  HPI: 72 yo female never smoker followed for bronchiectasis , M Avium , MRSA colonization, Chronic cough , AR and GERD   TEST  Sputum [employer] 2013 +MAI  CT chest 08/2016 progressive mucoid impaction with some persistent isolated nodularity, increased cavitary changes in some of the nodules Repeat sputum / BAL 10/13/16 > positive for M avium  05/23/2017 Follow up : Bronchiectasis , MAI , Chronic cough and AR  Patient returns for a follow-up.  Patient has known bronchiectasis with MAI.  She is on clarithromycin and ethambutol.  Patient complains over the last week she has had increased cough congestion with blood-tinged mucus.  Patient was instructed to stop her clarithromycin and ethambutol on February 15.  She was to begin Levaquin 500 mg daily for 7 days.  Patient says she is feeling about the same but the blood tinged mucus has resolved . Mucus is now back to her baseline with yellow to green mucus on /off. She has a daily cough that is severe at times for years. She has frequent exercerbations that require abx . She is on flutter valve , hypertonic nebs but still has very thick mucus. Most recent CT chest in May 2018 shows progressive Bronchiectasis .  Chest x-ray today shows chronic changes with no acute process noted.  Chest x-ray reviewed personally agree with impression. No fever, chest pain, orthopnea or edema She is very frustrated with her cough and lack of improvement .   Allergies  Allergen Reactions  . Rifampin   . Sulfamethoxazole-Trimethoprim     REACTION: Rash  . Doxycycline Rash    Splotches on skin    Immunization History  Administered Date(s) Administered  . Influenza Split 01/24/2012  . Influenza Whole 01/01/2009, 01/01/2010, 01/02/2011  . Influenza,inj,Quad PF,6+  Mos 01/26/2014, 02/10/2015, 02/02/2016  . Influenza-Unspecified 01/06/2013, 02/08/2015, 01/21/2017  . Pneumococcal Conjugate-13 01/26/2014  . Pneumococcal Polysaccharide-23 04/04/2003, 10/25/2015    Past Medical History:  Diagnosis Date  . Allergic rhinitis    using Nasocort daily and Allegra daily  . Arthritis    "hands" (08/01/2013)  . Bronchiectasis    uses Albuterol daily as needed but has been a while since used(more than a month ago),  . Chronic cough   . COPD (chronic obstructive pulmonary disease) ([place])   . Diverticulosis   . GERD (gastroesophageal reflux disease)    takes Prilosec daily  . History of bladder infections    takes Keflex daily;Dr.Ottin is Dealer  . History of MRSA infection    several yrs ago  . Joint pain   . Mycobacterium avium complex ([place])   . Osteoporosis   . Osteoporosis    takes Fosamax weekly  . Pneumonia 1990's  . PONV (postoperative nausea and vomiting)   . Zenker's diverticulum     Tobacco History: Social History   Tobacco Use  Smoking Status Never Smoker  Smokeless Tobacco Never Used   Counseling given: Not Answered   Outpatient Encounter Medications as of 05/23/2017  Medication Sig  . albuterol (PROVENTIL) (2.5 MG/3ML) 0.083% nebulizer solution Take 3 mLs (2.5 mg total) by nebulization every 6 (six) hours as needed for wheezing or shortness of breath.  Marland Kitchen aspirin EC 81 MG tablet Take 81 mg by mouth daily.  Marland Kitchen conjugated  estrogens (PREMARIN) vaginal cream Premarin 0.625 mg/gram vaginal cream three times week  . levofloxacin (LEVAQUIN) 500 MG tablet Take 1 tablet (500 mg total) by mouth daily.  . naproxen sodium (ANAPROX) 220 MG tablet Take 220 mg by mouth daily as needed (pain).  . sodium chloride HYPERTONIC 3 % nebulizer solution Take 4 mLs by nebulization 2 (two) times daily.  . VENTOLIN HFA 108 (90 Base) MCG/ACT inhaler INHALE 2 PUFFS EVERY SIX HOURS AS NEEDED FOR WHEEZING OR SHORTNESS OF BREATH  . cefPROZIL (CEFZIL) 500 MG  tablet Take 1 tablet (500 mg total) 2 (two) times daily by mouth. (Patient not taking: Reported on 05/23/2017)  . ethambutol (MYAMBUTOL) 100 MG tablet Take 3 tablets (300 mg total) by mouth daily. (Patient not taking: Reported on 05/23/2017)  . ethambutol (MYAMBUTOL) 400 MG tablet Take 1 tablet (400 mg total) by mouth daily. (Patient not taking: Reported on 05/23/2017)   No facility-administered encounter medications on file as of 05/23/2017.      Review of Systems  Constitutional:   No  weight loss, night sweats,  Fevers, chills, fatigue, or  lassitude.  HEENT:   No headaches,  Difficulty swallowing,  Tooth/dental problems, or  Sore throat,                No sneezing, itching, ear ache,  +nasal congestion, post nasal drip,   CV:  No chest pain,  Orthopnea, PND, swelling in lower extremities, anasarca, dizziness, palpitations, syncope.   GI  No heartburn, indigestion, abdominal pain, nausea, vomiting, diarrhea, change in bowel habits, loss of appetite, bloody stools.   Resp:   No chest wall deformity  Skin: no rash or lesions.  GU: no dysuria, change in color of urine, no urgency or frequency.  No flank pain, no hematuria   MS:  No joint pain or swelling.  No decreased range of motion.  No back pain.    Physical Exam  BP 112/64 (BP Location: Left Arm, Cuff Size: Normal)   Pulse 78   Ht _0  (1.626 m)   Wt 102 lb (46.3 kg)   SpO2 94%   BMI 17.51 kg/m   GEN: A/Ox3; pleasant , NAD, thin and frail    HEENT:  [place]/AT,  EACs-clear, TMs-wnl, NOSE-clear, THROAT-clear, no lesions, no postnasal drip or exudate noted.   NECK:  Supple w/ fair ROM; no JVD; normal carotid impulses w/o bruits; no thyromegaly or nodules palpated; no lymphadenopathy.    RESP  Scattered rhonchi ,  no accessory muscle use, no dullness to percussion  CARD:  RRR, no m/r/g, no peripheral edema, pulses intact, no cyanosis or clubbing.  GI:   Soft & nt; nml bowel sounds; no organomegaly or masses detected.    Musco: Warm bil, no deformities or joint swelling noted.   Neuro: alert, no focal deficits noted.    Skin: Warm, no lesions or rashes    Lab Results:  CBC  BNP No results found for: BNP  ProBNP No results found for: PROBNP  Imaging: Dg Chest 2 View  Result Date: 05/23/2017 CLINICAL DATA:  Hemoptysis.  History of MAC. EXAM: CHEST  2 VIEW COMPARISON:  CT chest dated Aug 30, 2016. Chest x-ray dated July 23, 2013. FINDINGS: The heart size and mediastinal contours are within normal limits. Normal pulmonary vascularity. The lungs remain hyperinflated with emphysematous changes. Scattered nodularity involving the right upper lobe, right middle lobe, lingula, and bilateral lower lobes is again noted and not significantly changed. No focal consolidation, pleural  effusion, or pneumothorax. No acute osseous abnormality. IMPRESSION: 1. Findings consistent with chronic atypical infection as seen on prior CT, not significantly changed. No acute superimposed cardiopulmonary process. Electronically Signed   By: Titus Dubin M.D.   On: 05/23/2017 09:44     Assessment & Plan:   BRONCHIECTASIS Acute exacerbation improving on Levaquin  Her case is complicated with MAI on chronic therapy , cough is very aggravating .  Will add VEST therapy  Cont on flutter and hypertonic nebs  Finish levaquin   Plan Patient Instructions  Finish Levaquin as directed.  Then resume Clarithromycin and Ethambutol .  Mucinex Twice daily   Hold Baby aspirin for 1 week then resume .  Continue on Hypertonic Neb Twice daily  .  Begin VEST therapy Twice daily.  Follow up with Dr. Lamonte Sakai  In 6 weeks as planned and As needed   Please contact office for sooner follow up if symptoms do not improve or worsen or seek emergency care           Allergic rhinitis due to pollen Cont on current regimen   COUGH, CHRONIC Add VEST  Cont on current regimen      Rexene Edison, NP 05/23/2017

## 2017-05-23 NOTE — Assessment & Plan Note (Signed)
Add VEST  Cont on current regimen

## 2017-05-23 NOTE — Assessment & Plan Note (Addendum)
Acute exacerbation improving on Levaquin  Her case is complicated with MAI on chronic therapy , cough is very aggravating .  Will add VEST therapy  Cont on flutter and hypertonic nebs  Finish levaquin  Patient has has daily productive cough lasting for years  requiring antibiotic therapy.  Flutter valve has been tried, but has not effectively mobilized secretions.  Considered and ruled out manual CPT as no consistent skilled caregiver available to perform therapy.  CT scan performed on 08/2016  confirms bronchiectasis.  Starting patient on vest therapy will be beneficial    Plan Patient Instructions  Finish Levaquin as directed.  Then resume Clarithromycin and Ethambutol .  Mucinex Twice daily   Hold Baby aspirin for 1 week then resume .  Continue on Hypertonic Neb Twice daily  .  Begin VEST therapy Twice daily.  Follow up with Dr. Delton CoombesByrum  In 6 weeks as planned and As needed   Please contact office for sooner follow up if symptoms do not improve or worsen or seek emergency care

## 2017-05-23 NOTE — Patient Instructions (Addendum)
Finish Levaquin as directed.  Then resume Clarithromycin and Ethambutol .  Mucinex Twice daily   Hold Baby aspirin for 1 week then resume .  Continue on Hypertonic Neb Twice daily  .  Begin VEST therapy Twice daily.  Follow up with Dr. Delton CoombesByrum  In 6 weeks as planned and As needed   Please contact office for sooner follow up if symptoms do not improve or worsen or seek emergency care

## 2017-05-23 NOTE — Addendum Note (Signed)
Addended by: Boone MasterJONES, JESSICA E on: 05/23/2017 11:01 AM   Modules accepted: Orders

## 2017-06-04 ENCOUNTER — Other Ambulatory Visit: Payer: Self-pay | Admitting: Emergency Medicine

## 2017-06-08 DIAGNOSIS — H353131 Nonexudative age-related macular degeneration, bilateral, early dry stage: Secondary | ICD-10-CM | POA: Diagnosis not present

## 2017-06-15 ENCOUNTER — Telehealth: Payer: Self-pay | Admitting: Adult Health

## 2017-06-15 NOTE — Telephone Encounter (Signed)
Discussed with TP: the current office note documentation should be sufficient.  The note states that patient is currently using flutter valve but still having "very thick mucus."  Note will not be addended.

## 2017-06-15 NOTE — Telephone Encounter (Signed)
Called [employer] and left a message with Fannie KneeSue to return our call.

## 2017-06-18 ENCOUNTER — Telehealth: Payer: Self-pay | Admitting: Adult Health

## 2017-06-18 NOTE — Telephone Encounter (Signed)
Called number provided, spoke with Fannie KneeSue advised her of TP response, she has asked that I send that OV note over to her. Note faxed, nothing further needed. Fax was sent to Fannie KneeSue at [phone]

## 2017-06-18 NOTE — Telephone Encounter (Signed)
Called and spoke with patient she states that she is going back and forth with [facility]. She states that they are telling her what we sent was not sufficient. I advised her that I spoke with Fannie KneeSue this morning and I had sent over the last OV that they had requested. They have suggested that the verbiage needs to be changed to where neither one of the methods she has tried for airway secretions have worked.   Routing this to TP to follow up on, TP please advise, thanks.

## 2017-06-18 NOTE — Telephone Encounter (Signed)
Spoke with the pt and notified that this was done. Nothing further needed.  

## 2017-06-18 NOTE — Telephone Encounter (Signed)
Per TP: done.

## 2017-06-27 DIAGNOSIS — H16223 Keratoconjunctivitis sicca, not specified as Sjogren's, bilateral: Secondary | ICD-10-CM | POA: Diagnosis not present

## 2017-06-27 DIAGNOSIS — H353130 Nonexudative age-related macular degeneration, bilateral, stage unspecified: Secondary | ICD-10-CM | POA: Diagnosis not present

## 2017-06-27 DIAGNOSIS — H1830 Unspecified corneal membrane change: Secondary | ICD-10-CM | POA: Diagnosis not present

## 2017-06-27 DIAGNOSIS — H16221 Keratoconjunctivitis sicca, not specified as Sjogren's, right eye: Secondary | ICD-10-CM | POA: Diagnosis not present

## 2017-06-27 DIAGNOSIS — H16222 Keratoconjunctivitis sicca, not specified as Sjogren's, left eye: Secondary | ICD-10-CM | POA: Diagnosis not present

## 2017-06-27 DIAGNOSIS — H2513 Age-related nuclear cataract, bilateral: Secondary | ICD-10-CM | POA: Diagnosis not present

## 2017-07-04 DIAGNOSIS — H2513 Age-related nuclear cataract, bilateral: Secondary | ICD-10-CM | POA: Diagnosis not present

## 2017-07-04 DIAGNOSIS — H1830 Unspecified corneal membrane change: Secondary | ICD-10-CM | POA: Diagnosis not present

## 2017-07-04 DIAGNOSIS — H353130 Nonexudative age-related macular degeneration, bilateral, stage unspecified: Secondary | ICD-10-CM | POA: Diagnosis not present

## 2017-07-04 DIAGNOSIS — H16223 Keratoconjunctivitis sicca, not specified as Sjogren's, bilateral: Secondary | ICD-10-CM | POA: Diagnosis not present

## 2017-07-09 ENCOUNTER — Encounter: Payer: Self-pay | Admitting: Emergency Medicine

## 2017-07-09 ENCOUNTER — Ambulatory Visit (INDEPENDENT_AMBULATORY_CARE_PROVIDER_SITE_OTHER): Payer: Medicare Other | Admitting: Emergency Medicine

## 2017-07-09 DIAGNOSIS — J479 Bronchiectasis, uncomplicated: Secondary | ICD-10-CM | POA: Diagnosis not present

## 2017-07-09 DIAGNOSIS — A310 Pulmonary mycobacterial infection: Secondary | ICD-10-CM | POA: Diagnosis not present

## 2017-07-09 DIAGNOSIS — R05 Cough: Secondary | ICD-10-CM | POA: Diagnosis not present

## 2017-07-09 DIAGNOSIS — R059 Cough, unspecified: Secondary | ICD-10-CM

## 2017-07-09 NOTE — Patient Instructions (Addendum)
Please continue to use your chest vest as you are using it.  Use your hypertonic saline nebulizer as you are able  Please continue your ethambutol and clarithromycin. We will plan to stop end of June 2019.  Follow with Dr Delton CoombesByrum in July 2019

## 2017-07-09 NOTE — Progress Notes (Signed)
Patient ID: Mary [name], female    DOB: Jan 16, 1946, 72 y.o.   MRN: [id number] HPI  ROV 12/12/16 -- pt has a hx of bronchiectasis and severe chronic cough, MAIC and MRSA in the past (treated). Repeat sputum / BAL 10/13/16 > positive for M avium. She doesn't feel very different. She is clearing more mucous with hypertonic saline.   ROV 04/09/17 --patient with history of chronic cough, bronchiectasis, contributions of allergic rhinitis and a Zenker's diverticulum.  She has [facility] and is currently on therapy with ethambutol and clarithromycin since 9/24.  She felt terrible on rifampin. She is "wiped out" on the abx. She is not currently on Flonase, Allegra. She coughs daily, white / gray mucous, usually in afternoon and evening. Not currently on hypertonic saline.   ROV 07/09/17 --Mary [name] is 56 with a history of chronic cough that is in part upper airway in nature but is usually driven by significant bronchiectasis with Mycobacterium avium infection.  She has been on ethambutol and clarithromycin since 12/25/16.  She had to stop it briefly with an acute exacerbation for which she was treated with levofloxacin in February. She still has some dry cough. She is using the chest vest now - having success getting mucous out.     Vitals:   07/09/17 1337  BP: 118/66  Pulse: 71  SpO2: 95%  Weight: 101 lb (45.8 kg)   Gen: Pleasant, Thin woman, in no distress,  normal affect  ENT: No lesions,  mouth clear,  oropharynx clear, no postnasal drip  Neck: No JVD, no stridor  Lungs: No use of accessory muscles, Few inspiratory and expiratory squeaks more so on the right  Cardiovascular: RRR, heart sounds normal, no murmur or gallops, no peripheral edema  Musculoskeletal: No deformities, no cyanosis or clubbing  Neuro: alert, non focal  Skin: Warm, no lesions or rashes  08/30/16 COMPARISON:  08/16/2015  FINDINGS: Cardiovascular: Aortic atherosclerosis. Mild cardiomegaly, without pericardial  effusion.  Mediastinum/Nodes: Similar precarinal node at 9 mm on image 61/series 2. Hilar regions poorly evaluated without intravenous contrast.  Lungs/Pleura: No pleural fluid.  Again identified is a relatively diffuse but slightly lower lobe predominant pattern of cylindrical bronchiectasis, mucoid impaction, and clustered nodularity. Areas of progression are identified, including within the posterior left upper lobe on image 62/ series 4 and periphery of the left lower lobe.  Larger pulmonary nodules are again identified. Example in the right upper lobe at 10 x 8 mm on image 49/ series 4. Compare 13 x 8 mm on the prior, decreased. Just inferior to this in the right upper lobe, nodularity measures maximally 8 mm today on image 56/ series 4 versus 9 mm on the prior. New cavitation within. Increased mucoid impaction in the right upper lobe including on image 45/series 4.  Upper Abdomen: Normal imaged portions of the liver, spleen, stomach, pancreas, gallbladder, adrenal glands, kidneys.  Musculoskeletal: No acute osseous abnormality.  IMPRESSION: 1. Slight progression of constellation of findings, including bronchial wall thickening, mucoid impaction, clustered nodularity. 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 2.  Aortic atherosclerosis.   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[place]) She has been on clarithro + ethambutol x 7 months. Will plan to treat for 9 total and then stop, reassess clinically. Likely also repeat her chest imaging at that time.   Please continue your ethambutol and clarithromycin. We will plan to stop end of June 2019.  Follow with Dr Lamonte Sakai in July 2019  BRONCHIECTASIS Please continue to use your chest vest as you are using it.  Use your hypertonic saline nebulizer as you are able   COUGH, CHRONIC Note she is not on GERD or rhinitis therapy at this time. Has  tried multiple regimens.     Baltazar Apo, MD, PhD 07/09/2017, 2:11 PM [facility] [phone] or if no answer [phone]

## 2017-07-09 NOTE — Assessment & Plan Note (Signed)
Note she is not on GERD or rhinitis therapy at this time. Has tried multiple regimens.

## 2017-07-09 NOTE — Assessment & Plan Note (Signed)
She has been on clarithro + ethambutol x 7 months. Will plan to treat for 9 total and then stop, reassess clinically. Likely also repeat her chest imaging at that time.   Please continue your ethambutol and clarithromycin. We will plan to stop end of June 2019.  Follow with Dr Delton CoombesByrum in July 2019

## 2017-07-09 NOTE — Assessment & Plan Note (Signed)
Please continue to use your chest vest as you are using it.  Use your hypertonic saline nebulizer as you are able

## 2017-07-11 DIAGNOSIS — H16223 Keratoconjunctivitis sicca, not specified as Sjogren's, bilateral: Secondary | ICD-10-CM | POA: Diagnosis not present

## 2017-07-11 DIAGNOSIS — H353130 Nonexudative age-related macular degeneration, bilateral, stage unspecified: Secondary | ICD-10-CM | POA: Diagnosis not present

## 2017-07-11 DIAGNOSIS — H2513 Age-related nuclear cataract, bilateral: Secondary | ICD-10-CM | POA: Diagnosis not present

## 2017-07-11 DIAGNOSIS — H1830 Unspecified corneal membrane change: Secondary | ICD-10-CM | POA: Diagnosis not present

## 2017-10-08 ENCOUNTER — Other Ambulatory Visit: Payer: Self-pay | Admitting: Family Medicine

## 2017-10-24 ENCOUNTER — Ambulatory Visit (INDEPENDENT_AMBULATORY_CARE_PROVIDER_SITE_OTHER): Payer: Medicare Other | Admitting: Emergency Medicine

## 2017-10-24 ENCOUNTER — Encounter: Payer: Self-pay | Admitting: Emergency Medicine

## 2017-10-24 DIAGNOSIS — J479 Bronchiectasis, uncomplicated: Secondary | ICD-10-CM

## 2017-10-24 DIAGNOSIS — J301 Allergic rhinitis due to pollen: Secondary | ICD-10-CM

## 2017-10-24 DIAGNOSIS — J471 Bronchiectasis with (acute) exacerbation: Secondary | ICD-10-CM | POA: Diagnosis not present

## 2017-10-24 NOTE — Assessment & Plan Note (Signed)
Continue your nasal steroid spray daily.

## 2017-10-24 NOTE — Progress Notes (Signed)
Patient ID: Mary [name], female    DOB: 1945-12-23, 72 y.o.   MRN: [id number] HPI  ROV 12/12/16 -- pt has a hx of bronchiectasis and severe chronic cough, MAIC and MRSA in the past (treated). Repeat sputum / BAL 10/13/16 > positive for M avium. She doesn't feel very different. She is clearing more mucous with hypertonic saline.   ROV 04/09/17 --patient with history of chronic cough, bronchiectasis, contributions of allergic rhinitis and a Zenker's diverticulum.  She has [facility] and is currently on therapy with ethambutol and clarithromycin since 9/24.  She felt terrible on rifampin. She is "wiped out" on the abx. She is not currently on Flonase, Allegra. She coughs daily, white / gray mucous, usually in afternoon and evening. Not currently on hypertonic saline.   ROV 07/09/17 --Mary [name] is 43 with a history of chronic cough that is in part upper airway in nature but is usually driven by significant bronchiectasis with Mycobacterium avium infection.  She has been on ethambutol and clarithromycin since 12/25/16.  She had to stop it briefly with an acute exacerbation for which she was treated with levofloxacin in February. She still has some dry cough. She is using the chest vest now - having success getting mucous out.   ROV 10/24/17 --this follow-up visit for patient with a history of bronchiectasis and associated chronic cough.  She also has an upper airway component to her cough.  She has been diagnosed with Mycobacterium avium and has been on ethambutol and clarithromycin from 12/2016 - June 2019.  She was treated in February for superimposed bacterial bronchitis with levofloxacin. She was also started on a chest vest - she believes that has been helpful in secretion clearance. She is clearing clear to yellowish / gray mucous. Rare pink tint. Volume is stable. She has not felt any worse since stopping the [facility] therapy last month. She uses albuterol rarely.    Vitals:   10/24/17 1343  BP: 106/62   Pulse: 86  SpO2: 96%  Weight: 101 lb 3.2 oz (45.9 kg)  Height: 5' 4.5" (1.638 m)   Gen: Pleasant, Thin woman, in no distress,  normal affect  ENT: No lesions,  mouth clear,  oropharynx clear, no postnasal drip  Neck: No JVD, no stridor  Lungs: No use of accessory muscles, Few inspiratory and expiratory squeaks more so on the right  Cardiovascular: RRR, heart sounds normal, no murmur or gallops, no peripheral edema  Musculoskeletal: No deformities, no cyanosis or clubbing  Neuro: alert, non focal  Skin: Warm, no lesions or rashes  08/30/16 COMPARISON:  08/16/2015  FINDINGS: Cardiovascular: Aortic atherosclerosis. Mild cardiomegaly, without pericardial effusion.  Mediastinum/Nodes: Similar precarinal node at 9 mm on image 61/series 2. Hilar regions poorly evaluated without intravenous contrast.  Lungs/Pleura: No pleural fluid.  Again identified is a relatively diffuse but slightly lower lobe predominant pattern of cylindrical bronchiectasis, mucoid impaction, and clustered nodularity. Areas of progression are identified, including within the posterior left upper lobe on image 62/ series 4 and periphery of the left lower lobe.  Larger pulmonary nodules are again identified. Example in the right upper lobe at 10 x 8 mm on image 49/ series 4. Compare 13 x 8 mm on the prior, decreased. Just inferior to this in the right upper lobe, nodularity measures maximally 8 mm today on image 56/ series 4 versus 9 mm on the prior. New cavitation within. Increased mucoid impaction in the right upper lobe including on image 45/series 4.  Upper Abdomen:  Normal imaged portions of the liver, spleen, stomach, pancreas, gallbladder, adrenal glands, kidneys.  Musculoskeletal: No acute osseous abnormality.  IMPRESSION: 1. Slight progression of constellation of findings, including bronchial wall thickening, mucoid impaction, clustered nodularity. 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 2.  Aortic atherosclerosis.   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[place]) She completed 9 months of ethambutol and clarithromycin in June.  We will stop these and check her sputum for any residual evidence for [facility].  BRONCHIECTASIS Please keep your albuterol available to use 2 puffs  Keep your saline nebulizer available to use as needed for secretion clearance.  Continue your chest vest as you are doing it We will check a CT chest high resolution no contrast to follow bronchiectasis.  Follow with Dr Lamonte Sakai in 4 months or sooner if you have any problems.  Allergic rhinitis due to pollen Continue your nasal steroid spray daily.     Baltazar Apo, MD, PhD 10/24/2017, 2:03 PM [facility] [phone] or if no answer [phone]

## 2017-10-24 NOTE — Assessment & Plan Note (Signed)
Please keep your albuterol available to use 2 puffs  Keep your saline nebulizer available to use as needed for secretion clearance.  Continue your chest vest as you are doing it We will check a CT chest high resolution no contrast to follow bronchiectasis.  Follow with Dr Delton CoombesByrum in 4 months or sooner if you have any problems.

## 2017-10-24 NOTE — Patient Instructions (Signed)
Please keep your albuterol available to use 2 puffs  Keep your saline nebulizer available to use as needed for secretion clearance.  Continue your chest vest as you are doing it Continue your nasal steroid spray daily.  We will check your sputum culture for clearance of your [facility].  We will check a CT chest high resolution no contrast to follow bronchiectasis.  Follow with Mary [name] in 4 months or sooner if you have any problems.

## 2017-10-24 NOTE — Assessment & Plan Note (Signed)
She completed 9 months of ethambutol and clarithromycin in June.  We will stop these and check her sputum for any [facility].

## 2017-10-29 ENCOUNTER — Other Ambulatory Visit: Payer: Medicare Other

## 2017-10-29 DIAGNOSIS — J471 Bronchiectasis with (acute) exacerbation: Secondary | ICD-10-CM | POA: Diagnosis not present

## 2017-11-09 ENCOUNTER — Ambulatory Visit (HOSPITAL_COMMUNITY)
Admission: RE | Admit: 2017-11-09 | Discharge: 2017-11-09 | Disposition: A | Discharge status: Home / Self Care | Payer: Medicare Other | Source: Ambulatory Visit | Attending: Emergency Medicine | Admitting: Emergency Medicine

## 2017-11-09 DIAGNOSIS — I2510 Atherosclerotic heart disease of native coronary artery without angina pectoris: Secondary | ICD-10-CM | POA: Insufficient documentation

## 2017-11-09 DIAGNOSIS — I700 Atherosclerosis of aorta: Secondary | ICD-10-CM | POA: Diagnosis not present

## 2017-11-09 DIAGNOSIS — J471 Bronchiectasis with (acute) exacerbation: Secondary | ICD-10-CM | POA: Diagnosis not present

## 2017-11-09 DIAGNOSIS — J479 Bronchiectasis, uncomplicated: Secondary | ICD-10-CM | POA: Diagnosis not present

## 2017-11-19 ENCOUNTER — Other Ambulatory Visit: Payer: Self-pay | Admitting: Family Medicine

## 2017-12-17 ENCOUNTER — Other Ambulatory Visit: Payer: Self-pay | Admitting: Family Medicine

## 2017-12-19 ENCOUNTER — Telehealth: Payer: Self-pay | Admitting: Emergency Medicine

## 2017-12-19 NOTE — Telephone Encounter (Signed)
Please let pt know that her AFB cx is positive. Don't know the speciation or sensitivities yet. We can review further when all of the information is available.

## 2017-12-19 NOTE — Telephone Encounter (Signed)
Received call report from Tamera with [facility]  AFB ordered at the 7.24.19 visit with RB Partial sputum obtained from patient on 10-29-17 @ 9:34am Preliminary results abnormal with AFB present in the liquid culture medium Identification to follow   Routing to RB

## 2017-12-19 NOTE — Telephone Encounter (Signed)
partial sputum obtained 10/29/17 @ 9:34am preliminary results abnormal with AFB present in the liquid culture medium identification to follow

## 2017-12-20 NOTE — Telephone Encounter (Signed)
Spoke with pt. She is aware of his results. Nothing further was needed.

## 2017-12-21 DIAGNOSIS — Z23 Encounter for immunization: Secondary | ICD-10-CM | POA: Diagnosis not present

## 2017-12-22 ENCOUNTER — Telehealth: Payer: Self-pay | Admitting: Pulmonary Disease

## 2017-12-22 NOTE — Telephone Encounter (Signed)
Results called in from [facility] 12/22/2017  No acid-fast bacillus  PCR reveals Mycobacterium avium complex

## 2017-12-25 LAB — MYCOBACTERIA,CULT W/FLUOROCHROME SMEAR
MICRO NUMBER:: 90893313
SMEAR: NONE SEEN
SPECIMEN QUALITY:: ADEQUATE

## 2017-12-25 LAB — RESPIRATORY CULTURE OR RESPIRATORY AND SPUTUM CULTURE
MICRO NUMBER:: 90893314
RESULT: NORMAL
SPECIMEN QUALITY:: ADEQUATE

## 2017-12-25 NOTE — Telephone Encounter (Signed)
I will review with her when we follow in clinic. We will have to make a decision about whether there pros of rx outweigh the problems associated.

## 2018-01-03 NOTE — Telephone Encounter (Signed)
none

## 2018-01-14 ENCOUNTER — Other Ambulatory Visit: Payer: Self-pay | Admitting: Family Medicine

## 2018-01-14 ENCOUNTER — Encounter: Payer: Self-pay | Admitting: Family Medicine

## 2018-01-14 ENCOUNTER — Ambulatory Visit (INDEPENDENT_AMBULATORY_CARE_PROVIDER_SITE_OTHER): Payer: Medicare Other | Admitting: Family Medicine

## 2018-01-14 VITALS — BP 140/80 | Temp 97.5°F | Ht 64.5 in | Wt 102.8 lb

## 2018-01-14 DIAGNOSIS — J470 Bronchiectasis with acute lower respiratory infection: Secondary | ICD-10-CM

## 2018-01-14 DIAGNOSIS — R062 Wheezing: Secondary | ICD-10-CM | POA: Diagnosis not present

## 2018-01-14 MED ORDER — ALBUTEROL SULFATE HFA 108 (90 BASE) MCG/ACT IN AERS
2.0000 | INHALATION_SPRAY | Freq: Four times a day (QID) | RESPIRATORY_TRACT | 2 refills | Status: DC | PRN
Start: 2018-01-14 — End: 2018-06-17

## 2018-01-14 MED ORDER — AMOXICILLIN-POT CLAVULANATE 875-125 MG PO TABS: ORAL_TABLET | ORAL | 0 refills | Status: DC

## 2018-01-14 NOTE — Progress Notes (Signed)
   Subjective:    Patient ID: Mary [name], female    DOB: 06/05/1945, 72 y.o.   MRN: [id number]  Cough  This is a new problem. The current episode started in the past 7 days. The cough is productive of sputum. Associated symptoms include rhinorrhea and wheezing. Treatments tried: cough drops.   Cough took a sudden turn for the worse   Last few days   Worse at night   Poss low gr fever  Streaks of blood an d mucus, had some exposure s to sickness  g kids sick some but not aond much    no recent flares   Review of Systems  HENT: Positive for rhinorrhea.   Respiratory: Positive for cough and wheezing.        Objective:   Physical Exam Alert active mild malaise.  HEENT some congestion.  Lungs rare wheeze with deep breath positive baseline respiratory crackles heart regular rate and rhythm.       Assessment & Plan:  Impression exacerbation of bronchiectasis plan antibiotics prescribed.  Patient has not been using her inhaler encouraged to use albuterol prescribed symptom care discussed

## 2018-02-06 ENCOUNTER — Ambulatory Visit (INDEPENDENT_AMBULATORY_CARE_PROVIDER_SITE_OTHER): Payer: Medicare Other | Admitting: Emergency Medicine

## 2018-02-06 ENCOUNTER — Encounter: Payer: Self-pay | Admitting: Emergency Medicine

## 2018-02-06 DIAGNOSIS — J479 Bronchiectasis, uncomplicated: Secondary | ICD-10-CM

## 2018-02-06 DIAGNOSIS — A310 Pulmonary mycobacterial infection: Secondary | ICD-10-CM

## 2018-02-06 DIAGNOSIS — J301 Allergic rhinitis due to pollen: Secondary | ICD-10-CM | POA: Diagnosis not present

## 2018-02-06 DIAGNOSIS — R05 Cough: Secondary | ICD-10-CM | POA: Diagnosis not present

## 2018-02-06 DIAGNOSIS — R059 Cough, unspecified: Secondary | ICD-10-CM

## 2018-02-06 NOTE — Progress Notes (Signed)
Patient ID: Mary [name], female    DOB: Dec 05, 1945, 72 y.o.   MRN: [id number] HPI  ROV 12/12/16 -- pt has a hx of bronchiectasis and severe chronic cough, MAIC and MRSA in the past (treated). Repeat sputum / BAL 10/13/16 > positive for M avium. She doesn't feel very different. She is clearing more mucous with hypertonic saline.   ROV 04/09/17 --patient with history of chronic cough, bronchiectasis, contributions of allergic rhinitis and a Zenker's diverticulum.  She has [facility] and is currently on therapy with ethambutol and clarithromycin since 9/24.  She felt terrible on rifampin. She is "wiped out" on the abx. She is not currently on Flonase, Allegra. She coughs daily, white / gray mucous, usually in afternoon and evening. Not currently on hypertonic saline.   ROV 07/09/17 --Mary [name] is 4 with a history of chronic cough that is in part upper airway in nature but is usually driven by significant bronchiectasis with Mycobacterium avium infection.  She has been on ethambutol and clarithromycin since 12/25/16.  She had to stop it briefly with an acute exacerbation for which she was treated with levofloxacin in February. She still has some dry cough. She is using the chest vest now - having success getting mucous out.   ROV 10/24/17 --this follow-up visit for patient with a history of bronchiectasis and associated chronic cough.  She also has an upper airway component to her cough.  She has been diagnosed with Mycobacterium avium and has been on ethambutol and clarithromycin from 12/2016 - June 2019.  She was treated in February for superimposed bacterial bronchitis with levofloxacin. She was also started on a chest vest - she believes that has been helpful in secretion clearance. She is clearing clear to yellowish / gray mucous. Rare pink tint. Volume is stable. She has not felt any worse since stopping the [facility] therapy last month. She uses albuterol rarely.   ROV 02/06/18 --72 year old woman with a  history of chronic cough with an upper airway component but also with significant bronchiectasis and Mycobacterium avium infection/colonization.  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  I stopped her antibiotics in July and we performed a sputum culture.  Unfortunately this again showed Mycobacterium avium as well as a sensitive Proteus mirabilis. She still has a lot of rhinitis, is sensitive to perfumes, smells. She has been on multiple nasal sprays, allergy shots before. She may be having some reflux, happens at night when supine.  She is coughing a lot lately. Was treated once with amoxil since our last visit.  She uses the chest vest about twice a day - she clears a lot of mucous with it.    Vitals:   02/06/18 1620  BP: 122/80  Pulse: 79  SpO2: 99%  Weight: 103 lb (46.7 kg)  Height: 5' 4.5" (1.638 m)   Gen: Pleasant, Thin woman, in no distress,  normal affect  ENT: No lesions,  mouth clear,  oropharynx clear, no postnasal drip  Neck: No JVD, no stridor  Lungs: No use of accessory muscles, Few inspiratory and expiratory squeaks more so on the right  Cardiovascular: RRR, heart sounds normal, no murmur or gallops, no peripheral edema  Musculoskeletal: No deformities, no cyanosis or clubbing  Neuro: alert, non focal  Skin: Warm, no lesions or rashes  08/30/16 COMPARISON:  08/16/2015  FINDINGS: Cardiovascular: Aortic atherosclerosis. Mild cardiomegaly, without pericardial effusion.  Mediastinum/Nodes: Similar precarinal node at 9 mm on image  61/series 2. Hilar regions poorly evaluated without intravenous contrast.  Lungs/Pleura: No pleural fluid.  Again identified is a relatively diffuse but slightly lower lobe predominant pattern of cylindrical bronchiectasis, mucoid impaction, and clustered nodularity. Areas of progression are identified, including within the posterior left upper lobe on image 62/ series 4 and  periphery of the left lower lobe.  Larger pulmonary nodules are again identified. Example in the right upper lobe at 10 x 8 mm on image 49/ series 4. Compare 13 x 8 mm on the prior, decreased. Just inferior to this in the right upper lobe, nodularity measures maximally 8 mm today on image 56/ series 4 versus 9 mm on the prior. New cavitation within. Increased mucoid impaction in the right upper lobe including on image 45/series 4.  Upper Abdomen: Normal imaged portions of the liver, spleen, stomach, pancreas, gallbladder, adrenal glands, kidneys.  Musculoskeletal: No acute osseous abnormality.  IMPRESSION: 1. Slight progression of constellation of findings, including bronchial wall thickening, mucoid impaction, clustered nodularity. 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 2.  Aortic atherosclerosis.   BRONCHIECTASIS Slight progression by imaging most recent CT chest.  This in the setting of persistent mycobacterial colonization (after treatment with antibiotics for 9 months).  She will continue her good pulmonary hygiene, chest vest twice a day.  She is having good success with this.  Her typical chronic cough appears to be more upper airway in nature.  She is colonized with Proteus and MRSA as well as the Mycobacterium  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[place]) She is reluctant to restart treatment at this time, knows that it would be a year-long commitment.  We will hold off for now we will follow her clinically and probably radiographically.  COUGH, CHRONIC Certainly in part due to her bronchiectasis but large contribution of her upper airway irritation which she acknowledges.  She is having some low-grade GERD, intermittent probably does need therapy right now.  Also  chronic rhinitis which for now is undertreated.  We have tried multiple nasal sprays and therapies before including immunotherapy  without much improvement.  Currently she is not on anything.  We discussed referral back for a second opinion with allergy, possible immunotherapy again, possible reinstitution of her nasal regimen.    Baltazar Apo, MD, PhD 02/06/2018, 5:35 PM [facility] [phone] or if no answer [phone]

## 2018-02-06 NOTE — Assessment & Plan Note (Signed)
Slight progression by imaging most recent CT chest.  This in the setting of persistent mycobacterial colonization (after treatment with antibiotics for 9 months).  She will continue her good pulmonary hygiene, chest vest twice a day.  She is having good success with this.  Her typical chronic cough appears to be more upper airway in nature.  She is colonized with Proteus and MRSA as well as the Mycobacterium

## 2018-02-06 NOTE — Assessment & Plan Note (Signed)
Certainly in part due to her bronchiectasis but large contribution of her upper airway irritation which she acknowledges.  She is having some low-grade GERD, intermittent probably does need therapy right now.  Also  chronic rhinitis which for now is undertreated.  We have tried multiple nasal sprays and therapies before including immunotherapy without much improvement.  Currently she is not on anything.  We discussed referral back for a second opinion with allergy, possible immunotherapy again, possible reinstitution of her nasal regimen.

## 2018-02-06 NOTE — Patient Instructions (Addendum)
We will not restart antibiotics at this time. Please continue to use your chest vest 1-2 times daily to assist with mucus clearance. We will refer you for a repeat allergy evaluation to focus on any new allergic sensitivities, any medications that might help with your chronic rhinitis. Follow with Dr Delton Coombes in 4 months or sooner if you have any problems.

## 2018-02-06 NOTE — Assessment & Plan Note (Signed)
She is reluctant to restart treatment at this time, knows that it would be a year-long commitment.  We will hold off for now we will follow her clinically and probably radiographically.

## 2018-02-13 ENCOUNTER — Other Ambulatory Visit: Payer: Self-pay | Admitting: Family Medicine

## 2018-02-20 ENCOUNTER — Ambulatory Visit (HOSPITAL_COMMUNITY)
Admission: RE | Admit: 2018-02-20 | Discharge: 2018-02-20 | Disposition: A | Discharge status: Home / Self Care | Payer: Medicare Other | Source: Ambulatory Visit | Attending: Family Medicine | Admitting: Family Medicine

## 2018-02-20 ENCOUNTER — Encounter: Payer: Self-pay | Admitting: Family Medicine

## 2018-02-20 ENCOUNTER — Other Ambulatory Visit: Payer: Self-pay | Admitting: Family Medicine

## 2018-02-20 ENCOUNTER — Other Ambulatory Visit: Payer: Self-pay

## 2018-02-20 ENCOUNTER — Ambulatory Visit (INDEPENDENT_AMBULATORY_CARE_PROVIDER_SITE_OTHER): Payer: Medicare Other | Admitting: Family Medicine

## 2018-02-20 VITALS — BP 120/70 | Ht 64.5 in | Wt 102.8 lb

## 2018-02-20 DIAGNOSIS — J470 Bronchiectasis with acute lower respiratory infection: Secondary | ICD-10-CM

## 2018-02-20 DIAGNOSIS — M25552 Pain in left hip: Secondary | ICD-10-CM | POA: Diagnosis not present

## 2018-02-20 DIAGNOSIS — R109 Unspecified abdominal pain: Secondary | ICD-10-CM | POA: Diagnosis not present

## 2018-02-20 DIAGNOSIS — M25559 Pain in unspecified hip: Secondary | ICD-10-CM | POA: Diagnosis not present

## 2018-02-20 DIAGNOSIS — Z131 Encounter for screening for diabetes mellitus: Secondary | ICD-10-CM

## 2018-02-20 DIAGNOSIS — R300 Dysuria: Secondary | ICD-10-CM | POA: Diagnosis not present

## 2018-02-20 DIAGNOSIS — R5383 Other fatigue: Secondary | ICD-10-CM

## 2018-02-20 DIAGNOSIS — M160 Bilateral primary osteoarthritis of hip: Secondary | ICD-10-CM | POA: Diagnosis not present

## 2018-02-20 DIAGNOSIS — M24852 Other specific joint derangements of left hip, not elsewhere classified: Secondary | ICD-10-CM | POA: Diagnosis not present

## 2018-02-20 DIAGNOSIS — Z130 Encounter for screening for diseases of the blood and blood-forming organs and certain disorders involving the immune mechanism: Secondary | ICD-10-CM | POA: Diagnosis not present

## 2018-02-20 LAB — POCT URINALYSIS DIPSTICK
SPEC GRAV UA: 1.02 (ref 1.010–1.025)
pH, UA: 5 (ref 5.0–8.0)

## 2018-02-20 NOTE — Progress Notes (Signed)
   Subjective:    Patient ID: Mary RoyaltySandra M [name], female    DOB: 04-07-1945, 72 y.o.   MRN: [id number]  HPIBilateral leg and foot pain for awhile. Has seen neurology. Having numbness, stinging and pain. Pt has neuropathy , saw a neurologist long time ago, was told had neuropathy but elected not to get tests    Both feet painful      Notes left hip pain l  Left side pain. Started about one week ago. Pain is off and on. Sometimes hurts when she moves. Pain when lying down at night. Dull pain sometimes and sometimes stabbing pain. Left lower abd , near the hip, not going on long, mye a week and a half,  Pt coughs a lot, long and hard with the bronchiectasis cough, notes soreness from sometimes pain is sharp, will catch with mtion, soetimes achey      Results for orders placed or performed in visit on 02/20/18  POCT urinalysis dipstick  Result Value Ref Range   Color, UA     Clarity, UA     Glucose, UA     Bilirubin, UA     Ketones, UA     Spec Grav, UA 1.020 1.010 - 1.025   Blood, UA     pH, UA 5.0 5.0 - 8.0   Protein, UA     Urobilinogen, UA     Nitrite, UA +    Leukocytes, UA Small (1+) (A) Negative   Appearance     Odor     Pos dysuria for awhile, longstanding, seemingly worse lately, pos hx of freq uti's   Pt notes discomfort with itching and burning, but no urgency, not any now    Review of Systems No headache, no major weight loss or weight gain, no chest pain no back pain abdominal pain no change in bowel habits complete ROS otherwise negative     Objective:   Physical Exam Alert no acute distress.  HEENT some chronic nasal congestion pharynx normal neck supple lungs basilar crackles which is baseline no tachypnea heart rate and rhythm abdomen some left lower quadrant tenderness to deep palpation towards it.  Good range of motion however with some pain with rotation.  Urinalysis occasional white blood cell 2-3 per high-power field       Assessment & Plan:   Impression possible subacute UTI.  Culture urine rationale discussed  2.  Left lower abdominal pain.  Likely strain secondary to coughing discussed  3.  Left hip pain.  Discussed  4.  Sensory neuropathy.  Persistent.  Now uncomfortable at times.  Discussed.  Patient wishes to hold off on nerve conduction studies but will do appropriate blood work  Further recommendations based on results

## 2018-02-21 LAB — GLUCOSE, RANDOM: Glucose: 166 mg/dL — ABNORMAL HIGH (ref 65–99)

## 2018-02-21 LAB — VITAMIN B12: VITAMIN B 12: 650 pg/mL (ref 232–1245)

## 2018-02-21 LAB — FOLATE: Folate: >20 ng/mL (ref >3.0)

## 2018-02-21 LAB — HEMOGLOBIN A1C
ESTIMATED AVERAGE GLUCOSE: 114 mg/dL
Hgb A1c MFr Bld: 5.6 % (ref 4.8–5.6)

## 2018-02-22 LAB — URINE CULTURE

## 2018-02-22 LAB — SPECIMEN STATUS REPORT

## 2018-02-25 ENCOUNTER — Other Ambulatory Visit: Payer: Self-pay | Admitting: Family Medicine

## 2018-02-25 MED ORDER — NITROFURANTOIN MONOHYD MACRO 100 MG PO CAPS: ORAL_CAPSULE | ORAL | 0 refills | Status: DC

## 2018-03-06 DIAGNOSIS — J3089 Other allergic rhinitis: Secondary | ICD-10-CM | POA: Diagnosis not present

## 2018-03-06 DIAGNOSIS — J301 Allergic rhinitis due to pollen: Secondary | ICD-10-CM | POA: Diagnosis not present

## 2018-03-06 DIAGNOSIS — J479 Bronchiectasis, uncomplicated: Secondary | ICD-10-CM | POA: Diagnosis not present

## 2018-03-06 DIAGNOSIS — R05 Cough: Secondary | ICD-10-CM | POA: Diagnosis not present

## 2018-04-08 ENCOUNTER — Ambulatory Visit (INDEPENDENT_AMBULATORY_CARE_PROVIDER_SITE_OTHER): Payer: Medicare Other | Admitting: Family Medicine

## 2018-04-08 ENCOUNTER — Encounter: Payer: Self-pay | Admitting: Family Medicine

## 2018-04-08 ENCOUNTER — Ambulatory Visit (HOSPITAL_COMMUNITY)
Admission: RE | Admit: 2018-04-08 | Discharge: 2018-04-08 | Disposition: A | Discharge status: Home / Self Care | Payer: Medicare Other | Source: Ambulatory Visit | Attending: Family Medicine | Admitting: Family Medicine

## 2018-04-08 VITALS — BP 128/70 | Ht 64.5 in | Wt 103.4 lb

## 2018-04-08 DIAGNOSIS — M79674 Pain in right toe(s): Secondary | ICD-10-CM | POA: Diagnosis not present

## 2018-04-08 DIAGNOSIS — M25552 Pain in left hip: Secondary | ICD-10-CM

## 2018-04-08 NOTE — Progress Notes (Signed)
   Subjective:    Patient ID: Mary [name], female    DOB: 1946-01-27, 73 y.o.   MRN: [id number]  Foot Pain  This is a new problem. The current episode started more than 1 month ago. Pertinent negatives include no fever or joint swelling. Treatments tried: changing shoes.  pain in from pressure on right great toe. States if she wears sandals it is better.   Starts bothering her around lunch time, reports pain to right toe on top of her nail. As the day goes on has increased pain affecting her gait. Has tried different shoes. Reports neuropathy for long time to both feet up to her ankles. Has toenail fungal infection, has been trying otc creams for without relief. Has not noticed any increased redness around the nail or purulent drainage.   Wanting referral to ortho for chronic left hip pain - was seen in November for this by Dr. Brett Canales who recommended ortho referral if pain persists.   Review of Systems  Constitutional: Negative for fever and unexpected weight change.  Musculoskeletal: Positive for gait problem. Negative for joint swelling.       Objective:   Physical Exam Vitals signs and nursing note reviewed.  Constitutional:      General: She is not in acute distress.    Appearance: Normal appearance. She is not toxic-appearing.  HENT:     Head: Normocephalic and atraumatic.  Pulmonary:     Effort: No respiratory distress.  Musculoskeletal:     Comments: Feet: Pedal pulses and post. tib pulses intact bilaterally. No edema or erythema noted. No purulent drainage noted. Right great toe: no tenderness on palpation of great toe nail. No tenderness or swelling to 1st MTP joint. Bilateral fungal infection noted to great toe nails.   Skin:    General: Skin is warm and dry.  Neurological:     Mental Status: She is alert and oriented to person, place, and time.  Psychiatric:        Mood and Affect: Mood normal.           Assessment & Plan:  1. Great toe pain, right Will  obtain xray of the right foot to r/o possibility of fracture d/t osteoporosis hx. Referral to podiatry recommended for further evaluation and treatment. Recommend Dr. Nolen Mu in [place].  2. Pain of left hip joint Based on note from Dr. Brett Canales on patient's xray of hip in November pt should f/u with orthopedic referral if having continued problems, will place this referral today.  Dr. Lubertha South was consulted on this case and is in agreement with the above treatment plan.

## 2018-04-15 ENCOUNTER — Other Ambulatory Visit: Payer: Self-pay | Admitting: Family Medicine

## 2018-04-15 ENCOUNTER — Encounter: Payer: Self-pay | Admitting: Family Medicine

## 2018-04-24 ENCOUNTER — Encounter (INDEPENDENT_AMBULATORY_CARE_PROVIDER_SITE_OTHER): Payer: Self-pay | Admitting: Orthopaedic Surgery

## 2018-04-24 ENCOUNTER — Ambulatory Visit (INDEPENDENT_AMBULATORY_CARE_PROVIDER_SITE_OTHER): Payer: Medicare Other | Admitting: Orthopaedic Surgery

## 2018-04-24 VITALS — BP 121/63 | HR 73 | Ht 64.5 in | Wt 104.0 lb

## 2018-04-24 DIAGNOSIS — M160 Bilateral primary osteoarthritis of hip: Secondary | ICD-10-CM | POA: Insufficient documentation

## 2018-04-24 NOTE — Progress Notes (Signed)
Office Visit Note   Patient: Mary [name]           Date of Birth: 1945-05-10           MRN: [id number] Visit Date: 04/24/2018              Requested by: Mary [name] [place],  [id number] PCP: Mary Kirschner, MD   Assessment & Plan: Visit Diagnoses:  1. Bilateral primary osteoarthritis of hip     Plan: Discussion over 45 minutes 50% of the time in counseling regarding her diagnosis of bilateral hip osteoarthritis.  I have also discussed in detail the different treatment options including cortisone injection.  After much discussion Mary [name] would like to try a course of physical therapy.  She will continue with limited use of NSAIDs and Tylenol.  She is aware of the definitive treatment of hip replacement  Follow-Up Instructions: Return if symptoms worsen or fail to improve.   Orders:  Orders Placed This Encounter  Procedures  . Ambulatory referral to Physical Therapy   No orders of the defined types were placed in this encounter.     Procedures: No procedures performed   Clinical Data: No additional findings.   Subjective: Chief Complaint  Patient presents with  . Left Hip - Pain  Patient presents with left hip and groin pain x one year. She denies any known injury. She states that it feels like it "wants to give way" and is weak.  She has a history of neuropathy in both legs of unknown origin. [name] does have a part-time job and notes that when she is on her feet for any length of time she began to experience groin and anterior thigh pain.  She is having as much trouble on the right as she does on the left.  She denies any back pain.  Gets some relief with Aleve or Tylenol. I did review films of her pelvis and both hips on the PACS system.  There is considerable arthritis in both hips.  On the right there is subchondral cysts peripheral osteophytes and narrowing of the joint space.  On the left there is similar  findings with a valgus appearance.  Both hips are covered.  Diffuse bony demineralization no protrusio   HPI  Review of Systems   Objective: Vital Signs: BP 121/63   Pulse 73   Ht 5' 4.5" (1.638 m)   Wt 104 lb (47.2 kg)   BMI 17.58 kg/m   Physical Exam Constitutional:      Appearance: She is well-developed.  Eyes:     Pupils: Pupils are equal, round, and reactive to light.  Pulmonary:     Effort: Pulmonary effort is normal.  Skin:    General: Skin is warm and dry.  Neurological:     Mental Status: She is alert and oriented to person, place, and time.  Psychiatric:        Behavior: Behavior normal.     Ortho Exam wake alert and oriented x3.  Comfortable sitting.  Some discomfort when she initially gets up from a sitting position referable to her hips but does not use any ambulatory aid.  Does have some painful range of motion of both of her hips.  Motion approximately 20 degrees from neutral with internal and external rotation.  No distal edema.  Altered sensibility in both feet related to her neuropathy.  Straight leg raise negative.  No percussible back pain.  Left  leg is slightly shorter by about 1/8 of an inch compared to the right.  No distal edema Specialty Comments:  No specialty comments available.  Imaging: No results found.   PMFS History: Patient Active Problem List   Diagnosis Date Noted  . Bilateral primary osteoarthritis of hip 04/24/2018  . Fatigue 09/28/2016  . Special screening for malignant neoplasms, colon   . Peripheral sensory neuropathy 01/26/2014  . Malnutrition of moderate degree ([place]) 08/02/2013  . Zenker's diverticulum 08/01/2013  . Dysphagia, pharyngoesophageal phase 04/09/2013  . Allergic rhinitis due to pollen 06/13/2010  . ALLERGIC DRUG REACTION, SULFA 04/24/2007  . 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[place]) 12/06/2006  . BRONCHIECTASIS 12/06/2006  . POSTMENOPAUSAL STATUS 12/06/2006  . Osteoporosis 12/06/2006  .  COUGH, CHRONIC 12/06/2006   Past Medical History:  Diagnosis Date  . Allergic rhinitis    using Nasocort daily and Allegra daily  . Arthritis    "hands" (08/01/2013)  . Bronchiectasis    uses Albuterol daily as needed but has been a while since used(more than a month ago),  . Chronic cough   . COPD (chronic obstructive pulmonary disease) ([place])   . Diverticulosis   . GERD (gastroesophageal reflux disease)    takes Prilosec daily  . History of bladder infections    takes Keflex daily;Dr.Ottin is Dealer  . History of MRSA infection    several yrs ago  . Joint pain   . Mycobacterium avium complex ([place])   . Osteoporosis   . Osteoporosis    takes Fosamax weekly  . Pneumonia 1990's  . PONV (postoperative nausea and vomiting)   . Zenker's diverticulum     Family History  Problem Relation Age of Onset  . Allergies Mother   . Heart disease Mother   . Heart disease Father   . Stroke Father     Past Surgical History:  Procedure Laterality Date  . ANTERIOR CERVICAL DECOMP/DISCECTOMY FUSION  ~ 1992  . BREAST BIOPSY Left   . COLONOSCOPY    . COLONOSCOPY N/A 09/16/2015   Procedure: COLONOSCOPY;  Surgeon: Daneil Dolin, MD;  Location: [facility];  Service: Endoscopy;  Laterality: N/A;  11:00 Am  . ESOPHAGOGASTRODUODENOSCOPY    . VIDEO BRONCHOSCOPY Bilateral 10/13/2016   Procedure: VIDEO BRONCHOSCOPY WITHOUT FLUORO;  Surgeon: Collene Gobble, MD;  Location: [facility];  Service: Cardiopulmonary;  Laterality: Bilateral;  . ZENKER'S DIVERTICULECTOMY ENDOSCOPIC  08/01/2013   Social History   Occupational History  . 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  Tobacco Use  . Smoking status: Never Smoker  . Smokeless tobacco: Never Used  Substance and Sexual Activity  . Alcohol use: No  . Drug use: No  . Sexual activity: Not Currently

## 2018-04-29 DIAGNOSIS — M160 Bilateral primary osteoarthritis of hip: Secondary | ICD-10-CM | POA: Diagnosis not present

## 2018-05-03 DIAGNOSIS — M160 Bilateral primary osteoarthritis of hip: Secondary | ICD-10-CM | POA: Diagnosis not present

## 2018-05-06 ENCOUNTER — Ambulatory Visit (INDEPENDENT_AMBULATORY_CARE_PROVIDER_SITE_OTHER): Payer: Medicare Other | Admitting: Family Medicine

## 2018-05-06 ENCOUNTER — Encounter: Payer: Self-pay | Admitting: Family Medicine

## 2018-05-06 VITALS — BP 134/70 | Temp 98.1°F | Ht 64.5 in | Wt 103.0 lb

## 2018-05-06 DIAGNOSIS — J470 Bronchiectasis with acute lower respiratory infection: Secondary | ICD-10-CM | POA: Diagnosis not present

## 2018-05-06 MED ORDER — CLARITHROMYCIN 500 MG PO TABS: 500.0000 mg | ORAL_TABLET | Freq: Two times a day (BID) | ORAL | 0 refills | Status: AC

## 2018-05-06 MED ORDER — PREDNISONE 20 MG PO TABS: 40.0000 mg | ORAL_TABLET | Freq: Every day | ORAL | 0 refills | Status: AC

## 2018-05-06 NOTE — Progress Notes (Signed)
   Subjective:    Patient ID: Mary [name], female    DOB: January 28, 1946, 73 y.o.   MRN: [id number]  Sinusitis  This is a new problem. Episode onset: 3 days. Associated symptoms include congestion, coughing and shortness of breath. Pertinent negatives include no ear pain, sinus pressure or sore throat. (Wheezing) Treatments tried: nebulizer, cough syrup, inhaler.   4-5 day hx of cough and congestion. Cough productive of yellow/gray mucous, sometimes has some streaks of blood. Feels short of breath. Using albuterol once a day, but can't tell a difference. Using chest vibration machine bid. Reports possible low grade fever intermittently. Feels very low energy.   Took some delsym last night and helped somewhat.   States husband was recently sick with a cold.   Hx of bronchiectasis. Does see pulmonologist, but unable to get in for acute visits very easily.   Review of Systems  Constitutional: Negative for fever.  HENT: Positive for congestion. Negative for ear pain, sinus pressure and sore throat.   Respiratory: Positive for cough, shortness of breath and wheezing.        Objective:   Physical Exam Vitals signs and nursing note reviewed.  Constitutional:      General: She is not in acute distress.    Appearance: Normal appearance. She is not toxic-appearing.  HENT:     Head: Normocephalic and atraumatic.     Right Ear: Tympanic membrane normal.     Left Ear: Tympanic membrane normal.     Nose: Nose normal.     Mouth/Throat:     Mouth: Mucous membranes are moist.     Pharynx: Oropharynx is clear.  Eyes:     General:        Right eye: No discharge.        Left eye: No discharge.  Neck:     Musculoskeletal: Neck supple. No neck rigidity.  Cardiovascular:     Rate and Rhythm: Normal rate and regular rhythm.     Heart sounds: Normal heart sounds.  Pulmonary:     Effort: Pulmonary effort is normal. No respiratory distress.     Breath sounds: Wheezing present. No rales.    Lymphadenopathy:     Cervical: No cervical adenopathy.  Skin:    General: Skin is warm and dry.  Neurological:     Mental Status: She is alert and oriented to person, place, and time.           Assessment & Plan:  Bronchiectasis with acute lower respiratory infection (HCC)  Exacerbation of bronchiectasis, will treat with biaxin x 10 days. Wheezing noted on exam will treat with prednisone burst x 5 days. Reminded patient that she can take her albuterol inhaler up to q4-6h prn wheezing/shortness of breath. Symptomatic care discussed. Warning signs discussed. If her symptoms continue recommend she f/u with her pulmonologist.   Dr. Lubertha South was consulted on this case and is in agreement with the above treatment plan.

## 2018-05-20 DIAGNOSIS — G629 Polyneuropathy, unspecified: Secondary | ICD-10-CM | POA: Diagnosis not present

## 2018-05-20 DIAGNOSIS — B351 Tinea unguium: Secondary | ICD-10-CM | POA: Diagnosis not present

## 2018-05-22 ENCOUNTER — Other Ambulatory Visit: Payer: Self-pay | Admitting: Family Medicine

## 2018-06-03 DIAGNOSIS — Z681 Body mass index (BMI) 19 or less, adult: Secondary | ICD-10-CM | POA: Diagnosis not present

## 2018-06-03 DIAGNOSIS — Z1231 Encounter for screening mammogram for malignant neoplasm of breast: Secondary | ICD-10-CM | POA: Diagnosis not present

## 2018-06-03 DIAGNOSIS — Z124 Encounter for screening for malignant neoplasm of cervix: Secondary | ICD-10-CM | POA: Diagnosis not present

## 2018-06-05 ENCOUNTER — Other Ambulatory Visit: Payer: Self-pay | Admitting: Obstetrics & Gynecology

## 2018-06-05 ENCOUNTER — Other Ambulatory Visit: Payer: Self-pay | Admitting: Emergency Medicine

## 2018-06-05 DIAGNOSIS — R928 Other abnormal and inconclusive findings on diagnostic imaging of breast: Secondary | ICD-10-CM

## 2018-06-10 ENCOUNTER — Ambulatory Visit: Payer: Medicare Other

## 2018-06-10 ENCOUNTER — Ambulatory Visit
Admission: RE | Admit: 2018-06-10 | Discharge: 2018-06-10 | Disposition: A | Discharge status: Home / Self Care | Payer: Medicare Other | Source: Ambulatory Visit | Attending: Obstetrics & Gynecology | Admitting: Obstetrics & Gynecology

## 2018-06-10 DIAGNOSIS — R928 Other abnormal and inconclusive findings on diagnostic imaging of breast: Secondary | ICD-10-CM

## 2018-06-10 DIAGNOSIS — R922 Inconclusive mammogram: Secondary | ICD-10-CM | POA: Diagnosis not present

## 2018-06-12 ENCOUNTER — Other Ambulatory Visit: Payer: Self-pay

## 2018-06-12 ENCOUNTER — Ambulatory Visit (INDEPENDENT_AMBULATORY_CARE_PROVIDER_SITE_OTHER): Payer: Medicare Other | Admitting: Family Medicine

## 2018-06-12 ENCOUNTER — Encounter: Payer: Self-pay | Admitting: Family Medicine

## 2018-06-12 VITALS — BP 162/70 | Temp 98.2°F | Ht 64.5 in | Wt 103.0 lb

## 2018-06-12 DIAGNOSIS — N3000 Acute cystitis without hematuria: Secondary | ICD-10-CM | POA: Diagnosis not present

## 2018-06-12 DIAGNOSIS — R300 Dysuria: Secondary | ICD-10-CM | POA: Diagnosis not present

## 2018-06-12 LAB — POCT URINALYSIS DIPSTICK
Blood, UA: POSITIVE
NITRITE UA: POSITIVE
Spec Grav, UA: 1.01 (ref 1.010–1.025)
pH, UA: 5 (ref 5.0–8.0)

## 2018-06-12 MED ORDER — NITROFURANTOIN MONOHYD MACRO 100 MG PO CAPS: 100.0000 mg | ORAL_CAPSULE | Freq: Two times a day (BID) | ORAL | 0 refills | Status: DC

## 2018-06-12 MED ORDER — AMOXICILLIN-POT CLAVULANATE 875-125 MG PO TABS: 1.0000 | ORAL_TABLET | Freq: Two times a day (BID) | ORAL | 0 refills | Status: AC

## 2018-06-12 NOTE — Progress Notes (Signed)
   Subjective:    Patient ID: Mary [name], female    DOB: 01/17/1946, 73 y.o.   MRN: [id number]  HPI Patient is here today with complaints of dysuria.  Patient states she has been having some burning and stinging with urination for the last two days.  She has been taking Azo and increased water intake.   Results for orders placed or performed in visit on 06/12/18  POCT urinalysis dipstick  Result Value Ref Range   Color, UA     Clarity, UA     Glucose, UA     Bilirubin, UA     Ketones, UA     Spec Grav, UA 1.010 1.010 - 1.025   Blood, UA Positive    pH, UA 5.0 5.0 - 8.0   Protein, UA     Urobilinogen, UA     Nitrite, UA Positive    Leukocytes, UA Trace (A) Negative   Appearance     Odor    due to see urologist mmarch 30   Review of Systems No follow-up-no high fever no headache no chest pain    Objective:   Physical Exam  Alert vitals stable, NAD. Blood pressure good on repeat. HEENT normal. Lungs clear. Heart regular rate and rhythm. No CVA tenderness  Urinalysis numerous white blood cells per high-power field      Assessment & Plan:  Impression urinary tract infection antibiotics prescribed symptom care discussed await culture results

## 2018-06-14 LAB — SPECIMEN STATUS REPORT

## 2018-06-14 LAB — URINE CULTURE

## 2018-06-17 ENCOUNTER — Encounter: Payer: Self-pay | Admitting: Emergency Medicine

## 2018-06-17 ENCOUNTER — Other Ambulatory Visit: Payer: Self-pay

## 2018-06-17 ENCOUNTER — Ambulatory Visit (INDEPENDENT_AMBULATORY_CARE_PROVIDER_SITE_OTHER): Payer: Medicare Other | Admitting: Emergency Medicine

## 2018-06-17 DIAGNOSIS — R05 Cough: Secondary | ICD-10-CM

## 2018-06-17 DIAGNOSIS — R059 Cough, unspecified: Secondary | ICD-10-CM

## 2018-06-17 DIAGNOSIS — J301 Allergic rhinitis due to pollen: Secondary | ICD-10-CM | POA: Diagnosis not present

## 2018-06-17 DIAGNOSIS — J479 Bronchiectasis, uncomplicated: Secondary | ICD-10-CM

## 2018-06-17 NOTE — Assessment & Plan Note (Signed)
Please continue your Astelin, ipratropium, Nasacort nasal sprays as you have been using them. Follow with Dr Gary Fleet as planned.

## 2018-06-17 NOTE — Patient Instructions (Addendum)
Please continue your saline nebulizer treatment and chest vest routine as you have been doing it. Restart your nasal saline rinses 1-2 times daily Please continue your Astelin, ipratropium, Nasacort nasal sprays as you have been using them. Follow with Dr Gary Fleet as planned.  Keep albuterol nebulizer available to use if needed for phlegm clearance, coughing, shortness of breath Please call our office if you have any change in the amount, color, characteristics of your mucus.  If so we will likely want to send for culture information and then initiate antibiotic treatment. We will determine at your next visit the timing of any repeat imaging, chest x-ray or CT scan Follow with Dr Delton Coombes in 4 months or sooner if you have any problems.

## 2018-06-17 NOTE — Assessment & Plan Note (Signed)
Please continue your saline nebulizer treatment and chest vest routine as you have been doing it. Restart your nasal saline rinses 1-2 times daily Keep albuterol nebulizer available to use if needed for phlegm clearance, coughing, shortness of breath Please call our office if you have any change in the amount, color, characteristics of your mucus.  If so we will likely want to send for culture information and then initiate antibiotic treatment. We will determine at your next visit the timing of any repeat imaging, chest x-ray or CT scan Follow with Dr Delton Coombes in 4 months or sooner if you have any problems.

## 2018-06-17 NOTE — Progress Notes (Signed)
Patient ID: Mary [name], female    DOB: 1945/06/03, 73 y.o.   MRN: [id number] HPI  ROV 10/24/17 --this follow-up visit for patient with a history of bronchiectasis and associated chronic cough.  She also has an upper airway component to her cough.  She has been diagnosed with Mycobacterium avium and has been on ethambutol and clarithromycin from 12/2016 - June 2019.  She was treated in February for superimposed bacterial bronchitis with levofloxacin. She was also started on a chest vest - she believes that has been helpful in secretion clearance. She is clearing clear to yellowish / gray mucous. Rare pink tint. Volume is stable. She has not felt any worse since stopping the [facility] therapy last month. She uses albuterol rarely.   ROV 02/06/18 --73 year old woman with a history of chronic cough with an upper airway component but also with significant bronchiectasis and Mycobacterium avium infection/colonization.  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  I stopped her antibiotics in July and we performed a sputum culture.  Unfortunately this again showed Mycobacterium avium as well as a sensitive Proteus mirabilis. She still has a lot of rhinitis, is sensitive to perfumes, smells. She has been on multiple nasal sprays, allergy shots before. She may be having some reflux, happens at night when supine.  She is coughing a lot lately. Was treated once with amoxil since our last visit.  She uses the chest vest about twice a day - she clears a lot of mucous with it.    ROV 06/17/2018 --Mary [name] is 73, has a history of chronic cough in the setting of significant bronchiectasis, Mycobacterium avium, Proteus, MRSA colonization.  She also has upper airway irritability and cough that can happen with her allergic rhinitis and GERD.  Her last high-resolution CT was 11/09/2017.  At her last visit we agreed to defer Mycobacterium treatment again.  She uses a chest  vest 1-2 times daily.  I sent her back to be reevaluated at allergy, Dr Barnetta Chapel. She had skin testing, is sensitive to dust and grasses. She was started on astelin, atrovent NS prn, nasacort. She may be coughing a bit less but she does still have a lot of drainage. She clears phlegm effectively. She is currently using NaCl nebs in the am, chest vest in the evening. Occasional GERD, occasional dysphagia.    Vitals:   06/17/18 1326  BP: 108/60  Pulse: 72  SpO2: 95%  Weight: 105 lb (47.6 kg)  Height: 5' 4.5" (1.638 m)   Gen: Pleasant, Thin woman, in no distress,  normal affect  ENT: No lesions,  mouth clear,  oropharynx clear, no postnasal drip  Neck: No JVD, no stridor  Lungs: No use of accessory muscles, no wheezes, more clear today  Cardiovascular: RRR, heart sounds normal, no murmur or gallops, no peripheral edema  Musculoskeletal: No deformities, no cyanosis or clubbing  Neuro: alert, non focal  Skin: Warm, no lesions or rashes    11/09/2017 CT chest   COMPARISON:  Chest CT 08/30/2016.  FINDINGS: Cardiovascular: Heart size is normal. There is no significant pericardial fluid, thickening or pericardial calcification. There is aortic atherosclerosis, as well as atherosclerosis of the great vessels of the mediastinum and the coronary arteries, including calcified atherosclerotic plaque in the left anterior descending coronary artery.  Mediastinum/Nodes: No pathologically enlarged mediastinal or hilar lymph nodes. Please note that accurate exclusion of hilar adenopathy is limited on noncontrast CT scans. Esophagus is unremarkable  in appearance. No axillary lymphadenopathy.  Lungs/Pleura: High-resolution images again demonstrate scattered areas of cylindrical and mild varicose bronchiectasis throughout the lungs bilaterally. The areas of greatest involvement demonstrate extensive thickening of the peribronchovascular interstitium with surrounding septal thickening,  peribronchovascular micro and macro nodularity and regional areas of architectural distortion. No significant regions of ground-glass attenuation. No honeycombing. Inspiratory and expiratory imaging is unremarkable. No confluent consolidative airspace disease. No pleural effusions.  Upper Abdomen: Aortic atherosclerosis.  Musculoskeletal: There are no aggressive appearing lytic or blastic lesions noted in the visualized portions of the skeleton.  IMPRESSION: 1. No findings to suggest interstitial lung disease. 2. 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 Slight progression of disease compared to the prior study. 3. Aortic atherosclerosis, in addition to left anterior descending coronary artery disease. Assessment for potential risk factor modification, dietary therapy or pharmacologic therapy may be warranted, if clinically indicated.   COUGH, CHRONIC Multifactorial with contributions from her bronchiectasis but also upper airway instability.  Allergic rhinitis appears to be contributing to the latter the most.  Continue to treat as above.  She denies any significant breakthrough GERD although we may need to reinstitute treatment for this at some point as well.  Bronchiectasis maintenance as above.  BRONCHIECTASIS Please continue your saline nebulizer treatment and chest vest routine as you have been doing it. Restart your nasal saline rinses 1-2 times daily Keep albuterol nebulizer available to use if needed for phlegm clearance, coughing, shortness of breath Please call our office if you have any change in the amount, color, characteristics of your mucus.  If so we will likely want to send for culture information and then initiate antibiotic treatment. We will determine at your next visit the timing of any repeat imaging, chest x-ray or CT scan Follow with Dr Delton Coombes in 4 months or sooner if  you have any problems.  Allergic rhinitis due to pollen Please continue your Astelin, ipratropium, Nasacort nasal sprays as you have been using them. Follow with Dr Gary Fleet as planned.     Levy Pupa, MD, PhD 06/17/2018, 1:50 PM [facility] [phone] or if no answer [phone]

## 2018-06-17 NOTE — Assessment & Plan Note (Signed)
Multifactorial with contributions from her bronchiectasis but also upper airway instability.  Allergic rhinitis appears to be contributing to the latter the most.  Continue to treat as above.  She denies any significant breakthrough GERD although we may need to reinstitute treatment for this at some point as well.  Bronchiectasis maintenance as above.

## 2018-06-21 ENCOUNTER — Other Ambulatory Visit: Payer: Self-pay | Admitting: Family Medicine

## 2018-07-15 ENCOUNTER — Telehealth: Payer: Self-pay | Admitting: Family Medicine

## 2018-07-15 ENCOUNTER — Other Ambulatory Visit: Payer: Self-pay | Admitting: *Deleted

## 2018-07-15 MED ORDER — NITROFURANTOIN MONOHYD MACRO 100 MG PO CAPS: 100.0000 mg | ORAL_CAPSULE | Freq: Two times a day (BID) | ORAL | 0 refills | Status: AC

## 2018-07-15 NOTE — Telephone Encounter (Signed)
Pt was seen the first of March for UTI and the symptoms are starting back up again. She would like to know if another antibiotic could be called in.   [facility] - [place], [place] - [address]

## 2018-07-15 NOTE — Telephone Encounter (Signed)
Left message to get more info. Pt was seen 06/12/18 and prescribed macrobid and had appt with urologist on march 30th

## 2018-07-15 NOTE — Telephone Encounter (Signed)
Med sent to pharm. Pt notified.  

## 2018-07-15 NOTE — Telephone Encounter (Signed)
We can do another round of macrobid

## 2018-07-15 NOTE — Telephone Encounter (Signed)
Pt states uti cleared up on antibiotics and the burning started back about one week ago. No other symptoms. Urology canceled her appt for march and rescheduled for June.   [facility].

## 2018-09-25 DIAGNOSIS — J0100 Acute maxillary sinusitis, unspecified: Secondary | ICD-10-CM | POA: Diagnosis not present

## 2018-09-25 DIAGNOSIS — J3089 Other allergic rhinitis: Secondary | ICD-10-CM | POA: Diagnosis not present

## 2018-09-25 DIAGNOSIS — J479 Bronchiectasis, uncomplicated: Secondary | ICD-10-CM | POA: Diagnosis not present

## 2018-09-25 DIAGNOSIS — J301 Allergic rhinitis due to pollen: Secondary | ICD-10-CM | POA: Diagnosis not present

## 2018-09-27 ENCOUNTER — Ambulatory Visit (INDEPENDENT_AMBULATORY_CARE_PROVIDER_SITE_OTHER): Payer: Medicare Other | Admitting: Emergency Medicine

## 2018-09-27 ENCOUNTER — Other Ambulatory Visit: Payer: Self-pay

## 2018-09-27 ENCOUNTER — Encounter: Payer: Self-pay | Admitting: Emergency Medicine

## 2018-09-27 ENCOUNTER — Encounter: Payer: Self-pay | Admitting: *Deleted

## 2018-09-27 DIAGNOSIS — J479 Bronchiectasis, uncomplicated: Secondary | ICD-10-CM | POA: Diagnosis not present

## 2018-09-27 DIAGNOSIS — J301 Allergic rhinitis due to pollen: Secondary | ICD-10-CM | POA: Diagnosis not present

## 2018-09-27 MED ORDER — AEROCHAMBER MV MISC: 0 refills | Status: DC

## 2018-09-27 MED ORDER — BUDESONIDE-FORMOTEROL FUMARATE 160-4.5 MCG/ACT IN AERO: 2.0000 | INHALATION_SPRAY | Freq: Two times a day (BID) | RESPIRATORY_TRACT | 0 refills | Status: DC

## 2018-09-27 NOTE — Patient Instructions (Addendum)
We will try starting Symbicort 2 puffs twice a day to see if this helps your breathing. Please rinse and gargle after using. Try using this with a spacer to get better delivery.  Keep albuterol available to use 2 puffs if needed for shortness of breath, spells of coughing. Agree with starting the cefdinir antibiotic as prescribed by Dr. Remus Blake Agree with your allergy regimen to include Xyzal, addition of Allegra, Nasacort nasal spray and scheduled Atrovent (ipratropium) nasal spray. We will hold off on repeating your CT scan of the chest right now.  We will revisit the timing on this at your next visit. Follow with Dr Lamonte Sakai in 1 month or next available to assess your symptoms.

## 2018-09-27 NOTE — Progress Notes (Signed)
Patient ID: [name], female    DOB: June 16, 1945, 73 y.o.   MRN: [id number] HPI  ROV 06/17/2018 --[name] is 36, has a history of chronic cough in the setting of significant bronchiectasis, Mycobacterium avium, Proteus, MRSA colonization.  She also has upper airway irritability and cough that can happen with her allergic rhinitis and GERD.  Her last high-resolution CT was 11/09/2017.  At her last visit we agreed to defer Mycobacterium treatment again.  She uses a chest vest 1-2 times daily.  I sent her back to be reevaluated at allergy, Dr Orvil Feil. She had skin testing, is sensitive to dust and grasses. She was started on astelin, atrovent NS prn, nasacort. She may be coughing a bit less but she does still have a lot of drainage. She clears phlegm effectively. She is currently using NaCl nebs in the am, chest vest in the evening. Occasional GERD, occasional dysphagia.   ROV 09/27/2018 --follow-up visit for patient with a history of bronchiectasis and multifactorial chronic cough with contributions of upper airway irritability, allergic rhinitis, GERD.  She has been treated for an extended period for Mycobacterium avium but unfortunately remains colonized.  Other cultures have revealed Proteus, MRSA in the past.  She uses chest vest saline nebs, and is on an allergy regimen that includes Nasacort, nasal saline rinses. She has been off astelin, atrovent NS's. Saw Dr Orvil Feil > On xyzal, starting atrovent on a schedule. Adding allegra per Dr Seward Meth recs. Also was prescribed cefdinir, hasn't started it yet.  She has started using her hypertonic saline nebs in the am with good effect.   Her last CT chest is 11/09/2017. 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 Her cough burden seems a bit worse, seems to be seasonal. She is having increased dyspnea as well. Her mucous is yellow.     Vitals:   09/27/18 1116  BP: 120/78  Pulse: 74  Temp: 98.2 F (36.8 C)  TempSrc: Oral  SpO2: 94%  Weight:  103 lb 3.2 oz (46.8 kg)  Height: 5' 4.5" (1.638 m)   Gen: Pleasant, Thin woman, in no distress,  normal affect  ENT: No lesions,  mouth clear,  oropharynx clear, no postnasal drip  Neck: No JVD, no stridor  Lungs: No use of accessory muscles, no wheezes, more clear today  Cardiovascular: RRR, heart sounds normal, no murmur or gallops, no peripheral edema  Musculoskeletal: No deformities, no cyanosis or clubbing  Neuro: alert, non focal  Skin: Warm, no lesions or rashes    11/09/2017 CT chest   COMPARISON:  Chest CT 08/30/2016.  FINDINGS: Cardiovascular: Heart size is normal. There is no significant pericardial fluid, thickening or pericardial calcification. There is aortic atherosclerosis, as well as atherosclerosis of the great vessels of the mediastinum and the coronary arteries, including calcified atherosclerotic plaque in the left anterior descending coronary artery.  Mediastinum/Nodes: No pathologically enlarged mediastinal or hilar lymph nodes. Please note that accurate exclusion of hilar adenopathy is limited on noncontrast CT scans. Esophagus is unremarkable in appearance. No axillary lymphadenopathy.  Lungs/Pleura: High-resolution images again demonstrate scattered areas of cylindrical and mild varicose bronchiectasis throughout the lungs bilaterally. The areas of greatest involvement demonstrate extensive thickening of the peribronchovascular interstitium with surrounding septal thickening, peribronchovascular micro and macro nodularity and regional areas of architectural distortion. No significant regions of ground-glass attenuation. No honeycombing. Inspiratory and expiratory imaging is unremarkable. No confluent consolidative airspace disease. No pleural effusions.  Upper Abdomen: Aortic atherosclerosis.  Musculoskeletal: There are  no aggressive appearing lytic or blastic lesions noted in the visualized portions of the skeleton.  IMPRESSION: 1.  No findings to suggest interstitial lung disease. 2. 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 Slight progression of disease compared to the prior study. 3. Aortic atherosclerosis, in addition to left anterior descending coronary artery disease. Assessment for potential risk factor modification, dietary therapy or pharmacologic therapy may be warranted, if clinically indicated.   BRONCHIECTASIS Stable mucous burden, possibly a bit more purulent lately. Increased dyspnea. She has been on BD's before but not recently. ? Whether she will benefit, have improvement in dyspnea.   We will try starting Symbicort 2 puffs twice a day to see if this helps your breathing. Please rinse and gargle after using. Try using this with a spacer to get better delivery.  Keep albuterol available to use 2 puffs if needed for shortness of breath, spells of coughing. Agree with starting the cefdinir antibiotic as prescribed by Dr. Gary FleetWhalen. We will hold off on repeating your CT scan of the chest right now.  We will revisit the timing on this at your next visit. Follow with Dr Delton CoombesByrum in 1 month or next available to assess your symptoms  Allergic rhinitis due to pollen Agree with your allergy regimen to include Xyzal, addition of Allegra, Nasacort nasal spray and scheduled Atrovent (ipratropium) nasal spray    Mary Pupaobert Jasai Sorg, MD, PhD 09/27/2018, 11:58 AM [facility] [phone] or if no answer [phone]

## 2018-09-27 NOTE — Assessment & Plan Note (Signed)
Agree with your allergy regimen to include Xyzal, addition of Allegra, Nasacort nasal spray and scheduled Atrovent (ipratropium) nasal spray

## 2018-09-27 NOTE — Assessment & Plan Note (Signed)
Stable mucous burden, possibly a bit more purulent lately. Increased dyspnea. She has been on BD's before but not recently. ? Whether she will benefit, have improvement in dyspnea.   We will try starting Symbicort 2 puffs twice a day to see if this helps your breathing. Please rinse and gargle after using. Try using this with a spacer to get better delivery.  Keep albuterol available to use 2 puffs if needed for shortness of breath, spells of coughing. Agree with starting the cefdinir antibiotic as prescribed by Dr. Remus Blake. We will hold off on repeating your CT scan of the chest right now.  We will revisit the timing on this at your next visit. Follow with Dr Lamonte Sakai in 1 month or next available to assess your symptoms

## 2018-09-30 DIAGNOSIS — N952 Postmenopausal atrophic vaginitis: Secondary | ICD-10-CM | POA: Diagnosis not present

## 2018-09-30 DIAGNOSIS — Z87440 Personal history of urinary (tract) infections: Secondary | ICD-10-CM | POA: Diagnosis not present

## 2018-09-30 DIAGNOSIS — K5900 Constipation, unspecified: Secondary | ICD-10-CM | POA: Diagnosis not present

## 2018-09-30 DIAGNOSIS — R197 Diarrhea, unspecified: Secondary | ICD-10-CM | POA: Diagnosis not present

## 2018-10-24 ENCOUNTER — Other Ambulatory Visit: Payer: Self-pay | Admitting: Family Medicine

## 2018-11-04 ENCOUNTER — Other Ambulatory Visit: Payer: Self-pay

## 2018-11-04 ENCOUNTER — Encounter: Payer: Self-pay | Admitting: Emergency Medicine

## 2018-11-04 ENCOUNTER — Ambulatory Visit (INDEPENDENT_AMBULATORY_CARE_PROVIDER_SITE_OTHER): Payer: Medicare Other | Admitting: Emergency Medicine

## 2018-11-04 DIAGNOSIS — R05 Cough: Secondary | ICD-10-CM

## 2018-11-04 DIAGNOSIS — R059 Cough, unspecified: Secondary | ICD-10-CM

## 2018-11-04 MED ORDER — BUDESONIDE-FORMOTEROL FUMARATE 160-4.5 MCG/ACT IN AERO: 2.0000 | INHALATION_SPRAY | Freq: Two times a day (BID) | RESPIRATORY_TRACT | 0 refills | Status: DC

## 2018-11-04 NOTE — Assessment & Plan Note (Signed)
Please continue your Xyzal, Allegra, Nasacort nasal spray, Atrovent nasal spray as you have been taking them. Follow with Dr Lamonte Sakai in 4 months or sooner if you have any problems.

## 2018-11-04 NOTE — Assessment & Plan Note (Signed)
Multifactorial. Fairly well controlled currently.

## 2018-11-04 NOTE — Assessment & Plan Note (Signed)
We will try restarting Symbicort 2 puffs twice daily to see if you continue to get benefit.  Please call us and let us know to the we can order it through your pharmacy if we decide to continue it. Continue your chest vest as you have been doing it. Get the flu shot in the Fall

## 2018-11-04 NOTE — Assessment & Plan Note (Signed)
Persisted despite rx. No plan to retreat for now.

## 2018-11-04 NOTE — Patient Instructions (Addendum)
Please continue your Xyzal, Allegra, Nasacort nasal spray, Atrovent nasal spray as you have been taking them. We will try restarting Symbicort 2 puffs twice daily to see if you continue to get benefit.  Please call us and let us know to the we can order it through your pharmacy if we decide to continue it. Continue your chest vest as you have been doing it. Get the flu shot in the Fall Follow with Dr Lamonte Sakai in 4 months or sooner if you have any problems.

## 2018-11-04 NOTE — Progress Notes (Signed)
Patient ID: Mary [name], female    DOB: February 11, 1946, 73 y.o.   MRN: [id number] HPI  ROV 06/17/2018 --Mary [name] is 73, has a history of chronic cough in the setting of significant bronchiectasis, Mycobacterium avium, Proteus, MRSA colonization.  She also has upper airway irritability and cough that can happen with her allergic rhinitis and GERD.  Her last high-resolution CT was 11/09/2017.  At her last visit we agreed to defer Mycobacterium treatment again.  She uses a chest vest 1-2 times daily.  I sent her back to be reevaluated at allergy, Dr Orvil Feil. She had skin testing, is sensitive to dust and grasses. She was started on astelin, atrovent NS prn, nasacort. She may be coughing a bit less but she does still have a lot of drainage. She clears phlegm effectively. She is currently using NaCl nebs in the am, chest vest in the evening. Occasional GERD, occasional dysphagia.   ROV 09/27/2018 --follow-up visit for patient with a history of bronchiectasis and multifactorial chronic cough with contributions of upper airway irritability, allergic rhinitis, GERD.  She has been treated for an extended period for Mycobacterium avium but unfortunately remains colonized.  Other cultures have revealed Proteus, MRSA in the past.  She uses chest vest saline nebs, and is on an allergy regimen that includes Nasacort, nasal saline rinses. She has been off astelin, atrovent NS's. Saw Dr Orvil Feil > On xyzal, starting atrovent on a schedule. Adding allegra per Dr Seward Meth recs. Also was prescribed cefdinir, hasn't started it yet.  She has started using her hypertonic saline nebs in the am with good effect.   Her last CT chest is 11/09/2017. 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 Her cough burden seems a bit worse, seems to be seasonal. She is having increased dyspnea as well. Her mucous is yellow.   ROV 11/04/2018 --Mary [name] has bronchiectasis and multifactorial chronic cough with contributions of upper airway  irritability, allergic rhinitis, GERD.  She is colonized with Mycobacterium avium (persisted after prior treatment).  Also has cultures positive in the past for MRSA, Proteus.  At her last visit she was having a slight increase in her purulent mucus, started on cefdinir by Dr. Remus Blake.  Continued Zyzal in addition to Allegra, Nasacort nasal spray, Atrovent nasal spray.  She was also having increased dyspnea so I did a therapeutic trial of Symbicort.  She reports today that she took the symbicort. She feels better  - benefited from the changes that were made last time. She had a significant decrease in her mucous burden for several weeks.  She does the chest vest in the evenings. Currently she is having some throat drainage.     Vitals:   11/04/18 1336  BP: 118/70  Pulse: 84  SpO2: 96%  Weight: 103 lb (46.7 kg)  Height: 5' 4.5" (1.638 m)   Gen: Pleasant, Thin woman, in no distress,  normal affect  ENT: No lesions,  mouth clear,  oropharynx clear, no postnasal drip  Neck: No JVD, no stridor  Lungs: No use of accessory muscles, no wheezes, more clear today  Cardiovascular: RRR, heart sounds normal, no murmur or gallops, no peripheral edema  Musculoskeletal: No deformities, no cyanosis or clubbing  Neuro: alert, non focal  Skin: Warm, no lesions or rashes    11/09/2017 CT chest   COMPARISON:  Chest CT 08/30/2016.  FINDINGS: Cardiovascular: Heart size is normal. There is no significant pericardial fluid, thickening or pericardial calcification. There is aortic atherosclerosis, as well  as atherosclerosis of the great vessels of the mediastinum and the coronary arteries, including calcified atherosclerotic plaque in the left anterior descending coronary artery.  Mediastinum/Nodes: No pathologically enlarged mediastinal or hilar lymph nodes. Please note that accurate exclusion of hilar adenopathy is limited on noncontrast CT scans. Esophagus is unremarkable in appearance. No  axillary lymphadenopathy.  Lungs/Pleura: High-resolution images again demonstrate scattered areas of cylindrical and mild varicose bronchiectasis throughout the lungs bilaterally. The areas of greatest involvement demonstrate extensive thickening of the peribronchovascular interstitium with surrounding septal thickening, peribronchovascular micro and macro nodularity and regional areas of architectural distortion. No significant regions of ground-glass attenuation. No honeycombing. Inspiratory and expiratory imaging is unremarkable. No confluent consolidative airspace disease. No pleural effusions.  Upper Abdomen: Aortic atherosclerosis.  Musculoskeletal: There are no aggressive appearing lytic or blastic lesions noted in the visualized portions of the skeleton.  IMPRESSION: 1. No findings to suggest interstitial lung disease. 2. 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 Slight progression of disease compared to the prior study. 3. Aortic atherosclerosis, in addition to left anterior descending coronary artery disease. Assessment for potential risk factor modification, dietary therapy or pharmacologic therapy may be warranted, if clinically indicated.   Allergic rhinitis due to pollen Please continue your Xyzal, Allegra, Nasacort nasal spray, Atrovent nasal spray as you have been taking them. Follow with Dr Lamonte Sakai in 4 months or sooner if you have any problems.  BRONCHIECTASIS We will try restarting Symbicort 2 puffs twice daily to see if you continue to get benefit.  Please call us and let us know to the we can order it through your pharmacy if we decide to continue it. Continue your chest vest as you have been doing it. 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 No plan to retreat for now.   COUGH,  CHRONIC Multifactorial. Fairly well controlled currently.     Baltazar Apo, MD, PhD 11/04/2018, 2:02 PM [facility] [phone] or if no answer [phone]

## 2018-11-05 LAB — FUNGUS CULTURE WITH STAIN

## 2018-12-13 DIAGNOSIS — N390 Urinary tract infection, site not specified: Secondary | ICD-10-CM | POA: Diagnosis not present

## 2018-12-18 ENCOUNTER — Telehealth: Payer: Self-pay | Admitting: Emergency Medicine

## 2018-12-18 MED ORDER — LEVOFLOXACIN 500 MG PO TABS: 500.0000 mg | ORAL_TABLET | Freq: Every day | ORAL | 0 refills | Status: DC

## 2018-12-18 NOTE — Telephone Encounter (Signed)
Plan levaquin 500mg  qd x 10 days

## 2018-12-18 NOTE — Telephone Encounter (Signed)
Spoke with the pt and notified of recs per Dr Lamonte Sakai  She verbalized understanding and rx was sent to [facility]

## 2018-12-18 NOTE — Telephone Encounter (Signed)
Spoke with the pt  She is c/o increased cough x 2 days- hard to produce sputum but when she does it is yellow to brown  She is having minimal increase in SOB and wheezing some She checked temp last night and it was 99 and she "feels lousy"  She denies any chest tightness or pain, loss of taste or smell, nausea, vomiting, diarrhea or other co's  No recent travel or sick contacts  She has not been taking the symbicort bc she saw no benefit  She is using her chest vest daily  She is using her atrovent NS, xyzal, allegra, nasacort  She requests abx  Declined televisit or video visit  Please advise thanks! Allergies  Allergen Reactions  . Rifampin   . Sulfamethoxazole-Trimethoprim     REACTION: Rash  . Doxycycline Rash    Splotches on skin

## 2018-12-27 ENCOUNTER — Telehealth: Payer: Self-pay | Admitting: Emergency Medicine

## 2018-12-27 MED ORDER — CLARITHROMYCIN 500 MG PO TABS: 500.0000 mg | ORAL_TABLET | Freq: Two times a day (BID) | ORAL | 0 refills | Status: AC

## 2018-12-27 NOTE — Telephone Encounter (Signed)
Call returned to patient, she states she called a couple of weeks ago and RB gave her Levaquin. She reports she has  taken levaquin before and it made her legs weak so she opted to not take the Levaquin. Now she reports currently she is having increased cough, increased SOB, chest heaviness. She reports thick discolored mucous. She states she is worse off than she was before. I explained I would get this message to RB and get back with her for recommendations. Voiced understanding.  RB please advise. Thanks.

## 2018-12-27 NOTE — Telephone Encounter (Signed)
Please have her take clarithromycin 500mg  bid x 10 days.

## 2018-12-27 NOTE — Telephone Encounter (Signed)
Called and spoke to patient. Gave her Dr. Sudie Bailey recommendation. Patient stated that would be great and wanted Rx send to [facility].  Rx sent to preferred pharmacy.  Nothing further needed at this time.

## 2019-01-06 DIAGNOSIS — R197 Diarrhea, unspecified: Secondary | ICD-10-CM | POA: Diagnosis not present

## 2019-01-06 DIAGNOSIS — K5900 Constipation, unspecified: Secondary | ICD-10-CM | POA: Diagnosis not present

## 2019-01-06 DIAGNOSIS — N390 Urinary tract infection, site not specified: Secondary | ICD-10-CM | POA: Diagnosis not present

## 2019-01-06 DIAGNOSIS — R351 Nocturia: Secondary | ICD-10-CM | POA: Diagnosis not present

## 2019-01-20 DIAGNOSIS — K5900 Constipation, unspecified: Secondary | ICD-10-CM | POA: Diagnosis not present

## 2019-01-20 DIAGNOSIS — R351 Nocturia: Secondary | ICD-10-CM | POA: Diagnosis not present

## 2019-01-20 DIAGNOSIS — N949 Unspecified condition associated with female genital organs and menstrual cycle: Secondary | ICD-10-CM | POA: Diagnosis not present

## 2019-01-20 DIAGNOSIS — Z87440 Personal history of urinary (tract) infections: Secondary | ICD-10-CM | POA: Diagnosis not present

## 2019-01-31 ENCOUNTER — Other Ambulatory Visit: Payer: Self-pay | Admitting: Family Medicine

## 2019-02-03 DIAGNOSIS — Z23 Encounter for immunization: Secondary | ICD-10-CM | POA: Diagnosis not present

## 2019-02-10 ENCOUNTER — Ambulatory Visit (INDEPENDENT_AMBULATORY_CARE_PROVIDER_SITE_OTHER): Payer: Medicare Other

## 2019-02-10 ENCOUNTER — Other Ambulatory Visit: Payer: Self-pay

## 2019-02-10 ENCOUNTER — Encounter: Payer: Self-pay | Admitting: Pulmonary Disease

## 2019-02-10 ENCOUNTER — Ambulatory Visit (INDEPENDENT_AMBULATORY_CARE_PROVIDER_SITE_OTHER): Payer: Medicare Other | Admitting: Pulmonary Disease

## 2019-02-10 ENCOUNTER — Telehealth: Payer: Self-pay | Admitting: Emergency Medicine

## 2019-02-10 VITALS — BP 124/62 | HR 84 | Temp 97.8°F | Ht 64.5 in | Wt 112.0 lb

## 2019-02-10 DIAGNOSIS — A310 Pulmonary mycobacterial infection: Secondary | ICD-10-CM | POA: Diagnosis not present

## 2019-02-10 DIAGNOSIS — J301 Allergic rhinitis due to pollen: Secondary | ICD-10-CM | POA: Diagnosis not present

## 2019-02-10 DIAGNOSIS — R05 Cough: Secondary | ICD-10-CM | POA: Diagnosis not present

## 2019-02-10 DIAGNOSIS — J479 Bronchiectasis, uncomplicated: Secondary | ICD-10-CM

## 2019-02-10 NOTE — Addendum Note (Signed)
Addended by: Valerie Salts on: 02/10/2019 04:46 PM   Modules accepted: Orders

## 2019-02-10 NOTE — Progress Notes (Signed)
_0  ID: Mary [name], female    DOB: [birth year], 73 y.o.   MRN: [id number]  Chief Complaint  Patient presents with  . Acute Visit    Increased SOB and cough. Productive cough with greenish, yellowish phlegm. Was on an abx last month and did not help.     Referring provider: Mikey Kirschner, MD  HPI:  73 year old female never smoker followed in our office for bronchiectasis, history of MAI  PMH: Osteoporosis, dysphagia, fatigue Smoker/ Smoking History: Never smoker Maintenance: None Pt of: Mary [name]  02/10/2019  - Visit   73 year old female never smoker followed in our office for bronchiectasis as well as a history of MAI.  Patient contacted our office in September/2020 reporting worsening symptoms as well as a productive cough with discolored mucus.  Patient was originally prescribed Levaquin.  Although documentation shows that patient did take this medication she is reporting today that she never took the [place].  She called back on 12/27/2018 to report the symptoms still had not improved.  This is when she was prescribed clarithromycin for 10 days.  She did complete these antibiotics and did feel that she was getting somewhat better while she was taking them.  Symptoms returned shortly after completing the antibiotic course.  Patient continues to have a significantly productive cough, increased sputum production from her baseline, yellow to green mucus with occasionally blood streaks in it.  She continues to use her hypertonic saline daily in the morning as well as her therapy vest at night.  She is unsuccessful with use of a flutter valve.  Patient's last sputum culture was from 2019 that did show MAI as well as Proteus Mirabellus and respiratory sputum.  Patient presents her office today because she felt that she needed a chest x-ray and symptoms were not improving.  Tests:   10/29/2017-Mycobacterium avium intracellular and group 10/29/2017-respiratory sputum-Proteus  Mirabella's, pansensitive, intermediate resistance to imipenem   11/09/2017-CT chest high-res-no findings to suggest interstitial lung disease, findings are again most consistent with chronic indolent atypical infectious process such as MAI, slight progression of disease compared to prior study, aortic arthrosclerosis  FENO:  No results found for: NITRICOXIDE  PFT: No flowsheet data found.  WALK:  No flowsheet data found.  Imaging: No results found.  Lab Results:  CBC    Component Value Date/Time   WBC 10.0 09/28/2016 1715   RBC 4.91 09/28/2016 1715   HGB 14.8 09/28/2016 1715   HCT 44.1 09/28/2016 1715   PLT 250.0 09/28/2016 1715   MCV 89.8 09/28/2016 1715   MCH 29.3 08/05/2013 0411   MCHC 33.5 09/28/2016 1715   RDW 12.9 09/28/2016 1715   LYMPHSABS 1.5 09/28/2016 1715   MONOABS 0.7 09/28/2016 1715   EOSABS 0.1 09/28/2016 1715   BASOSABS 0.0 09/28/2016 1715    BMET    Component Value Date/Time   NA 140 09/28/2016 1715   NA 140 09/03/2015 0841   K 4.6 09/28/2016 1715   CL 101 09/28/2016 1715   CO2 29 09/28/2016 1715   GLUCOSE 166 (H) 02/20/2018 1407   GLUCOSE 123 (H) 09/28/2016 1715   BUN 14 09/28/2016 1715   BUN 12 09/03/2015 0841   CREATININE 0.73 09/28/2016 1715   CREATININE 0.72 02/04/2014 0830   CALCIUM 9.3 09/28/2016 1715   GFRNONAA 95 09/03/2015 0841   GFRAA 110 09/03/2015 0841    BNP No results found for: BNP  ProBNP No results found for: PROBNP  Specialty Problems  Pulmonary Problems   BRONCHIECTASIS    Qualifier: Diagnosis of  By: Mary Emery RN, Mary [name], CHRONIC    Qualifier: Diagnosis of  By: Mary Emery RN, Mary [name]        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 Fungal antibody profile- negative Allergy vaccine start 1:50,000 04/07/10; 1:50 07/22/10, Advance to 1:10 after 01/20/11 [place]       Zenker's diverticulum       Allergies  Allergen Reactions  . Rifampin   . Sulfamethoxazole-Trimethoprim     REACTION: Rash  . Doxycycline Rash    Splotches on skin    Immunization History  Administered Date(s) Administered  . Influenza Split 01/24/2012  . Influenza Whole 01/01/2009, 01/01/2010, 01/02/2011  . Influenza, High Dose Seasonal PF 01/21/2017  . Influenza,inj,Quad PF,6+ Mos 01/26/2014, 02/10/2015, 02/02/2016  . Influenza,inj,quad, With Preservative 12/21/2017  . Influenza-Unspecified 01/06/2013, 02/08/2015, 01/21/2017  . Pneumococcal Conjugate-13 01/26/2014  . Pneumococcal Polysaccharide-23 04/04/2003, 10/25/2015    Past Medical History:  Diagnosis Date  . Allergic rhinitis    using Nasocort daily and Allegra daily  . Arthritis    "hands" (08/01/2013)  . Bronchiectasis    uses Albuterol daily as needed but has been a while since used(more than a month ago),  . Chronic cough   . COPD (chronic obstructive pulmonary disease) ([place])   . Diverticulosis   . GERD (gastroesophageal reflux disease)    takes Prilosec daily  . History of bladder infections    takes Keflex daily;MaryOttin is Dealer  . History of MRSA infection    several yrs ago  . Joint pain   . Mycobacterium avium complex ([place])   . Osteoporosis   . Osteoporosis    takes Fosamax weekly  . Pneumonia 1990's  . PONV (postoperative nausea and vomiting)   . Zenker's diverticulum     Tobacco History: Social History   Tobacco Use  Smoking Status Never Smoker  Smokeless Tobacco Never Used   Counseling given: Yes   Continue to not smoke  Outpatient Encounter Medications as of 02/10/2019  Medication Sig  . albuterol (PROVENTIL) (2.5 MG/3ML) 0.083% nebulizer solution Take 3 mLs (2.5 mg total) by nebulization every 6 (six) hours as needed for wheezing or shortness of breath.  Marland Kitchen alendronate (FOSAMAX) 70 MG tablet TAKE 1 TABLET BY MOUTH ONCE EVERY WEEK AS DIRECTED  . aspirin EC 81 MG tablet Take 81 mg by mouth daily.  Marland Kitchen  conjugated estrogens (PREMARIN) vaginal cream Premarin 0.625 mg/gram vaginal cream three times week  . ipratropium (ATROVENT) 0.03 % nasal spray Place 2 sprays into both nostrils 2 (two) times daily.  Marland Kitchen levocetirizine (XYZAL) 5 MG tablet TAKE 1 TABLET BY MOUTH EVERY EVENING  . sodium chloride HYPERTONIC 3 % nebulizer solution INHALE 3ML VIA NEBULIZER TWICE DAILY  . Spacer/Aero-Holding Chambers (AEROCHAMBER MV) inhaler Use as instructed  . [DISCONTINUED] budesonide-formoterol (SYMBICORT) 160-4.5 MCG/ACT inhaler Inhale 2 puffs into the lungs 2 (two) times daily.  . [DISCONTINUED] levofloxacin (LEVAQUIN) 500 MG tablet Take 1 tablet (500 mg total) by mouth daily.   No facility-administered encounter medications on file as of 02/10/2019.      Review of Systems  Review of Systems  Constitutional: Positive for fatigue. Negative for activity change and fever.  HENT: Positive for congestion and postnasal drip. Negative for sinus pressure, sinus pain and  sore throat.   Respiratory: Positive for cough and shortness of breath. Negative for wheezing.   Cardiovascular: Negative for chest pain, palpitations and leg swelling.  Gastrointestinal: Negative for diarrhea, nausea and vomiting.  Musculoskeletal: Negative for arthralgias.  Neurological: Negative for dizziness.  Psychiatric/Behavioral: Negative for sleep disturbance. The patient is not nervous/anxious.      Physical Exam  BP 124/62   Pulse 84   Temp 97.8 F (36.6 C) (Temporal)   Ht 5' 4.5" (1.638 m)   Wt 112 lb (50.8 kg)   SpO2 95%   BMI 18.93 kg/m   Wt Readings from Last 5 Encounters:  02/10/19 112 lb (50.8 kg)  11/04/18 103 lb (46.7 kg)  09/27/18 103 lb 3.2 oz (46.8 kg)  06/17/18 105 lb (47.6 kg)  06/12/18 103 lb (46.7 kg)    BMI Readings from Last 5 Encounters:  02/10/19 18.93 kg/m  11/04/18 17.41 kg/m  09/27/18 17.44 kg/m  06/17/18 17.74 kg/m  06/12/18 17.41 kg/m     Physical Exam Vitals signs and nursing note  reviewed.  Constitutional:      General: She is not in acute distress.    Appearance: Normal appearance. She is normal weight.     Comments: Thin frail elderly female  HENT:     Head: Normocephalic and atraumatic.     Right Ear: Tympanic membrane, ear canal and external ear normal. There is no impacted cerumen.     Left Ear: Tympanic membrane, ear canal and external ear normal. There is no impacted cerumen.     Nose: Congestion and rhinorrhea present.     Mouth/Throat:     Mouth: Mucous membranes are moist.     Pharynx: Oropharynx is clear.     Comments: Postnasal drip Eyes:     Pupils: Pupils are equal, round, and reactive to light.  Neck:     Musculoskeletal: Normal range of motion.  Cardiovascular:     Rate and Rhythm: Normal rate and regular rhythm.     Pulses: Normal pulses.     Heart sounds: Normal heart sounds. No murmur.  Pulmonary:     Effort: Pulmonary effort is normal. No respiratory distress.     Breath sounds: No decreased air movement. Rhonchi (Throughout entire exam) present. No decreased breath sounds, wheezing or rales.     Comments: Noisy chest, scattered squeaks Skin:    General: Skin is warm and dry.     Capillary Refill: Capillary refill takes less than 2 seconds.  Neurological:     General: No focal deficit present.     Mental Status: She is alert and oriented to person, place, and time. Mental status is at baseline.     Gait: Gait normal.  Psychiatric:        Mood and Affect: Mood normal. Affect is flat.        Behavior: Behavior normal.        Thought Content: Thought content normal.        Judgment: Judgment normal.       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   Lauraine Rinne, NP 02/10/2019   This appointment was 28 minutes long with over 50% of the time in direct face-to-face patient care, assessment, plan of care, and follow-up.

## 2019-02-10 NOTE — Assessment & Plan Note (Signed)
Concern today that MAI may have worsened  Plan: Chest x-ray today Sputum today: Fungal, AFB, respiratory sputum

## 2019-02-10 NOTE — Assessment & Plan Note (Addendum)
Plan: Chest x-ray today Sputum culture today, AFB, fungal, respiratory sputum Continue hypertonic saline twice daily Continue therapy vest  We will need to consider antibiotic treatment after completion of chest x-ray

## 2019-02-10 NOTE — Patient Instructions (Addendum)
You were seen today by Lauraine Rinne, NP  for:   1. BRONCHIECTASIS  Chest x-ray today  Sputum culture today: AFB, fungal, respiratory sputum  Continue hypertonic saline nebs twice daily  Continue therapy vest  2. 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 Non-seasonal allergic rhinitis due to pollen  Continue Xyzal daily  Continue Atrovent nasal spray  Can add Flonase 1 spray each nostril daily as needed for nasal congestion allergy-like symptoms  Can also do nasal saline rinses   Follow Up:    Return in about 4 weeks (around 03/10/2019), or if symptoms worsen or fail to improve, for Follow up with Dr. Lamonte Sakai.   Please do your part to reduce the spread of COVID-19:      Reduce your risk of any infection  and COVID19 by using the similar precautions used for avoiding the common cold or flu:  Marland Kitchen Wash your hands often with soap and warm water for at least 20 seconds.  If soap and water are not readily available, use an alcohol-based hand sanitizer with at least 60% alcohol.  . If coughing or sneezing, cover your mouth and nose by coughing or sneezing into the elbow areas of your shirt or coat, into a tissue or into your sleeve (not your hands). Langley Gauss A MASK when in public  . Avoid shaking hands with others and consider head nods or verbal greetings only. . Avoid touching your eyes, nose, or mouth with unwashed hands.  . Avoid close contact with people who are sick. . Avoid places or events with large numbers of people in one location, like concerts or sporting events. . If you have some symptoms but not all symptoms, continue to monitor at home and seek medical attention if your symptoms worsen. . If you are having a medical emergency, call 911.   [place] / e-Visit: [email or web address]          [facility]: [facility]: [phone]                   [facility]: [phone]     It is flu season:   >>> Best ways to protect herself from the flu: Receive the yearly flu vaccine, practice good hand hygiene washing with soap and also using hand sanitizer when available, eat a nutritious meals, get adequate rest, hydrate appropriately   Please contact the office if your symptoms worsen or you have concerns that you are not improving.   Thank you for choosing [facility] for your healthcare, and for allowing [place] to partner with you on your healthcare journey. I am thankful to be able to provide care to you today.   Wyn Quaker FNP-C

## 2019-02-10 NOTE — Telephone Encounter (Signed)
Call returned to patient, confirmed DOB, she states back in september she started having a bad cough with thick mucous. She was given an antibiotic back in September (09/16) and she is requesting an antibiotic now because she feels like it worked for a short period but all her symptoms have returned. She reports she does not feel like she is running a fever. She was prescribed Levaquin. She was later given clarithromycin in place of the Levaquin due to the neuropathy. She reports having head congestion that has moved to her chest. She states she does not feel like she is running a fever. She states RB usually just calls her in something. I made her aware if she can check her temperature being that she denies all other covid questions she can be seen in office and have the CXR she is requesting. Checked temperature while on phone. Temp 98.7. Appt made. Nothing further needed at this time.

## 2019-02-10 NOTE — Assessment & Plan Note (Signed)
Plan: Continue Xyzal Continue Allegra Continue Nasacort nasal spray or Flonase Continue Atrovent nasal spray

## 2019-02-11 DIAGNOSIS — R399 Unspecified symptoms and signs involving the genitourinary system: Secondary | ICD-10-CM | POA: Diagnosis not present

## 2019-02-11 MED ORDER — LEVOFLOXACIN 500 MG PO TABS: 500.0000 mg | ORAL_TABLET | Freq: Every day | ORAL | 0 refills | Status: DC

## 2019-02-11 NOTE — Progress Notes (Unsigned)
02/11/2019 1304  Patient's chest x-ray stable.  Due to patient's increased mucus production, continued discolored mucus, recent treatment of clarithromycin for 10 days and persisting symptoms.  We will send in prescription for Levaquin.  This will be for 10 days.  Please remind patient she should be using hypertonic saline twice daily.  Patient should be using her therapy vest at least 1 time a day after using the hypertonic saline.  Although she is not as successful with using a flutter valve I would recommend still using a flutter valve the other time use hypertonic saline to help with mucus production.  Continue forward with planned sputum culture as discussed in office visit 02/10/2019.  Wyn Quaker FNP

## 2019-02-11 NOTE — Progress Notes (Signed)
02/11/2019 1304  Patient's chest x-ray stable.  Due to patient's increased mucus production, continued discolored mucus, recent treatment of clarithromycin for 10 days and persisting symptoms.  We will send in prescription for Levaquin.  This will be for 10 days.  Please remind patient she should be using hypertonic saline twice daily.  Patient should be using her therapy vest at least 1 time a day after using the hypertonic saline.  Although she is not as successful with using a flutter valve I would recommend still using a flutter valve the other time use hypertonic saline to help with mucus production.  Continue forward with planned sputum culture as discussed in office visit 02/10/2019.  Brian Mack FNP 

## 2019-02-12 ENCOUNTER — Other Ambulatory Visit (HOSPITAL_COMMUNITY)
Admission: RE | Admit: 2019-02-12 | Discharge: 2019-02-12 | Disposition: A | Discharge status: Home / Self Care | Payer: Medicare Other | Source: Ambulatory Visit | Attending: Pulmonary Disease | Admitting: Pulmonary Disease

## 2019-02-12 DIAGNOSIS — A310 Pulmonary mycobacterial infection: Secondary | ICD-10-CM | POA: Insufficient documentation

## 2019-02-14 ENCOUNTER — Telehealth: Payer: Self-pay | Admitting: Emergency Medicine

## 2019-02-14 LAB — CULTURE, RESPIRATORY W GRAM STAIN
Culture: NORMAL
Gram Stain: NONE SEEN

## 2019-02-14 MED ORDER — AMOXICILLIN-POT CLAVULANATE 875-125 MG PO TABS: 1.0000 | ORAL_TABLET | Freq: Two times a day (BID) | ORAL | 0 refills | Status: DC

## 2019-02-14 NOTE — Telephone Encounter (Signed)
Unsure why this was routed to [facility].  If patient would like to speak with Dr. Lamonte Sakai she can wait for him to return to office.  We can route the message to Dr. Lamonte Sakai and I will let him know the messages being sent there.Ultimately the patient needs understand that this is not a simple conversation for [place] to manage over the phone.  She needs a physical evaluation.    I am sorry that she disagrees with our clinical work-up as well as our management as we are just trying to do the safest thing for her.  I will route to Dr. Lamonte Sakai as Juluis Rainier and to see if he is anything else he would suggest.  Wyn Quaker FNP

## 2019-02-14 NOTE — Telephone Encounter (Signed)
Spoke with the pt to verify her pharm and I have made her aware that I have added the levaquin to her list of allergies  Augmentin was sent  Nothing further needed

## 2019-02-14 NOTE — Telephone Encounter (Signed)
Sorry will need to refer back to [facility] NP as I have not seen this patient and Dr. Lamonte Sakai  Is not in office

## 2019-02-14 NOTE — Telephone Encounter (Signed)
Called and spoke with Patient.  Aaron Edelman, NP's recommendations given. Patient stated she wanted to speak with Dr. Lamonte Sakai, explained Dr. Lamonte Sakai is not in the office today. Patient does not agree with Aaron Edelman, NP recommendations.  Patient stated she has not swelling, no throat closing, and sob is her normal. OV, telephone, and my chart visit offered.  Patient refused.  Recommended her PCP.  Patient stated her PCP would not do anything, because this was pulmonary, and he did not prescribe Levaquin. Patient is very upset and  requested another provider make recommendations.   Message routed to [facility], NP to advise

## 2019-02-14 NOTE — Telephone Encounter (Signed)
Spoke with the pt and notified of the below per Aaron Edelman  She verbalized understanding  She states that she is wanting something else called in for her today asap  Will forward to Dr Lamonte Sakai, thanks! Allergies  Allergen Reactions  . Rifampin   . Sulfamethoxazole-Trimethoprim     REACTION: Rash  . Doxycycline Rash    Splotches on skin

## 2019-02-14 NOTE — Telephone Encounter (Signed)
Spoke with pt and reviewed her symptom.  The side effect with levaquin is unusual, but it certainly seems that she cannot tolerate. Probably should put it on her allergy list.   Would send in Augmentin 850mg  bid x 7 days for her.

## 2019-02-14 NOTE — Telephone Encounter (Signed)
This does not sound like a reaction to Levaquin.  Does she have any issues with her breathing?  Feel like her throat was closing up?  Or is it simply just hip pain.  Typically if there are issues with fluoroquinolone such as Levaquin and it would be more around her Achilles not hip.   If the patient has further questions she can be evaluated in office or she can be evaluated by her primary care doctor.  I do not agree with switching antibiotics or placing the patient on another antibiotic.  Patient does retain the right to stop taking Levaquin completely and that can be further discussed at next office visit in December/2020 with Dr. Lamonte Sakai.  We can also await pending sputum culture results.  Patient has already had 10 days of clarithromycin.  Patient with history of bronchiectasis.  Patient also with history of MAI.  Patient also with allergies to doxycycline as well as Bactrim.  Wyn Quaker, FNP

## 2019-02-14 NOTE — Progress Notes (Signed)
Pt aware of results 

## 2019-02-14 NOTE — Telephone Encounter (Signed)
Primary Pulmonologist: Dr. Lamonte Sakai Last office visit and with whom: 11/9/20Aaron Edelman, NP What do we see them for (pulmonary problems): bronchiectasis Last OV assessment/plan:        Instructions    Return in about 4 weeks (around 03/10/2019), or if symptoms worsen or fail to improve, for Follow up with Dr. Lamonte Sakai. You were seen today by Lauraine Rinne, NP  for:   1. BRONCHIECTASIS  Chest x-ray today  Sputum culture today: AFB, fungal, respiratory sputum  Continue hypertonic saline nebs twice daily  Continue therapy vest  2. 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 Non-seasonal allergic rhinitis due to pollen  Continue Xyzal daily  Continue Atrovent nasal spray  Can add Flonase 1 spray each nostril daily as needed for nasal congestion allergy-like symptoms  Can also do nasal saline rinses   Follow Up:    Return in about 4 weeks (around 03/10/2019), or if symptoms worsen or fail to improve, for Follow up with Dr. Lamonte Sakai.       Reason for call:  Called and spoke with Patient. Patient stated she was prescribed Levaquin 11/10 by Aaron Edelman, NP for her productive cough, mucus. Patient stated she took the second dose 02/13/19 after she ate supper. Patient stated 3-4 hours later, it was hard to move her hip, and was unable to sleep last night, because she was scared she was having a reaction to Levaquin. Patient hips are ok this morning.  Patient stated she is scared to continue taking Levaquin, because she is afraid it will affect her legs, hips again. Patient is requesting another antibiotic.  Message routed to Aaron Edelman, NP   Patient is requesting another antibiotic.

## 2019-02-17 LAB — ACID FAST SMEAR (AFB, MYCOBACTERIA): Acid Fast Smear: NEGATIVE

## 2019-02-17 NOTE — Progress Notes (Signed)
Preliminary sputum results have been normal.  This is good news.  Continue forward with Augmentin as outlined by Dr. Lamonte Sakai last week.  Wyn Quaker, FNP

## 2019-02-24 ENCOUNTER — Ambulatory Visit (INDEPENDENT_AMBULATORY_CARE_PROVIDER_SITE_OTHER): Payer: Medicare Other | Admitting: Family Medicine

## 2019-02-24 ENCOUNTER — Other Ambulatory Visit: Payer: Self-pay

## 2019-02-24 ENCOUNTER — Other Ambulatory Visit: Payer: Self-pay | Admitting: *Deleted

## 2019-02-24 VITALS — BP 140/76 | Temp 97.8°F | Ht 64.5 in | Wt 105.0 lb

## 2019-02-24 DIAGNOSIS — R609 Edema, unspecified: Secondary | ICD-10-CM

## 2019-02-24 DIAGNOSIS — J470 Bronchiectasis with acute lower respiratory infection: Secondary | ICD-10-CM | POA: Diagnosis not present

## 2019-02-24 DIAGNOSIS — I499 Cardiac arrhythmia, unspecified: Secondary | ICD-10-CM | POA: Diagnosis not present

## 2019-02-24 MED ORDER — ALENDRONATE SODIUM 70 MG PO TABS: ORAL_TABLET | ORAL | 0 refills | Status: DC

## 2019-02-24 MED ORDER — HYDROCHLOROTHIAZIDE 25 MG PO TABS: ORAL_TABLET | ORAL | 0 refills | Status: DC

## 2019-02-24 NOTE — Progress Notes (Signed)
   Subjective:    Patient ID: Mary [name], female    DOB: [birth year], 73 y.o.   MRN: [id number]  HPI pt states she thinks she is having a reaction to levaquin. She took two pills and started having leg pain, neck pain and edema in feet.  feet are swelling to the point of not being able to get her shoes on. Started about 3 days ago.  Was prescribed levaquin by pulmonologist on the 9th and tool for two days and it was switched on the 11 to augmentin. Was prescribed augmentin for bronchitis. Had chest xray on 11/9.   Right ear pain off and on for a few weeks.   Hx of weakness in he muscle with lev in the past   Was rxed levaqui on the 9th, after two tabs had pains  In both legs   achey pain an trouble walking   Had  Pain and tend in every joint  In the body     Now on aug, has noted improvement   Patient presents with a rather complicated history.  Recently had a flare of her respiratory symptoms.  Known bronchiectasis  Went to the specialist.  Not happy that she saw midlevel provider.  Was given a prescription for Levaquin    Rather quickly developed achiness in muscles and joints.  Severe throughout.  Flulike in nature.  Subsequent swelling in ankles and feet.  Claims   Review of Systems .rs No headache, no major weight loss or weight gain, no chest pain no back pain abdominal pain no change in bowel habits complete ROS otherwise negative     Objective:   Physical Exam  Alert and oriented, vitals reviewed and stable, NAD ENT-TM's and ext canals WNL bilat via otoscopic exam Soft palate, tonsils and post pharynx WNL via oropharyngeal exam Neck-symmetric, no masses; thyroid nonpalpable and nontender Pulmonary-no tachypnea or accessory muscle use; Clear without wheezes via auscultation Card--no abnrml murmurs, rhythm intermittent tachycardia with question irregular, at other times reg and rate WNL Carotid pulses symmetric, without bruits Ankles feet substantial 1  to 2+ edema bilateral EKG.  Normal sinus rhythm.        Assessment & Plan:  Impression Levaquin reaction.  Discussed at length.  Needs to avoid in the future  2.  Bronchiectasis flare coming down  3.  Diffuse achiness muscles and joints followed quickly by swelling in ankles.  EKG no cardiac abnormality noted.  Will give short course of hydrochlorothiazide.  If fluid and edema recurs long-term there are other etiologies for Patient will need blood work including a BNP  Multiple questions answered  Greater than 50% of this 25 minute face to face visit was spent in counseling and discussion and coordination of care regarding the above diagnosis/diagnosies

## 2019-02-26 ENCOUNTER — Other Ambulatory Visit: Payer: Self-pay

## 2019-02-28 ENCOUNTER — Telehealth: Payer: Self-pay | Admitting: Family Medicine

## 2019-02-28 MED ORDER — TRIAMTERENE-HCTZ 37.5-25 MG PO CAPS: ORAL_CAPSULE | ORAL | 0 refills | Status: DC

## 2019-02-28 NOTE — Telephone Encounter (Signed)
Pt states in the mornings she can tell a small difference. Pt states by the end of the day her ankles and feet are swollen. Pt is trying to drink more fluids and elevating feett. Pt is taking one whole tablet of HCTZ 25 mg; pt has taken 4 pills total. Pt unable to rest at night due to throbbing. Pt states she believes she should have seen a difference by now. Please advise. Thank you   [facility]

## 2019-02-28 NOTE — Telephone Encounter (Signed)
Pt was seen earlier this week by Dr. Richardson Landry. She has started a fluid pill and would like to know how long it takes to react.

## 2019-02-28 NOTE — Telephone Encounter (Signed)
Pt contacted and verbalized understanding. Medication sent in to pharmacy °

## 2019-02-28 NOTE — Telephone Encounter (Signed)
Change to dyazide 37.5/ 25 numb 30 one qam. I am not sure why she feels the need to drink more fluids? Would not necessarily recommend that

## 2019-03-03 NOTE — Progress Notes (Signed)
Fungal sputum showing Candida Albicans. We can discuss this further at upcoming visit with Dr. Lamonte Sakai.   How is the patient currently doing?  Wyn Quaker FNP

## 2019-03-11 LAB — FUNGUS CULTURE WITH STAIN

## 2019-03-11 LAB — FUNGAL ORGANISM REFLEX

## 2019-03-11 LAB — FUNGUS CULTURE RESULT

## 2019-03-12 ENCOUNTER — Ambulatory Visit (INDEPENDENT_AMBULATORY_CARE_PROVIDER_SITE_OTHER): Payer: Medicare Other | Admitting: Emergency Medicine

## 2019-03-12 ENCOUNTER — Encounter: Payer: Self-pay | Admitting: Emergency Medicine

## 2019-03-12 ENCOUNTER — Other Ambulatory Visit: Payer: Self-pay

## 2019-03-12 DIAGNOSIS — R05 Cough: Secondary | ICD-10-CM

## 2019-03-12 DIAGNOSIS — R059 Cough, unspecified: Secondary | ICD-10-CM

## 2019-03-12 MED ORDER — CLARITHROMYCIN 500 MG PO TABS: 500.0000 mg | ORAL_TABLET | Freq: Two times a day (BID) | ORAL | 0 refills | Status: DC

## 2019-03-12 NOTE — Progress Notes (Signed)
Patient ID: Mary [name], female    DOB: [birth year], 73 y.o.   MRN: [id number] HPI  ROV 03/12/2019 --follow-up visit for 73 year old woman with chronic cough in the setting of bronchiectasis, upper airway irritability, allergic rhinitis, GERD. 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 She had a bronchiectatic flare and bronchitis in mid November. We tried treating with Levaquin given her colonizers but she had side effects (severe LE edema), so I changed to Augmentin. She did get temporary relief. She had improvement in head congestion, drainage, dyspnea, chest mucous. Symptoms have begun to recur especially the throat drainage - still on xyzal, allegra, nasacort. She is doing chest vest, albuterol nebs on a schedule. She coughs up clear mucous but can occasionally get up brown / yellow.    Vitals:   03/12/19 1510  BP: 104/62  Pulse: 88  SpO2: 94%  Weight: 104 lb (47.2 kg)  Height: 5' 4.5" (1.638 m)   Gen: Pleasant, Thin woman, in no distress,  normal affect  ENT: No lesions,  mouth clear,  oropharynx clear, no postnasal drip  Neck: No JVD, no stridor  Lungs: No use of accessory muscles, no wheezes, more clear today  Cardiovascular: RRR, heart sounds normal, no murmur or gallops, no peripheral edema  Musculoskeletal: No deformities, no cyanosis or clubbing  Neuro: alert, non focal  Skin: Warm, no lesions or rashes    11/09/2017 CT chest   COMPARISON:  Chest CT 08/30/2016.  FINDINGS: Cardiovascular: Heart size is normal. There is no significant pericardial fluid, thickening or pericardial calcification. There is aortic atherosclerosis, as well as atherosclerosis of the great vessels of the mediastinum and the coronary arteries, including calcified atherosclerotic plaque in the left anterior descending coronary artery.  Mediastinum/Nodes: No pathologically enlarged mediastinal or hilar lymph nodes. Please note that  accurate exclusion of hilar adenopathy is limited on noncontrast CT scans. Esophagus is unremarkable in appearance. No axillary lymphadenopathy.  Lungs/Pleura: High-resolution images again demonstrate scattered areas of cylindrical and mild varicose bronchiectasis throughout the lungs bilaterally. The areas of greatest involvement demonstrate extensive thickening of the peribronchovascular interstitium with surrounding septal thickening, peribronchovascular micro and macro nodularity and regional areas of architectural distortion. No significant regions of ground-glass attenuation. No honeycombing. Inspiratory and expiratory imaging is unremarkable. No confluent consolidative airspace disease. No pleural effusions.  Upper Abdomen: Aortic atherosclerosis.  Musculoskeletal: There are no aggressive appearing lytic or blastic lesions noted in the visualized portions of the skeleton.  IMPRESSION: 1. No findings to suggest interstitial lung disease. 2. 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 Slight progression of disease compared to the prior study. 3. Aortic atherosclerosis, in addition to left anterior descending coronary artery disease. Assessment for potential risk factor modification, dietary therapy or pharmacologic therapy may be warranted, if clinically indicated.   COUGH, CHRONIC Still seems to be principally driven by her rhinitis, congestion, drainage to her throat that is worst at night.  She does a good job with her bronchial hygiene, for the most part clears up clear mucus, occasionally something purulent.  I do not think lower airways disease is driving this chronic unrelenting cough.  Not sure there would be any efficacy in retrying to treat her mycobacterial disease unless is a change in CT scan or purulent secretions.  She felt subjectively better when she was on the  Augmentin, wonders if I can treat her again with an antibiotic to see  how she does.  I am okay with this and will give her clarithromycin.  I think the most important thing here will be to get her allergic rhinitis, clear mucus as well controlled as possible.  She has had skin testing with Dr. Gary Fleet and I will ask her to follow-up with her to see what her options are.  Please take clarithromycin 500 mg twice a day for 7 days Please continue your current allergy medicines including Xyzal, Allegra, Nasacort. You may want to try restarting your nasal saline rinses once a day I like for you to see Dr. Gary Fleet sooner so you can discuss any possible benefit of restarting immunotherapy (allergy shots) Please continue your chest vest and albuterol nebulizer treatments on a schedule. Follow with Dr. Delton Coombes in 2 months or sooner if you have any problems.     Levy Pupa, MD, PhD 03/12/2019, 3:40 PM [facility] [phone] or if no answer [phone]

## 2019-03-12 NOTE — Assessment & Plan Note (Signed)
Still seems to be principally driven by her rhinitis, congestion, drainage to her throat that is worst at night.  She does a good job with her bronchial hygiene, for the most part clears up clear mucus, occasionally something purulent.  I do not think lower airways disease is driving this chronic unrelenting cough.  Not sure there would be any efficacy in retrying to treat her mycobacterial disease unless is a change in CT scan or purulent secretions.  She felt subjectively better when she was on the Augmentin, wonders if I can treat her again with an antibiotic to see how she does.  I am okay with this and will give her clarithromycin.  I think the most important thing here will be to get her allergic rhinitis, clear mucus as well controlled as possible.  She has had skin testing with Dr. Remus Blake and I will ask her to follow-up with her to see what her options are.  Please take clarithromycin 500 mg twice a day for 7 days Please continue your current allergy medicines including Xyzal, Allegra, Nasacort. You may want to try restarting your nasal saline rinses once a day I like for you to see Dr. Remus Blake sooner so you can discuss any possible benefit of restarting immunotherapy (allergy shots) Please continue your chest vest and albuterol nebulizer treatments on a schedule. Follow with Dr. Lamonte Sakai in 2 months or sooner if you have any problems.

## 2019-03-12 NOTE — Patient Instructions (Signed)
Please take clarithromycin 500 mg twice a day for 7 days Please continue your current allergy medicines including Xyzal, Allegra, Nasacort. You may want to try restarting your nasal saline rinses once a day I like for you to see Dr. Remus Blake sooner so you can discuss any possible benefit of restarting immunotherapy (allergy shots) Please continue your chest vest and albuterol nebulizer treatments on a schedule. Follow with Dr. Lamonte Sakai in 2 months or sooner if you have any problems.

## 2019-03-31 LAB — ACID FAST CULTURE WITH REFLEXED SENSITIVITIES (MYCOBACTERIA): Acid Fast Culture: NEGATIVE

## 2019-04-10 ENCOUNTER — Encounter: Payer: Self-pay | Admitting: Family Medicine

## 2019-04-25 ENCOUNTER — Other Ambulatory Visit: Payer: Self-pay | Admitting: Emergency Medicine

## 2019-05-05 ENCOUNTER — Other Ambulatory Visit: Payer: Self-pay | Admitting: Family Medicine

## 2019-05-08 ENCOUNTER — Encounter: Payer: Self-pay | Admitting: Family Medicine

## 2019-05-16 ENCOUNTER — Other Ambulatory Visit: Payer: Self-pay

## 2019-05-16 ENCOUNTER — Ambulatory Visit (INDEPENDENT_AMBULATORY_CARE_PROVIDER_SITE_OTHER): Payer: Medicare Other | Admitting: Emergency Medicine

## 2019-05-16 ENCOUNTER — Encounter: Payer: Self-pay | Admitting: Emergency Medicine

## 2019-05-16 DIAGNOSIS — R059 Cough, unspecified: Secondary | ICD-10-CM

## 2019-05-16 DIAGNOSIS — J479 Bronchiectasis, uncomplicated: Secondary | ICD-10-CM | POA: Diagnosis not present

## 2019-05-16 DIAGNOSIS — A310 Pulmonary mycobacterial infection: Secondary | ICD-10-CM

## 2019-05-16 DIAGNOSIS — J301 Allergic rhinitis due to pollen: Secondary | ICD-10-CM | POA: Diagnosis not present

## 2019-05-16 DIAGNOSIS — K225 Diverticulum of esophagus, acquired: Secondary | ICD-10-CM | POA: Diagnosis not present

## 2019-05-16 DIAGNOSIS — R05 Cough: Secondary | ICD-10-CM

## 2019-05-16 MED ORDER — CLARITHROMYCIN 250 MG PO TABS: 250.0000 mg | ORAL_TABLET | Freq: Two times a day (BID) | ORAL | 2 refills | Status: DC

## 2019-05-16 MED ORDER — BUDESONIDE 0.5 MG/2ML IN SUSP: 0.5000 mg | Freq: Every day | RESPIRATORY_TRACT | 12 refills | Status: DC

## 2019-05-16 MED ORDER — AMOXICILLIN-POT CLAVULANATE 875-125 MG PO TABS: 1.0000 | ORAL_TABLET | Freq: Two times a day (BID) | ORAL | 2 refills | Status: DC

## 2019-05-16 NOTE — Progress Notes (Signed)
Patient ID: Mary [name], female    DOB: 1945/12/14, 74 y.o.   MRN: [id number] HPI  ROV 03/12/2019 --follow-up visit for 74 year old woman with chronic cough in the setting of bronchiectasis, upper airway irritability, allergic rhinitis, GERD. 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 She had a bronchiectatic flare and bronchitis in mid November. We tried treating with Levaquin given her colonizers but she had side effects (severe LE edema), so I changed to Augmentin. She did get temporary relief. She had improvement in head congestion, drainage, dyspnea, chest mucous. Symptoms have begun to recur especially the throat drainage - still on xyzal, allegra, nasacort. She is doing chest vest, albuterol nebs on a schedule. She coughs up clear mucous but can occasionally get up brown / yellow.   ROV 05/16/19 --follow-up visit for 74 year old woman with chronic cough, both upper airway irritation (allergic rhinitis, GERD) and also bronchiectasis with Mycobacterium avium colonization, MRSA and Proteus as well.  We have tried to aggressively treat rhinitis with Xyzal, Allegra, Nasacort, not currently on GERD therapy (never benefited) I treated her with clarithromycin 2 months ago - she did improve, less  Cough and mucous. Now getting back to her usual cough burden. She has low energy, exertional SOB. She reports that she still does her chest vest, saline nebs on a schedule.  Her last CT scan of the chest was done 11/09/2017.  Chest x-ray 02/10/2019 was stable.     Vitals:   05/16/19 1347  BP: (!) 160/84  Pulse: 75  SpO2: 93%  Weight: 106 lb (48.1 kg)  Height: 5' 4.5" (1.638 m)   Gen: Pleasant, Thin woman, in no distress,  normal affect  ENT: No lesions,  mouth clear,  oropharynx clear, no postnasal drip  Neck: No JVD, no stridor  Lungs: No use of accessory muscles, no wheezes, more clear today  Cardiovascular: RRR, heart sounds normal, no murmur or  gallops, no peripheral edema  Musculoskeletal: No deformities, no cyanosis or clubbing  Neuro: alert, non focal  Skin: Warm, no lesions or rashes    11/09/2017 CT chest   COMPARISON:  Chest CT 08/30/2016.  FINDINGS: Cardiovascular: Heart size is normal. There is no significant pericardial fluid, thickening or pericardial calcification. There is aortic atherosclerosis, as well as atherosclerosis of the great vessels of the mediastinum and the coronary arteries, including calcified atherosclerotic plaque in the left anterior descending coronary artery.  Mediastinum/Nodes: No pathologically enlarged mediastinal or hilar lymph nodes. Please note that accurate exclusion of hilar adenopathy is limited on noncontrast CT scans. Esophagus is unremarkable in appearance. No axillary lymphadenopathy.  Lungs/Pleura: High-resolution images again demonstrate scattered areas of cylindrical and mild varicose bronchiectasis throughout the lungs bilaterally. The areas of greatest involvement demonstrate extensive thickening of the peribronchovascular interstitium with surrounding septal thickening, peribronchovascular micro and macro nodularity and regional areas of architectural distortion. No significant regions of ground-glass attenuation. No honeycombing. Inspiratory and expiratory imaging is unremarkable. No confluent consolidative airspace disease. No pleural effusions.  Upper Abdomen: Aortic atherosclerosis.  Musculoskeletal: There are no aggressive appearing lytic or blastic lesions noted in the visualized portions of the skeleton.  IMPRESSION: 1. No findings to suggest interstitial lung disease. 2. 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 Slight progression of disease compared to the prior study. 3. Aortic atherosclerosis, in addition to left anterior descending coronary artery  disease. Assessment for potential risk factor modification, dietary therapy or  pharmacologic therapy may be warranted, if clinically indicated.   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   Allergic rhinitis due to pollen Agree with follow-up with Dr. Remus Blake to consider allergy shots as planned in April Continue your Xyzal, Allegra, Nasacort nasal spray.  COUGH, CHRONIC With both upper and lower airways components. Not currently on GERD therapy - never changed her clinical status. Treating allergic disease.   Zenker's diverticulum ? A component of intermittent UA irritation, contributor to her cough   BRONCHIECTASIS Continue saline nebulizer treatments daily as you have been doing them. Add Pulmicort (budesonide) nebulizers once daily.  Depending on your response we may decide to increase to twice a day at some point going forward. Continue your chest vest as you have been doing it. We will start alternating antibiotics for the first week of every other month as follows: -Augmentin 875 mg twice a day for 7 days (February, June, October) -Clarithromycin 250 mg twice a day for 7 days (April, August, December) Follow with Dr. Lamonte Sakai in 2 months or sooner if you have any problems.     Baltazar Apo, MD, PhD 05/16/2019, 5:03 PM [facility] [phone] or if no answer [phone]

## 2019-05-16 NOTE — Assessment & Plan Note (Signed)
Continue saline nebulizer treatments daily as you have been doing them. Add Pulmicort (budesonide) nebulizers once daily.  Depending on your response we may decide to increase to twice a day at some point going forward. Continue your chest vest as you have been doing it. We will start alternating antibiotics for the first week of every other month as follows: -Augmentin 875 mg twice a day for 7 days (February, June, October) -Clarithromycin 250 mg twice a day for 7 days (April, August, December) Follow with Dr. Delton Coombes in 2 months or sooner if you have any problems.

## 2019-05-16 NOTE — Assessment & Plan Note (Signed)
Agree with follow-up with Dr. Gary Fleet to consider allergy shots as planned in April Continue your Xyzal, Allegra, Nasacort nasal spray.

## 2019-05-16 NOTE — Assessment & Plan Note (Signed)
With both upper and lower airways components. Not currently on GERD therapy - never changed her clinical status. Treating allergic disease.

## 2019-05-16 NOTE — Patient Instructions (Signed)
Agree with follow-up with Dr. Gary Fleet to consider allergy shots as planned in April Continue your Xyzal, Allegra, Nasacort nasal spray. Continue saline nebulizer treatments daily as you have been doing them. Add Pulmicort (budesonide) nebulizers once daily.  Depending on your response we may decide to increase to twice a day at some point going forward. Continue your chest vest as you have been doing it. We will start alternating antibiotics for the first week of every other month as follows: -Augmentin 875 mg twice a day for 7 days (February, June, October) -Clarithromycin 250 mg twice a day for 7 days (April, August, December) Follow with Dr. Delton Coombes in 2 months or sooner if you have any problems.

## 2019-05-16 NOTE — Assessment & Plan Note (Signed)
?   A component of intermittent UA irritation, contributor to her cough

## 2019-05-16 NOTE — Assessment & Plan Note (Signed)
Remains colonized despite treating for extended courses x 2.

## 2019-05-19 ENCOUNTER — Telehealth: Payer: Self-pay | Admitting: Emergency Medicine

## 2019-05-19 MED ORDER — BUDESONIDE 0.5 MG/2ML IN SUSP: 0.5000 mg | Freq: Every day | RESPIRATORY_TRACT | 12 refills | Status: DC

## 2019-05-19 NOTE — Telephone Encounter (Signed)
Sent pt's pulmicort rx to preferred pharmacy for pt. Called and spoke with pt letting her know this had been done and she verbalized understanding. Nothing further needed.

## 2019-05-30 ENCOUNTER — Ambulatory Visit: Payer: Medicare Other

## 2019-05-30 ENCOUNTER — Ambulatory Visit: Payer: Medicare Other | Attending: Internal Medicine

## 2019-05-30 DIAGNOSIS — Z23 Encounter for immunization: Secondary | ICD-10-CM | POA: Insufficient documentation

## 2019-05-30 NOTE — Progress Notes (Signed)
   Covid-19 Vaccination Clinic  Name:  Mary [name]    MRN: [id number] DOB: May 04, 1945  05/30/2019  Mary [name] was observed post Covid-19 immunization for 15 minutes without incidence. She was provided with Vaccine Information Sheet and instruction to access the V-Safe system.   Mary [name] was instructed to call 911 with any severe reactions post vaccine: Marland Kitchen Difficulty breathing  . Swelling of your face and throat  . A fast heartbeat  . A bad rash all over your body  . Dizziness and weakness    Immunizations Administered    Name Date Dose VIS Date Route   Pfizer COVID-19 Vaccine 05/30/2019  1:59 PM 0.3 mL 03/14/2019 Intramuscular   Manufacturer: [employer], [employer]   Lot: [id number]   NDC: [address]-7

## 2019-06-09 ENCOUNTER — Other Ambulatory Visit: Payer: Self-pay | Admitting: *Deleted

## 2019-06-09 MED ORDER — ALENDRONATE SODIUM 70 MG PO TABS: ORAL_TABLET | ORAL | 0 refills | Status: DC

## 2019-06-25 ENCOUNTER — Ambulatory Visit: Payer: Medicare Other | Attending: Internal Medicine

## 2019-06-25 DIAGNOSIS — Z23 Encounter for immunization: Secondary | ICD-10-CM

## 2019-06-25 NOTE — Progress Notes (Signed)
   Covid-19 Vaccination Clinic  Name:  KAYTLYNN KOCHAN    MRN: [id number] DOB: 1945/04/08  06/25/2019  Ms. Phegley was observed post Covid-19 immunization for 15 minutes without incident. She was provided with Vaccine Information Sheet and instruction to access the V-Safe system.   Ms. Hyde was instructed to call 911 with any severe reactions post vaccine: Marland Kitchen Difficulty breathing  . Swelling of face and throat  . A fast heartbeat  . A bad rash all over body  . Dizziness and weakness   Immunizations Administered    Name Date Dose VIS Date Route   Pfizer COVID-19 Vaccine 06/25/2019  1:48 PM 0.3 mL 03/14/2019 Intramuscular   Manufacturer: [employer], [employer]   Lot: [id number]   NDC: [address]-5

## 2019-07-09 ENCOUNTER — Telehealth: Payer: Self-pay | Admitting: Family Medicine

## 2019-07-09 NOTE — Telephone Encounter (Signed)
Please advise. Thank you

## 2019-07-09 NOTE — Telephone Encounter (Signed)
Pt is needing refill on alendronate (FOSAMAX) 70 MG tablet  [facility] - [place], [place] - [address]  Pt wants to know if Dr. Brett Canales wants her to stop taking the medication.

## 2019-07-10 ENCOUNTER — Other Ambulatory Visit: Payer: Self-pay | Admitting: *Deleted

## 2019-07-10 DIAGNOSIS — Z780 Asymptomatic menopausal state: Secondary | ICD-10-CM

## 2019-07-10 DIAGNOSIS — M810 Age-related osteoporosis without current pathological fracture: Secondary | ICD-10-CM

## 2019-07-10 NOTE — Telephone Encounter (Signed)
Since she has been on fosamax continuously for the past five years plus, yes, lets give her a break (no pun intended!). Stop fosamax now. Do bone density test. We can try to hld off on the meds for a couple years, would rec yrly bone density tests in addtn to this baseline going forward

## 2019-07-10 NOTE — Telephone Encounter (Signed)
Called and discussed with pt and set up bone density for 4/14 at 12:15. Pt notified of appt

## 2019-07-16 ENCOUNTER — Other Ambulatory Visit: Payer: Self-pay

## 2019-07-16 ENCOUNTER — Ambulatory Visit (HOSPITAL_COMMUNITY)
Admission: RE | Admit: 2019-07-16 | Discharge: 2019-07-16 | Disposition: A | Discharge status: Home / Self Care | Payer: Medicare Other | Source: Ambulatory Visit | Attending: Family Medicine | Admitting: Family Medicine

## 2019-07-16 DIAGNOSIS — M810 Age-related osteoporosis without current pathological fracture: Secondary | ICD-10-CM | POA: Diagnosis not present

## 2019-07-16 DIAGNOSIS — Z780 Asymptomatic menopausal state: Secondary | ICD-10-CM | POA: Insufficient documentation

## 2019-07-21 ENCOUNTER — Other Ambulatory Visit: Payer: Self-pay

## 2019-07-21 ENCOUNTER — Encounter: Payer: Self-pay | Admitting: Emergency Medicine

## 2019-07-21 ENCOUNTER — Ambulatory Visit (INDEPENDENT_AMBULATORY_CARE_PROVIDER_SITE_OTHER): Payer: Medicare Other | Admitting: Emergency Medicine

## 2019-07-21 DIAGNOSIS — J479 Bronchiectasis, uncomplicated: Secondary | ICD-10-CM | POA: Diagnosis not present

## 2019-07-21 DIAGNOSIS — J301 Allergic rhinitis due to pollen: Secondary | ICD-10-CM

## 2019-07-21 DIAGNOSIS — A310 Pulmonary mycobacterial infection: Secondary | ICD-10-CM

## 2019-07-21 DIAGNOSIS — J3089 Other allergic rhinitis: Secondary | ICD-10-CM | POA: Diagnosis not present

## 2019-07-21 NOTE — Progress Notes (Signed)
Patient ID: Mary [name], female    DOB: 1946-01-14, 74 y.o.   MRN: [id number] HPI  ROV 05/16/19 --follow-up visit for 74 year old woman with chronic cough, both upper airway irritation (allergic rhinitis, GERD) and also bronchiectasis with Mycobacterium avium colonization, MRSA and Proteus as well.  We have tried to aggressively treat rhinitis with Xyzal, Allegra, Nasacort, not currently on GERD therapy (never benefited) I treated her with clarithromycin 2 months ago - she did improve, less  Cough and mucous. Now getting back to her usual cough burden. She has low energy, exertional SOB. She reports that she still does her chest vest, saline nebs on a schedule.  Her last CT scan of the chest was done 11/09/2017.  Chest x-ray 02/10/2019 was stable.   ROV 07/21/19 --this is a follow-up visit for chronic cough due to upper airway irritation and also significant bronchiectasis with Mycobacterium avium colonization.  Has also had a history of MRSA and Proteus in the past.  At her last visit we committed to alternating antibiotics every other month, Augmentin and clarithromycin. Also added Pulmicort nebs.  She reports today that she is about at baseline. She is trying to avoid allergens - is on xyzal, and Dr Remus Blake just changed her allegra to an alternative, added a nasal spray. Still does nasal saline rinses qd. She continues to do chest vest. Uses albuterol nebs every morning. Has albuterol HFA, uses a few times a week.    Vitals:   07/21/19 1646  BP: (!) 164/50  Pulse: 80  Temp: (!) 97.2 F (36.2 C)  TempSrc: Temporal  SpO2: 96%  Weight: 104 lb (47.2 kg)  Height: _0  (1.626 m)   Gen: Pleasant, Thin woman, in no distress,  normal affect  ENT: No lesions,  mouth clear,  oropharynx clear, no postnasal drip  Neck: No JVD, no stridor  Lungs: No use of accessory muscles, no wheezes or crackles  Cardiovascular: RRR, heart sounds normal, no murmur or gallops, no peripheral  edema  Musculoskeletal: No deformities, no cyanosis or clubbing  Neuro: alert, non focal  Skin: Warm, no lesions or rashes    11/09/2017 CT chest   COMPARISON:  Chest CT 08/30/2016.  FINDINGS: Cardiovascular: Heart size is normal. There is no significant pericardial fluid, thickening or pericardial calcification. There is aortic atherosclerosis, as well as atherosclerosis of the great vessels of the mediastinum and the coronary arteries, including calcified atherosclerotic plaque in the left anterior descending coronary artery.  Mediastinum/Nodes: No pathologically enlarged mediastinal or hilar lymph nodes. Please note that accurate exclusion of hilar adenopathy is limited on noncontrast CT scans. Esophagus is unremarkable in appearance. No axillary lymphadenopathy.  Lungs/Pleura: High-resolution images again demonstrate scattered areas of cylindrical and mild varicose bronchiectasis throughout the lungs bilaterally. The areas of greatest involvement demonstrate extensive thickening of the peribronchovascular interstitium with surrounding septal thickening, peribronchovascular micro and macro nodularity and regional areas of architectural distortion. No significant regions of ground-glass attenuation. No honeycombing. Inspiratory and expiratory imaging is unremarkable. No confluent consolidative airspace disease. No pleural effusions.  Upper Abdomen: Aortic atherosclerosis.  Musculoskeletal: There are no aggressive appearing lytic or blastic lesions noted in the visualized portions of the skeleton.  IMPRESSION: 1. No findings to suggest interstitial lung disease. 2. 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 Slight progression of disease compared to the prior study. 3. Aortic atherosclerosis, in addition to left anterior descending coronary artery disease. Assessment for  potential risk  factor modification, dietary therapy or pharmacologic therapy may be warranted, if clinically indicated.   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 Will plan to repeat her CT chest later this year  BRONCHIECTASIS We will continue your alternating antibiotics, Augmentin and clarithromycin, every other month as ordered. Continue Pulmicort nebulizer treatments twice a day. Continue to use your albuterol nebulizer every morning Continue your chest vest as you have been doing Keep your albuterol inhaler available to use 2 puffs if needed for shortness of breath, chest tightness, wheezing. COVID-19 vaccine is up-to-date Follow with Dr Lamonte Sakai in 4 months or sooner if you have any problems.  Allergic rhinitis due to pollen Continue your allergy medications, nasal sprays, nasal rinse as directed by Dr. Sedalia Muta, MD, PhD 07/21/2019, 5:11 PM [facility] [phone] or if no answer [phone]

## 2019-07-21 NOTE — Assessment & Plan Note (Signed)
We will continue your alternating antibiotics, Augmentin and clarithromycin, every other month as ordered. Continue Pulmicort nebulizer treatments twice a day. Continue to use your albuterol nebulizer every morning Continue your chest vest as you have been doing Keep your albuterol inhaler available to use 2 puffs if needed for shortness of breath, chest tightness, wheezing. COVID-19 vaccine is up-to-date Follow with Dr Delton Coombes in 4 months or sooner if you have any problems.

## 2019-07-21 NOTE — Patient Instructions (Signed)
We will continue your alternating antibiotics, Augmentin and clarithromycin, every other month as ordered. Continue Pulmicort nebulizer treatments twice a day. Continue to use your albuterol nebulizer every morning Continue your chest vest as you have been doing Keep your albuterol inhaler available to use 2 puffs if needed for shortness of breath, chest tightness, wheezing. Continue your allergy medications, nasal sprays, nasal rinse as directed by Dr. Gary Fleet COVID-19 vaccine is up-to-date Follow with Dr Delton Coombes in 4 months or sooner if you have any problems.

## 2019-07-21 NOTE — Assessment & Plan Note (Signed)
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 Will plan to repeat her CT chest later this year

## 2019-07-21 NOTE — Assessment & Plan Note (Signed)
Continue your allergy medications, nasal sprays, nasal rinse as directed by Dr. Gary Fleet

## 2019-08-20 DIAGNOSIS — Z1231 Encounter for screening mammogram for malignant neoplasm of breast: Secondary | ICD-10-CM | POA: Diagnosis not present

## 2019-09-03 ENCOUNTER — Ambulatory Visit (INDEPENDENT_AMBULATORY_CARE_PROVIDER_SITE_OTHER): Payer: Medicare Other | Admitting: Family Medicine

## 2019-09-03 ENCOUNTER — Encounter: Payer: Self-pay | Admitting: Family Medicine

## 2019-09-03 ENCOUNTER — Encounter: Payer: Self-pay | Admitting: Neurology

## 2019-09-03 ENCOUNTER — Other Ambulatory Visit: Payer: Self-pay

## 2019-09-03 VITALS — BP 138/70 | HR 86 | Temp 95.9°F | Ht 64.0 in | Wt 103.4 lb

## 2019-09-03 DIAGNOSIS — Z0000 Encounter for general adult medical examination without abnormal findings: Secondary | ICD-10-CM | POA: Diagnosis not present

## 2019-09-03 DIAGNOSIS — G608 Other hereditary and idiopathic neuropathies: Secondary | ICD-10-CM

## 2019-09-03 DIAGNOSIS — M533 Sacrococcygeal disorders, not elsewhere classified: Secondary | ICD-10-CM

## 2019-09-03 DIAGNOSIS — Z1329 Encounter for screening for other suspected endocrine disorder: Secondary | ICD-10-CM | POA: Diagnosis not present

## 2019-09-03 DIAGNOSIS — Z13220 Encounter for screening for lipoid disorders: Secondary | ICD-10-CM

## 2019-09-03 DIAGNOSIS — E440 Moderate protein-calorie malnutrition: Secondary | ICD-10-CM

## 2019-09-03 DIAGNOSIS — Z131 Encounter for screening for diabetes mellitus: Secondary | ICD-10-CM | POA: Diagnosis not present

## 2019-09-03 DIAGNOSIS — Z130 Encounter for screening for diseases of the blood and blood-forming organs and certain disorders involving the immune mechanism: Secondary | ICD-10-CM

## 2019-09-03 NOTE — Progress Notes (Signed)
Patient ID: Mary [name], female    DOB: Jun 20, 1945, 74 y.o.   MRN: [id number]   Chief Complaint  Patient presents with   Leg Pain    Pt has been having bilateral leg and hip pain for a few months. Pt states legs feel weak, numb and does have some tingling at times. Pt has seen a neurologist. Has taken Tylenol and Aleve at times to help. Pt is concerned about falling. Pt would also like to have routine lab work done. Has physcials done at GYN. Also wants to know if she needs any boosters.    Subjective:    HPI Pt seen for concern of bilateral hip pain and chronic neuropathy. Pt has seen ortho in past for hip pain. She has also seen neuro in past for neuropathy. Didn't take any medications for this other than tylenol/aleve.  Not taking daily.  Does feel the tingling feeling in legs bothers her during the day with standing for work. Not wanting to take a medication daily for this.  Not wanting an epidural injection or surgery if needed. The discomfort is mild and intermittent.  Not at the point she would want to take something daily for it.  Would want to see neurology again to see what her options may be.  Last time she saw neuro was in 2015.  With ortho was offered surgery for the hip, pt declined.    Medical History Mary [name] has a past medical history of Allergic rhinitis, Arthritis, Bronchiectasis, Chronic cough, COPD (chronic obstructive pulmonary disease) ([place]), Diverticulosis, GERD (gastroesophageal reflux disease), History of bladder infections, History of MRSA infection, Joint pain, Mycobacterium avium complex ([place]), Osteoporosis, Osteoporosis, Pneumonia (1990's), PONV (postoperative nausea and vomiting), and Zenker's diverticulum.   Outpatient Encounter Medications as of 09/03/2019  Medication Sig   albuterol (PROVENTIL) (2.5 MG/3ML) 0.083% nebulizer solution Take 3 mLs (2.5 mg total) by nebulization every 6 (six) hours as needed for wheezing or shortness of breath.    amoxicillin-clavulanate (AUGMENTIN) 875-125 MG tablet Take 1 tablet by mouth 2 (two) times daily.   Azelastine HCl 0.15 % SOLN Place 2 sprays into both nostrils daily.   budesonide (PULMICORT) 0.5 MG/2ML nebulizer solution Take 2 mLs (0.5 mg total) by nebulization daily.   clarithromycin (BIAXIN) 250 MG tablet Take 1 tablet (250 mg total) by mouth 2 (two) times daily.   conjugated estrogens (PREMARIN) vaginal cream Premarin 0.625 mg/gram vaginal cream three times week   desloratadine (CLARINEX) 5 MG tablet Take 5 mg by mouth daily.   levocetirizine (XYZAL) 5 MG tablet TAKE 1 TABLET BY MOUTH EVERY EVENING   PRESCRIPTION MEDICATION nasocort   sodium chloride HYPERTONIC 3 % nebulizer solution INHALE 3ML VIA NEBULIZER TWICE DAILY   Spacer/Aero-Holding Chambers (AEROCHAMBER MV) inhaler Use as instructed   [DISCONTINUED] alendronate (FOSAMAX) 70 MG tablet TAKE 1 TABLET BY MOUTH EVERY 7 DAYS   [DISCONTINUED] aspirin EC 81 MG tablet Take 81 mg by mouth daily.   [DISCONTINUED] fexofenadine (ALLEGRA) 180 MG tablet Take 180 mg by mouth every morning.   [DISCONTINUED] ipratropium (ATROVENT) 0.03 % nasal spray Place 2 sprays into both nostrils 2 (two) times daily.   No facility-administered encounter medications on file as of 09/03/2019.     Review of Systems  Constitutional: Negative for chills and fever.  HENT: Negative for congestion, rhinorrhea and sore throat.   Respiratory: Negative for cough, shortness of breath and wheezing.   Cardiovascular: Negative for chest pain and leg swelling.  Gastrointestinal: Negative for abdominal  pain, diarrhea, nausea and vomiting.  Genitourinary: Negative for dysuria and frequency.  Musculoskeletal: Positive for back pain (rt lower back). Negative for arthralgias.       +bilateral lower leg tingling and discomfort  Skin: Negative for rash.  Neurological: Negative for dizziness, weakness and headaches.       +bilateral lower leg parasthesia      Vitals BP 138/70    Pulse 86    Temp (!) 95.9 F (35.5 C)    Ht _0  (1.626 m)    Wt 103 lb 6.4 oz (46.9 kg)    SpO2 95%    BMI 17.75 kg/m   Objective:   Physical Exam Vitals and nursing note reviewed.  Constitutional:      General: She is not in acute distress.    Appearance: Normal appearance. She is not ill-appearing.  HENT:     Head: Normocephalic and atraumatic.     Nose: Nose normal.     Mouth/Throat:     Mouth: Mucous membranes are moist.     Pharynx: Oropharynx is clear.  Eyes:     Extraocular Movements: Extraocular movements intact.     Conjunctiva/sclera: Conjunctivae normal.     Pupils: Pupils are equal, round, and reactive to light.  Cardiovascular:     Rate and Rhythm: Normal rate and regular rhythm.     Pulses: Normal pulses.     Heart sounds: Normal heart sounds.  Pulmonary:     Effort: Pulmonary effort is normal.     Breath sounds: Normal breath sounds. No wheezing, rhonchi or rales.  Musculoskeletal:        General: Tenderness present. No swelling, deformity or signs of injury. Normal range of motion.     Right lower leg: No edema.     Left lower leg: No edema.     Comments: +pain with rom of bilateral hips. Normal rom of bilateral knees.  +ttp over the PSIS on right. No rash, normal inspection.  Skin:    General: Skin is warm and dry.     Findings: No lesion or rash.  Neurological:     General: No focal deficit present.     Mental Status: She is alert and oriented to person, place, and time.     Cranial Nerves: No cranial nerve deficit.     Sensory: No sensory deficit.     Motor: No weakness.     Gait: Gait normal.  Psychiatric:        Mood and Affect: Mood normal.        Behavior: Behavior normal.      Assessment and Plan   1. Peripheral sensory neuropathy - CMP14+EGFR - CBC; Future - TSH - DG Lumbar Spine Complete; Future - Ambulatory referral to Neurology  2. SI (sacroiliac) pain - DG Lumbar Spine Complete; Future  3.  Laboratory tests ordered as part of a complete physical exam (CPE) - CMP14+EGFR - CBC; Future - Lipid panel - TSH  4. Screening for deficiency anemia - CBC; Future  5. Malnutrition of moderate degree (HCC) - CBC; Future - TSH  6. Screening for thyroid disorder - TSH  7. Screening for lipid disorders - Lipid panel  8. Screening for diabetes mellitus - CMP14+EGFR   Pt requesting labs, ordered today and pt to f/u in 3-4 wks for medicare wellness visit and review labs. Xray of lumbar area ordered today. Cont with tylenol/aleve prn.  HM- due to mammo and tetanus booster on next visit.  F/u 3-4  wks for review of labs, HM, and medicare wellness.

## 2019-09-05 ENCOUNTER — Other Ambulatory Visit: Payer: Self-pay

## 2019-09-05 ENCOUNTER — Ambulatory Visit (HOSPITAL_COMMUNITY)
Admission: RE | Admit: 2019-09-05 | Discharge: 2019-09-05 | Disposition: A | Discharge status: Home / Self Care | Payer: Medicare Other | Source: Ambulatory Visit | Attending: Family Medicine | Admitting: Family Medicine

## 2019-09-05 DIAGNOSIS — G608 Other hereditary and idiopathic neuropathies: Secondary | ICD-10-CM

## 2019-09-05 DIAGNOSIS — E440 Moderate protein-calorie malnutrition: Secondary | ICD-10-CM | POA: Diagnosis not present

## 2019-09-05 DIAGNOSIS — M533 Sacrococcygeal disorders, not elsewhere classified: Secondary | ICD-10-CM | POA: Diagnosis not present

## 2019-09-05 DIAGNOSIS — Z1329 Encounter for screening for other suspected endocrine disorder: Secondary | ICD-10-CM | POA: Diagnosis not present

## 2019-09-05 DIAGNOSIS — M545 Low back pain: Secondary | ICD-10-CM | POA: Diagnosis not present

## 2019-09-05 DIAGNOSIS — Z0000 Encounter for general adult medical examination without abnormal findings: Secondary | ICD-10-CM | POA: Diagnosis not present

## 2019-09-06 LAB — CMP14+EGFR
ALT: 20 IU/L (ref 0–32)
AST: 30 IU/L (ref 0–40)
Albumin/Globulin Ratio: 1.7 (ref 1.2–2.2)
Albumin: 4.5 g/dL (ref 3.7–4.7)
Alkaline Phosphatase: 106 IU/L (ref 48–121)
BUN/Creatinine Ratio: 23 (ref 12–28)
BUN: 15 mg/dL (ref 8–27)
Bilirubin Total: 0.5 mg/dL (ref 0.0–1.2)
CO2: 23 mmol/L (ref 20–29)
Calcium: 9.1 mg/dL (ref 8.7–10.3)
Chloride: 99 mmol/L (ref 96–106)
Creatinine, Ser: 0.65 mg/dL (ref 0.57–1.00)
GFR calc Af Amer: 102 mL/min/{1.73_m2} (ref >59)
GFR calc non Af Amer: 88 mL/min/{1.73_m2} (ref >59)
Globulin, Total: 2.6 g/dL (ref 1.5–4.5)
Glucose: 86 mg/dL (ref 65–99)
Potassium: 4.5 mmol/L (ref 3.5–5.2)
Sodium: 138 mmol/L (ref 134–144)
Total Protein: 7.1 g/dL (ref 6.0–8.5)

## 2019-09-06 LAB — LIPID PANEL
Chol/HDL Ratio: 2.5 ratio (ref 0.0–4.4)
Cholesterol, Total: 172 mg/dL (ref 100–199)
HDL: 69 mg/dL (ref >39)
LDL Chol Calc (NIH): 90 mg/dL (ref 0–99)
Triglycerides: 68 mg/dL (ref 0–149)
VLDL Cholesterol Cal: 13 mg/dL (ref 5–40)

## 2019-09-06 LAB — TSH: TSH: 2.85 u[IU]/mL (ref 0.450–4.500)

## 2019-10-01 ENCOUNTER — Ambulatory Visit (INDEPENDENT_AMBULATORY_CARE_PROVIDER_SITE_OTHER): Payer: Medicare Other | Admitting: Family Medicine

## 2019-10-01 ENCOUNTER — Other Ambulatory Visit: Payer: Self-pay

## 2019-10-01 ENCOUNTER — Encounter: Payer: Self-pay | Admitting: Family Medicine

## 2019-10-01 VITALS — BP 132/74 | HR 75 | Temp 97.5°F | Ht 62.5 in | Wt 102.4 lb

## 2019-10-01 DIAGNOSIS — G608 Other hereditary and idiopathic neuropathies: Secondary | ICD-10-CM

## 2019-10-01 DIAGNOSIS — Z0000 Encounter for general adult medical examination without abnormal findings: Secondary | ICD-10-CM

## 2019-10-01 NOTE — Progress Notes (Signed)
Patient ID: Mary [name], female    DOB: 01-10-1946, 74 y.o.   MRN: [id number]   Chief Complaint  Patient presents with  . Medicare Wellness   Subjective:    HPI AWV- Annual Wellness Visit  The patient was seen for their annual wellness visit. The patient's past medical history, surgical history, and family history were reviewed. Pertinent vaccines were reviewed ( tetanus, pneumonia, shingles, flu) The patient's medication list was reviewed and updated.  The height and weight were entered.  BMI recorded in electronic record elsewhere  Cognitive screening was completed. Outcome of Mini - Cog: passed clock; remember 2 out of 3 words. Pt states "banana, sunrise and I dont remember the other because I have short term memory loss.    Falls /depression screening electronically recorded within record elsewhere  Current tobacco usage:none (All patients who use tobacco were given written and verbal information on quitting)  Recent listing of emergency department/hospitalizations over the past year were reviewed.  current specialist the patient sees on a regular basis: sees gynecology for female exams , Pulmonary   Medicare annual wellness visit patient questionnaire was reviewed.  A written screening schedule for the patient for the next 5-10 years was given. Appropriate discussion of followup regarding next visit was discussed.   Peripheral neuropathy- pt not wanting surgery, injections.  Wanting to see neuro about her neuropathy before trying a medication for it.  Hasn't changed, feels tingling from knees down to feet. Not taking any meds for it.  Medical History Mary [name] has a past medical history of Allergic rhinitis, Arthritis, Bronchiectasis, Chronic cough, COPD (chronic obstructive pulmonary disease) ([place]), Diverticulosis, GERD (gastroesophageal reflux disease), History of bladder infections, History of MRSA infection, Joint pain, Mycobacterium avium complex ([place]),  Osteoporosis, Osteoporosis, Pneumonia (1990's), PONV (postoperative nausea and vomiting), and Zenker's diverticulum.   Outpatient Encounter Medications as of 10/01/2019  Medication Sig  . albuterol (PROVENTIL) (2.5 MG/3ML) 0.083% nebulizer solution Take 3 mLs (2.5 mg total) by nebulization every 6 (six) hours as needed for wheezing or shortness of breath.  . budesonide (PULMICORT) 0.5 MG/2ML nebulizer solution Take 2 mLs (0.5 mg total) by nebulization daily.  Marland Kitchen conjugated estrogens (PREMARIN) vaginal cream Premarin 0.625 mg/gram vaginal cream three times week  . desloratadine (CLARINEX) 5 MG tablet Take 5 mg by mouth daily.  Marland Kitchen levocetirizine (XYZAL) 5 MG tablet TAKE 1 TABLET BY MOUTH EVERY EVENING  . PRESCRIPTION MEDICATION nasocort  . sodium chloride HYPERTONIC 3 % nebulizer solution INHALE 3ML VIA NEBULIZER TWICE DAILY  . Spacer/Aero-Holding Chambers (AEROCHAMBER MV) inhaler Use as instructed  . [DISCONTINUED] amoxicillin-clavulanate (AUGMENTIN) 875-125 MG tablet Take 1 tablet by mouth 2 (two) times daily.  . [DISCONTINUED] Azelastine HCl 0.15 % SOLN Place 2 sprays into both nostrils daily.  . [DISCONTINUED] clarithromycin (BIAXIN) 250 MG tablet Take 1 tablet (250 mg total) by mouth 2 (two) times daily.   No facility-administered encounter medications on file as of 10/01/2019.     Review of Systems  Constitutional: Negative for chills and fever.  HENT: Negative for congestion, rhinorrhea and sore throat.   Respiratory: Negative for cough, shortness of breath and wheezing.   Cardiovascular: Negative for chest pain and leg swelling.  Gastrointestinal: Negative for abdominal pain, diarrhea, nausea and vomiting.  Genitourinary: Negative for dysuria and frequency.  Musculoskeletal: Negative for arthralgias and back pain.  Skin: Negative for rash.  Neurological: Positive for numbness (chronic bilateral lower legs). Negative for dizziness, weakness and headaches.     Vitals  BP 132/74    Pulse 75   Temp (!) 97.5 F (36.4 C)   Ht 5' 2.5" (1.588 m)   Wt 102 lb 6.4 oz (46.4 kg)   SpO2 95%   BMI 18.43 kg/m   Objective:   Physical Exam Vitals and nursing note reviewed.  Constitutional:      General: She is not in acute distress.    Appearance: Normal appearance.  HENT:     Head: Normocephalic and atraumatic.     Right Ear: Tympanic membrane, ear canal and external ear normal.     Left Ear: Tympanic membrane, ear canal and external ear normal.     Nose: Nose normal. No congestion.     Mouth/Throat:     Mouth: Mucous membranes are moist.     Pharynx: Oropharynx is clear. No oropharyngeal exudate or posterior oropharyngeal erythema.  Eyes:     Extraocular Movements: Extraocular movements intact.     Conjunctiva/sclera: Conjunctivae normal.     Pupils: Pupils are equal, round, and reactive to light.  Cardiovascular:     Rate and Rhythm: Normal rate and regular rhythm.     Pulses: Normal pulses.     Heart sounds: Normal heart sounds. No murmur heard.   Pulmonary:     Effort: Pulmonary effort is normal.     Breath sounds: Normal breath sounds. No wheezing, rhonchi or rales.  Musculoskeletal:        General: Normal range of motion.     Right lower leg: No edema.     Left lower leg: No edema.  Skin:    General: Skin is warm and dry.     Findings: No lesion or rash.  Neurological:     General: No focal deficit present.     Mental Status: She is alert and oriented to person, place, and time.     Cranial Nerves: No cranial nerve deficit.     Motor: No weakness.     Gait: Gait normal.  Psychiatric:        Mood and Affect: Mood normal.        Behavior: Behavior normal.     Assessment and Plan   1. Medicare annual wellness visit, subsequent  2. Peripheral sensory neuropathy   HM- Pt needing tetanus booster, however not covered by insurance in office.  Will give info for health dept.  Pt to f/u with pulmonology as scheduled.   Also f/u with neuro about  peripheral neuropathy.  Pt not wanting to try gabapentin at this time, would like to wait till seen by neuro.  F/u in [place] prn.

## 2019-10-05 ENCOUNTER — Encounter: Payer: Self-pay | Admitting: Family Medicine

## 2019-11-26 ENCOUNTER — Other Ambulatory Visit: Payer: Self-pay

## 2019-11-26 ENCOUNTER — Encounter: Payer: Self-pay | Admitting: Emergency Medicine

## 2019-11-26 ENCOUNTER — Ambulatory Visit (INDEPENDENT_AMBULATORY_CARE_PROVIDER_SITE_OTHER): Payer: Medicare Other | Admitting: Emergency Medicine

## 2019-11-26 DIAGNOSIS — J479 Bronchiectasis, uncomplicated: Secondary | ICD-10-CM | POA: Diagnosis not present

## 2019-11-26 DIAGNOSIS — R059 Cough, unspecified: Secondary | ICD-10-CM

## 2019-11-26 DIAGNOSIS — R05 Cough: Secondary | ICD-10-CM

## 2019-11-26 MED ORDER — AMOXICILLIN-POT CLAVULANATE 875-125 MG PO TABS
1.0000 | ORAL_TABLET | Freq: Two times a day (BID) | ORAL | 5 refills | Status: DC
Start: 2019-11-26 — End: 2020-09-27

## 2019-11-26 MED ORDER — CLARITHROMYCIN 250 MG PO TABS
250.0000 mg | ORAL_TABLET | Freq: Two times a day (BID) | ORAL | 5 refills | Status: DC
Start: 2019-11-26 — End: 2020-09-27

## 2019-11-26 NOTE — Progress Notes (Signed)
Patient ID: Mary [name], female    DOB: 1945-05-06, 74 y.o.   MRN: [id number] HPI  ROV 05/16/19 --follow-up visit for 74 year old woman with chronic cough, both upper airway irritation (allergic rhinitis, GERD) and also bronchiectasis with Mycobacterium avium colonization, MRSA and Proteus as well.  We have tried to aggressively treat rhinitis with Xyzal, Allegra, Nasacort, not currently on GERD therapy (never benefited) I treated her with clarithromycin 2 months ago - she did improve, less  Cough and mucous. Now getting back to her usual cough burden. She has low energy, exertional SOB. She reports that she still does her chest vest, saline nebs on a schedule.  Her last CT scan of the chest was done 11/09/2017.  Chest x-ray 02/10/2019 was stable.   ROV 07/21/19 --this is a follow-up visit for chronic cough due to upper airway irritation and also significant bronchiectasis with Mycobacterium avium colonization.  Has also had a history of MRSA and Proteus in the past.  At her last visit we committed to alternating antibiotics every other month, Augmentin and clarithromycin. Also added Pulmicort nebs.  She reports today that she is about at baseline. She is trying to avoid allergens - is on xyzal, and Dr Gary Fleet just changed her allegra to an alternative, added a nasal spray. Still does nasal saline rinses qd. She continues to do chest vest. Uses albuterol nebs every morning. Has albuterol HFA, uses a few times a week.  ROV 11/26/19 --[name] is 69, has significant bronchiectasis with Mycobacterium avium colonization (not eradicated), history of MRSA and Proteus as well in the past.  Also with chronic dry cough due to upper airway irritation.  We have been rotating clarithromycin and Augmentin.  She uses the chest vest once a day, Allegra-D, saline rinses.  Her nebulizer regimen includes Pulmicort twice a day, saline nebs.  She uses albuterol every morning and as needed through the day.     Vitals:    11/26/19 1432  BP: 140/72  Pulse: 89  Temp: 97.6 F (36.4 C)  SpO2: 93%  Weight: 103 lb (46.7 kg)  Height: 5\' 4"  (1.626 m)   Gen: Pleasant, Thin woman, in no distress,  normal affect  ENT: No lesions,  mouth clear,  oropharynx clear, no postnasal drip  Neck: No JVD, no stridor  Lungs: No use of accessory muscles, no wheezes or crackles  Cardiovascular: RRR, heart sounds normal, no murmur or gallops, no peripheral edema  Musculoskeletal: No deformities, no cyanosis or clubbing  Neuro: alert, non focal  Skin: Warm, no lesions or rashes     BRONCHIECTASIS Please continue to rotate your antibiotics Augmentin and clarithromycin as directed and the beginning of each month Continue your Pulmicort nebulizer every morning.  You could extend this to twice a day if you felt that it helped to manage cough and mucus clearance Continue your saline nebulizer every morning Continue your chest vest.  You might want to consider changing this to every other evening to see if this frequency is adequate to clear your mucus Keep albuterol available to use as you need it for shortness of breath, cough, mucus clearance We will hold off on getting a CT scan of the chest right now. Follow with Dr in 6 months or sooner if you have any problems  COUGH, CHRONIC She also has an upper airway component to her cough, had a Zenker's diverticulum repaired in the past, still feels that she may be experiencing some reflux.  We will restart omeprazole  to see if this benefits her, may uptitrate going forward depending on results.  Also continue to manage her allergic rhinitis.  Continue your Allegra as you have been taking it Start omeprazole 20 mg once daily.  Take this medication 1 hour around food     Levy Pupa, MD, PhD 11/26/2019, 3:09 PM [facility] [phone] or if no answer [phone]

## 2019-11-26 NOTE — Assessment & Plan Note (Signed)
She also has an upper airway component to her cough, had a Zenker's diverticulum repaired in the past, still feels that she may be experiencing some reflux.  We will restart omeprazole to see if this benefits her, may uptitrate going forward depending on results.  Also continue to manage her allergic rhinitis.  Continue your Allegra as you have been taking it Start omeprazole 20 mg once daily.  Take this medication 1 hour around food

## 2019-11-26 NOTE — Assessment & Plan Note (Signed)
Please continue to rotate your antibiotics Augmentin and clarithromycin as directed and the beginning of each month Continue your Pulmicort nebulizer every morning.  You could extend this to twice a day if you felt that it helped to manage cough and mucus clearance Continue your saline nebulizer every morning Continue your chest vest.  You might want to consider changing this to every other evening to see if this frequency is adequate to clear your mucus Keep albuterol available to use as you need it for shortness of breath, cough, mucus clearance We will hold off on getting a CT scan of the chest right now. Follow with Dr Delton Coombes in 6 months or sooner if you have any problems

## 2019-11-26 NOTE — Patient Instructions (Signed)
Please continue to rotate your antibiotics Augmentin and clarithromycin as directed and the beginning of each month Continue your Pulmicort nebulizer every morning.  You could extend this to twice a day if you felt that it helped to manage cough and mucus clearance Continue your saline nebulizer every morning Continue your chest vest.  You might want to consider changing this to every other evening to see if this frequency is adequate to clear your mucus Keep albuterol available to use as you need it for shortness of breath, cough, mucus clearance Continue your Allegra as you have been taking it Start omeprazole 20 mg once daily.  Take this medication 1 hour around food We will hold off on getting a CT scan of the chest right now. Follow with Dr Delton Coombes in 6 months or sooner if you have any problems

## 2019-12-15 ENCOUNTER — Ambulatory Visit: Payer: Medicare Other | Admitting: Neurology

## 2019-12-26 ENCOUNTER — Encounter: Payer: Self-pay | Admitting: Neurology

## 2019-12-26 ENCOUNTER — Ambulatory Visit (INDEPENDENT_AMBULATORY_CARE_PROVIDER_SITE_OTHER): Payer: Medicare Other | Admitting: Neurology

## 2019-12-26 ENCOUNTER — Other Ambulatory Visit: Payer: Self-pay

## 2019-12-26 VITALS — BP 152/80 | HR 79 | Ht 64.0 in | Wt 104.0 lb

## 2019-12-26 DIAGNOSIS — G608 Other hereditary and idiopathic neuropathies: Secondary | ICD-10-CM

## 2019-12-26 MED ORDER — GABAPENTIN 100 MG PO CAPS: ORAL_CAPSULE | ORAL | 3 refills | Status: DC

## 2019-12-26 NOTE — Progress Notes (Signed)
[facility] Clinic Note - Initial Visit   Date: 12/26/19  Mary [name] MRN: [id number] DOB: February 28, 1946   Dear Dr. Lovena Le:  Thank you for your kind referral of Mary [name] for consultation of neuropathy. Although her history is well known to you, please allow [place] to reiterate it for the purpose of our medical record. The patient was accompanied to the clinic by self.    History of Present Illness: Mary [name] is a 74 y.o. right-handed female with GERD, allergies, and osteoporosis presenting for evaluation of neuropathy.  She was last seen here in 2016 for the same complaints.  Starting around 2014, she began having numbness involving the feet, which has become more constant and intense over the past few years.  She also has sharp pain in the lower legs, which is worse at nighttime.  She also has restless sensation in the legs, rarely gets up to walk, but often moves them around.  She endorses imbalance, walks unassisted and has not suffered any falls.   Prior labs including vitamin B12, TSH, HbA1c, glucose tolerance testing, copper, and SPEP with IFE has been normal.   Out-side paper records, electronic medical record, and images have been reviewed where available and summarized as:  Lab Results  Component Value Date   HGBA1C 5.6 02/20/2018   Lab Results  Component Value Date   VITAMINB12 650 02/20/2018   Lab Results  Component Value Date   TSH 2.850 09/05/2019     Past Medical History:  Diagnosis Date  . Allergic rhinitis    using Nasocort daily and Allegra daily  . Arthritis    "hands" (08/01/2013)  . Bronchiectasis    uses Albuterol daily as needed but has been a while since used(more than a month ago),  . Chronic cough   . COPD (chronic obstructive pulmonary disease) ([place])   . Diverticulosis   . GERD (gastroesophageal reflux disease)    takes Prilosec daily  . History of bladder infections    takes Keflex  daily;Dr.Ottin is Dealer  . History of MRSA infection    several yrs ago  . Joint pain   . Mycobacterium avium complex ([place])   . Osteoporosis   . Osteoporosis    takes Fosamax weekly  . Pneumonia 1990's  . PONV (postoperative nausea and vomiting)   . Zenker's diverticulum     Past Surgical History:  Procedure Laterality Date  . ANTERIOR CERVICAL DECOMP/DISCECTOMY FUSION  ~ 1992  . BREAST BIOPSY Left   . COLONOSCOPY    . COLONOSCOPY N/A 09/16/2015   Procedure: COLONOSCOPY;  Surgeon: Daneil Dolin, MD;  Location: [facility];  Service: Endoscopy;  Laterality: N/A;  11:00 Am  . ESOPHAGOGASTRODUODENOSCOPY    . VIDEO BRONCHOSCOPY Bilateral 10/13/2016   Procedure: VIDEO BRONCHOSCOPY WITHOUT FLUORO;  Surgeon: Collene Gobble, MD;  Location: [facility];  Service: Cardiopulmonary;  Laterality: Bilateral;  . ZENKER'S DIVERTICULECTOMY ENDOSCOPIC  08/01/2013     Medications:  Outpatient Encounter Medications as of 12/26/2019  Medication Sig  . albuterol (PROVENTIL) (2.5 MG/3ML) 0.083% nebulizer solution Take 3 mLs (2.5 mg total) by nebulization every 6 (six) hours as needed for wheezing or shortness of breath. (Patient taking differently: Inhale 2.5 mg into the lungs every 6 (six) hours as needed for wheezing or shortness of breath. )  . budesonide (PULMICORT) 0.5 MG/2ML nebulizer solution Take 2 mLs (0.5 mg total) by nebulization daily.  Marland Kitchen conjugated estrogens (PREMARIN) vaginal cream Premarin 0.625 mg/gram vaginal  cream three times week  . fexofenadine-pseudoephedrine (ALLEGRA-D) 60-120 MG 12 hr tablet Take 1 tablet by mouth 2 (two) times daily.  Marland Kitchen PRESCRIPTION MEDICATION nasocort  . sodium chloride HYPERTONIC 3 % nebulizer solution INHALE 3ML VIA NEBULIZER TWICE DAILY  . amoxicillin-clavulanate (AUGMENTIN) 875-125 MG tablet Take 1 tablet by mouth 2 (two) times daily. (Patient not taking: Reported on 12/26/2019)  . clarithromycin (BIAXIN) 250 MG tablet Take 1 tablet (250 mg total) by  mouth 2 (two) times daily. (Patient not taking: Reported on 12/26/2019)  . gabapentin (NEURONTIN) 100 MG capsule Take 1 tablet at bedtime x 1 week, then increase to 2 tablets at bedtime  . Spacer/Aero-Holding Chambers (AEROCHAMBER MV) inhaler Use as instructed (Patient not taking: Reported on 12/26/2019)   No facility-administered encounter medications on file as of 12/26/2019.    Allergies:  Allergies  Allergen Reactions  . Levaquin [Levofloxacin]     Hip pain  . Rifampin   . Sulfamethoxazole-Trimethoprim     REACTION: Rash  . Doxycycline Rash    Splotches on skin    Family History: Family History  Problem Relation Age of Onset  . Allergies Mother   . Heart disease Mother   . Heart disease Father   . Stroke Father     Social History: Social History   Tobacco Use  . Smoking status: Never Smoker  . Smokeless tobacco: Never Used  Vaping Use  . Vaping Use: Never used  Substance Use Topics  . Alcohol use: Yes    Comment: Occasional wine with meal  . Drug use: No   Social History   Social History Narrative   Right Handed   Lives in one story home    Drinks caffeine     Vital Signs:  BP (!) 152/80   Pulse 79   Ht _0  (1.626 m)   Wt 104 lb (47.2 kg)   SpO2 97%   BMI 17.85 kg/m   Neurological Exam: MENTAL STATUS including orientation to time, place, person, recent and remote memory, attention span and concentration, language, and fund of knowledge is normal.  Speech is not dysarthric.  CRANIAL NERVES: II:  No visual field defects.     III-IV-VI: Pupils equal round and reactive.  Normal conjugate, extra-ocular eye movements in all directions of gaze.  No nystagmus.  No ptosis.   VII:  Normal facial symmetry and movements.   VIII:  Normal hearing and vestibular function.   IX-X:  Normal palatal movement.   XI:  Normal shoulder shrug and head rotation.   XII:  Normal tongue strength and range of motion, no deviation or fasciculation.  MOTOR:  No atrophy,  fasciculations or abnormal movements.  No pronator drift.   Upper Extremity:  Right  Left  Deltoid  5/5   5/5   Biceps  5/5   5/5   Triceps  5/5   5/5   Infraspinatus 5/5  5/5  Medial pectoralis 5/5  5/5  Wrist extensors  5/5   5/5   Wrist flexors  5/5   5/5   Finger extensors  5/5   5/5   Finger flexors  5/5   5/5   Dorsal interossei  5/5   5/5   Abductor pollicis  5/5   5/5   Tone (Ashworth scale)  0  0   Lower Extremity:  Right  Left  Hip flexors  5/5   5/5   Hip extensors  5/5   5/5   Adductor 5/5  5/5  Abductor 5/5  5/5  Knee flexors  5/5   5/5   Knee extensors  5/5   5/5   Dorsiflexors  5/5   5/5   Plantarflexors  5/5   5/5   Toe extensors  5/5   5/5   Toe flexors  5/5   5/5   Tone (Ashworth scale)  0  0   MSRs:  Right        Left                  brachioradialis 2+  2+  biceps 2+  2+  triceps 2+  2+  patellar 2+  2+  ankle jerk 0  0  Hoffman no  no  plantar response down  down   SENSORY:  Reduced vibration at the great toe bilaterally, reduced pin prick over the mid foot bilaterally, temperature intact throughout.  Rhomberg sign is positive.  COORDINATION/GAIT: Normal finger-to- nose-finger.  Intact rapid alternating movements bilaterally.  Able to rise from a chair without using arms.  Gait narrow based and stable. Tandem and stressed gait intact.    IMPRESSION: Peripheral neuropathy manifesting with paresthesias in the lower legs/feet and sensory ataxia.  Her neurological examination shows a distal predominant large fiber peripheral neuropathy, which has progressed since her last evaluation in 2016. I had extensive discussion with the patient regarding the pathogenesis, etiology, management, and natural course of neuropathy. Neuropathy tends to be slowly progressive, especially if a treatable etiology is not identified. 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   Management is symptomatic.  She may also have an overlap of restless leg syndrome, hopefully she will get benefit from a trial of gabapentin.    PLAN/RECOMMENDATIONS:  Start PT for gait training Start gabapentin 124m at bedtime x 1 week, then 2083mat bedtime.  Titrate further as needed NCS/EMG only if symptoms progress Patient educated on daily foot inspection, fall prevention, and safety precautions around the home.   Return to clinic in 4 months.   Thank you for allowing me to participate in patient's care.  If I can answer any additional questions, I would be pleased to do so.    Sincerely,    Kati Riggenbach K. PaPosey ProntoDO

## 2019-12-26 NOTE — Patient Instructions (Addendum)
Start out-patient physical therapy for leg strengthening and balance therapy  Start gabapentin 100mg  (1 tablet) at bedtime for one week, then increase to 200mg  (2 tablets) at bedtime  Return to clinic in 4 months

## 2020-01-05 DIAGNOSIS — R208 Other disturbances of skin sensation: Secondary | ICD-10-CM | POA: Diagnosis not present

## 2020-01-05 DIAGNOSIS — G9009 Other idiopathic peripheral autonomic neuropathy: Secondary | ICD-10-CM | POA: Diagnosis not present

## 2020-01-05 DIAGNOSIS — M1990 Unspecified osteoarthritis, unspecified site: Secondary | ICD-10-CM | POA: Diagnosis not present

## 2020-01-05 DIAGNOSIS — R262 Difficulty in walking, not elsewhere classified: Secondary | ICD-10-CM | POA: Diagnosis not present

## 2020-01-05 DIAGNOSIS — M6281 Muscle weakness (generalized): Secondary | ICD-10-CM | POA: Diagnosis not present

## 2020-01-05 DIAGNOSIS — M810 Age-related osteoporosis without current pathological fracture: Secondary | ICD-10-CM | POA: Diagnosis not present

## 2020-01-09 DIAGNOSIS — M810 Age-related osteoporosis without current pathological fracture: Secondary | ICD-10-CM | POA: Diagnosis not present

## 2020-01-09 DIAGNOSIS — G9009 Other idiopathic peripheral autonomic neuropathy: Secondary | ICD-10-CM | POA: Diagnosis not present

## 2020-01-09 DIAGNOSIS — M1990 Unspecified osteoarthritis, unspecified site: Secondary | ICD-10-CM | POA: Diagnosis not present

## 2020-01-09 DIAGNOSIS — M6281 Muscle weakness (generalized): Secondary | ICD-10-CM | POA: Diagnosis not present

## 2020-01-09 DIAGNOSIS — R208 Other disturbances of skin sensation: Secondary | ICD-10-CM | POA: Diagnosis not present

## 2020-01-09 DIAGNOSIS — R262 Difficulty in walking, not elsewhere classified: Secondary | ICD-10-CM | POA: Diagnosis not present

## 2020-01-12 DIAGNOSIS — G9009 Other idiopathic peripheral autonomic neuropathy: Secondary | ICD-10-CM | POA: Diagnosis not present

## 2020-01-12 DIAGNOSIS — R208 Other disturbances of skin sensation: Secondary | ICD-10-CM | POA: Diagnosis not present

## 2020-01-12 DIAGNOSIS — M810 Age-related osteoporosis without current pathological fracture: Secondary | ICD-10-CM | POA: Diagnosis not present

## 2020-01-12 DIAGNOSIS — M1990 Unspecified osteoarthritis, unspecified site: Secondary | ICD-10-CM | POA: Diagnosis not present

## 2020-01-12 DIAGNOSIS — M6281 Muscle weakness (generalized): Secondary | ICD-10-CM | POA: Diagnosis not present

## 2020-01-12 DIAGNOSIS — R262 Difficulty in walking, not elsewhere classified: Secondary | ICD-10-CM | POA: Diagnosis not present

## 2020-01-16 ENCOUNTER — Telehealth: Payer: Self-pay | Admitting: Family Medicine

## 2020-01-16 MED ORDER — ALBUTEROL SULFATE HFA 108 (90 BASE) MCG/ACT IN AERS
2.0000 | INHALATION_SPRAY | Freq: Four times a day (QID) | RESPIRATORY_TRACT | 1 refills | Status: DC | PRN
Start: 2020-01-16 — End: 2020-10-26

## 2020-01-16 NOTE — Telephone Encounter (Signed)
Pt contacted and verbalized understanding.  

## 2020-01-16 NOTE — Telephone Encounter (Signed)
Pt contacted office and would like refill on Ventolin inhaler. Pt has Brochiectasis and uses the Ventolin inhaler when the neb treatments do not help all the way. Pt she uses the inhaler on an as needed basis. Please advise. Thank you (Ventolin is on historical med list)  [facility]

## 2020-01-19 ENCOUNTER — Telehealth: Payer: Self-pay | Admitting: *Deleted

## 2020-01-19 DIAGNOSIS — M6281 Muscle weakness (generalized): Secondary | ICD-10-CM | POA: Diagnosis not present

## 2020-01-19 DIAGNOSIS — R208 Other disturbances of skin sensation: Secondary | ICD-10-CM | POA: Diagnosis not present

## 2020-01-19 DIAGNOSIS — R262 Difficulty in walking, not elsewhere classified: Secondary | ICD-10-CM | POA: Diagnosis not present

## 2020-01-19 DIAGNOSIS — M1990 Unspecified osteoarthritis, unspecified site: Secondary | ICD-10-CM | POA: Diagnosis not present

## 2020-01-19 DIAGNOSIS — G9009 Other idiopathic peripheral autonomic neuropathy: Secondary | ICD-10-CM | POA: Diagnosis not present

## 2020-01-19 DIAGNOSIS — M810 Age-related osteoporosis without current pathological fracture: Secondary | ICD-10-CM | POA: Diagnosis not present

## 2020-01-19 NOTE — Telephone Encounter (Signed)
Patient would like to have consent form mailed to her to release information to husband.

## 2020-01-23 DIAGNOSIS — M6281 Muscle weakness (generalized): Secondary | ICD-10-CM | POA: Diagnosis not present

## 2020-01-23 DIAGNOSIS — R208 Other disturbances of skin sensation: Secondary | ICD-10-CM | POA: Diagnosis not present

## 2020-01-23 DIAGNOSIS — G9009 Other idiopathic peripheral autonomic neuropathy: Secondary | ICD-10-CM | POA: Diagnosis not present

## 2020-01-23 DIAGNOSIS — R262 Difficulty in walking, not elsewhere classified: Secondary | ICD-10-CM | POA: Diagnosis not present

## 2020-01-23 DIAGNOSIS — M810 Age-related osteoporosis without current pathological fracture: Secondary | ICD-10-CM | POA: Diagnosis not present

## 2020-01-23 DIAGNOSIS — M1990 Unspecified osteoarthritis, unspecified site: Secondary | ICD-10-CM | POA: Diagnosis not present

## 2020-02-16 DIAGNOSIS — Z23 Encounter for immunization: Secondary | ICD-10-CM | POA: Diagnosis not present

## 2020-03-05 DIAGNOSIS — Z23 Encounter for immunization: Secondary | ICD-10-CM | POA: Diagnosis not present

## 2020-04-26 ENCOUNTER — Ambulatory Visit (INDEPENDENT_AMBULATORY_CARE_PROVIDER_SITE_OTHER): Payer: Medicare Other | Admitting: Neurology

## 2020-04-26 ENCOUNTER — Other Ambulatory Visit: Payer: Self-pay

## 2020-04-26 ENCOUNTER — Encounter: Payer: Self-pay | Admitting: Neurology

## 2020-04-26 VITALS — BP 137/88 | HR 90 | Ht 64.0 in | Wt 104.0 lb

## 2020-04-26 DIAGNOSIS — G608 Other hereditary and idiopathic neuropathies: Secondary | ICD-10-CM

## 2020-04-26 NOTE — Progress Notes (Signed)
Follow-up Visit   Date: 04/26/20   VERTIS BAUDER MRN: [id number] DOB: 14-Dec-1945   Interim History: Sheral Apley Anne s a 75 y.o. right-handed Caucasian female returning to the clinic for follow-up of neuropathy.  The patient was accompanied to the clinic by self.  There has been no significant change in neuropathy since the last visit.  She continues to have stinging, burning, and numbness in the feet.  She did not start gabapentin due to concern of pulmonary side effects. Neuropathy is irritating about 75% of the time. She completed PT and home exercises for her balance.  She also complains of bilateral hip pain, especially upon initiation of standing or walking.   Medications:  Current Outpatient Medications on File Prior to Visit  Medication Sig Dispense Refill  . albuterol (PROVENTIL) (2.5 MG/3ML) 0.083% nebulizer solution Take 3 mLs (2.5 mg total) by nebulization every 6 (six) hours as needed for wheezing or shortness of breath. (Patient taking differently: Inhale 2.5 mg into the lungs every 6 (six) hours as needed for wheezing or shortness of breath.) 150 mL 5  . albuterol (VENTOLIN HFA) 108 (90 Base) MCG/ACT inhaler Inhale 2 puffs into the lungs every 6 (six) hours as needed for wheezing or shortness of breath. 8 g 1  . budesonide (PULMICORT) 0.5 MG/2ML nebulizer solution Take 2 mLs (0.5 mg total) by nebulization daily. 60 mL 12  . conjugated estrogens (PREMARIN) vaginal cream Premarin 0.625 mg/gram vaginal cream three times week    . fexofenadine-pseudoephedrine (ALLEGRA-D) 60-120 MG 12 hr tablet Take 1 tablet by mouth 2 (two) times daily.    Marland Kitchen PRESCRIPTION MEDICATION nasocort    . sodium chloride HYPERTONIC 3 % nebulizer solution INHALE VIA NEBULIZER TWICE DAILY 240 mL 5  . amoxicillin-clavulanate (AUGMENTIN) 875-125 MG tablet Take 1 tablet by mouth 2 (two) times daily. (Patient not taking: No sig reported) 14 tablet 5  . clarithromycin (BIAXIN) 250 MG tablet  Take 1 tablet (250 mg total) by mouth 2 (two) times daily. (Patient not taking: No sig reported) 14 tablet 5  . gabapentin (NEURONTIN) 100 MG capsule Take 1 tablet at bedtime x 1 week, then increase to 2 tablets at bedtime (Patient not taking: Reported on 04/26/2020) 60 capsule 3  . Spacer/Aero-Holding Chambers (AEROCHAMBER MV) inhaler Use as instructed (Patient not taking: No sig reported) 1 each 0   No current facility-administered medications on file prior to visit.    Allergies:  Allergies  Allergen Reactions  . Levaquin [Levofloxacin]     Hip pain  . Rifampin   . Sulfamethoxazole-Trimethoprim     REACTION: Rash  . Doxycycline Rash    Splotches on skin    Vital Signs:  BP 137/88   Pulse 90   Ht 5\' 4"  (1.626 m)   Wt 104 lb (47.2 kg)   SpO2 94%   BMI 17.85 kg/m   Neurological Exam: MENTAL STATUS including orientation to time, place, person, recent and remote memory, attention span and concentration, language, and fund of knowledge is normal.  Speech is not dysarthric.  CRANIAL NERVES:  Normal conjugate, extra-ocular eye movements in all directions of gaze.  No ptosis.    MOTOR:  Motor strength is 5/5 in all extremities, including distally.  No atrophy, fasciculations or abnormal movements.  No pronator drift.  Tone is normal.    MSRs:  Reflexes are 2+/4 throughout, except absent at the ankles.  SENSORY:  Vibration reduced below the ankles bilaterally.  COORDINATION/GAIT:  Normal  finger-to- nose-finger.  Intact rapid alternating movements bilaterally.  Gait narrow based and stable.   Data: n/a  IMPRESSION/PLAN: Idiopathic peripheral neuropathy with painful paresthesias  - Start gabapentin 100mg  at bedtime x 1 week then increase th 200mg  at bedtime  - If no improvement, titrate higher  - Continue home PT exercises  Bilateral hip pain  - follow-up with PCP  Return to clinic in 6 months.    Thank you for allowing me to participate in patient's care.  If I can  answer any additional questions, I would be pleased to do so.    Sincerely,    Gianfranco Araki K. , DO

## 2020-04-26 NOTE — Patient Instructions (Signed)
Start gabapentin 100mg  at bedtime x 1 week, then increase at 200mg  at bedtime  Return to clinic 6 months

## 2020-05-05 ENCOUNTER — Other Ambulatory Visit: Payer: Self-pay

## 2020-05-05 ENCOUNTER — Encounter: Payer: Self-pay | Admitting: Emergency Medicine

## 2020-05-05 ENCOUNTER — Ambulatory Visit: Payer: Medicare Other | Admitting: Emergency Medicine

## 2020-05-05 DIAGNOSIS — J301 Allergic rhinitis due to pollen: Secondary | ICD-10-CM

## 2020-05-05 DIAGNOSIS — K225 Diverticulum of esophagus, acquired: Secondary | ICD-10-CM

## 2020-05-05 DIAGNOSIS — R059 Cough, unspecified: Secondary | ICD-10-CM | POA: Diagnosis not present

## 2020-05-05 DIAGNOSIS — J479 Bronchiectasis, uncomplicated: Secondary | ICD-10-CM

## 2020-05-05 NOTE — Assessment & Plan Note (Signed)
We will continue your rotating antibiotics, clarithromycin and Augmentin at the beginning of every other month. Continue to use your chest vest once daily. Continue your hypertonic saline nebulizer treatments as you have been doing Continue albuterol nebulizer every morning and as needed We will not restart Pulmicort nebulizer at this time COVID-19 vaccine up-to-date Follow with Dr Delton Coombes in 6 months or sooner if you have any problems

## 2020-05-05 NOTE — Patient Instructions (Signed)
We will continue your rotating antibiotics, clarithromycin and Augmentin at the beginning of every other month. Continue to use your chest vest once daily. Please continue your Allegra-D, Nasacort and nasal saline rinses as you have been doing them. Continue your hypertonic saline nebulizer treatments as you have been doing Continue albuterol nebulizer every morning and as needed We will not restart Pulmicort nebulizer at this time Agree with stopping omeprazole Agree with continuing gabapentin if you are able to tolerate. COVID-19 vaccine up-to-date Follow with Dr Delton Coombes in 6 months or sooner if you have any problems

## 2020-05-05 NOTE — Assessment & Plan Note (Signed)
Attempting to manage all of her chronic contributors.  There may be some secondary gain, benefit with cough control from her Neurontin.

## 2020-05-05 NOTE — Progress Notes (Signed)
Patient ID: Mary [name], female    DOB: Aug 03, 1945, 75 y.o.   MRN: [id number] HPI  ROV 07/21/19 --this is a follow-up visit for chronic cough due to upper airway irritation and also significant bronchiectasis with Mycobacterium avium colonization.  Has also had a history of MRSA and Proteus in the past.  At her last visit we committed to alternating antibiotics every other month, Augmentin and clarithromycin. Also added Pulmicort nebs.  She reports today that she is about at baseline. She is trying to avoid allergens - is on xyzal, and Dr Gary Fleet just changed her allegra to an alternative, added a nasal spray. Still does nasal saline rinses qd. She continues to do chest vest. Uses albuterol nebs every morning. Has albuterol HFA, uses a few times a week.  ROV 11/26/19 --Mary [name] is 29, has significant bronchiectasis with Mycobacterium avium colonization (not eradicated), history of MRSA and Proteus as well in the past.  Also with chronic dry cough due to upper airway irritation.  We have been rotating clarithromycin and Augmentin.  She uses the chest vest once a day, Allegra-D, saline rinses.  Her nebulizer regimen includes Pulmicort twice a day, saline nebs.  She uses albuterol every morning and as needed through the day.   ROV 05/05/20 --75 year old woman with significant bronchiectasis and colonization with Mycobacterium avium.  Also with a history of Proteus, MRSA pneumonias/bronchiectasis in the past.  Chronic cough in the setting of this as well as a Zenker's diverticulum, GERD, allergic rhinitis.  She is on rotating clarithromycin and Augmentin.  Currently using her chest vest approximately once a day.  She is on Allegra-D, nasacort and nasal saline rinses.  Last visit we restarted omeprazole to see if she would get any benefit. Inhaled regimen > hypertonic saline nebs, albuterol as needed. Stopped Pulmicort nebs.  Today she reports that she has been seeing neurology regarding peripheral  neuropathy that is impacting her ability to ambulate, associated with some imbalance.  There is some question of possible superimposed restless leg syndrome and she was started on gabapentin. Her cough is about the same - she didn't notice any changes with a trial omeprazole. Her mucous production from her sinuses was a lot higher during the Fall, is improving  MDM: Reviewed neurology note from 12/26/2019   Vitals:   05/05/20 1329  BP: 116/68  Pulse: 83  Temp: 97.6 F (36.4 C)  SpO2: 97%  Weight: 106 lb 12.8 oz (48.4 kg)  Height: 5' 4.5" (1.638 m)   Gen: Pleasant, Thin woman, in no distress,  normal affect  ENT: No lesions,  mouth clear,  oropharynx clear, no postnasal drip  Neck: No JVD, no stridor  Lungs: No use of accessory muscles, no wheezes or crackles  Cardiovascular: RRR, heart sounds normal, no murmur or gallops, no peripheral edema  Musculoskeletal: No deformities, no cyanosis or clubbing  Neuro: alert, non focal  Skin: Warm, no lesions or rash    BRONCHIECTASIS We will continue your rotating antibiotics, clarithromycin and Augmentin at the beginning of every other month. Continue to use your chest vest once daily. Continue your hypertonic saline nebulizer treatments as you have been doing Continue albuterol nebulizer every morning and as needed We will not restart Pulmicort nebulizer at this time COVID-19 vaccine up-to-date Follow with Dr Delton Coombes in 6 months or sooner if you have any problems  Allergic rhinitis due to pollen  Please continue your Allegra-D, Nasacort and nasal saline rinses as you have been doing them.  Zenker's  diverticulum Stop omeprazole, didn't impact her cough  COUGH, CHRONIC Attempting to manage all of her chronic contributors.  There may be some secondary gain, benefit with cough control from her Neurontin.    Mary Pupa, MD, PhD 05/05/2020, 1:47 PM [facility] [phone] or if no answer [phone]

## 2020-05-05 NOTE — Assessment & Plan Note (Signed)
Stop omeprazole, didn't impact her cough

## 2020-05-05 NOTE — Assessment & Plan Note (Signed)
  Please continue your Allegra-D, Nasacort and nasal saline rinses as you have been doing them.

## 2020-05-14 ENCOUNTER — Other Ambulatory Visit: Payer: Self-pay | Admitting: Emergency Medicine

## 2020-06-16 ENCOUNTER — Other Ambulatory Visit: Payer: Self-pay | Admitting: Emergency Medicine

## 2020-06-16 NOTE — Telephone Encounter (Signed)
callled [facility] to confirm receipts of rx sent last month. Nothing further needed.

## 2020-07-21 DIAGNOSIS — J301 Allergic rhinitis due to pollen: Secondary | ICD-10-CM | POA: Diagnosis not present

## 2020-07-21 DIAGNOSIS — J3089 Other allergic rhinitis: Secondary | ICD-10-CM | POA: Diagnosis not present

## 2020-07-21 DIAGNOSIS — J310 Chronic rhinitis: Secondary | ICD-10-CM | POA: Diagnosis not present

## 2020-07-21 DIAGNOSIS — J479 Bronchiectasis, uncomplicated: Secondary | ICD-10-CM | POA: Diagnosis not present

## 2020-08-11 ENCOUNTER — Other Ambulatory Visit: Payer: Self-pay | Admitting: Emergency Medicine

## 2020-08-11 ENCOUNTER — Other Ambulatory Visit: Payer: Self-pay

## 2020-08-14 ENCOUNTER — Other Ambulatory Visit: Payer: Self-pay | Admitting: Neurology

## 2020-08-23 DIAGNOSIS — R309 Painful micturition, unspecified: Secondary | ICD-10-CM | POA: Diagnosis not present

## 2020-08-23 DIAGNOSIS — Z681 Body mass index (BMI) 19 or less, adult: Secondary | ICD-10-CM | POA: Diagnosis not present

## 2020-08-23 DIAGNOSIS — Z01419 Encounter for gynecological examination (general) (routine) without abnormal findings: Secondary | ICD-10-CM | POA: Diagnosis not present

## 2020-08-23 DIAGNOSIS — Z1231 Encounter for screening mammogram for malignant neoplasm of breast: Secondary | ICD-10-CM | POA: Diagnosis not present

## 2020-08-23 DIAGNOSIS — Z124 Encounter for screening for malignant neoplasm of cervix: Secondary | ICD-10-CM | POA: Diagnosis not present

## 2020-09-08 DIAGNOSIS — R1314 Dysphagia, pharyngoesophageal phase: Secondary | ICD-10-CM | POA: Diagnosis not present

## 2020-09-08 DIAGNOSIS — K219 Gastro-esophageal reflux disease without esophagitis: Secondary | ICD-10-CM | POA: Diagnosis not present

## 2020-09-08 DIAGNOSIS — R059 Cough, unspecified: Secondary | ICD-10-CM | POA: Diagnosis not present

## 2020-09-20 ENCOUNTER — Telehealth: Payer: Self-pay | Admitting: Emergency Medicine

## 2020-09-20 MED ORDER — CEFUROXIME AXETIL 500 MG PO TABS: 500.0000 mg | ORAL_TABLET | Freq: Two times a day (BID) | ORAL | 0 refills | Status: DC

## 2020-09-20 NOTE — Telephone Encounter (Signed)
Called and spoke with Patient.  Dr. Kavin Leech instructions given.  Understanding stated.  Cefuroxime prescription sent to requested [facility]. Patient scheduled 09/27/20 with Maralyn Sago, NP.  Nothing further at this time.

## 2020-09-20 NOTE — Telephone Encounter (Signed)
She is allergic to some of the abx we would choose to treat an acute flare of her bronchiectasis. Will have her stop the clarithro, start cefuroxime 500mg  bid x 5 days. Have her go ahead and start this.   Would offer her an OV with APP to be evaluated and to have a CXR. This week.

## 2020-09-20 NOTE — Telephone Encounter (Signed)
Called and spoke with patient. She stated that she has been coughing up thick brownish, greenish phlegm for the past week. She is already on a rotating schedule of Augmentin and Biaxin. She started the Biaxin a little sooner than scheduled due to the cough. She started the Biaxin on 09/17/20.   She denied any increased SOB she has heard some rattling in her chest, especially at night when lying down. Denied any fevers or body aches.   Pharmacy is [facility].   RB, can you please advise? Thanks.

## 2020-09-20 NOTE — Telephone Encounter (Signed)
Pt states that her coughing is getting much worse; states that she is coughing up phlegm/mucus (states that it is brown, green, or pink). Pharmacy; [facility] - [place], [place] - [address] regard; [phone]. Pt states that she is currently on clarithromycin (BIAXIN) 250 MG tablet  but she does not believe it is doing her any good.

## 2020-09-25 ENCOUNTER — Other Ambulatory Visit: Payer: Self-pay | Admitting: Emergency Medicine

## 2020-09-27 ENCOUNTER — Ambulatory Visit (INDEPENDENT_AMBULATORY_CARE_PROVIDER_SITE_OTHER): Payer: Medicare Other

## 2020-09-27 ENCOUNTER — Ambulatory Visit (INDEPENDENT_AMBULATORY_CARE_PROVIDER_SITE_OTHER): Payer: Medicare Other | Admitting: Acute Care

## 2020-09-27 ENCOUNTER — Other Ambulatory Visit: Payer: Self-pay

## 2020-09-27 ENCOUNTER — Encounter: Payer: Self-pay | Admitting: Acute Care

## 2020-09-27 VITALS — BP 92/62 | HR 77 | Temp 97.5°F | Ht 63.11 in | Wt 102.5 lb

## 2020-09-27 DIAGNOSIS — R059 Cough, unspecified: Secondary | ICD-10-CM | POA: Diagnosis not present

## 2020-09-27 NOTE — Progress Notes (Signed)
History of Present Illness Mary [name] is a 75 y.o. female never smoker with bronchiectasis and colonization with Mycobacterium avium.  Also with a history of Proteus, MRSA pneumonias/bronchiectasis in the past.  Chronic cough in the setting of this as well as a Zenker's diverticulum, GERD, allergic rhinitis.  She is on rotating clarithromycin and Augmentin.. She is followed by Dr. Lamonte Sakai.   09/27/2020 Pt. Presents for follow up after treatment with cefuroxime 585m bid x 5 days.She had developed a flare of her bronchiectasis. She was coughing with every breath. She states her cough is better ( back to her baseline) after treatment with cefuroxime. Sputum color is light yellow. She is using her nebulizer treatments. She uses her hypertonic saline daily in the morning. She is using her chest vest every night . She states this is working well for her. NO significant increase in secretions. She denies any fever, chest pain, orthopnea. She feels she is back to her baseline.Very thankful to be feeling better.   Test Results: 6/27 CXR>> pending   CBC Latest Ref Rng & Units 09/28/2016 08/05/2013 07/23/2013  WBC 4.0 - 10.5 K/uL 10.0 5.6 7.1  Hemoglobin 12.0 - 15.0 g/dL 14.8 13.6 14.2  Hematocrit 36.0 - 46.0 % 44.1 40.8 42.1  Platelets 150.0 - 400.0 K/uL 250.0 250 238    BMP Latest Ref Rng & Units 09/05/2019 02/20/2018 09/28/2016  Glucose 65 - 99 mg/dL 86 166(H) 123(H)  BUN 8 - 27 mg/dL 15 - 14  Creatinine 0.57 - 1.00 mg/dL 0.65 - 0.73  BUN/Creat Ratio 12 - 28 23 - -  Sodium 134 - 144 mmol/L 138 - 140  Potassium 3.5 - 5.2 mmol/L 4.5 - 4.6  Chloride 96 - 106 mmol/L 99 - 101  CO2 20 - 29 mmol/L 23 - 29  Calcium 8.7 - 10.3 mg/dL 9.1 - 9.3    BNP No results found for: BNP  ProBNP No results found for: PROBNP  PFT No results found for: FEV1PRE, FEV1POST, FVCPRE, FVCPOST, TLC, DLCOUNC, PREFEV1FVCRT, PSTFEV1FVCRT  No results found.   Past medical hx Past Medical History:  Diagnosis  Date   Allergic rhinitis    using Nasocort daily and Allegra daily   Arthritis    "hands" (08/01/2013)   Bronchiectasis    uses Albuterol daily as needed but has been a while since used(more than a month ago),   Chronic cough    [place]D [place] disease) [place]    Diverticulosis    GERD (gastroesophageal reflux disease)    takes Prilosec daily   History of bladder infections    takes Keflex daily;Dr.Ottin is Urologist   History of MRSA infection    several yrs ago   Joint pain    Myc[place] avium [place]    Osteoporosis    Osteoporosis    takes Fosamax weekly   Pneumonia 1990's   PONV (postoperative nausea and vomiting)    Zenker's diverticulum      Social History   Tobacco Use   Smoking status: Never   Smokeless tobacco: Never  Vaping Use   Vaping Use: Never used  Substance Use Topics   Alcohol use: Yes    Comment: Occasional wine with meal   Drug use: No    [name] reports that she has never smoked. She has never used smokeless tobacco. She reports current alcohol use. She reports that she does not use drugs.  Tobacco Cessation: Never smoker   Past surgical hx, Family hx, Social hx  all reviewed.  Current Outpatient Medications on File Prior to Visit  Medication Sig   albuterol (PROVENTIL) (2.5 MG/3ML) 0.083% nebulizer solution Take 3 mLs (2.5 mg total) by nebulization every 6 (six) hours as needed for wheezing or shortness of breath.   albuterol (VENTOLIN HFA) 108 (90 Base) MCG/ACT inhaler Inhale 2 puffs into the lungs every 6 (six) hours as needed for wheezing or shortness of breath.   amoxicillin-clavulanate (AUGMENTIN) 875-125 MG tablet Take 1 tablet by mouth 2 (two) times daily.   clarithromycin (BIAXIN) 250 MG tablet Take 1 tablet (250 mg total) by mouth 2 (two) times daily.   conjugated estrogens (PREMARIN) vaginal cream Premarin 0.625 mg/gram vaginal cream three times week   fexofenadine-pseudoephedrine (ALLEGRA-D) 60-120  MG 12 hr tablet Take 1 tablet by mouth 2 (two) times daily.   gabapentin (NEURONTIN) 100 MG capsule TAKE ONE CAPSULE BY MOUTH AT BEDTIME FOR ONE WEEK, THEN INCREASE TO TWO CAPSULES AT BEDTIME   PRESCRIPTION MEDICATION nasocort   sodium chloride HYPERTONIC 3 % nebulizer solution INHALE 3ML VIA NEBULIZER TWICE DAILY   Spacer/Aero-Holding Chambers (AEROCHAMBER MV) inhaler Use as instructed   budesonide (PULMICORT) 0.5 MG/2ML nebulizer solution Take 2 mLs (0.5 mg total) by nebulization daily. (Patient not taking: Reported on 09/27/2020)   cefUROXime (CEFTIN) 500 MG tablet Take 1 tablet (500 mg total) by mouth 2 (two) times daily with a meal. (Patient not taking: Reported on 09/27/2020)   No current facility-administered medications on file prior to visit.     Allergies  Allergen Reactions   Levaquin [Levofloxacin]     Hip pain   Rifampin    Sulfamethoxazole-Trimethoprim     REACTION: Rash   Doxycycline Rash    Splotches on skin    Review Of Systems:  Constitutional:   No  weight loss, night sweats,  Fevers, chills, fatigue, or  lassitude.  HEENT:   No headaches,  Difficulty swallowing,  Tooth/dental problems, or  Sore throat,                No sneezing, itching, ear ache, nasal congestion, post nasal drip,   CV:  No chest pain,  Orthopnea, PND, swelling in lower extremities, anasarca, dizziness, palpitations, syncope.   GI  No heartburn, indigestion, abdominal pain, nausea, vomiting, diarrhea, change in bowel habits, loss of appetite, bloody stools.   Resp: No shortness of breath with exertion or at rest.  Baseline excess mucus, + baseline productive cough,  No non-productive cough,  No coughing up of blood.  No change in color of mucus.  No wheezing.  No chest wall deformity  Skin: no rash or lesions.  GU: no dysuria, change in color of urine, no urgency or frequency.  No flank pain, no hematuria   MS:  No joint pain or swelling.  No decreased range of motion.  No back  pain.  Psych:  No change in mood or affect. No depression or anxiety.  No memory loss.   Vital Signs BP 92/62 (BP Location: Left Arm, Patient Position: Sitting, Cuff Size: Normal)   Pulse 77   Temp (!) 97.5 F (36.4 C) (Oral)   Ht 5' 3.11" (1.603 m)   Wt 102 lb 8 oz (46.5 kg)   SpO2 95%   BMI 18.09 kg/m    Physical Exam:  General- No distress,  A&Ox3, pleasant ENT: No sinus tenderness, TM clear, pale nasal mucosa, no oral exudate,no post nasal drip, no LAN Cardiac: S1, S2, regular rate and rhythm, no murmur Chest:  No wheeze/ rales/ dullness; no accessory muscle use, no nasal flaring, no sternal retractions, few crackles per bases Abd.: Soft Non-tender, ND, BS +, Body mass index is 18.09 kg/m.  Ext: No clubbing cyanosis, edema Neuro:  normal strength, MAE x 4, A&O x 3 Skin: No rashes, No lesions, warm and dry Psych: normal mood and behavior   Assessment/Plan Resolved Bronchiectasis Flare after treatment with cefuroxime 56m bid x 5 days Back to baseline  Plan We will do a CXR today. We will call you with results.  Continue using your hypertonic saline nebs in the morning, chest vest in the evening. Continue using your Allegra and Nasocort for allergies.  Start Augmentin as your regular therapy ( alternating with clarithromycin) the first of July. Continue Activia for probiotic coverage.  Follow up with Dr. BLamonte Sakaiin August as we discussed.  Please contact office for sooner follow up if symptoms do not improve or worsen or seek emergency care    I spent 30 minutes dedicated to the care of this patient on the date of this encounter to include pre-visit review of records, face-to-face time with the patient discussing conditions above, post visit ordering of testing, clinical documentation with the electronic health record, making appropriate referrals as documented, and communicating necessary information to the patient's healthcare team.    SMagdalen Spatz NP 09/27/2020   11:11 AM

## 2020-09-27 NOTE — Patient Instructions (Addendum)
It is good to see you today. We will do a CXR today. We will call you with results.  Continue using your hypertonic saline nebs in the morning, chest vest in the evening. Continue using your Allegra and Nasocort for allergies.  Start Augmentin as your regular therapy ( alternating with clarithromycin) the first of July. Continue Activia for probiotic coverage.  Follow up with Dr. Delton Coombes in August as we discussed.  Please contact office for sooner follow up if symptoms do not improve or worsen or seek emergency care

## 2020-09-28 NOTE — Progress Notes (Signed)
Amy, please call the patient and let her know she needs to use her chest vest and flutter valve . There is some new atelectasis in her right middle lobe. Have her call the office if she has any changes between now and when she is scheduled to see Dr.  Delton Coombes in August.   Dr. Delton Coombes. Looks like she has had some progression. She is better after the antibiotic and prednisone you sent in, but may need closer follow up. She has an appointment with you 8/10.

## 2020-09-28 NOTE — Progress Notes (Signed)
Let me know if you want her seen before 8/10. She says she feels she is back to baseline. I am asking her to use her IS and her chest vest more frequently. Thanks

## 2020-09-29 ENCOUNTER — Telehealth: Payer: Self-pay | Admitting: Acute Care

## 2020-09-29 DIAGNOSIS — H353132 Nonexudative age-related macular degeneration, bilateral, intermediate dry stage: Secondary | ICD-10-CM | POA: Diagnosis not present

## 2020-09-29 NOTE — Telephone Encounter (Signed)
I called and spoke with patient regarding CXR and Nicholes Calamity. Patient verbalized understanding and stated she is wearing chest vest but not using flutter valve as she doesn't think it helps her. She is feeling back at baseline so will keep August appt with RB. Patient will call if anything changes. Nothing further needed.

## 2020-09-29 NOTE — Telephone Encounter (Signed)
-----   Message from Bevelyn Ngo, NP sent at 09/28/2020  1:55 PM EDT ----- Jiraiya Mcewan, please call the patient and let her know she needs to use her chest vest and flutter valve . There is some new atelectasis in her right middle lobe. Have her call the office if she has any changes between now and when she is scheduled to see Dr.  Delton Coombes in August.   Dr. Delton Coombes. Looks like she has had some progression. She is better after the antibiotic and prednisone you sent in, but may need closer follow up. She has an appointment with you 8/10.

## 2020-10-15 ENCOUNTER — Other Ambulatory Visit: Payer: Self-pay | Admitting: Neurology

## 2020-10-24 ENCOUNTER — Other Ambulatory Visit: Payer: Self-pay | Admitting: Family Medicine

## 2020-10-25 ENCOUNTER — Ambulatory Visit: Payer: Medicare Other | Admitting: Neurology

## 2020-10-25 ENCOUNTER — Other Ambulatory Visit: Payer: Self-pay

## 2020-10-25 ENCOUNTER — Encounter: Payer: Self-pay | Admitting: Neurology

## 2020-10-25 VITALS — BP 103/66 | HR 76 | Ht 63.0 in | Wt 103.1 lb

## 2020-10-25 DIAGNOSIS — G608 Other hereditary and idiopathic neuropathies: Secondary | ICD-10-CM | POA: Diagnosis not present

## 2020-10-25 DIAGNOSIS — M4306 Spondylolysis, lumbar region: Secondary | ICD-10-CM | POA: Diagnosis not present

## 2020-10-25 MED ORDER — GABAPENTIN 100 MG PO CAPS: ORAL_CAPSULE | ORAL | 3 refills | Status: DC

## 2020-10-25 NOTE — Patient Instructions (Signed)
Start gabapentin 100mg  in the morning and 200mg  at bedtime.  OK to adjust to 300mg  at bedtime, if needed Continue home balance exercises  Return to clinic in 6 months

## 2020-10-25 NOTE — Progress Notes (Signed)
Follow-up Visit   Date: 10/25/20   Mary [name] MRN: [id number] DOB: 03/15/1946   Interim History: Mary [name] s a 75 y.o. right-handed Caucasian female returning to the clinic for follow-up of neuropathy.  The patient was accompanied to the clinic by self.  Her neuropathy remains unchanged.  She has noticed that it is worse in the evening.  She takes gabapentin 200mg  at bedtime, which has helped her night time symptoms.  She completed PT which has helped her balance.   She endorses chronic low back pain.  Pain is not worse with prolonged standing or sitting.  It is usually stiff when she initially gets moving.  No leg heaviness or weakness.   Medications:  Current Outpatient Medications on File Prior to Visit  Medication Sig Dispense Refill   albuterol (PROVENTIL) (2.5 MG/3ML) 0.083% nebulizer solution Take 3 mLs (2.5 mg total) by nebulization every 6 (six) hours as needed for wheezing or shortness of breath. 150 mL 5   albuterol (VENTOLIN HFA) 108 (90 Base) MCG/ACT inhaler Inhale 2 puffs into the lungs every 6 (six) hours as needed for wheezing or shortness of breath. 8 g 1   budesonide (PULMICORT) 0.5 MG/2ML nebulizer solution Take 2 mLs (0.5 mg total) by nebulization daily. 60 mL 12   cefUROXime (CEFTIN) 500 MG tablet Take 1 tablet (500 mg total) by mouth 2 (two) times daily with a meal. 10 tablet 0   clarithromycin (BIAXIN) 500 MG tablet TAKE 1/2 TABLET BY MOUTH TWICE DAILY 7 tablet 5   conjugated estrogens (PREMARIN) vaginal cream Premarin 0.625 mg/gram vaginal cream three times week     fexofenadine-pseudoephedrine (ALLEGRA-D) 60-120 MG 12 hr tablet Take 1 tablet by mouth 2 (two) times daily.     gabapentin (NEURONTIN) 100 MG capsule TAKE ONE CAPSULE BY MOUTH AT BEDTIME FOR ONE WEEK, THEN INCREASE TO TWO CAPSULES AT BEDTIME (Patient taking differently: Take 200 mg by mouth at bedtime. TAKE ONE CAPSULE BY MOUTH AT BEDTIME FOR ONE WEEK, THEN INCREASE TO TWO  CAPSULES AT BEDTIME) 60 capsule 1   PRESCRIPTION MEDICATION nasocort     sodium chloride HYPERTONIC 3 % nebulizer solution INHALE VIA NEBULIZER TWICE DAILY 240 mL 5   Spacer/Aero-Holding Chambers (AEROCHAMBER MV) inhaler Use as instructed 1 each 0   amoxicillin-clavulanate (AUGMENTIN) 875-125 MG tablet TAKE 1 TABLET BY MOUTH TWICE DAILY (Patient not taking: Reported on 10/25/2020) 14 tablet 5   No current facility-administered medications on file prior to visit.    Allergies:  Allergies  Allergen Reactions   Levaquin [Levofloxacin]     Hip pain   Rifampin    Sulfamethoxazole-Trimethoprim     REACTION: Rash   Doxycycline Rash    Splotches on skin    Vital Signs:  BP 103/66   Pulse 76   Ht 5\' 3"  (1.6 m)   Wt 103 lb 2 oz (46.8 kg)   SpO2 96%   BMI 18.27 kg/m   Neurological Exam: MENTAL STATUS including orientation to time, place, person, recent and remote memory, attention span and concentration, language, and fund of knowledge is normal.  Speech is not dysarthric.  CRANIAL NERVES:  Normal conjugate, extra-ocular eye movements in all directions of gaze.  No ptosis.    MOTOR:  Motor strength is 5/5 in all extremities, including distally.  No atrophy, fasciculations or abnormal movements.  No pronator drift.  Tone is normal.    MSRs:  Right        Left brachioradialis 2+  2+  biceps 2+  2+  triceps 2+  2+  patellar 3+  3+  ankle jerk 0  0   SENSORY:  Vibration reduced at the great toe bilaterally, worse on the left  COORDINATION/GAIT:    Intact rapid alternating movements bilaterally.  Gait narrow based and stable.   Data: n/a  IMPRESSION/PLAN: Idiopathic peripheral neuropathy with painful paresthesias  - Start gabapentin 100mg  in the morning and 200mg  at bedtime.  OK to adjust to 300mg  at bedtime, if needed  - Continue home balance exercises  2.  Lumbar spondylosis with possible canal stenosis  - PT declined  - If  symptoms get worse, MRI lumbar spine can be ordered  Return to clinic in 6 months  Thank you for allowing me to participate in patient's care.  If I can answer any additional questions, I would be pleased to do so.    Sincerely,    Marriana Hibberd K. , DO

## 2020-11-10 ENCOUNTER — Ambulatory Visit (INDEPENDENT_AMBULATORY_CARE_PROVIDER_SITE_OTHER): Payer: Medicare Other | Admitting: Emergency Medicine

## 2020-11-10 ENCOUNTER — Encounter: Payer: Self-pay | Admitting: Emergency Medicine

## 2020-11-10 ENCOUNTER — Other Ambulatory Visit: Payer: Self-pay

## 2020-11-10 DIAGNOSIS — J479 Bronchiectasis, uncomplicated: Secondary | ICD-10-CM | POA: Diagnosis not present

## 2020-11-10 NOTE — Progress Notes (Signed)
Patient ID: Mary [name], female    DOB: 1946-02-08, 75 y.o.   MRN: [id number] HPI  ROV 05/05/20 --75 year old woman with significant bronchiectasis and colonization with Mycobacterium avium.  Also with a history of Proteus, MRSA pneumonias/bronchiectasis in the past.  Chronic cough in the setting of this as well as a Zenker's diverticulum, GERD, allergic rhinitis.  She is on rotating clarithromycin and Augmentin.  Currently using her chest vest approximately once a day.  She is on Allegra-D, nasacort and nasal saline rinses.  Last visit we restarted omeprazole to see if she would get any benefit. Inhaled regimen > hypertonic saline nebs, albuterol as needed. Stopped Pulmicort nebs.  Today she reports that she has been seeing neurology regarding peripheral neuropathy that is impacting her ability to ambulate, associated with some imbalance.  There is some question of possible superimposed restless leg syndrome and she was started on gabapentin. Her cough is about the same - she didn't notice any changes with a trial omeprazole. Her mucous production from her sinuses was a lot higher during the Fall, is improving  ROV 11/10/20 --75 year old woman who follows up today for her history of bronchiectasis with colonization by Mycobacterium avium, Proteus, MRSA.  She has chronic cough due to this as well as a Zenker's diverticulum, GERD and allergic rhinitis.  She is on rotating antibiotics with clarithromycin and Augmentin.  She had acute flare in June and had to be treated with 5 days of antibiotics prednisone. She was then dx w COVID in early July, had fatigue, no fever.  AllegraD, saline nebs qd - helping with her mucous clearance. Uses vest at night. Rotating abx as above. Rare albuterol use currently.    Vitals:   11/10/20 1428  BP: 112/68  Pulse: 88  Temp: 98.2 F (36.8 C)  TempSrc: Oral  SpO2: 96%  Weight: 103 lb 3.2 oz (46.8 kg)  Height: 5' 4.5" (1.638 m)   Gen: Pleasant, Thin woman, in no  distress,  normal affect  ENT: No lesions,  mouth clear,  oropharynx clear, no postnasal drip  Neck: No JVD, no stridor  Lungs: No use of accessory muscles, no wheezes or crackles  Cardiovascular: RRR, heart sounds normal, no murmur or gallops, no peripheral edema  Musculoskeletal: No deformities, no cyanosis or clubbing  Neuro: alert, non focal  Skin: Warm, no lesions or rash    BRONCHIECTASIS Please continue your saline nebulizer treatments, chest vest treatments as you have been doing them. Continue rotating clarithromycin and Augmentin for the first week of alternating months Keep your albuterol available to use either 2 puffs or 1 nebulizer treatment when needed for shortness of breath, chest tightness, wheezing, mucus clearance Call our office if you develop any increased cough, change in the color of your mucus or any other symptoms consistent with a flare of your bronchiectasis.  We will give you a respiratory sample container so that we can collect culture information if you get these symptoms. We will plan to repeat high-resolution CT scan of the chest at [facility] to compare with your priors. Follow with Dr Delton Coombes in 3 months or sooner if you have any problems    Levy Pupa, MD, PhD 11/10/2020, 3:18 PM [facility] [phone] or if no answer [phone]

## 2020-11-10 NOTE — Assessment & Plan Note (Signed)
Please continue your saline nebulizer treatments, chest vest treatments as you have been doing them. Continue rotating clarithromycin and Augmentin for the first week of alternating months Keep your albuterol available to use either 2 puffs or 1 nebulizer treatment when needed for shortness of breath, chest tightness, wheezing, mucus clearance Call our office if you develop any increased cough, change in the color of your mucus or any other symptoms consistent with a flare of your bronchiectasis.  We will give you a respiratory sample container so that we can collect culture information if you get these symptoms. We will plan to repeat high-resolution CT scan of the chest at [facility] to compare with your priors. Follow with Dr Delton Coombes in 3 months or sooner if you have any problems

## 2020-11-10 NOTE — Addendum Note (Signed)
Addended by: Dorisann Frames R on: 11/10/2020 04:23 PM   Modules accepted: Orders

## 2020-11-10 NOTE — Patient Instructions (Addendum)
Please continue your saline nebulizer treatments, chest vest treatments as you have been doing them. Continue rotating clarithromycin and Augmentin for the first week of alternating months Keep your albuterol available to use either 2 puffs or 1 nebulizer treatment when needed for shortness of breath, chest tightness, wheezing, mucus clearance Call our office if you develop any increased cough, change in the color of your mucus or any other symptoms consistent with a flare of your bronchiectasis.  We will give you a respiratory sample container so that we can collect culture information if you get these symptoms. We will plan to repeat high-resolution CT scan of the chest at [place] to compare with your priors. Follow with Dr Emmalee Solivan in 3 months or sooner if you have any problems 

## 2020-11-22 ENCOUNTER — Telehealth: Payer: Self-pay | Admitting: Family Medicine

## 2020-11-22 DIAGNOSIS — Z79899 Other long term (current) drug therapy: Secondary | ICD-10-CM

## 2020-11-22 DIAGNOSIS — Z13220 Encounter for screening for lipoid disorders: Secondary | ICD-10-CM

## 2020-11-22 DIAGNOSIS — Z0000 Encounter for general adult medical examination without abnormal findings: Secondary | ICD-10-CM

## 2020-11-22 NOTE — Telephone Encounter (Signed)
Last labs completed 09/05/19 TSH, Lipid and CMP14+EGFR. Please advise. Thank you

## 2020-11-22 NOTE — Telephone Encounter (Signed)
Patient has physical 9/9 and needing labs done

## 2020-11-23 NOTE — Telephone Encounter (Signed)
Lab orders placed; left message to return call  

## 2020-11-24 NOTE — Telephone Encounter (Signed)
Pt contacted and verbalized understanding.  

## 2020-11-29 DIAGNOSIS — Z0000 Encounter for general adult medical examination without abnormal findings: Secondary | ICD-10-CM | POA: Diagnosis not present

## 2020-11-29 DIAGNOSIS — Z79899 Other long term (current) drug therapy: Secondary | ICD-10-CM | POA: Diagnosis not present

## 2020-11-29 DIAGNOSIS — Z13220 Encounter for screening for lipoid disorders: Secondary | ICD-10-CM | POA: Diagnosis not present

## 2020-11-30 LAB — LIPID PANEL
Chol/HDL Ratio: 2.6 ratio (ref 0.0–4.4)
Cholesterol, Total: 174 mg/dL (ref 100–199)
HDL: 66 mg/dL (ref >39)
LDL Chol Calc (NIH): 93 mg/dL (ref 0–99)
Triglycerides: 83 mg/dL (ref 0–149)
VLDL Cholesterol Cal: 15 mg/dL (ref 5–40)

## 2020-11-30 LAB — CBC WITH DIFFERENTIAL/PLATELET
Basophils Absolute: 0.1 10*3/uL (ref 0.0–0.2)
Basos: 2 %
EOS (ABSOLUTE): 0.3 10*3/uL (ref 0.0–0.4)
Eos: 5 %
Hematocrit: 42.1 % (ref 34.0–46.6)
Hemoglobin: 14.6 g/dL (ref 11.1–15.9)
Immature Grans (Abs): 0 10*3/uL (ref 0.0–0.1)
Immature Granulocytes: 0 %
Lymphocytes Absolute: 1.7 10*3/uL (ref 0.7–3.1)
Lymphs: 34 %
MCH: 30 pg (ref 26.6–33.0)
MCHC: 34.7 g/dL (ref 31.5–35.7)
MCV: 86 fL (ref 79–97)
Monocytes Absolute: 0.4 10*3/uL (ref 0.1–0.9)
Monocytes: 9 %
Neutrophils Absolute: 2.6 10*3/uL (ref 1.4–7.0)
Neutrophils: 50 %
Platelets: 219 10*3/uL (ref 150–450)
RBC: 4.87 x10E6/uL (ref 3.77–5.28)
RDW: 11.7 % (ref 11.7–15.4)
WBC: 5.1 10*3/uL (ref 3.4–10.8)

## 2020-11-30 LAB — COMPREHENSIVE METABOLIC PANEL
ALT: 19 IU/L (ref 0–32)
AST: 32 IU/L (ref 0–40)
Albumin/Globulin Ratio: 2 (ref 1.2–2.2)
Albumin: 4.3 g/dL (ref 3.7–4.7)
Alkaline Phosphatase: 105 IU/L (ref 44–121)
BUN/Creatinine Ratio: 17 (ref 12–28)
BUN: 11 mg/dL (ref 8–27)
Bilirubin Total: 0.5 mg/dL (ref 0.0–1.2)
CO2: 23 mmol/L (ref 20–29)
Calcium: 9.2 mg/dL (ref 8.7–10.3)
Chloride: 99 mmol/L (ref 96–106)
Creatinine, Ser: 0.63 mg/dL (ref 0.57–1.00)
Globulin, Total: 2.2 g/dL (ref 1.5–4.5)
Glucose: 86 mg/dL (ref 65–99)
Potassium: 4.6 mmol/L (ref 3.5–5.2)
Sodium: 136 mmol/L (ref 134–144)
Total Protein: 6.5 g/dL (ref 6.0–8.5)
eGFR: 92 mL/min/{1.73_m2} (ref >59)

## 2020-12-10 ENCOUNTER — Telehealth: Payer: Self-pay | Admitting: Family Medicine

## 2020-12-10 ENCOUNTER — Encounter: Payer: Self-pay | Admitting: Family Medicine

## 2020-12-10 ENCOUNTER — Ambulatory Visit (HOSPITAL_COMMUNITY)
Admission: RE | Admit: 2020-12-10 | Discharge: 2020-12-10 | Disposition: A | Discharge status: Home / Self Care | Payer: Medicare Other | Source: Ambulatory Visit | Attending: Emergency Medicine | Admitting: Emergency Medicine

## 2020-12-10 ENCOUNTER — Ambulatory Visit (INDEPENDENT_AMBULATORY_CARE_PROVIDER_SITE_OTHER): Payer: Medicare Other | Admitting: Family Medicine

## 2020-12-10 ENCOUNTER — Other Ambulatory Visit: Payer: Self-pay

## 2020-12-10 VITALS — BP 154/67 | HR 85 | Temp 97.2°F | Ht 62.5 in | Wt 104.2 lb

## 2020-12-10 DIAGNOSIS — M5136 Other intervertebral disc degeneration, lumbar region: Secondary | ICD-10-CM

## 2020-12-10 DIAGNOSIS — J479 Bronchiectasis, uncomplicated: Secondary | ICD-10-CM | POA: Diagnosis not present

## 2020-12-10 DIAGNOSIS — Z0000 Encounter for general adult medical examination without abnormal findings: Secondary | ICD-10-CM | POA: Diagnosis not present

## 2020-12-10 DIAGNOSIS — G608 Other hereditary and idiopathic neuropathies: Secondary | ICD-10-CM | POA: Diagnosis not present

## 2020-12-10 DIAGNOSIS — I700 Atherosclerosis of aorta: Secondary | ICD-10-CM | POA: Diagnosis not present

## 2020-12-10 NOTE — Telephone Encounter (Signed)
Patient had physical today and she would like her results of her labs.

## 2020-12-10 NOTE — Telephone Encounter (Signed)
Pt contacted and verbalized understanding.  

## 2020-12-10 NOTE — Telephone Encounter (Signed)
Please advise. Thank you

## 2020-12-10 NOTE — Progress Notes (Signed)
Patient ID: Mary [name], female    DOB: 1945/10/17, 75 y.o.   MRN: [id number]   Chief Complaint  Patient presents with   Annual Exam   Subjective:    HPI  AWV- Annual Wellness Visit  The patient was seen for their annual wellness visit. The patient's past medical history, surgical history, and family history were reviewed. Pertinent vaccines were reviewed ( tetanus, pneumonia, shingles, flu) The patient's medication list was reviewed and updated.  The height and weight were entered.  BMI recorded in electronic record elsewhere  Cognitive screening was completed. Outcome of Mini - Cog: pt completed clock drawing; when asked to remember 3 words pt states "we can cut to the chase, I do have some short term memory loss"- pt declining memory testing.   Falls /depression screening electronically recorded within record elsewhere  Current tobacco usage:none (All patients who use tobacco were given written and verbal information on quitting)  Recent listing of emergency department/hospitalizations over the past year were reviewed.  current specialist the patient sees on a regular basis: Pulmonology, Neurology; does see Urology but not on regular basis   Medicare annual wellness visit patient questionnaire was reviewed.  A written screening schedule for the patient for the next 5-10 years was given. Appropriate discussion of followup regarding next visit was discussed.   Seeing neuro- Dr. Posey Pronto and has peripheral neuropathy. Gabapentin for this. Taking it and helping at night. Tried to go from 247m to 3051m  Not doing well with 30075mfeeling dizziness during the day. Then went back to 200m90mLumbar back pain, and hard to walk, going on for over a year.  Working partime still. Taking aleve.  Doing well.  Pt wanting to wait to see neuro.  Bronchiectaisis- seeing pulm.  On abx monthly to help with this.  Pt had covid, had mild symptoms. In 7/22.  Has had  both covid vaccines and 1 booster.   Medical History [place] a past medical history of Allergic rhinitis, Arthritis, Bronchiectasis, Chronic cough, COPD (chronic obstructive pulmonary disease) (HCC)Pupukeaiverticulosis, GERD (gastroesophageal reflux disease), History of bladder infections, History of MRSA infection, Joint pain, Mycobacterium avium complex (HCC)Aquillasteoporosis, Osteoporosis, Pneumonia (1990's), PONV (postoperative nausea and vomiting), and Zenker's diverticulum.   Outpatient Encounter Medications as of 12/10/2020  Medication Sig   albuterol (PROVENTIL) (2.5 MG/3ML) 0.083% nebulizer solution Take 3 mLs (2.5 mg total) by nebulization every 6 (six) hours as needed for wheezing or shortness of breath.   albuterol (VENTOLIN HFA) 108 (90 Base) MCG/ACT inhaler inhale TWO puffs into THE lungs EVERY 6 HOURS AS NEEDED FOR wheezing OR SHORTNESS OF BREATH   amoxicillin-clavulanate (AUGMENTIN) 875-125 MG tablet TAKE 1 TABLET BY MOUTH TWICE DAILY   cefUROXime (CEFTIN) 500 MG tablet Take 1 tablet (500 mg total) by mouth 2 (two) times daily with a meal.   clarithromycin (BIAXIN) 500 MG tablet TAKE 1/2 TABLET BY MOUTH TWICE DAILY   conjugated estrogens (PREMARIN) vaginal cream Premarin 0.625 mg/gram vaginal cream three times week   fexofenadine-pseudoephedrine (ALLEGRA-D) 60-120 MG 12 hr tablet Take 1 tablet by mouth 2 (two) times daily.   gabapentin (NEURONTIN) 100 MG capsule Take 1 tablet in the morning and 2 tablets at bedtime.   PRESCRIPTION MEDICATION nasocort   sodium chloride HYPERTONIC 3 % nebulizer solution INHALE 3ML VIA NEBULIZER TWICE DAILY   Spacer/Aero-Holding Chambers (AEROCHAMBER MV) inhaler Use as instructed   No facility-administered encounter medications on file as of 12/10/2020.  Review of Systems  Constitutional:  Negative for chills and fever.  HENT:  Negative for congestion, rhinorrhea and sore throat.   Respiratory:  Negative for cough, shortness of breath and wheezing.    Cardiovascular:  Negative for chest pain and leg swelling.  Gastrointestinal:  Negative for abdominal pain, diarrhea, nausea and vomiting.  Genitourinary:  Negative for dysuria and frequency.  Musculoskeletal:  Positive for back pain (chronic). Negative for arthralgias.  Skin:  Negative for rash.  Neurological:  Negative for dizziness, weakness and headaches.    Vitals BP (!) 154/67   Pulse 85   Temp (!) 97.2 F (36.2 C)   Ht 5' 2.5" (1.588 m)   Wt 104 lb 3.2 oz (47.3 kg)   SpO2 98%   BMI 18.75 kg/m   Objective:   Physical Exam Vitals and nursing note reviewed.  Constitutional:      Appearance: Normal appearance.  HENT:     Head: Normocephalic and atraumatic.     Right Ear: Tympanic membrane normal.     Left Ear: Tympanic membrane normal.     Nose: Nose normal.     Mouth/Throat:     Mouth: Mucous membranes are moist.     Pharynx: Oropharynx is clear.  Eyes:     Extraocular Movements: Extraocular movements intact.     Conjunctiva/sclera: Conjunctivae normal.     Pupils: Pupils are equal, round, and reactive to light.  Cardiovascular:     Rate and Rhythm: Normal rate and regular rhythm.     Pulses: Normal pulses.     Heart sounds: Normal heart sounds.  Pulmonary:     Effort: Pulmonary effort is normal.     Breath sounds: Normal breath sounds. No wheezing, rhonchi or rales.  Musculoskeletal:        General: Normal range of motion.     Right lower leg: No edema.     Left lower leg: No edema.  Skin:    General: Skin is warm and dry.     Findings: No lesion or rash.  Neurological:     General: No focal deficit present.     Mental Status: She is alert and oriented to person, place, and time.  Psychiatric:        Mood and Affect: Mood normal.        Behavior: Behavior normal.     Assessment and Plan   1. Medicare annual wellness visit, subsequent  2. Degenerative disc disease, lumbar  3. Peripheral sensory neuropathy   Pt to get new shingles and tetanus  vaccine at pharmacy.  Also to get influenza and covid booster when available at pharmacy.  Neuropathy-stable.   Xrays in 2021- has deg disc disease.  Pt taking aleve and helping.  Declining seeing ortho at this time.  Wanting to f/u with her neurologist.  Elevated bp today, however, has been normal other visit.  Pt has some anxiety today about the visit.  Return in about 6 months (around 06/09/2021) for f/u blood pressure.   BP Readings from Last 3 Encounters:  12/10/20 (!) 154/67  11/10/20 112/68  10/25/20 103/66

## 2020-12-10 NOTE — Patient Instructions (Signed)
Needing- tetanus booster and shingles vaccine.

## 2020-12-15 DIAGNOSIS — R0982 Postnasal drip: Secondary | ICD-10-CM | POA: Diagnosis not present

## 2020-12-15 DIAGNOSIS — T7840XA Allergy, unspecified, initial encounter: Secondary | ICD-10-CM | POA: Diagnosis not present

## 2020-12-15 DIAGNOSIS — R1314 Dysphagia, pharyngoesophageal phase: Secondary | ICD-10-CM | POA: Diagnosis not present

## 2020-12-27 DIAGNOSIS — J342 Deviated nasal septum: Secondary | ICD-10-CM | POA: Diagnosis not present

## 2020-12-27 DIAGNOSIS — J3489 Other specified disorders of nose and nasal sinuses: Secondary | ICD-10-CM | POA: Diagnosis not present

## 2021-02-08 DIAGNOSIS — Z87440 Personal history of urinary (tract) infections: Secondary | ICD-10-CM | POA: Diagnosis not present

## 2021-02-08 DIAGNOSIS — N9489 Other specified conditions associated with female genital organs and menstrual cycle: Secondary | ICD-10-CM | POA: Diagnosis not present

## 2021-02-08 DIAGNOSIS — N3020 Other chronic cystitis without hematuria: Secondary | ICD-10-CM | POA: Diagnosis not present

## 2021-02-08 DIAGNOSIS — K5904 Chronic idiopathic constipation: Secondary | ICD-10-CM | POA: Diagnosis not present

## 2021-02-09 ENCOUNTER — Ambulatory Visit: Payer: Medicare Other | Admitting: Emergency Medicine

## 2021-02-09 ENCOUNTER — Other Ambulatory Visit: Payer: Self-pay

## 2021-02-09 ENCOUNTER — Encounter: Payer: Self-pay | Admitting: Emergency Medicine

## 2021-02-09 DIAGNOSIS — J479 Bronchiectasis, uncomplicated: Secondary | ICD-10-CM

## 2021-02-09 NOTE — Progress Notes (Signed)
Patient ID: Mary [name], female    DOB: [birth year], 75 y.o.   MRN: [id number] HPI  ROV 05/05/20 --75 year old woman with significant bronchiectasis and colonization with Mycobacterium avium.  Also with a history of Proteus, MRSA pneumonias/bronchiectasis in the past.  Chronic cough in the setting of this as well as a Zenker's diverticulum, GERD, allergic rhinitis.  She is on rotating clarithromycin and Augmentin.  Currently using her chest vest approximately once a day.  She is on Allegra-D, nasacort and nasal saline rinses.  Last visit we restarted omeprazole to see if she would get any benefit. Inhaled regimen > hypertonic saline nebs, albuterol as needed. Stopped Pulmicort nebs.  Today she reports that she has been seeing neurology regarding peripheral neuropathy that is impacting her ability to ambulate, associated with some imbalance.  There is some question of possible superimposed restless leg syndrome and she was started on gabapentin. Her cough is about the same - she didn't notice any changes with a trial omeprazole. Her mucous production from her sinuses was a lot higher during the Fall, is improving  ROV 11/10/20 --75 year old woman who follows up today for her history of bronchiectasis with colonization by Mycobacterium avium, Proteus, MRSA.  She has chronic cough due to this as well as a Zenker's diverticulum, GERD and allergic rhinitis.  She is on rotating antibiotics with clarithromycin and Augmentin.  She had acute flare in June and had to be treated with 5 days of antibiotics prednisone. She was then dx w COVID in early July, had fatigue, no fever.  AllegraD, saline nebs qd - helping with her mucous clearance. Uses vest at night. Rotating abx as above. Rare albuterol use currently.   ROV 02/09/21 --75 year old woman with a history of bronchiectasis and associated chronic cough.  She also has a Zenker's diverticulum with GERD and allergic rhinitis.  She is colonized with Mycobacterium  AVM, Proteus, MRSA.  She is on rotating antibiotics with clarithromycin and Augmentin.  She uses 3% saline nebs, Allegra, chest vest prn.  She has daily cough, much less mucous production. She is able to get it out. Some purulence - started her clarithro a few days early.  She is on allegra-d, nasacort  High-resolution CT scan of the chest 12/10/2020 reviewed by me shows no significant change in profound pulmonary parenchymal bronchiectasis with mucoid impaction and peribronchovascular nodularity.  Stable compared with 11/09/2017   Vitals:   02/09/21 1451  BP: 138/72  Pulse: 80  Temp: 98 F (36.7 C)  TempSrc: Oral  SpO2: 96%  Weight: 104 lb 9.6 oz (47.4 kg)  Height: 5\' 4"  (1.626 m)   Gen: Pleasant, Thin woman, in no distress,  normal affect  ENT: No lesions,  mouth clear,  oropharynx clear, no postnasal drip  Neck: No JVD, no stridor  Lungs: No use of accessory muscles, no wheezes or crackles  Cardiovascular: RRR, heart sounds normal, no murmur or gallops, no peripheral edema  Musculoskeletal: No deformities, no cyanosis or clubbing  Neuro: alert, non focal  Skin: Warm, no lesions or rash    BRONCHIECTASIS Stable clinically, stable radiographically.  Her mucous clearance routine has decreased some, she is using the saline nebs in the chest vest only as needed.  If she has increased purulent mucus then she will take a sample for microbiology.  Continue her rotating antibiotics.  We reviewed your CT scan of the chest today. We will plan to continue your rotating antibiotics at the beginning of each month: Clarithromycin, Augmentin Keep your  albuterol available to use either 2 puffs or 1 nebulizer treatment if he needed for shortness of breath, chest tightness, wheezing, coughing. Use your 3% saline nebulizer as needed to loosen mucus and help clear your mucus. Continue chest vest as needed to help clear mucus. Continue your Allegra-D as you have been taking it Continue your  Nasacort as you have been taking it If you begin to have more mucus, colored mucus production then collect a sample so we can send it for culture.  You can take this to the [facility] laboratory. Follow with Dr Delton Coombes in 6 months or sooner if you have any problems    Levy Pupa, MD, PhD 02/09/2021, 3:19 PM [facility] [phone] or if no answer [phone]

## 2021-02-09 NOTE — Assessment & Plan Note (Signed)
Stable clinically, stable radiographically.  Her mucous clearance routine has decreased some, she is using the saline nebs in the chest vest only as needed.  If she has increased purulent mucus then she will take a sample for microbiology.  Continue her rotating antibiotics.  We reviewed your CT scan of the chest today. We will plan to continue your rotating antibiotics at the beginning of each month: Clarithromycin, Augmentin Keep your albuterol available to use either 2 puffs or 1 nebulizer treatment if he needed for shortness of breath, chest tightness, wheezing, coughing. Use your 3% saline nebulizer as needed to loosen mucus and help clear your mucus. Continue chest vest as needed to help clear mucus. Continue your Allegra-D as you have been taking it Continue your Nasacort as you have been taking it If you begin to have more mucus, colored mucus production then collect a sample so we can send it for culture.  You can take this to the [facility] laboratory. Follow with Dr Delton Coombes in 6 months or sooner if you have any problems

## 2021-02-09 NOTE — Patient Instructions (Addendum)
We reviewed your CT scan of the chest today. We will plan to continue your rotating antibiotics at the beginning of each month: Clarithromycin, Augmentin Keep your albuterol available to use either 2 puffs or 1 nebulizer treatment if he needed for shortness of breath, chest tightness, wheezing, coughing. Use your 3% saline nebulizer as needed to loosen mucus and help clear your mucus. Continue chest vest as needed to help clear mucus. Continue your Allegra-D as you have been taking it Continue your Nasacort as you have been taking it If you begin to have more mucus, colored mucus production then collect a sample so we can send it for culture.  You can take this to the [facility] laboratory. Follow with Dr Delton Coombes in 6 months or sooner if you have any problems

## 2021-02-18 ENCOUNTER — Encounter: Payer: Self-pay | Admitting: Family Medicine

## 2021-02-18 ENCOUNTER — Ambulatory Visit (INDEPENDENT_AMBULATORY_CARE_PROVIDER_SITE_OTHER): Payer: Medicare Other | Admitting: Family Medicine

## 2021-02-18 ENCOUNTER — Other Ambulatory Visit: Payer: Self-pay

## 2021-02-18 VITALS — BP 139/86 | HR 75 | Ht 62.5 in | Wt 102.2 lb

## 2021-02-18 DIAGNOSIS — G608 Other hereditary and idiopathic neuropathies: Secondary | ICD-10-CM | POA: Insufficient documentation

## 2021-02-18 DIAGNOSIS — K219 Gastro-esophageal reflux disease without esophagitis: Secondary | ICD-10-CM | POA: Insufficient documentation

## 2021-02-18 DIAGNOSIS — M160 Bilateral primary osteoarthritis of hip: Secondary | ICD-10-CM

## 2021-02-18 MED ORDER — MELOXICAM 7.5 MG PO TABS
7.5000 mg | ORAL_TABLET | Freq: Every day | ORAL | 1 refills | Status: DC | PRN
Start: 2021-02-18 — End: 2021-05-12

## 2021-02-18 NOTE — Progress Notes (Addendum)
Subjective:  Patient ID: Mary [name], female    DOB: 03-Jul-1945  Age: 75 y.o. MRN: [id number]  CC: Chief Complaint  Patient presents with   Back Pain    And leg pain and weakness in both legs but the left one is the worse. Patient states it has been going on for a while and got some better but now it has gotten worse again    HPI:  75 year old female with peripheral neuropathy, bilateral hip osteoarthritis, lumbar degenerative disc disease, chronic cough, bronchiectasis, osteoporosis presents with back pain and hip pain.  Patient reports this has been going on intermittently over the past year.  She has previously been seen by orthopedics.  Has had imaging of both the lumbar spine as well as the hips.  She has significant degenerative changes.  X-rays were independently reviewed today.  Patient reports that she has had worsening pain recently.  Pain is primarily of the low back but also affects the hips.  She reports decreased range of motion of the hips.  Orthopedist previously recommended replacement.  Patient states that she is not ready for that.  She would like to discuss treatment options today.  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[place]) 12/06/2006   Bronchiectasis ([place]) 12/06/2006   Osteoporosis 12/06/2006    Social Hx   Social History   Socioeconomic History   Marital status: Married    Spouse name: Not on file   Number of children: 1   Years of education: Not on file   Highest education level: Not on file  Occupational History   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  Tobacco Use   Smoking status: Never   Smokeless tobacco: Never  Vaping Use   Vaping Use:  Never used  Substance and Sexual Activity   Alcohol use: Yes    Comment: Occasional wine with meal   Drug use: No   Sexual activity: Not Currently  Other Topics Concern   Not on file  Social History Narrative   Right Handed   Lives in one story home    Drinks caffeine    Social Determinants of Health   Financial Resource Strain: Not on file  Food Insecurity: Not on file  Transportation Needs: Not on file  Physical Activity: Not on file  Stress: Not on file  Social Connections: Not on file    Review of Systems Per HPI  Objective:  BP 139/86   Pulse 75   Ht 5' 2.5" (1.588 m)   Wt 102 lb 3.2 oz (46.4 kg)   BMI 18.39 kg/m   BP/Weight 02/18/2021 76/04/9507 06/03/6710  Systolic BP 458 099 833  Diastolic BP 86 72 67  Wt. (Lbs) 102.2 104.6 104.2  BMI 18.39 17.95 18.75    Physical Exam Constitutional:      General: She is not in acute distress.    Appearance: Normal appearance.  HENT:     Head: Normocephalic and atraumatic.  Eyes:     General:        Right eye: No discharge.        Left eye: No discharge.     Conjunctiva/sclera: Conjunctivae normal.  Cardiovascular:     Rate and Rhythm: Normal rate and regular  rhythm.  Pulmonary:     Effort: Pulmonary effort is normal.     Comments: No wheezing or rales currently. Musculoskeletal:     Comments: Decreased range of motion of both hips bilaterally.  Pain with FADIR.  Neurological:     Mental Status: She is alert.  Psychiatric:        Mood and Affect: Mood normal.        Behavior: Behavior normal.    Lab Results  Component Value Date   WBC 5.1 11/29/2020   HGB 14.6 11/29/2020   HCT 42.1 11/29/2020   PLT 219 11/29/2020   GLUCOSE 86 11/29/2020   CHOL 174 11/29/2020   TRIG 83 11/29/2020   HDL 66 11/29/2020   LDLCALC 93 11/29/2020   ALT 19 11/29/2020   AST 32 11/29/2020   NA 136 11/29/2020   K 4.6 11/29/2020   CL 99 11/29/2020   CREATININE 0.63 11/29/2020   BUN 11 11/29/2020   CO2 23 11/29/2020   TSH  2.850 09/05/2019   HGBA1C 5.6 02/20/2018     Assessment & Plan:   Problem List Items Addressed This Visit       Musculoskeletal and Integument   Bilateral primary osteoarthritis of hip - Primary    Chronic problem with worsening/exacerbation.  We discussed treatment options: Physical therapy, oral medications, corticosteroid injections, and ultimately hip replacement.  Patient is interested in corticosteroid injections.  She is also interested in having something on hand for the pain if she needs it.  Starting on meloxicam 7.5 mg daily as needed.  Advised to use sparingly.  Will place referral to orthopedics for corticosteroid injection in the hips.      Relevant Medications   meloxicam (MOBIC) 7.5 MG tablet   Other Relevant Orders   Ambulatory referral to Orthopedic Surgery    Meds ordered this encounter  Medications   meloxicam (MOBIC) 7.5 MG tablet    Sig: Take 1 tablet (7.5 mg total) by mouth daily as needed for pain.    Dispense:  30 tablet    Refill:  1    Follow-up:  Return in about 6 months (around 08/18/2021), or if symptoms worsen or fail to improve.  [place]

## 2021-02-18 NOTE — Addendum Note (Signed)
Addended by: [name] on: 02/18/2021 04:14 PM   Modules accepted: Orders

## 2021-02-18 NOTE — Assessment & Plan Note (Signed)
Chronic problem with worsening/exacerbation.  We discussed treatment options: Physical therapy, oral medications, corticosteroid injections, and ultimately hip replacement.  Patient is interested in corticosteroid injections.  She is also interested in having something on hand for the pain if she needs it.  Starting on meloxicam 7.5 mg daily as needed.  Advised to use sparingly.  Will place referral to orthopedics for corticosteroid injection in the hips.

## 2021-02-18 NOTE — Patient Instructions (Addendum)
Use the medication daily as needed.  I will arrange referral.  Follow up every 6 months.  Take care  Dr. Adriana Simas

## 2021-03-11 DIAGNOSIS — Z23 Encounter for immunization: Secondary | ICD-10-CM | POA: Diagnosis not present

## 2021-03-23 DIAGNOSIS — M545 Low back pain, unspecified: Secondary | ICD-10-CM | POA: Diagnosis not present

## 2021-03-23 DIAGNOSIS — M25552 Pain in left hip: Secondary | ICD-10-CM | POA: Diagnosis not present

## 2021-03-23 DIAGNOSIS — M25551 Pain in right hip: Secondary | ICD-10-CM | POA: Diagnosis not present

## 2021-03-29 DIAGNOSIS — R310 Gross hematuria: Secondary | ICD-10-CM | POA: Diagnosis not present

## 2021-03-29 DIAGNOSIS — Z87440 Personal history of urinary (tract) infections: Secondary | ICD-10-CM | POA: Diagnosis not present

## 2021-03-29 DIAGNOSIS — R82998 Other abnormal findings in urine: Secondary | ICD-10-CM | POA: Diagnosis not present

## 2021-03-29 DIAGNOSIS — N390 Urinary tract infection, site not specified: Secondary | ICD-10-CM | POA: Diagnosis not present

## 2021-04-28 NOTE — Progress Notes (Signed)
Follow-up Visit   Date: 04/29/21   Mary [name] MRN: [id number] DOB: Dec 23, 1945   Interim History: Mary [name] s a 76 y.o. right-handed Caucasian female returning to the clinic for follow-up of neuropathy.  The patient was accompanied to the clinic by self.  Her neuropathy remains unchanged. She is taking gabapentin 200mg  which helps her tingling and sleep better.  She was unable to tolerate 300mg  due to cognitive side effects.   She continues to have chronic low back pain.  She is seeing orthopeadics and will be getting hip injections.   No falls or hospitalizations.   Medications:  Current Outpatient Medications on File Prior to Visit  Medication Sig Dispense Refill   albuterol (VENTOLIN HFA) 108 (90 Base) MCG/ACT inhaler inhale TWO puffs into THE lungs EVERY 6 HOURS AS NEEDED FOR wheezing OR SHORTNESS OF BREATH 18 g 1   conjugated estrogens (PREMARIN) vaginal cream Premarin 0.625 mg/gram vaginal cream three times week     fexofenadine-pseudoephedrine (ALLEGRA-D) 60-120 MG 12 hr tablet Take 1 tablet by mouth 2 (two) times daily.     gabapentin (NEURONTIN) 100 MG capsule Take 1 tablet in the morning and 2 tablets at bedtime. (Patient taking differently: Take 2 tablets at bedtime.) 270 capsule 3   meloxicam (MOBIC) 7.5 MG tablet Take 1 tablet (7.5 mg total) by mouth daily as needed for pain. 30 tablet 1   PRESCRIPTION MEDICATION nasocort     sodium chloride HYPERTONIC 3 % nebulizer solution INHALE VIA NEBULIZER TWICE DAILY 240 mL 5   No current facility-administered medications on file prior to visit.    Allergies:  Allergies  Allergen Reactions   Levaquin [Levofloxacin]     Hip pain   Rifampin    Sulfamethoxazole-Trimethoprim     REACTION: Rash   Doxycycline Rash    Splotches on skin    Vital Signs:  BP (!) 163/77    Pulse 87    Ht 5' 2.5" (1.588 m)    Wt 106 lb (48.1 kg)    SpO2 94%    BMI 19.08 kg/m   Neurological Exam: MENTAL STATUS  including orientation to time, place, person, recent and remote memory, attention span and concentration, language, and fund of knowledge is normal.  Speech is not dysarthric.  CRANIAL NERVES:  Normal conjugate, extra-ocular eye movements in all directions of gaze.  No ptosis.    MOTOR:  Motor strength is 5/5 in all extremities, including distally.  No atrophy, fasciculations or abnormal movements.  No pronator drift.  Tone is normal.    MSRs:                                           Right        Left brachioradialis 2+  2+  biceps 2+  2+  triceps 2+  2+  patellar 3+  3+  ankle jerk 1+  1+   SENSORY:  Vibration reduced at the ankles. Temperature intact throughout.   COORDINATION/GAIT:    Intact rapid alternating movements bilaterally.  Gait narrow based and stable. Stressed and tandem gait.   Data: n/a  IMPRESSION/PLAN: Idiopathic peripheral neuropathy with painful paresthesias up to mid-calf, stable.  - Continue gabapentin 200mg  at bedtime  - Continue home balance exercises  2.  Lumbar spondylosis with possible canal stenosis  - PT declined  - If symptoms get worse,  MRI lumbar spine can be ordered  Return to clinic in 1 year  Thank you for allowing me to participate in patient's care.  If I can answer any additional questions, I would be pleased to do so.    Sincerely,    Dov Dill K. Allena Katz, DO

## 2021-04-29 ENCOUNTER — Encounter: Payer: Self-pay | Admitting: Neurology

## 2021-04-29 ENCOUNTER — Ambulatory Visit (INDEPENDENT_AMBULATORY_CARE_PROVIDER_SITE_OTHER): Payer: Medicare Other | Admitting: Neurology

## 2021-04-29 ENCOUNTER — Other Ambulatory Visit: Payer: Self-pay

## 2021-04-29 VITALS — BP 163/77 | HR 87 | Ht 62.5 in | Wt 106.0 lb

## 2021-04-29 DIAGNOSIS — M4306 Spondylolysis, lumbar region: Secondary | ICD-10-CM | POA: Diagnosis not present

## 2021-04-29 DIAGNOSIS — G608 Other hereditary and idiopathic neuropathies: Secondary | ICD-10-CM

## 2021-04-29 NOTE — Patient Instructions (Signed)
Return to clinic in 1 year.

## 2021-05-09 DIAGNOSIS — M25552 Pain in left hip: Secondary | ICD-10-CM | POA: Diagnosis not present

## 2021-05-09 DIAGNOSIS — M25551 Pain in right hip: Secondary | ICD-10-CM | POA: Diagnosis not present

## 2021-05-12 ENCOUNTER — Ambulatory Visit (INDEPENDENT_AMBULATORY_CARE_PROVIDER_SITE_OTHER): Payer: Medicare Other | Admitting: Family Medicine

## 2021-05-12 ENCOUNTER — Ambulatory Visit (HOSPITAL_COMMUNITY)
Admission: RE | Admit: 2021-05-12 | Discharge: 2021-05-12 | Disposition: A | Discharge status: Home / Self Care | Payer: Medicare Other | Source: Ambulatory Visit | Attending: Family Medicine | Admitting: Family Medicine

## 2021-05-12 ENCOUNTER — Other Ambulatory Visit: Payer: Self-pay

## 2021-05-12 VITALS — BP 134/71 | HR 88 | Temp 97.6°F | Ht 62.5 in | Wt 108.0 lb

## 2021-05-12 DIAGNOSIS — S52501A Unspecified fracture of the lower end of right radius, initial encounter for closed fracture: Secondary | ICD-10-CM | POA: Diagnosis not present

## 2021-05-12 DIAGNOSIS — S60211A Contusion of right wrist, initial encounter: Secondary | ICD-10-CM | POA: Diagnosis not present

## 2021-05-12 DIAGNOSIS — S6991XA Unspecified injury of right wrist, hand and finger(s), initial encounter: Secondary | ICD-10-CM | POA: Diagnosis not present

## 2021-05-12 DIAGNOSIS — M25531 Pain in right wrist: Secondary | ICD-10-CM | POA: Diagnosis not present

## 2021-05-12 NOTE — Assessment & Plan Note (Signed)
Stat x-ray was obtained and was intimately reviewed by me.  Interpretation: Distal radius fracture with mild displacement and impaction.  We do not have splinting supplies at our clinic.  We have contacted [facility].  Patient is going to be [facility] for further management of her fracture.

## 2021-05-12 NOTE — Patient Instructions (Signed)
We will call with the results.  Be sure to ice and elevate.  Take care  Dr. Adriana Simas

## 2021-05-12 NOTE — Progress Notes (Signed)
Subjective:  Patient ID: Mary [name], female    DOB: 1945-07-25  Age: 76 y.o. MRN: [id number]  CC: Chief Complaint  Patient presents with   partial fall and R arm injury    Swelling / pain Happened yesterday     HPI:  76 year old female presents for evaluation the above.  Patient suffered a fall yesterday.  Fell on an outstretched right hand.  She is experiencing right wrist discomfort, bruising, and swelling.  Pain is mild.  She is concerned about the possibility of fracture.  She reports good range of motion.  No other associated symptoms.  No other complaints.  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[place]) 12/06/2006   Bronchiectasis ([place]) 12/06/2006   Osteoporosis 12/06/2006    Social Hx   Social History   Socioeconomic History   Marital status: Married    Spouse name: Not on file   Number of children: 1   Years of education: Not on file   Highest education level: Not on file  Occupational History   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  Tobacco Use   Smoking status: Never   Smokeless tobacco: Never  Vaping Use   Vaping Use: Never used  Substance and Sexual Activity   Alcohol use: Yes    Comment: Occasional wine with meal   Drug use: No   Sexual activity: Not Currently  Other Topics Concern   Not on file  Social History Narrative   Right Handed   Lives in one story home    Drinks caffeine    Social Determinants of Health   Financial Resource Strain: Not on file  Food Insecurity: Not on file  Transportation Needs: Not on file  Physical Activity: Not on file  Stress: Not on file  Social Connections: Not on  file    Review of Systems Per HPI  Objective:  BP 134/71    Pulse 88    Temp 97.6 F (36.4 C)    Ht 5' 2.5" (1.588 m)    Wt 108 lb (49 kg)    SpO2 95%    BMI 19.44 kg/m   BP/Weight 05/12/2021 04/29/2021 22/05/5425  Systolic BP 062 376 283  Diastolic BP 71 77 86  Wt. (Lbs) 108 106 102.2  BMI 19.44 19.08 18.39    Physical Exam Vitals and nursing note reviewed.  Constitutional:      General: She is not in acute distress.    Appearance: Normal appearance.  HENT:     Head: Normocephalic and atraumatic.  Cardiovascular:     Rate and Rhythm: Normal rate and regular rhythm.  Pulmonary:     Effort: Pulmonary effort is normal.     Breath sounds: Normal breath sounds.  Neurological:     Mental Status: She is alert.  Psychiatric:        Mood and Affect: Mood normal.        Behavior: Behavior normal.    Lab Results  Component Value Date   WBC 5.1 11/29/2020   HGB 14.6 11/29/2020   HCT 42.1 11/29/2020   PLT 219 11/29/2020   GLUCOSE 86 11/29/2020   CHOL 174 11/29/2020   TRIG 83 11/29/2020  HDL 66 11/29/2020   LDLCALC 93 11/29/2020   ALT 19 11/29/2020   AST 32 11/29/2020   NA 136 11/29/2020   K 4.6 11/29/2020   CL 99 11/29/2020   CREATININE 0.63 11/29/2020   BUN 11 11/29/2020   CO2 23 11/29/2020   TSH 2.850 09/05/2019   HGBA1C 5.6 02/20/2018     Assessment & Plan:   Problem List Items Addressed This Visit       Other   Injury of right wrist - Primary    Stat x-ray was obtained and was intimately reviewed by me.  Interpretation: Distal radius fracture with mild displacement and impaction.  We do not have splinting supplies at our clinic.  We have contacted [facility].  Patient is going to be [facility] for further management of her fracture.      Relevant Orders   DG Wrist Complete Right (Completed)   Thersa Salt DO [place]

## 2021-05-13 DIAGNOSIS — S52501A Unspecified fracture of the lower end of right radius, initial encounter for closed fracture: Secondary | ICD-10-CM | POA: Diagnosis not present

## 2021-05-13 DIAGNOSIS — M25531 Pain in right wrist: Secondary | ICD-10-CM | POA: Diagnosis not present

## 2021-05-27 DIAGNOSIS — S52501A Unspecified fracture of the lower end of right radius, initial encounter for closed fracture: Secondary | ICD-10-CM | POA: Diagnosis not present

## 2021-05-28 ENCOUNTER — Other Ambulatory Visit: Payer: Self-pay | Admitting: Emergency Medicine

## 2021-05-30 NOTE — Telephone Encounter (Signed)
Pt needs appt for refills

## 2021-06-22 DIAGNOSIS — M25531 Pain in right wrist: Secondary | ICD-10-CM | POA: Diagnosis not present

## 2021-06-22 DIAGNOSIS — S52501A Unspecified fracture of the lower end of right radius, initial encounter for closed fracture: Secondary | ICD-10-CM | POA: Diagnosis not present

## 2021-07-03 ENCOUNTER — Other Ambulatory Visit: Payer: Self-pay | Admitting: Emergency Medicine

## 2021-07-12 ENCOUNTER — Other Ambulatory Visit: Payer: Self-pay | Admitting: Emergency Medicine

## 2021-07-13 ENCOUNTER — Other Ambulatory Visit: Payer: Self-pay | Admitting: Emergency Medicine

## 2021-08-15 DIAGNOSIS — M25552 Pain in left hip: Secondary | ICD-10-CM | POA: Diagnosis not present

## 2021-08-17 ENCOUNTER — Ambulatory Visit (INDEPENDENT_AMBULATORY_CARE_PROVIDER_SITE_OTHER): Payer: Medicare Other | Admitting: Family Medicine

## 2021-08-17 ENCOUNTER — Encounter: Payer: Self-pay | Admitting: Family Medicine

## 2021-08-17 VITALS — BP 132/82 | HR 67 | Temp 97.6°F | Wt 103.8 lb

## 2021-08-17 DIAGNOSIS — J479 Bronchiectasis, uncomplicated: Secondary | ICD-10-CM | POA: Diagnosis not present

## 2021-08-17 DIAGNOSIS — M810 Age-related osteoporosis without current pathological fracture: Secondary | ICD-10-CM | POA: Diagnosis not present

## 2021-08-17 DIAGNOSIS — G608 Other hereditary and idiopathic neuropathies: Secondary | ICD-10-CM | POA: Diagnosis not present

## 2021-08-17 DIAGNOSIS — M160 Bilateral primary osteoarthritis of hip: Secondary | ICD-10-CM

## 2021-08-17 NOTE — Patient Instructions (Signed)
Follow up annually.  Call with concerns.  Take care  Dr. Nik Gorrell  

## 2021-08-18 ENCOUNTER — Encounter: Payer: Self-pay | Admitting: Family Medicine

## 2021-08-18 NOTE — Assessment & Plan Note (Signed)
Stable on gabapentin.   

## 2021-08-18 NOTE — Progress Notes (Signed)
Subjective:  Patient ID: Mary [name], female    DOB: 01-10-1946  Age: 76 y.o. MRN: [id number]  CC: Chief Complaint  Patient presents with   Follow-up    Pt has a few questions regarding hips and legs    HPI:  76 year old female with the below mentioned medical history presents for follow-up.  No recent infections or pulmonary issues.  Has history of bronchiectasis and MAC infection.  Patient is currently struggling with osteoarthritis of the hips.  She is seeing Raliegh Ip and has been getting corticosteroid injections with some improvement.  She is concerned about need for surgery in the future.  She uses naproxen a couple times a week for her pain.  She states that this works fairly well.  She tries to avoid regular use.  Neuropathy is stable on gabapentin.  Managed by neurology.  Patient has a history of osteoporosis.  Has not had a DEXA scan since 2021.  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[place]) 12/06/2006   Bronchiectasis ([place]) 12/06/2006   Osteoporosis 12/06/2006    Social Hx   Social History   Socioeconomic History   Marital status: Married    Spouse name: Not on file   Number of children: 1   Years of education: Not on file   Highest education level: Not on file  Occupational History   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  Tobacco Use   Smoking status: Never   Smokeless tobacco: Never  Vaping Use   Vaping Use: Never used  Substance and Sexual Activity   Alcohol use: Yes    Comment: Occasional wine with meal   Drug use: No   Sexual activity: Not Currently  Other Topics Concern   Not on file  Social History Narrative   Right Handed    Lives in one story home    Drinks caffeine    Social Determinants of Health   Financial Resource Strain: Not on file  Food Insecurity: Not on file  Transportation Needs: Not on file  Physical Activity: Not on file  Stress: Not on file  Social Connections: Not on file    Review of Systems  Constitutional: Negative.   Musculoskeletal:        Hip pain.   Objective:  BP 132/82   Pulse 67   Temp 97.6 F (36.4 C)   Wt 103 lb 12.8 oz (47.1 kg)   SpO2 96%   BMI 18.68 kg/m      08/17/2021    1:06 PM 05/12/2021    1:25 PM 04/29/2021    1:39 PM  BP/Weight  Systolic BP 779 390 300  Diastolic BP 82 71 77  Wt. (Lbs) 103.8 108 106  BMI 18.68 kg/m2 19.44 kg/m2 19.08 kg/m2    Physical Exam Vitals and nursing note reviewed.  Constitutional:      General: She is not in acute distress. HENT:     Head: Normocephalic and atraumatic.  Cardiovascular:     Rate and Rhythm: Normal rate.  Pulmonary:     Effort: Pulmonary effort is normal.     Breath sounds: Normal breath sounds. No wheezing, rhonchi or rales.  Neurological:     Mental Status: She is alert.  Psychiatric:        Mood and Affect: Mood normal.        Behavior: Behavior normal.    Lab Results  Component Value Date   WBC 5.1 11/29/2020   HGB 14.6 11/29/2020   HCT 42.1 11/29/2020   PLT 219 11/29/2020   GLUCOSE 86 11/29/2020   CHOL 174 11/29/2020   TRIG 83 11/29/2020   HDL 66 11/29/2020   LDLCALC 93 11/29/2020   ALT 19 11/29/2020   AST 32 11/29/2020   NA 136 11/29/2020   K 4.6 11/29/2020   CL 99 11/29/2020   CREATININE 0.63 11/29/2020   BUN 11 11/29/2020   CO2 23 11/29/2020   TSH 2.850 09/05/2019   HGBA1C 5.6 02/20/2018     Assessment & Plan:   Problem List Items Addressed This Visit       Respiratory   Bronchiectasis ([place])    No current issues.  We will continue to monitor closely.         Nervous and Auditory   Peripheral sensory neuropathy    Stable on gabapentin.          Musculoskeletal and Integument   Bilateral primary osteoarthritis of hip - Primary    Has had some improvement with the corticosteroid injections.  We discussed the natural progression and the fact that she will likely need hip replacement.  Advised sparing use of NSAIDs.       Osteoporosis    Needs DEXA scan.       [place]

## 2021-08-18 NOTE — Assessment & Plan Note (Signed)
Has had some improvement with the corticosteroid injections.  We discussed the natural progression and the fact that she will likely need hip replacement.  Advised sparing use of NSAIDs.

## 2021-08-18 NOTE — Assessment & Plan Note (Signed)
Needs DEXA scan

## 2021-08-18 NOTE — Assessment & Plan Note (Signed)
No current issues.  We will continue to monitor closely.

## 2021-08-19 ENCOUNTER — Ambulatory Visit: Payer: Medicare Other | Admitting: Emergency Medicine

## 2021-08-24 DIAGNOSIS — Z1231 Encounter for screening mammogram for malignant neoplasm of breast: Secondary | ICD-10-CM | POA: Diagnosis not present

## 2021-08-31 DIAGNOSIS — M25552 Pain in left hip: Secondary | ICD-10-CM | POA: Diagnosis not present

## 2021-09-01 ENCOUNTER — Telehealth: Payer: Self-pay | Admitting: Family Medicine

## 2021-09-01 NOTE — Telephone Encounter (Signed)
Surgical clearance form came over on patient for completion will she need another appointment. She was seen on 08/17/21. Form is in your box.

## 2021-09-15 ENCOUNTER — Telehealth: Payer: Self-pay | Admitting: Emergency Medicine

## 2021-09-15 NOTE — Telephone Encounter (Signed)
When is the surgery supposed to be? I can do her risk assessment on 6/28 unless the procedure is sooner than that.

## 2021-09-15 NOTE — Telephone Encounter (Signed)
Fax received from Dr. Margarita Rana with [facility] to perform a Left total hip arthroplasty on patient.  Patient needs surgery clearance. Patient is scheduled for OV on 09/28/2021 with Dr. Delton Coombes. Office protocol is a risk assessment can be sent to surgeon if patient has been seen in 60 days or less.   Sending to Dr. Delton Coombes for risk assessment or recommendations if patient needs to be seen in office prior to surgical procedure.

## 2021-09-19 NOTE — Telephone Encounter (Signed)
Clearance form says surgery ins pending so clearance OV on 6/28 is good.

## 2021-09-28 ENCOUNTER — Encounter: Payer: Self-pay | Admitting: Emergency Medicine

## 2021-09-28 ENCOUNTER — Ambulatory Visit (INDEPENDENT_AMBULATORY_CARE_PROVIDER_SITE_OTHER): Payer: Medicare Other | Admitting: Emergency Medicine

## 2021-09-28 DIAGNOSIS — J479 Bronchiectasis, uncomplicated: Secondary | ICD-10-CM

## 2021-09-28 NOTE — Progress Notes (Signed)
Patient ID: Mary [name], female    DOB: [birth year], 76 y.o.   MRN: [id number] HPI  ROV 02/09/21 --76 year old woman with a history of bronchiectasis and associated chronic cough.  She also has a Zenker's diverticulum with GERD and allergic rhinitis.  She is colonized with Mycobacterium AVM, Proteus, MRSA.  She is on rotating antibiotics with clarithromycin and Augmentin.  She uses 3% saline nebs, Allegra, chest vest prn.  She has daily cough, much less mucous production. She is able to get it out. Some purulence - started her clarithro a few days early.  She is on allegra-d, nasacort  High-resolution CT scan of the chest 12/10/2020 reviewed by me shows no significant change in profound pulmonary parenchymal bronchiectasis with mucoid impaction and peribronchovascular nodularity.  Stable compared with 11/09/2017   ROV 09/28/21 --76 year old woman whom I have followed for chronic cough and bronchiectasis.  She has upper and lower airway component to her cough.  She is colonized with Mycobacterium Avium, Proteus, MRSA, uses 3% saline nebs, Allegra, chest vest as needed.  She is on rotating clarithromycin and Augmentin.  Also uses Allegra-D, Nasacort. She reports that she continues to have ups / downs with her cough - has been more intrusive this Spring. She is having some increased cough - the mucous color changes. On a few occasions she has started either her clarithro or augmentin a bit early. Next CT September 2023. Uses chest vest occasionally. Uses albuterol occasionally, not every day.  She is planning for a hip replacement to be done    Vitals:   09/28/21 1423  BP: 112/70  Pulse: 76  SpO2: 95%  Weight: 102 lb 9.6 oz (46.5 kg)  Height: 5' 4.5" (1.638 m)   Gen: Pleasant, Thin woman, in no distress,  normal affect  ENT: No lesions,  mouth clear,  oropharynx clear, no postnasal drip  Neck: No JVD, no stridor  Lungs: No use of accessory muscles, no wheezes or crackles  Cardiovascular:  RRR, heart sounds normal, no murmur or gallops, no peripheral edema  Musculoskeletal: No deformities, no cyanosis or clubbing  Neuro: alert, non focal  Skin: Warm, no lesions or rash    Bronchiectasis ([facility]) Overall fairly stable although symptoms have increased some during the allergy season.  I do not see any barriers to proceeding with her hip replacement.  She is unsure whether this is going to be done under general anesthesia.  If so she is low to moderate risk.  No barriers noted.  I will have her call me if her cough frequency, purulence increases prior to that procedure.  If so then we need to repeat her sputum sample, likely tailor antibiotics appropriately.  We will continue your rotating antibiotics at the beginning of each month, clarithromycin and Augmentin Keep your albuterol available to use 2 puffs if needed for shortness of breath, chest tightness, wheezing. Use your 3% saline nebulizer to loosen mucus Continue chest vest as needed Continue Allegra-D and Nasacort as you have been taking them. You are at low to moderate risk for general anesthesia if this is required for your upcoming hip replacement.  I do not see any barriers to proceeding.  We will document this in your office notes in [place] chart. Follow with Dr Delton Coombes in 6 months or sooner if you have any problems      Levy Pupa, MD, PhD 09/28/2021, 2:49 PM [facility] [phone] or if no answer [phone]

## 2021-09-28 NOTE — Assessment & Plan Note (Signed)
Overall fairly stable although symptoms have increased some during the allergy season.  I do not see any barriers to proceeding with her hip replacement.  She is unsure whether this is going to be done under general anesthesia.  If so she is low to moderate risk.  No barriers noted.  I will have her call me if her cough frequency, purulence increases prior to that procedure.  If so then we need to repeat her sputum sample, likely tailor antibiotics appropriately.  We will continue your rotating antibiotics at the beginning of each month, clarithromycin and Augmentin Keep your albuterol available to use 2 puffs if needed for shortness of breath, chest tightness, wheezing. Use your 3% saline nebulizer to loosen mucus Continue chest vest as needed Continue Allegra-D and Nasacort as you have been taking them. You are at low to moderate risk for general anesthesia if this is required for your upcoming hip replacement.  I do not see any barriers to proceeding.  We will document this in your office notes in [place] chart. Follow with Dr Delton Coombes in 6 months or sooner if you have any problems

## 2021-09-28 NOTE — Patient Instructions (Signed)
We will continue your rotating antibiotics at the beginning of each month, clarithromycin and Augmentin Keep your albuterol available to use 2 puffs if needed for shortness of breath, chest tightness, wheezing. Use your 3% saline nebulizer to loosen mucus Continue chest vest as needed Continue Allegra-D and Nasacort as you have been taking them. You are at low to moderate risk for general anesthesia if this is required for your upcoming hip replacement.  I do not see any barriers to proceeding.  We will document this in your office notes in [place] chart. Follow with Dr Delton Coombes in 6 months or sooner if you have any problems

## 2021-09-30 DIAGNOSIS — H353132 Nonexudative age-related macular degeneration, bilateral, intermediate dry stage: Secondary | ICD-10-CM | POA: Diagnosis not present

## 2021-10-03 ENCOUNTER — Telehealth: Payer: Self-pay | Admitting: Emergency Medicine

## 2021-10-03 NOTE — Telephone Encounter (Signed)
Called patient but she did not answer. Left message for her to call back.  

## 2021-10-05 NOTE — Telephone Encounter (Signed)
OV notes and clearance form have been faxed back to [facility]. Nothing further needed at this time.  °

## 2021-10-17 NOTE — Telephone Encounter (Signed)
Pt had OV with RB 6/28 and at that visit a risk assessment was done for pt's upcoming surgery.  April, please advise if you know if [facility] had faxed the surgical clearance info to pt's orthopedist surgeon.

## 2021-10-17 NOTE — Telephone Encounter (Signed)
Called and advised patient that the office notes and surgical clearance forms were faxed over on July 5,2023. Patient verbalized understanding. Nothing further needed

## 2021-11-02 DIAGNOSIS — M25552 Pain in left hip: Secondary | ICD-10-CM | POA: Diagnosis not present

## 2021-11-02 NOTE — H&P (Signed)
HIP ARTHROPLASTY ADMISSION H&P  Patient ID: Mary [name] MRN: [id number] DOB/AGE: July 30, 1945 76 y.o.  Chief Complaint: left hip pain.  Planned Procedure Date: 11/22/21 Medical and Cardiac Clearance by Dr. Thersa Salt   Pulmonology clearance by Dr. Lamonte Sakai   HPI: Mary [name] is a 76 y.o. female who presents for evaluation of OA LEFT HIP. The patient has a history of pain and functional disability in the left hip due to arthritis and has failed non-surgical conservative treatments for greater than 12 weeks to include NSAID's and/or analgesics, corticosteriod injections, supervised PT with diminished ADL's post treatment, and activity modification.  Onset of symptoms was gradual, starting 2 years ago with gradually worsening course since that time. The patient noted no past surgery on the left hip.  Patient currently rates pain at 10 out of 10 with activity. Patient has night pain, worsening of pain with activity and weight bearing, and pain that interferes with activities of daily living.  Patient has evidence of subchondral cysts, subchondral sclerosis, periarticular osteophytes, joint space narrowing, and osteolysis of the femoral head  by imaging studies.  There is no active infection.  Past Medical History:  Diagnosis Date   Allergic rhinitis    using Nasocort daily and Allegra daily   Arthritis    "hands" (08/01/2013)   Bronchiectasis    uses Albuterol daily as needed but has been a while since used(more than a month ago),   Chronic cough    COPD (chronic obstructive pulmonary disease) (HCC)    Diverticulosis    GERD (gastroesophageal reflux disease)    takes Prilosec daily   History of bladder infections    takes Keflex daily;Dr.Ottin is Urologist   History of MRSA infection    several yrs ago   Joint pain    Mycobacterium avium complex ([place])    Osteoporosis    Osteoporosis    takes Fosamax weekly   Pneumonia 1990's   PONV (postoperative nausea and vomiting)     Zenker's diverticulum    Past Surgical History:  Procedure Laterality Date   ANTERIOR CERVICAL DECOMP/DISCECTOMY FUSION  ~ 1992   BREAST BIOPSY Left    COLONOSCOPY     COLONOSCOPY N/A 09/16/2015   Procedure: COLONOSCOPY;  Surgeon: Daneil Dolin, MD;  [facility];  Service: Endoscopy;  Laterality: N/A;  11:00 Am   ESOPHAGOGASTRODUODENOSCOPY     VIDEO BRONCHOSCOPY Bilateral 10/13/2016   Procedure: VIDEO BRONCHOSCOPY WITHOUT FLUORO;  Surgeon: Collene Gobble, MD;  Location: [facility];  Service: Cardiopulmonary;  Laterality: Bilateral;   ZENKER'S DIVERTICULECTOMY ENDOSCOPIC  08/01/2013   Allergies  Allergen Reactions   Levaquin [Levofloxacin] Other (See Comments)    Hip pain   Rifadin [Rifampin] Other (See Comments)    Flu-like symptoms   Bactrim [Sulfamethoxazole-Trimethoprim] Rash   Vibramycin [Doxycycline] Rash    Splotches on skin   Prior to Admission medications   Medication Sig Start Date End Date Taking? Authorizing Provider  albuterol (VENTOLIN HFA) 108 (90 Base) MCG/ACT inhaler Inhale 2 puffs into the lungs every 6 (six) hours as needed for shortness of breath or wheezing.   Yes [provider]  CALCIUM-VITAMIN D-VITAMIN K PO Take 1 tablet by mouth in the morning.   Yes [provider]  D-Mannose POWD Take 2 g by mouth at bedtime.   Yes [provider]  fexofenadine (ALLEGRA) 180 MG tablet Take 180 mg by mouth in the morning.   Yes [provider]  fluticasone (FLONASE) 50 MCG/ACT  nasal spray Place 1 spray into both nostrils in the morning.   Yes [provider]  gabapentin (NEURONTIN) 100 MG capsule Take 1 tablet in the morning and 2 tablets at bedtime. Patient taking differently: Take 200 mg by mouth at bedtime. 10/25/20  Yes Patel, Arvin Collard K, DO  Multiple Vitamin (MULTIVITAMIN WITH MINERALS) TABS tablet Take 1 tablet by mouth in the morning.   Yes [provider]  Polyethyl Glycol-Propyl Glycol (LUBRICANT EYE  DROPS) 0.4-0.3 % SOLN Place 1 drop into both eyes 3 (three) times daily as needed (dry/irritated eyes.).   Yes [provider]  sodium chloride (OCEAN) 0.65 % SOLN nasal spray Place 1 spray into both nostrils at bedtime.   Yes [provider]  sodium chloride, hypertonic, 3 % solution INHALE 3ML VIA NEBULIZER TWICE DAILY Patient taking differently: 3 mLs daily. 07/04/21  Yes Byrum, Rose Fillers, MD  Wheat Dextrin (BENEFIBER) POWD Take 1 Dose by mouth in the morning.   Yes [provider]  amoxicillin-clavulanate (AUGMENTIN) 875-125 MG tablet Take 1 tablet by mouth as directed. Take 1 tablet by mouth twice daily for 7 days (one week out of each month every other month--rotating with biaxin antibiotic every other month)    [provider]  clarithromycin (BIAXIN) 500 MG tablet Take 250 mg by mouth See admin instructions. Take 0.5 tablet (250 mg) by mouth twice daily for 7 days (one week out of each month every other month--rotating with augmentin antibiotic every other month)    [provider]   Social History   Socioeconomic History   Marital status: Married    Spouse name: Not on file   Number of children: 1   Years of education: Not on file   Highest education level: Not on file  Occupational History   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  Tobacco Use   Smoking status: Never   Smokeless tobacco: Never  Vaping Use   Vaping Use: Never used  Substance and Sexual Activity   Alcohol use: Yes    Comment: Occasional wine with meal   Drug use: No   Sexual activity: Not Currently  Other Topics Concern   Not on file  Social History Narrative   Right Handed   Lives in one story home    Drinks caffeine    Social Determinants of Health   Financial Resource Strain: Not on file  Food Insecurity: Not on file  Transportation Needs: Not on file  Physical Activity: Not on file  Stress: Not on file  Social Connections: Not on file   Family  History  Problem Relation Age of Onset   Allergies Mother    Heart disease Mother    Heart disease Father    Stroke Father     ROS: Currently denies lightheadedness, dizziness, Fever, chills, CP, SOB.   No personal history of DVT, PE, MI, or CVA. No loose teeth or dentures All other systems have been reviewed and were otherwise currently negative with the exception of those mentioned in the HPI and as above.  Objective: Vitals: Ht: 5'3" Wt: 101 lbs Temp: 97.5 BP: 150/75 Pulse: 68 O2 90% on room air.   Physical Exam: General: Alert, NAD. Trendelenberg Gait  HEENT: EOMI, Good Neck Extension  Pulm: No increased work of breathing.  Inspiratory and expiratory wheezes in the upper lung fields. No rales or rhonchi CV: RRR, No m/g/r appreciated  GI: soft, NT, ND. BS x 4 quadrants Neuro: CN II-XII grossly intact without  focal deficit.  Sensation intact distally Skin: No lesions in the area of chief complaint MSK/Surgical Site: non- TTP. ROM decreased d/t pain. + Stinchfield. + SLR. + FABER/FADIR. Decreased strength.  NVI.    Imaging Review Plain radiographs demonstrate severe degenerative joint disease of the bilateral hip.   The bone quality appears to be poor for age and reported activity level.  Preoperative templating of the joint replacement has been completed, documented, and submitted to the Operating Room personnel in order to optimize intra-operative equipment management.  Assessment: OA LEFT HIP Active Problems:   * No active hospital problems. *   Plan: Plan for Procedure(s): TOTAL HIP ARTHROPLASTY ANTERIOR APPROACH  The patient history, physical exam, clinical judgement of the provider and imaging are consistent with end stage degenerative joint disease and total joint arthroplasty is deemed medically necessary. The treatment options including medical management, injection therapy, and arthroplasty were discussed at length. The risks and benefits of Procedure(s): TOTAL  HIP ARTHROPLASTY ANTERIOR APPROACH were presented and reviewed.  The risks of nonoperative treatment, versus surgical intervention including but not limited to continued pain, aseptic loosening, stiffness, dislocation/subluxation, infection, bleeding, nerve injury, blood clots, cardiopulmonary complications, morbidity, mortality, among others were discussed. The patient verbalizes understanding and wishes to proceed with the plan.  Patient is being admitted for surgery, pain control, PT, prophylactic antibiotics, VTE prophylaxis, progressive ambulation, ADL's and discharge planning.   Dental prophylaxis discussed and recommended for 2 years postoperatively.  The patient does meet the criteria for TXA which will be used perioperatively.   ASA 81 mg BID will be used postoperatively for DVT prophylaxis in addition to SCDs, and early ambulation. Plan for Oxycodone, Mobic, Tylenol for pain.   Robaxin for muscle spasm.  Zofran for nausea and vomiting. Colace for constipation. Has enough at home already.  Pharmacy- [facility] The patient is planning to be discharged home with OPPT and into the care of her husband Tommie who can be reached at [phone] Follow up appt 12/07/21 at 4:15pm     Alisa Graff Office [phone] 11/02/2021 6:44 PM

## 2021-11-08 NOTE — Patient Instructions (Signed)
SURGICAL WAITING ROOM VISITATION Patients having surgery or a procedure may have no more than 2 support people in the waiting area - these visitors may rotate.   Children under the age of 27 must have an adult with them who is not the patient. If the patient needs to stay at the hospital during part of their recovery, the visitor guidelines for inpatient rooms apply. Pre-op nurse will coordinate an appropriate time for 1 support person to accompany patient in pre-op.  This support person may not rotate.    Please refer to the [facility] website for the visitor guidelines for Inpatients (after your surgery is over and you are in a regular room).       Your procedure is scheduled on:  11/22/21    Report to [facility] Main Entrance    Report to admitting at  0730 AM   Call this number if you have problems the morning of surgery [phone]   Do not eat food :After Midnight.   After Midnight you may have the following liquids until _ 0700am _____ AM/ PM DAY OF SURGERY  Water Non-Citrus Juices (without pulp, NO RED) Carbonated Beverages Black Coffee (NO MILK/CREAM OR CREAMERS, sugar ok)  Clear Tea (NO MILK/CREAM OR CREAMERS, sugar ok) regular and decaf                             Plain Jell-O (NO RED)                                           Fruit ices (not with fruit pulp, NO RED)                                     Popsicles (NO RED)                                                               Sports drinks like Gatorade (NO RED)                   The day of surgery:  Drink ONE (1) Pre-Surgery Clear Ensure or G2 at  0700AM ( have completed by )  the morning of surgery. Drink in one sitting. Do not sip.  This drink was given to you during your hospital  pre-op appointment visit. Nothing else to drink after completing the  Pre-Surgery Clear Ensure or G2.         Oral Hygiene is also important to reduce your risk of infection.                                     Remember - BRUSH YOUR TEETH THE MORNING OF SURGERY WITH YOUR REGULAR TOOTHPASTE   Do NOT smoke after Midnight   Take these medicines the morning of surgery with A SIP OF WATER:  inhalers as usual and bring, nebuiolizer if needed, allegra, flonase   DO NOT TAKE ANY ORAL DIABETIC MEDICATIONS DAY OF YOUR  SURGERY  Bring CPAP mask and tubing day of surgery.                              You may not have any metal on your body including hair pins, jewelry, and body piercing             Do not wear make-up, lotions, powders, perfumes/cologne, or deodorant  Do not wear nail polish including gel and S&S, artificial/acrylic nails, or any other type of covering on natural nails including finger and toenails. If you have artificial nails, gel coating, etc. that needs to be removed by a nail salon please have this removed prior to surgery or surgery may need to be canceled/ delayed if the surgeon/ anesthesia feels like they are unable to be safely monitored.   Do not shave  48 hours prior to surgery.               Men may shave face and neck.   Do not bring valuables to the hospital. [facility] IS NOT             RESPONSIBLE   FOR VALUABLES.   Contacts, dentures or bridgework may not be worn into surgery.   Bring small overnight bag day of surgery.   DO NOT BRING YOUR HOME MEDICATIONS TO THE HOSPITAL. PHARMACY WILL DISPENSE MEDICATIONS LISTED ON YOUR MEDICATION LIST TO YOU DURING YOUR ADMISSION IN THE HOSPITAL!    Patients discharged on the day of surgery will not be allowed to drive home.  Someone NEEDS to stay with you for the first 24 hours after anesthesia.   Special Instructions: Bring a copy of your healthcare power of attorney and living will documents         the day of surgery if you haven't scanned them before.              Please read over the following fact sheets you were given: IF YOU HAVE QUESTIONS ABOUT YOUR PRE-OP INSTRUCTIONS PLEASE CALL [phone]     [facility] -  Preparing for Surgery Before surgery, you can play an important role.  Because skin is not sterile, your skin needs to be as free of germs as possible.  You can reduce the number of germs on your skin by washing with CHG (chlorahexidine gluconate) soap before surgery.  CHG is an antiseptic cleaner which kills germs and bonds with the skin to continue killing germs even after washing. Please DO NOT use if you have an allergy to CHG or antibacterial soaps.  If your skin becomes reddened/irritated stop using the CHG and inform your nurse when you arrive at Short Stay. Do not shave (including legs and underarms) for at least 48 hours prior to the first CHG shower.  You may shave your face/neck. Please follow these instructions carefully:  1.  Shower with CHG Soap the night before surgery and the  morning of Surgery.  2.  If you choose to wash your hair, wash your hair first as usual with your  normal  shampoo.  3.  After you shampoo, rinse your hair and body thoroughly to remove the  shampoo.                           4.  Use CHG as you would any other liquid soap.  You can apply chg directly  to the skin and  wash                       Gently with a scrungie or clean washcloth.  5.  Apply the CHG Soap to your body ONLY FROM THE NECK DOWN.   Do not use on face/ open                           Wound or open sores. Avoid contact with eyes, ears mouth and genitals (private parts).                       Wash face,  Genitals (private parts) with your normal soap.             6.  Wash thoroughly, paying special attention to the area where your surgery  will be performed.  7.  Thoroughly rinse your body with warm water from the neck down.  8.  DO NOT shower/wash with your normal soap after using and rinsing off  the CHG Soap.                9.  Pat yourself dry with a clean towel.            10.  Wear clean pajamas.            11.  Place clean sheets on your bed the night of your first shower and do not  sleep  with pets. Day of Surgery : Do not apply any lotions/deodorants the morning of surgery.  Please wear clean clothes to the hospital/surgery center.  FAILURE TO FOLLOW THESE INSTRUCTIONS MAY RESULT IN THE CANCELLATION OF YOUR SURGERY PATIENT SIGNATURE_________________________________  NURSE SIGNATURE__________________________________  ________________________________________________________________________

## 2021-11-08 NOTE — Progress Notes (Signed)
Anesthesia Review:  PCP: Cardiologist : Chest x-ray : EKG : Echo : Stress test: Cardiac Cath :  Activity level:  Sleep Study/ CPAP : Fasting Blood Sugar :      / Checks Blood Sugar -- times a day:   Blood Thinner/ Instructions /Last Dose: ASA / Instructions/ Last Dose :  

## 2021-11-09 ENCOUNTER — Encounter (HOSPITAL_COMMUNITY): Payer: Self-pay

## 2021-11-09 ENCOUNTER — Other Ambulatory Visit: Payer: Self-pay

## 2021-11-09 ENCOUNTER — Encounter (HOSPITAL_COMMUNITY)
Admission: RE | Admit: 2021-11-09 | Discharge: 2021-11-09 | Disposition: A | Discharge status: Home / Self Care | Payer: Medicare Other | Source: Ambulatory Visit | Attending: Orthopedic Surgery | Admitting: Orthopedic Surgery

## 2021-11-09 VITALS — BP 140/65 | HR 61 | Temp 98.0°F | Resp 16 | Ht 64.0 in | Wt 103.0 lb

## 2021-11-09 DIAGNOSIS — Z01812 Encounter for preprocedural laboratory examination: Secondary | ICD-10-CM | POA: Insufficient documentation

## 2021-11-09 DIAGNOSIS — J449 Chronic obstructive pulmonary disease, unspecified: Secondary | ICD-10-CM | POA: Insufficient documentation

## 2021-11-09 DIAGNOSIS — Z01818 Encounter for other preprocedural examination: Secondary | ICD-10-CM

## 2021-11-09 DIAGNOSIS — M1612 Unilateral primary osteoarthritis, left hip: Secondary | ICD-10-CM | POA: Diagnosis not present

## 2021-11-09 HISTORY — DX: Anemia, unspecified: D64.9

## 2021-11-09 HISTORY — DX: Dyspnea, unspecified: R06.00

## 2021-11-09 LAB — CBC
HCT: 41.5 % (ref 36.0–46.0)
Hemoglobin: 13.6 g/dL (ref 12.0–15.0)
MCH: 30.3 pg (ref 26.0–34.0)
MCHC: 32.8 g/dL (ref 30.0–36.0)
MCV: 92.4 fL (ref 80.0–100.0)
Platelets: 213 10*3/uL (ref 150–400)
RBC: 4.49 MIL/uL (ref 3.87–5.11)
RDW: 13.1 % (ref 11.5–15.5)
WBC: 6.9 10*3/uL (ref 4.0–10.5)
nRBC: 0 % (ref 0.0–0.2)

## 2021-11-09 LAB — SURGICAL PCR SCREEN
MRSA, PCR: NEGATIVE
Staphylococcus aureus: NEGATIVE

## 2021-11-09 LAB — TYPE AND SCREEN
ABO/RH(D): B POS
Antibody Screen: NEGATIVE

## 2021-11-10 NOTE — Anesthesia Preprocedure Evaluation (Addendum)
Anesthesia Evaluation  Patient identified by MRN, date of birth, ID band Patient awake    Reviewed: Allergy & Precautions, NPO status , Patient's Chart, lab work & pertinent test results  Airway Mallampati: II  TM Distance: >3 FB Neck ROM: Full    Dental no notable dental hx.    Pulmonary COPD,    Pulmonary exam normal breath sounds clear to auscultation       Cardiovascular negative cardio ROS Normal cardiovascular exam Rhythm:Regular Rate:Normal     Neuro/Psych negative neurological ROS  negative psych ROS   GI/Hepatic Neg liver ROS, GERD  ,  Endo/Other  negative endocrine ROS  Renal/GU negative Renal ROS  negative genitourinary   Musculoskeletal  (+) Arthritis , Osteoarthritis,    Abdominal   Peds negative pediatric ROS (+)  Hematology negative hematology ROS (+)   Anesthesia Other Findings   Reproductive/Obstetrics negative OB ROS                             Anesthesia Physical Anesthesia Plan  ASA: 2  Anesthesia Plan: Spinal   Post-op Pain Management: Tylenol PO (pre-op)* and Regional block*   Induction: Intravenous  PONV Risk Score and Plan: 2 and Ondansetron, Midazolam and Treatment may vary due to age or medical condition  Airway Management Planned: Simple Face Mask  Additional Equipment:   Intra-op Plan:   Post-operative Plan:   Informed Consent: I have reviewed the patients History and Physical, chart, labs and discussed the procedure including the risks, benefits and alternatives for the proposed anesthesia with the patient or authorized representative who has indicated his/her understanding and acceptance.     Dental advisory given  Plan Discussed with: CRNA  Anesthesia Plan Comments: (See APP note by Joslyn Hy, FNP )       Anesthesia Quick Evaluation

## 2021-11-10 NOTE — Progress Notes (Signed)
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  I do not see any barriers to proceeding with her hip replacement.  She is unsure whether this is going to be done under general anesthesia.  If so she is low to moderate risk.  No barriers noted.  I will have her call me if her cough frequency, purulence increases prior to that procedure.  If so then we need to repeat her sputum sample, likely tailor antibiotics appropriately."  LABS: Labs reviewed: Acceptable for surgery. (all labs ordered are listed, but only abnormal results are displayed)  Labs Reviewed  SURGICAL PCR SCREEN  CBC  TYPE AND SCREEN     IMAGES: CT chest high res 12/10/20:  1. Pulmonary parenchymal pattern of bronchiectasis mucoid impaction, peribronchovascular nodularity and scattered architectural distortion/volume loss, similar to 11/09/2017 and most in keeping with chronic mycobacterium avium complex. 2. Aortic atherosclerosis (ICD10-I70.0). Coronary artery calcification.  EKG: N/A   CV: N/A  Past Medical History:  Diagnosis Date   Allergic rhinitis    using Nasocort daily and Allegra daily   Anemia    Arthritis    "hands" (08/01/2013)   Bronchiectasis    uses Albuterol daily as  needed but has been a while since used(more than a month ago),   Chronic cough    COPD (chronic obstructive pulmonary disease) ([place])    Diverticulosis    Dyspnea    with exertion   GERD (gastroesophageal reflux disease)    takes Prilosec daily   History of bladder infections    takes Keflex daily;Dr.Ottin is Urologist   History of MRSA infection    several yrs ago   Joint pain    Mycobacterium avium complex ([place])    Osteoporosis    Osteoporosis    takes Fosamax weekly   Pneumonia 12/02/1988   Zenker's diverticulum     Past Surgical History:  Procedure Laterality Date   ANTERIOR CERVICAL DECOMP/DISCECTOMY FUSION  ~ 1992   BREAST BIOPSY Left    COLONOSCOPY     COLONOSCOPY N/A 09/16/2015   Procedure: COLONOSCOPY;  Surgeon: Daneil Dolin, MD;  [facility];  Service: Endoscopy;  Laterality: N/A;  11:00 Am   ESOPHAGOGASTRODUODENOSCOPY     VIDEO BRONCHOSCOPY Bilateral 10/13/2016   Procedure: VIDEO BRONCHOSCOPY WITHOUT FLUORO;  Surgeon: Collene Gobble, MD;  [facility]: [facility];  Service: Cardiopulmonary;  Laterality: Bilateral;   ZENKER'S DIVERTICULECTOMY ENDOSCOPIC  08/01/2013    MEDICATIONS:  albuterol (VENTOLIN HFA) 108 (90 Base) MCG/ACT inhaler   amoxicillin-clavulanate (AUGMENTIN) 875-125 MG tablet   CALCIUM-VITAMIN D-VITAMIN K PO   clarithromycin (BIAXIN) 500 MG tablet  D-Mannose POWD   fexofenadine (ALLEGRA) 180 MG tablet   fluticasone (FLONASE) 50 MCG/ACT nasal spray   gabapentin (NEURONTIN) 100 MG capsule   Multiple Vitamin (MULTIVITAMIN WITH MINERALS) TABS tablet   Polyethyl Glycol-Propyl Glycol (LUBRICANT EYE DROPS) 0.4-0.3 % SOLN   sodium chloride (OCEAN) 0.65 % SOLN nasal spray   sodium chloride, hypertonic, 3 % solution   Wheat Dextrin (BENEFIBER) POWD   No current facility-administered medications for this encounter.    If no changes, I anticipate pt can proceed with surgery as scheduled.   Willeen Cass, PhD, FNP-BC [facility] Short Stay Surgical  Center/Anesthesiology Phone: [phone] 11/10/2021 1:33 PM

## 2021-11-13 ENCOUNTER — Other Ambulatory Visit: Payer: Self-pay | Admitting: Neurology

## 2021-11-21 ENCOUNTER — Other Ambulatory Visit: Payer: Self-pay | Admitting: Family Medicine

## 2021-11-22 ENCOUNTER — Ambulatory Visit (HOSPITAL_COMMUNITY): Payer: Medicare Other

## 2021-11-22 ENCOUNTER — Encounter (HOSPITAL_COMMUNITY)
Admission: RE | Disposition: A | Discharge status: Home / Self Care | Payer: Self-pay | Source: Home / Self Care | Attending: Orthopedic Surgery

## 2021-11-22 ENCOUNTER — Ambulatory Visit (HOSPITAL_COMMUNITY): Payer: Medicare Other | Admitting: Emergency Medicine

## 2021-11-22 ENCOUNTER — Other Ambulatory Visit: Payer: Self-pay

## 2021-11-22 ENCOUNTER — Encounter (HOSPITAL_COMMUNITY): Payer: Self-pay | Admitting: Orthopedic Surgery

## 2021-11-22 ENCOUNTER — Ambulatory Visit (HOSPITAL_BASED_OUTPATIENT_CLINIC_OR_DEPARTMENT_OTHER): Payer: Medicare Other | Admitting: Certified Registered"

## 2021-11-22 ENCOUNTER — Ambulatory Visit (HOSPITAL_COMMUNITY)
Admission: RE | Admit: 2021-11-22 | Discharge: 2021-11-22 | Disposition: A | Discharge status: Home / Self Care | Payer: Medicare Other | Attending: Orthopedic Surgery | Admitting: Orthopedic Surgery

## 2021-11-22 DIAGNOSIS — J449 Chronic obstructive pulmonary disease, unspecified: Secondary | ICD-10-CM | POA: Insufficient documentation

## 2021-11-22 DIAGNOSIS — K219 Gastro-esophageal reflux disease without esophagitis: Secondary | ICD-10-CM | POA: Diagnosis not present

## 2021-11-22 DIAGNOSIS — Z471 Aftercare following joint replacement surgery: Secondary | ICD-10-CM | POA: Diagnosis not present

## 2021-11-22 DIAGNOSIS — M1990 Unspecified osteoarthritis, unspecified site: Secondary | ICD-10-CM | POA: Diagnosis not present

## 2021-11-22 DIAGNOSIS — M1612 Unilateral primary osteoarthritis, left hip: Secondary | ICD-10-CM | POA: Diagnosis not present

## 2021-11-22 DIAGNOSIS — M25552 Pain in left hip: Secondary | ICD-10-CM | POA: Diagnosis present

## 2021-11-22 DIAGNOSIS — Z96642 Presence of left artificial hip joint: Secondary | ICD-10-CM | POA: Diagnosis not present

## 2021-11-22 DIAGNOSIS — Z01818 Encounter for other preprocedural examination: Secondary | ICD-10-CM

## 2021-11-22 HISTORY — PX: TOTAL HIP ARTHROPLASTY: SHX124

## 2021-11-22 LAB — ABO/RH: ABO/RH(D): B POS

## 2021-11-22 SURGERY — ARTHROPLASTY, HIP, TOTAL, ANTERIOR APPROACH
Anesthesia: Spinal | Site: Hip | Laterality: Left

## 2021-11-22 MED ORDER — OXYCODONE HCL 5 MG PO TABS: 5.0000 mg | ORAL_TABLET | Freq: Once | ORAL | Status: DC | PRN

## 2021-11-22 MED ORDER — ACETAMINOPHEN 500 MG PO TABS: 1000.0000 mg | ORAL_TABLET | Freq: Three times a day (TID) | ORAL | 0 refills | Status: DC | PRN

## 2021-11-22 MED ORDER — TRANEXAMIC ACID-NACL 1000-0.7 MG/100ML-% IV SOLN
1000.0000 mg | INTRAVENOUS | Status: AC
  Administered 2021-11-22: 1000 mg via INTRAVENOUS
  Filled 2021-11-22: qty 100

## 2021-11-22 MED ORDER — DEXAMETHASONE SODIUM PHOSPHATE 10 MG/ML IJ SOLN
INTRAMUSCULAR | Status: AC
  Filled 2021-11-22: qty 1

## 2021-11-22 MED ORDER — TRAMADOL HCL 50 MG PO TABS: 50.0000 mg | ORAL_TABLET | Freq: Four times a day (QID) | ORAL | Status: DC

## 2021-11-22 MED ORDER — PHENYLEPHRINE HCL-NACL 20-0.9 MG/250ML-% IV SOLN
INTRAVENOUS | Status: AC
  Filled 2021-11-22: qty 250

## 2021-11-22 MED ORDER — TRANEXAMIC ACID-NACL 1000-0.7 MG/100ML-% IV SOLN: 1000.0000 mg | Freq: Once | INTRAVENOUS | Status: DC

## 2021-11-22 MED ORDER — 0.9 % SODIUM CHLORIDE (POUR BTL) OPTIME
TOPICAL | Status: DC | PRN
  Administered 2021-11-22: 1000 mL

## 2021-11-22 MED ORDER — METOCLOPRAMIDE HCL 5 MG/ML IJ SOLN: 5.0000 mg | Freq: Three times a day (TID) | INTRAMUSCULAR | Status: DC | PRN

## 2021-11-22 MED ORDER — HYDROMORPHONE HCL 1 MG/ML IJ SOLN: 0.2500 mg | INTRAMUSCULAR | Status: DC | PRN

## 2021-11-22 MED ORDER — HYDROCODONE-ACETAMINOPHEN 5-325 MG PO TABS
1.0000 | ORAL_TABLET | ORAL | Status: DC | PRN
  Administered 2021-11-22: 1 via ORAL

## 2021-11-22 MED ORDER — HYDROCODONE-ACETAMINOPHEN 5-325 MG PO TABS
ORAL_TABLET | ORAL | Status: AC
  Filled 2021-11-22: qty 1

## 2021-11-22 MED ORDER — LACTATED RINGERS IV BOLUS: 250.0000 mL | Freq: Once | INTRAVENOUS | Status: DC

## 2021-11-22 MED ORDER — ORAL CARE MOUTH RINSE: 15.0000 mL | Freq: Once | OROMUCOSAL | Status: AC

## 2021-11-22 MED ORDER — LACTATED RINGERS IV BOLUS
250.0000 mL | Freq: Once | INTRAVENOUS | Status: AC
  Administered 2021-11-22: 250 mL via INTRAVENOUS

## 2021-11-22 MED ORDER — ONDANSETRON HCL 4 MG/2ML IJ SOLN
INTRAMUSCULAR | Status: AC
  Filled 2021-11-22: qty 2

## 2021-11-22 MED ORDER — WATER FOR IRRIGATION, STERILE IR SOLN
Status: DC | PRN
  Administered 2021-11-22: 2000 mL

## 2021-11-22 MED ORDER — CHLORHEXIDINE GLUCONATE 0.12 % MT SOLN
15.0000 mL | Freq: Once | OROMUCOSAL | Status: AC
  Administered 2021-11-22: 15 mL via OROMUCOSAL

## 2021-11-22 MED ORDER — MELOXICAM 15 MG PO TABS: 15.0000 mg | ORAL_TABLET | Freq: Every day | ORAL | 0 refills | Status: DC | PRN

## 2021-11-22 MED ORDER — ALBUTEROL SULFATE (2.5 MG/3ML) 0.083% IN NEBU
2.5000 mg | INHALATION_SOLUTION | Freq: Four times a day (QID) | RESPIRATORY_TRACT | Status: DC | PRN
Start: 2021-11-22 — End: 2021-11-22

## 2021-11-22 MED ORDER — ONDANSETRON HCL 4 MG/2ML IJ SOLN
INTRAMUSCULAR | Status: DC | PRN
  Administered 2021-11-22: 4 mg via INTRAVENOUS

## 2021-11-22 MED ORDER — ASPIRIN 81 MG PO TBEC: 81.0000 mg | DELAYED_RELEASE_TABLET | Freq: Two times a day (BID) | ORAL | 0 refills | Status: DC

## 2021-11-22 MED ORDER — BUPIVACAINE HCL (PF) 0.5 % IJ SOLN
INTRAMUSCULAR | Status: AC
  Filled 2021-11-22: qty 30

## 2021-11-22 MED ORDER — POVIDONE-IODINE 10 % EX SWAB: 2.0000 | Freq: Once | CUTANEOUS | Status: DC

## 2021-11-22 MED ORDER — MORPHINE SULFATE (PF) 2 MG/ML IV SOLN: 0.5000 mg | INTRAVENOUS | Status: DC | PRN

## 2021-11-22 MED ORDER — PROMETHAZINE HCL 25 MG/ML IJ SOLN: 6.2500 mg | INTRAMUSCULAR | Status: DC | PRN

## 2021-11-22 MED ORDER — POVIDONE-IODINE 10 % EX SWAB
2.0000 | Freq: Once | CUTANEOUS | Status: AC
  Administered 2021-11-22: 2 via TOPICAL

## 2021-11-22 MED ORDER — ONDANSETRON HCL 4 MG/2ML IJ SOLN: 4.0000 mg | Freq: Four times a day (QID) | INTRAMUSCULAR | Status: DC | PRN

## 2021-11-22 MED ORDER — BUPIVACAINE IN DEXTROSE 0.75-8.25 % IT SOLN
INTRATHECAL | Status: DC | PRN
  Administered 2021-11-22: 2 mL via INTRATHECAL

## 2021-11-22 MED ORDER — LACTATED RINGERS IV SOLN: INTRAVENOUS | Status: DC

## 2021-11-22 MED ORDER — CEFAZOLIN SODIUM-DEXTROSE 2-4 GM/100ML-% IV SOLN
2.0000 g | INTRAVENOUS | Status: AC
  Administered 2021-11-22: 2 g via INTRAVENOUS
  Filled 2021-11-22: qty 100

## 2021-11-22 MED ORDER — ACETAMINOPHEN 325 MG PO TABS: 325.0000 mg | ORAL_TABLET | Freq: Four times a day (QID) | ORAL | Status: DC | PRN

## 2021-11-22 MED ORDER — BUPIVACAINE LIPOSOME 1.3 % IJ SUSP: 10.0000 mL | Freq: Once | INTRAMUSCULAR | Status: DC

## 2021-11-22 MED ORDER — BUPIVACAINE-MELOXICAM ER 400-12 MG/14ML IJ SOLN
INTRAMUSCULAR | Status: AC
  Filled 2021-11-22: qty 1

## 2021-11-22 MED ORDER — METHOCARBAMOL 750 MG PO TABS: 750.0000 mg | ORAL_TABLET | Freq: Three times a day (TID) | ORAL | 0 refills | Status: DC | PRN

## 2021-11-22 MED ORDER — HYDROCODONE-ACETAMINOPHEN 7.5-325 MG PO TABS: 1.0000 | ORAL_TABLET | ORAL | Status: DC | PRN

## 2021-11-22 MED ORDER — ACETAMINOPHEN 500 MG PO TABS: 500.0000 mg | ORAL_TABLET | Freq: Four times a day (QID) | ORAL | Status: DC

## 2021-11-22 MED ORDER — METHOCARBAMOL 500 MG IVPB - SIMPLE MED: 500.0000 mg | Freq: Four times a day (QID) | INTRAVENOUS | Status: DC | PRN

## 2021-11-22 MED ORDER — CEFAZOLIN SODIUM-DEXTROSE 1-4 GM/50ML-% IV SOLN: 1.0000 g | Freq: Four times a day (QID) | INTRAVENOUS | Status: DC

## 2021-11-22 MED ORDER — LACTATED RINGERS IV BOLUS
500.0000 mL | Freq: Once | INTRAVENOUS | Status: AC
  Administered 2021-11-22: 500 mL via INTRAVENOUS

## 2021-11-22 MED ORDER — ONDANSETRON 4 MG PO TBDP: 4.0000 mg | ORAL_TABLET | Freq: Two times a day (BID) | ORAL | 0 refills | Status: DC | PRN

## 2021-11-22 MED ORDER — ACETAMINOPHEN 500 MG PO TABS
1000.0000 mg | ORAL_TABLET | Freq: Once | ORAL | Status: AC
  Administered 2021-11-22: 1000 mg via ORAL
  Filled 2021-11-22: qty 2

## 2021-11-22 MED ORDER — PROPOFOL 500 MG/50ML IV EMUL
INTRAVENOUS | Status: DC | PRN
  Administered 2021-11-22: 50 ug/kg/min via INTRAVENOUS
  Administered 2021-11-22: 150 mg via INTRAVENOUS

## 2021-11-22 MED ORDER — DEXAMETHASONE SODIUM PHOSPHATE 10 MG/ML IJ SOLN
8.0000 mg | Freq: Once | INTRAMUSCULAR | Status: AC
  Administered 2021-11-22: 8 mg via INTRAVENOUS

## 2021-11-22 MED ORDER — PROPOFOL 1000 MG/100ML IV EMUL
INTRAVENOUS | Status: AC
  Filled 2021-11-22: qty 100

## 2021-11-22 MED ORDER — METHOCARBAMOL 500 MG PO TABS: 500.0000 mg | ORAL_TABLET | Freq: Four times a day (QID) | ORAL | Status: DC | PRN

## 2021-11-22 MED ORDER — BUPIVACAINE-MELOXICAM ER 400-12 MG/14ML IJ SOLN
INTRAMUSCULAR | Status: DC | PRN
  Administered 2021-11-22: 400 mg

## 2021-11-22 MED ORDER — METOCLOPRAMIDE HCL 5 MG PO TABS: 5.0000 mg | ORAL_TABLET | Freq: Three times a day (TID) | ORAL | Status: DC | PRN

## 2021-11-22 MED ORDER — OXYCODONE HCL 5 MG/5ML PO SOLN: 5.0000 mg | Freq: Once | ORAL | Status: DC | PRN

## 2021-11-22 MED ORDER — ONDANSETRON HCL 4 MG PO TABS: 4.0000 mg | ORAL_TABLET | Freq: Four times a day (QID) | ORAL | Status: DC | PRN

## 2021-11-22 MED ORDER — HYDROCODONE-ACETAMINOPHEN 10-325 MG PO TABS: 1.0000 | ORAL_TABLET | Freq: Four times a day (QID) | ORAL | 0 refills | Status: DC | PRN

## 2021-11-22 SURGICAL SUPPLY — 48 items
APL PRP STRL LF DISP 70% ISPRP (MISCELLANEOUS) ×1
BAG COUNTER SPONGE SURGICOUNT (BAG) IMPLANT
BAG SPEC THK2 15X12 ZIP CLS (MISCELLANEOUS)
BAG SPNG CNTER NS LX DISP (BAG) ×1
BAG ZIPLOCK 12X15 (MISCELLANEOUS) IMPLANT
BLADE SAG 18X100X1.27 (BLADE) ×2 IMPLANT
BLADE SURG SZ10 CARB STEEL (BLADE) ×2 IMPLANT
CHLORAPREP W/TINT 26 (MISCELLANEOUS) ×2 IMPLANT
CLSR STERI-STRIP ANTIMIC 1/2X4 (GAUZE/BANDAGES/DRESSINGS) ×2 IMPLANT
COVER PERINEAL POST (MISCELLANEOUS) ×2 IMPLANT
COVER SURGICAL LIGHT HANDLE (MISCELLANEOUS) ×2 IMPLANT
DRAPE IMP U-DRAPE 54X76 (DRAPES) ×2 IMPLANT
DRAPE STERI IOBAN 125X83 (DRAPES) ×2 IMPLANT
DRAPE U-SHAPE 47X51 STRL (DRAPES) ×4 IMPLANT
DRSG MEPILEX BORDER 4X8 (GAUZE/BANDAGES/DRESSINGS) IMPLANT
DRSG MEPILEX POST OP 4X8 (GAUZE/BANDAGES/DRESSINGS) ×2 IMPLANT
ELECT REM PT RETURN 15FT ADLT (MISCELLANEOUS) ×2 IMPLANT
GLOVE BIO SURGEON STRL SZ7.5 (GLOVE) ×2 IMPLANT
GLOVE BIOGEL PI IND STRL 7.5 (GLOVE) ×2 IMPLANT
GLOVE BIOGEL PI IND STRL 8 (GLOVE) ×2 IMPLANT
GLOVE BIOGEL PI INDICATOR 7.5 (GLOVE) ×1
GLOVE BIOGEL PI INDICATOR 8 (GLOVE) ×1
GLOVE SURG SYN 7.5  E (GLOVE) ×1
GLOVE SURG SYN 7.5 E (GLOVE) ×1 IMPLANT
GLOVE SURG SYN 7.5 PF PI (GLOVE) ×2 IMPLANT
GOWN SPEC L4 XLG W/TWL (GOWN DISPOSABLE) ×2 IMPLANT
GOWN STRL REUS W/ TWL LRG LVL3 (GOWN DISPOSABLE) ×2 IMPLANT
GOWN STRL REUS W/TWL LRG LVL3 (GOWN DISPOSABLE) ×1
HEAD CERAMIC FEMORAL 36MM (Head) IMPLANT
HOLDER FOLEY CATH W/STRAP (MISCELLANEOUS) IMPLANT
INSERT 0 DEGREE 36 (Miscellaneous) IMPLANT
KIT TURNOVER KIT A (KITS) IMPLANT
MANIFOLD NEPTUNE II (INSTRUMENTS) ×2 IMPLANT
NS IRRIG 1000ML POUR BTL (IV SOLUTION) ×2 IMPLANT
PACK ANTERIOR HIP CUSTOM (KITS) ×2 IMPLANT
PROTECTOR NERVE ULNAR (MISCELLANEOUS) ×2 IMPLANT
SCREW HEX LP 6.5X20 (Screw) IMPLANT
SHELL TRIDENT II CLUST 50 (Shell) IMPLANT
SPIKE FLUID TRANSFER (MISCELLANEOUS) ×4 IMPLANT
STEM ACCOLADE II SZ5 (Stem) IMPLANT
SUT MNCRL AB 3-0 PS2 18 (SUTURE) ×2 IMPLANT
SUT VIC AB 0 CT1 36 (SUTURE) ×2 IMPLANT
SUT VIC AB 1 CT1 36 (SUTURE) ×2 IMPLANT
SUT VIC AB 2-0 CT1 27 (SUTURE) ×2
SUT VIC AB 2-0 CT1 TAPERPNT 27 (SUTURE) ×4 IMPLANT
TRAY FOLEY MTR SLVR 16FR STAT (SET/KITS/TRAYS/PACK) IMPLANT
TUBE SUCTION HIGH CAP CLEAR NV (SUCTIONS) ×2 IMPLANT
WATER STERILE IRR 1000ML POUR (IV SOLUTION) ×4 IMPLANT

## 2021-11-22 NOTE — Interval H&P Note (Signed)
History and Physical Interval Note:  11/22/2021 8:34 AM  Mary [name] Gathers  has presented today for surgery, with the diagnosis of OA LEFT HIP.  The various methods of treatment have been discussed with the patient and family. After consideration of risks, benefits and other options for treatment, the patient has consented to  Procedure(s): TOTAL HIP ARTHROPLASTY ANTERIOR APPROACH (Left) as a surgical intervention.  The patient's history has been reviewed, patient examined, no change in status, stable for surgery.  I have reviewed the patient's chart and labs.  Questions were answered to the patient's satisfaction.     Mary [name]

## 2021-11-22 NOTE — Op Note (Signed)
11/22/2021  11:32 AM  PATIENT:  Mary [name]   MRN: [id number]  PRE-OPERATIVE DIAGNOSIS:  OA LEFT HIP  POST-OPERATIVE DIAGNOSIS:  OA LEFT HIP  PROCEDURE:  Procedure(s): TOTAL HIP ARTHROPLASTY ANTERIOR APPROACH  PREOPERATIVE INDICATIONS:    KERI VEALE is an 76 y.o. female who has a diagnosis of <principal problem not specified> and elected for surgical management after failing conservative treatment.  The risks benefits and alternatives were discussed with the patient including but not limited to the risks of nonoperative treatment, versus surgical intervention including infection, bleeding, nerve injury, periprosthetic fracture, the need for revision surgery, dislocation, leg length discrepancy, blood clots, cardiopulmonary complications, morbidity, mortality, among others, and they were willing to proceed.     OPERATIVE REPORT     SURGEON:   Renette Butters, MD    ASSISTANT:  Aggie Moats, PA-C, he was present and scrubbed throughout the case, critical for completion in a timely fashion, and for retraction, instrumentation, and closure.     ANESTHESIA:  General    COMPLICATIONS:  None.     COMPONENTS:  [employer] acolade fit femur size 5 with a 36 mm -0 head ball and an acetabular shell size 50 with a  polyethylene liner    PROCEDURE IN DETAIL:   The patient was met in the holding area and  identified.  The appropriate hip was identified and marked at the operative site.  The patient was then transported to the OR  and  placed under anesthesia per that record.  At that point, the patient was  placed in the supine position and  secured to the operating room table and all bony prominences padded. He received pre-operative antibiotics    The operative lower extremity was prepped from the iliac crest to the distal leg.  Sterile draping was performed.  Time out was performed prior to incision.      Skin incision was made just 2 cm lateral to the ASIS  extending in line  with the tensor fascia lata. Electrocautery was used to control all bleeders. I dissected down sharply to the fascia of the tensor fascia lata was confirmed that the muscle fibers beneath were running posteriorly. I then incised the fascia over the superficial tensor fascia lata in line with the incision. The fascia was elevated off the anterior aspect of the muscle the muscle was retracted posteriorly and protected throughout the case. I then used electrocautery to incise the tensor fascia lata fascia control and all bleeders. Immediately visible was the fat over top of the anterior neck and capsule.  I removed the anterior fat from the capsule and elevated the rectus muscle off of the anterior capsule. I then removed a large time of capsule. The retractors were then placed over the anterior acetabulum as well as around the superior and inferior neck.  I then made a femoral neck cut. Then used the power corkscrew to remove the femoral head from the acetabulum and thoroughly irrigated the acetabulum. I sized the femoral head.    I then exposed the deep acetabulum, cleared out any tissue including the ligamentum teres.   After adequate visualization, I excised the labrum, and then sequentially reamed.  I then impacted the acetabular implant into place using fluoroscopy for guidance.  Appropriate version and inclination was confirmed clinically matching their bony anatomy, and with fluoroscopy.  I placed a 20 mm screw in the posterior/superio position with an excellent bite.    I then placed the polyethylene liner in  place  I then adducted the leg and released the external rotators from the posterior femur allowing it to be easily delivered up lateral and anterior to the acetabulum for preparation of the femoral canal.    I then prepared the proximal femur using the cookie-cutter and then sequentially reamed and broached.  A trial broach, neck, and head was utilized, and I reduced the hip and used  floroscopy to assess the neck length and femoral implant.  I then impacted the femoral prosthesis into place into the appropriate version. The hip was then reduced and fluoroscopy confirmed appropriate position. Leg lengths were restored.  I then irrigated the hip copiously again with, and repaired the fascia with Vicryl, followed by monocryl for the subcutaneous tissue, Monocryl for the skin, Steri-Strips and sterile gauze. The patient was then awakened and returned to PACU in stable and satisfactory condition. There were no complications.  POST OPERATIVE PLAN: WBAT, DVT px: SCD's/TED, ambulation and chemical dvt px  Edmonia Lynch, MD Orthopedic Surgeon [phone]

## 2021-11-22 NOTE — Transfer of Care (Signed)
Immediate Anesthesia Transfer of Care Note  Patient: Mary [name]  Procedure(s) Performed: TOTAL HIP ARTHROPLASTY ANTERIOR APPROACH (Left: Hip)  Patient Location: PACU  Anesthesia Type:General  Level of Consciousness: awake, alert , oriented and patient cooperative  Airway & Oxygen Therapy: Patient Spontanous Breathing and Patient connected to face mask oxygen  Post-op Assessment: Report given to RN and Post -op Vital signs reviewed and stable  Post vital signs: Reviewed and stable  Last Vitals:  Vitals Value Taken Time  BP 128/62 11/22/21 1147  Temp    Pulse 70 11/22/21 1149  Resp 19 11/22/21 1149  SpO2 100 % 11/22/21 1149  Vitals shown include unvalidated device data.  Last Pain:  Vitals:   11/22/21 0750  TempSrc: Oral  PainSc:       Patients Stated Pain Goal: 4 (11/22/21 0746)  Complications: No notable events documented.

## 2021-11-22 NOTE — Evaluation (Signed)
Physical Therapy Evaluation Patient Details Name: Mary [name] MRN: [id number] DOB: [birth year] Today's Date: 11/22/2021  History of Present Illness  Pt is a 76yo female presenting s/p L-THA, AA on 11/22/21. PMH: Anemia, brochiectasis, COPD, GERD, anterior cervical fusion 1992.  Clinical Impression  Mary [name] is a 76 y.o. female POD 0 s/p L-THA, AA. Patient reports independence with mobility at baseline. Patient is now limited by functional impairments (see PT problem list below) and requires min guard for transfers and gait with RW. Patient was able to ambulate 80 feet with RW and min guard and cues for safe walker management. Patient educated on safe sequencing for stair mobility and verbalized safe guarding position for people assisting with mobility. Patient instructed in exercises to facilitate ROM and circulation. Patient will benefit from continued skilled PT interventions to address impairments and progress towards PLOF. Patient has met mobility goals at adequate level for discharge home; will continue to follow if pt continues acute stay to progress towards Mod I goals.        Recommendations for follow up therapy are one component of a multi-disciplinary discharge planning process, led by the attending physician.  Recommendations may be updated based on patient status, additional functional criteria and insurance authorization.  Follow Up Recommendations Follow physician's recommendations for discharge plan and follow up therapies      Assistance Recommended at Discharge Intermittent Supervision/Assistance  Patient can return home with the following  A little help with walking and/or transfers;A little help with bathing/dressing/bathroom;Assistance with cooking/housework;Assist for transportation;Help with stairs or ramp for entrance    Equipment Recommendations None recommended by PT  Recommendations for Other Services       Functional Status Assessment Patient has  had a recent decline in their functional status and demonstrates the ability to make significant improvements in function in a reasonable and predictable amount of time.     Precautions / Restrictions Precautions Precautions: Fall Restrictions Weight Bearing Restrictions: No Other Position/Activity Restrictions: WBAT      Mobility  Bed Mobility Overal bed mobility: Needs Assistance Bed Mobility: Supine to Sit     Supine to sit: Min guard     General bed mobility comments: For safety only    Transfers Overall transfer level: Needs assistance Equipment used: Rolling walker (2 wheels) Transfers: Sit to/from Stand Sit to Stand: Min guard, From elevated surface           General transfer comment: For safety only from stretcher, no physical assist required    Ambulation/Gait Ambulation/Gait assistance: Supervision Gait Distance (Feet): 80 Feet Assistive device: Rolling walker (2 wheels) Gait Pattern/deviations: Step-to pattern Gait velocity: decreased     General Gait Details: Pt ambulated with RW and supervision, husband providing min guard via gait belt, step to pattern without overt LOB or physical assistance  Stairs            Wheelchair Mobility    Modified Rankin (Stroke Patients Only)       Balance Overall balance assessment: Needs assistance Sitting-balance support: Feet supported, No upper extremity supported Sitting balance-Leahy Scale: Good     Standing balance support: Reliant on assistive device for balance, During functional activity, Bilateral upper extremity supported Standing balance-Leahy Scale: Poor                               Pertinent Vitals/Pain Pain Assessment Pain Assessment: 0-10 Pain Score: 1  Pain Location: left hip Pain  Descriptors / Indicators: Operative site guarding Pain Intervention(s): Limited activity within patient's tolerance, Monitored during session, Repositioned, Ice applied    Home Living  Family/patient expects to be discharged to:: Private residence Living Arrangements: Spouse/significant other Available Help at Discharge: Family;Available 24 hours/day Type of Home: House Home Access: Stairs to enter Entrance Stairs-Rails: Right;Left;Can reach both Entrance Stairs-Number of Steps: 5   Home Layout: One level Home Equipment: Conservation officer, nature (2 wheels);Cane - single point      Prior Function Prior Level of Function : Independent/Modified Independent             Mobility Comments: IND ADLs Comments: IND     Hand Dominance        Extremity/Trunk Assessment   Upper Extremity Assessment Upper Extremity Assessment: Overall WFL for tasks assessed    Lower Extremity Assessment Lower Extremity Assessment: RLE deficits/detail;LLE deficits/detail RLE Deficits / Details: MMT ank DF/PF 5/5 RLE Sensation: WNL LLE Deficits / Details: MMT ank DF/PF 5/5 LLE Sensation: WNL    Cervical / Trunk Assessment Cervical / Trunk Assessment: Neck Surgery  Communication   Communication: No difficulties  Cognition Arousal/Alertness: Awake/alert Behavior During Therapy: WFL for tasks assessed/performed Overall Cognitive Status: Within Functional Limits for tasks assessed                                          General Comments General comments (skin integrity, edema, etc.): Husband Tommie present for session    Exercises Total Joint Exercises Ankle Circles/Pumps: AROM, 10 reps, Both Quad Sets: AROM, Left, Other reps (comment) (3) Short Arc Quad: AROM, Left, Other reps (comment) (3) Heel Slides: AROM, Left, Other reps (comment) (3) Hip ABduction/ADduction: AAROM, Left, 5 reps (with belt)   Assessment/Plan    PT Assessment Patient needs continued PT services  PT Problem List Decreased strength;Decreased range of motion;Decreased activity tolerance;Decreased balance;Decreased mobility;Decreased coordination;Pain       PT Treatment Interventions DME  instruction;Gait training;Stair training;Functional mobility training;Therapeutic activities;Therapeutic exercise;Balance training;Neuromuscular re-education;Patient/family education    PT Goals (Current goals can be found in the Care Plan section)  Acute Rehab PT Goals Patient Stated Goal: Walk without pain PT Goal Formulation: With patient Time For Goal Achievement: 11/29/21 Potential to Achieve Goals: Good    Frequency 7X/week     Co-evaluation               AM-PAC PT "6 Clicks" Mobility  Outcome Measure Help needed turning from your back to your side while in a flat bed without using bedrails?: None Help needed moving from lying on your back to sitting on the side of a flat bed without using bedrails?: A Little Help needed moving to and from a bed to a chair (including a wheelchair)?: A Little Help needed standing up from a chair using your arms (e.g., wheelchair or bedside chair)?: A Little Help needed to walk in hospital room?: A Little Help needed climbing 3-5 steps with a railing? : A Little 6 Click Score: 19    End of Session Equipment Utilized During Treatment: Gait belt Activity Tolerance: Patient tolerated treatment well;No increased pain Patient left: in chair;with call bell/phone within reach;with family/visitor present Nurse Communication: Mobility status PT Visit Diagnosis: Pain;Difficulty in walking, not elsewhere classified (R26.2) Pain - Right/Left: Left Pain - part of body: Hip    Time: 1425-1455 PT Time Calculation (min) (ACUTE ONLY): 30 min   Charges:  PT Evaluation $PT Eval Low Complexity: 1 Low PT Treatments $Gait Training: 8-22 mins        Coolidge Breeze, PT, DPT [facility] Office: [phone] Pager: [phone]  Coolidge Breeze 11/22/2021, 3:01 PM

## 2021-11-22 NOTE — Anesthesia Procedure Notes (Signed)
Spinal  Patient location during procedure: OR Start time: 11/22/2021 10:00 AM End time: 11/22/2021 10:05 AM Reason for block: surgical anesthesia Staffing Performed: anesthesiologist  Anesthesiologist: Lowella Curb, MD Performed by: Lowella Curb, MD Authorized by: Lowella Curb, MD   Preanesthetic Checklist Completed: patient identified, IV checked, site marked, risks and benefits discussed, surgical consent, monitors and equipment checked, pre-op evaluation and timeout performed Spinal Block Patient position: sitting Prep: DuraPrep Patient monitoring: heart rate, cardiac monitor, continuous pulse ox and blood pressure Approach: midline Location: L3-4 Injection technique: single-shot Needle Needle type: Quincke  Needle gauge: 22 G Needle length: 9 cm Assessment Sensory level: T4 Events: CSF return

## 2021-11-22 NOTE — Interval H&P Note (Signed)
History and Physical Interval Note:  11/22/2021 9:11 AM  Mary [name]  has presented today for surgery, with the diagnosis of OA LEFT HIP.  The various methods of treatment have been discussed with the patient and family. After consideration of risks, benefits and other options for treatment, the patient has consented to  Procedure(s): TOTAL HIP ARTHROPLASTY ANTERIOR APPROACH (Left) as a surgical intervention.  The patient's history has been reviewed, patient examined, no change in status, stable for surgery.  I have reviewed the patient's chart and labs.  Questions were answered to the patient's satisfaction.     Mary Apley

## 2021-11-22 NOTE — Anesthesia Postprocedure Evaluation (Signed)
Anesthesia Post Note  Patient: Mary [name]  Procedure(s) Performed: TOTAL HIP ARTHROPLASTY ANTERIOR APPROACH (Left: Hip)     Patient location during evaluation: PACU Anesthesia Type: Spinal Level of consciousness: awake and alert Pain management: pain level controlled Vital Signs Assessment: post-procedure vital signs reviewed and stable Respiratory status: spontaneous breathing, nonlabored ventilation and respiratory function stable Cardiovascular status: blood pressure returned to baseline and stable Postop Assessment: no apparent nausea or vomiting Anesthetic complications: no   No notable events documented.  Last Vitals:  Vitals:   11/22/21 1303 11/22/21 1315  BP: (!) 170/77 (!) 157/74  Pulse: 78 69  Resp: 20   Temp: 36.4 C   SpO2: 95% 93%    Last Pain:  Vitals:   11/22/21 1315  TempSrc:   PainSc: 0-No pain                 Lowella Curb

## 2021-11-22 NOTE — Discharge Instructions (Signed)

## 2021-11-22 NOTE — Anesthesia Procedure Notes (Signed)
Procedure Name: LMA Insertion Date/Time: 11/22/2021 10:46 AM  Performed by: Imani Fiebelkorn, Merrily Pew, CRNAPre-anesthesia Checklist: Patient identified, Emergency Drugs available, Suction available, Patient being monitored and Timeout performed Patient Re-evaluated:Patient Re-evaluated prior to induction Oxygen Delivery Method: Circle system utilized Preoxygenation: Pre-oxygenation with 100% oxygen Induction Type: IV induction Ventilation: Mask ventilation without difficulty LMA: LMA inserted and LMA flexible inserted LMA Size: 4.0 Number of attempts: 1 Dental Injury: Teeth and Oropharynx as per pre-operative assessment

## 2021-11-23 ENCOUNTER — Encounter (HOSPITAL_COMMUNITY): Payer: Self-pay | Admitting: Orthopedic Surgery

## 2021-11-24 DIAGNOSIS — M1612 Unilateral primary osteoarthritis, left hip: Secondary | ICD-10-CM | POA: Diagnosis not present

## 2021-11-26 ENCOUNTER — Other Ambulatory Visit: Payer: Self-pay

## 2021-11-26 ENCOUNTER — Emergency Department (HOSPITAL_COMMUNITY): Payer: Medicare Other

## 2021-11-26 ENCOUNTER — Encounter (HOSPITAL_COMMUNITY): Payer: Self-pay

## 2021-11-26 ENCOUNTER — Inpatient Hospital Stay (HOSPITAL_COMMUNITY)
Admission: EM | Admit: 2021-11-26 | Discharge: 2021-12-02 | DRG: 481 | Disposition: A | Discharge status: Skilled Nursing Facility | Payer: Medicare Other | Attending: Internal Medicine | Admitting: Internal Medicine

## 2021-11-26 DIAGNOSIS — Z981 Arthrodesis status: Secondary | ICD-10-CM | POA: Diagnosis not present

## 2021-11-26 DIAGNOSIS — Z87440 Personal history of urinary (tract) infections: Secondary | ICD-10-CM | POA: Diagnosis not present

## 2021-11-26 DIAGNOSIS — Z8614 Personal history of Methicillin resistant Staphylococcus aureus infection: Secondary | ICD-10-CM | POA: Diagnosis not present

## 2021-11-26 DIAGNOSIS — Z7982 Long term (current) use of aspirin: Secondary | ICD-10-CM

## 2021-11-26 DIAGNOSIS — R0689 Other abnormalities of breathing: Secondary | ICD-10-CM | POA: Diagnosis not present

## 2021-11-26 DIAGNOSIS — W19XXXD Unspecified fall, subsequent encounter: Secondary | ICD-10-CM | POA: Diagnosis not present

## 2021-11-26 DIAGNOSIS — M1611 Unilateral primary osteoarthritis, right hip: Secondary | ICD-10-CM | POA: Diagnosis not present

## 2021-11-26 DIAGNOSIS — R339 Retention of urine, unspecified: Secondary | ICD-10-CM | POA: Diagnosis not present

## 2021-11-26 DIAGNOSIS — E871 Hypo-osmolality and hyponatremia: Secondary | ICD-10-CM | POA: Diagnosis not present

## 2021-11-26 DIAGNOSIS — Y9301 Activity, walking, marching and hiking: Secondary | ICD-10-CM | POA: Diagnosis present

## 2021-11-26 DIAGNOSIS — S72355A Nondisplaced comminuted fracture of shaft of left femur, initial encounter for closed fracture: Secondary | ICD-10-CM | POA: Diagnosis not present

## 2021-11-26 DIAGNOSIS — M160 Bilateral primary osteoarthritis of hip: Secondary | ICD-10-CM | POA: Diagnosis not present

## 2021-11-26 DIAGNOSIS — Y92009 Unspecified place in unspecified non-institutional (private) residence as the place of occurrence of the external cause: Secondary | ICD-10-CM

## 2021-11-26 DIAGNOSIS — M6281 Muscle weakness (generalized): Secondary | ICD-10-CM | POA: Diagnosis not present

## 2021-11-26 DIAGNOSIS — Z8249 Family history of ischemic heart disease and other diseases of the circulatory system: Secondary | ICD-10-CM

## 2021-11-26 DIAGNOSIS — Z888 Allergy status to other drugs, medicaments and biological substances status: Secondary | ICD-10-CM | POA: Diagnosis not present

## 2021-11-26 DIAGNOSIS — S79929A Unspecified injury of unspecified thigh, initial encounter: Secondary | ICD-10-CM | POA: Diagnosis not present

## 2021-11-26 DIAGNOSIS — Z823 Family history of stroke: Secondary | ICD-10-CM | POA: Diagnosis not present

## 2021-11-26 DIAGNOSIS — D62 Acute posthemorrhagic anemia: Secondary | ICD-10-CM | POA: Diagnosis not present

## 2021-11-26 DIAGNOSIS — J479 Bronchiectasis, uncomplicated: Secondary | ICD-10-CM | POA: Diagnosis not present

## 2021-11-26 DIAGNOSIS — Z7983 Long term (current) use of bisphosphonates: Secondary | ICD-10-CM | POA: Diagnosis not present

## 2021-11-26 DIAGNOSIS — R600 Localized edema: Secondary | ICD-10-CM | POA: Diagnosis not present

## 2021-11-26 DIAGNOSIS — G608 Other hereditary and idiopathic neuropathies: Secondary | ICD-10-CM | POA: Diagnosis not present

## 2021-11-26 DIAGNOSIS — K219 Gastro-esophageal reflux disease without esophagitis: Secondary | ICD-10-CM | POA: Diagnosis present

## 2021-11-26 DIAGNOSIS — Z8709 Personal history of other diseases of the respiratory system: Secondary | ICD-10-CM | POA: Diagnosis not present

## 2021-11-26 DIAGNOSIS — S7292XS Unspecified fracture of left femur, sequela: Secondary | ICD-10-CM | POA: Diagnosis not present

## 2021-11-26 DIAGNOSIS — Z883 Allergy status to other anti-infective agents status: Secondary | ICD-10-CM | POA: Diagnosis not present

## 2021-11-26 DIAGNOSIS — M810 Age-related osteoporosis without current pathological fracture: Secondary | ICD-10-CM | POA: Diagnosis not present

## 2021-11-26 DIAGNOSIS — M25551 Pain in right hip: Secondary | ICD-10-CM | POA: Diagnosis not present

## 2021-11-26 DIAGNOSIS — W010XXA Fall on same level from slipping, tripping and stumbling without subsequent striking against object, initial encounter: Secondary | ICD-10-CM | POA: Diagnosis present

## 2021-11-26 DIAGNOSIS — Z96642 Presence of left artificial hip joint: Secondary | ICD-10-CM

## 2021-11-26 DIAGNOSIS — Y92019 Unspecified place in single-family (private) house as the place of occurrence of the external cause: Secondary | ICD-10-CM

## 2021-11-26 DIAGNOSIS — Z79899 Other long term (current) drug therapy: Secondary | ICD-10-CM

## 2021-11-26 DIAGNOSIS — Z882 Allergy status to sulfonamides status: Secondary | ICD-10-CM | POA: Diagnosis not present

## 2021-11-26 DIAGNOSIS — S72002A Fracture of unspecified part of neck of left femur, initial encounter for closed fracture: Secondary | ICD-10-CM | POA: Diagnosis present

## 2021-11-26 DIAGNOSIS — R338 Other retention of urine: Secondary | ICD-10-CM | POA: Diagnosis not present

## 2021-11-26 DIAGNOSIS — K5900 Constipation, unspecified: Secondary | ICD-10-CM | POA: Diagnosis not present

## 2021-11-26 DIAGNOSIS — S7292XD Unspecified fracture of left femur, subsequent encounter for closed fracture with routine healing: Secondary | ICD-10-CM | POA: Diagnosis not present

## 2021-11-26 DIAGNOSIS — W19XXXA Unspecified fall, initial encounter: Secondary | ICD-10-CM

## 2021-11-26 DIAGNOSIS — I959 Hypotension, unspecified: Secondary | ICD-10-CM | POA: Diagnosis not present

## 2021-11-26 DIAGNOSIS — S72115A Nondisplaced fracture of greater trochanter of left femur, initial encounter for closed fracture: Principal | ICD-10-CM | POA: Diagnosis present

## 2021-11-26 DIAGNOSIS — S72352A Displaced comminuted fracture of shaft of left femur, initial encounter for closed fracture: Secondary | ICD-10-CM | POA: Diagnosis not present

## 2021-11-26 DIAGNOSIS — S7292XA Unspecified fracture of left femur, initial encounter for closed fracture: Secondary | ICD-10-CM | POA: Diagnosis present

## 2021-11-26 DIAGNOSIS — S72112A Displaced fracture of greater trochanter of left femur, initial encounter for closed fracture: Secondary | ICD-10-CM | POA: Diagnosis not present

## 2021-11-26 DIAGNOSIS — S72402A Unspecified fracture of lower end of left femur, initial encounter for closed fracture: Secondary | ICD-10-CM | POA: Diagnosis not present

## 2021-11-26 DIAGNOSIS — E441 Mild protein-calorie malnutrition: Secondary | ICD-10-CM | POA: Diagnosis not present

## 2021-11-26 DIAGNOSIS — R9431 Abnormal electrocardiogram [ECG] [EKG]: Secondary | ICD-10-CM | POA: Diagnosis not present

## 2021-11-26 DIAGNOSIS — M8000XA Age-related osteoporosis with current pathological fracture, unspecified site, initial encounter for fracture: Secondary | ICD-10-CM | POA: Diagnosis not present

## 2021-11-26 DIAGNOSIS — S72302A Unspecified fracture of shaft of left femur, initial encounter for closed fracture: Secondary | ICD-10-CM | POA: Diagnosis not present

## 2021-11-26 DIAGNOSIS — M978XXA Periprosthetic fracture around other internal prosthetic joint, initial encounter: Secondary | ICD-10-CM | POA: Diagnosis not present

## 2021-11-26 DIAGNOSIS — M9702XA Periprosthetic fracture around internal prosthetic left hip joint, initial encounter: Secondary | ICD-10-CM | POA: Diagnosis not present

## 2021-11-26 DIAGNOSIS — R000 Tachycardia, unspecified: Secondary | ICD-10-CM | POA: Diagnosis not present

## 2021-11-26 DIAGNOSIS — R4181 Age-related cognitive decline: Secondary | ICD-10-CM | POA: Diagnosis not present

## 2021-11-26 DIAGNOSIS — D500 Iron deficiency anemia secondary to blood loss (chronic): Secondary | ICD-10-CM | POA: Diagnosis not present

## 2021-11-26 DIAGNOSIS — T84011A Broken internal left hip prosthesis, initial encounter: Secondary | ICD-10-CM | POA: Diagnosis not present

## 2021-11-26 DIAGNOSIS — R2689 Other abnormalities of gait and mobility: Secondary | ICD-10-CM | POA: Diagnosis not present

## 2021-11-26 DIAGNOSIS — Z471 Aftercare following joint replacement surgery: Secondary | ICD-10-CM | POA: Diagnosis not present

## 2021-11-26 LAB — CBC WITH DIFFERENTIAL/PLATELET
Abs Immature Granulocytes: 0.09 10*3/uL — ABNORMAL HIGH (ref 0.00–0.07)
Basophils Absolute: 0 10*3/uL (ref 0.0–0.1)
Basophils Relative: 0 %
Eosinophils Absolute: 0 10*3/uL (ref 0.0–0.5)
Eosinophils Relative: 0 %
HCT: 32.4 % — ABNORMAL LOW (ref 36.0–46.0)
Hemoglobin: 10.8 g/dL — ABNORMAL LOW (ref 12.0–15.0)
Immature Granulocytes: 1 %
Lymphocytes Relative: 13 %
Lymphs Abs: 0.9 10*3/uL (ref 0.7–4.0)
MCH: 30.9 pg (ref 26.0–34.0)
MCHC: 33.3 g/dL (ref 30.0–36.0)
MCV: 92.8 fL (ref 80.0–100.0)
Monocytes Absolute: 0.5 10*3/uL (ref 0.1–1.0)
Monocytes Relative: 7 %
Neutro Abs: 5.6 10*3/uL (ref 1.7–7.7)
Neutrophils Relative %: 79 %
Platelets: 226 10*3/uL (ref 150–400)
RBC: 3.49 MIL/uL — ABNORMAL LOW (ref 3.87–5.11)
RDW: 13.2 % (ref 11.5–15.5)
WBC: 7.1 10*3/uL (ref 4.0–10.5)
nRBC: 0 % (ref 0.0–0.2)

## 2021-11-26 LAB — BASIC METABOLIC PANEL
Anion gap: 8 (ref 5–15)
BUN: 18 mg/dL (ref 8–23)
CO2: 20 mmol/L — ABNORMAL LOW (ref 22–32)
Calcium: 7.9 mg/dL — ABNORMAL LOW (ref 8.9–10.3)
Chloride: 103 mmol/L (ref 98–111)
Creatinine, Ser: 0.46 mg/dL (ref 0.44–1.00)
GFR, Estimated: >60 mL/min (ref >60)
Glucose, Bld: 135 mg/dL — ABNORMAL HIGH (ref 70–99)
Potassium: 3.9 mmol/L (ref 3.5–5.1)
Sodium: 131 mmol/L — ABNORMAL LOW (ref 135–145)

## 2021-11-26 MED ORDER — KETOROLAC TROMETHAMINE 15 MG/ML IJ SOLN
15.0000 mg | Freq: Once | INTRAMUSCULAR | Status: AC
  Administered 2021-11-26: 15 mg via INTRAVENOUS
  Filled 2021-11-26: qty 1

## 2021-11-26 MED ORDER — SODIUM CHLORIDE 0.9 % IV SOLN: INTRAVENOUS | Status: DC

## 2021-11-26 MED ORDER — ONDANSETRON HCL 4 MG PO TABS: 4.0000 mg | ORAL_TABLET | Freq: Four times a day (QID) | ORAL | Status: DC | PRN

## 2021-11-26 MED ORDER — ONDANSETRON HCL 4 MG/2ML IJ SOLN
4.0000 mg | Freq: Once | INTRAMUSCULAR | Status: AC
  Administered 2021-11-26: 4 mg via INTRAVENOUS
  Filled 2021-11-26: qty 2

## 2021-11-26 MED ORDER — PANTOPRAZOLE SODIUM 40 MG IV SOLR
40.0000 mg | INTRAVENOUS | Status: DC
Start: 2021-11-26 — End: 2021-11-29
  Administered 2021-11-26 – 2021-11-28 (×3): 40 mg via INTRAVENOUS
  Filled 2021-11-26 (×3): qty 10

## 2021-11-26 MED ORDER — HYDROMORPHONE HCL 1 MG/ML IJ SOLN
0.5000 mg | INTRAMUSCULAR | Status: DC | PRN
  Administered 2021-11-26 – 2021-11-27 (×3): 0.5 mg via INTRAVENOUS
  Filled 2021-11-26 (×3): qty 0.5

## 2021-11-26 MED ORDER — HYDROMORPHONE HCL 1 MG/ML IJ SOLN
1.0000 mg | Freq: Once | INTRAMUSCULAR | Status: AC
  Administered 2021-11-26: 1 mg via INTRAVENOUS
  Filled 2021-11-26: qty 1

## 2021-11-26 MED ORDER — MORPHINE SULFATE (PF) 4 MG/ML IV SOLN
4.0000 mg | Freq: Once | INTRAVENOUS | Status: AC
  Administered 2021-11-26: 4 mg via INTRAVENOUS
  Filled 2021-11-26: qty 1

## 2021-11-26 MED ORDER — ONDANSETRON HCL 4 MG/2ML IJ SOLN
4.0000 mg | Freq: Four times a day (QID) | INTRAMUSCULAR | Status: DC | PRN
  Administered 2021-11-26 – 2021-12-02 (×3): 4 mg via INTRAVENOUS
  Filled 2021-11-26 (×3): qty 2

## 2021-11-26 MED ORDER — FENTANYL CITRATE (PF) 100 MCG/2ML IJ SOLN
75.0000 ug | Freq: Once | INTRAMUSCULAR | Status: AC
  Administered 2021-11-26: 75 ug via INTRAVENOUS
  Filled 2021-11-26: qty 2

## 2021-11-26 MED ORDER — ACETAMINOPHEN 325 MG PO TABS
650.0000 mg | ORAL_TABLET | Freq: Four times a day (QID) | ORAL | Status: DC | PRN
  Administered 2021-11-26: 650 mg via ORAL
  Filled 2021-11-26: qty 2

## 2021-11-26 MED ORDER — ACETAMINOPHEN 650 MG RE SUPP: 650.0000 mg | Freq: Four times a day (QID) | RECTAL | Status: DC | PRN

## 2021-11-26 NOTE — H&P (Signed)
History and Physical    Patient: Mary [name] ZHG:[id number] DOB: [birth year] DOA: 11/26/2021 DOS: the patient was seen and examined on 11/26/2021 PCP: Coral Spikes, DO  Patient coming from: Home  Chief Complaint:  Chief Complaint  Patient presents with   Fall   HPI: Mary [name] is a 76 y.o. 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  Patient had left hip replacement 4 days ago and has been doing well with physical therapy and has been able to ambulate with a walker since the last 2 days.  Patient needed to use the restroom (unfortunately, husband already stepped out on an errand), on returning from the restroom, while trying to sit down, she accidentally fell with her right hip on the walker, thereby sustaining pain rated as 10/10 on pain scale.  Pain worsens with leg movement, she denies hitting her head or loss of consciousness and also denies chest pain, shortness of breath, headache, neck stiffness, fever, chills, nausea, vomiting or abdominal pain.   ED Course:  In the emergency department, temperature was 98.1, patient was tachycardic and tachypneic, BP 150/64, O2 sat 90% on room air.  Work-up in the ED showed normocytic anemia and normal BMP except for hyponatremia, bicarb 20, glucose 135. CT of left lower extremity without contrast showed: 1. Left hip arthroplasty appears in anatomic alignment. 2. Acute comminuted fracture of the mid and proximal femur from the level of the greater trochanter to the level of the mid diaphysis. 2 cm distal to the femoral component of the arthroplasty there is a displaced angulated fracture with overlap. Right hip x-ray showed perihardware proximal left femoral fractures with distal fragment  dorsal overriding and medial angulation, with the greater trochanter fracture only slightly displaced. Osteopenia and degenerative change. No dislocation. She was treated with IV fentanyl 75 mcg, Dilaudid, morphine, Zofran. Dr. Percell Miller of orthopedics was consulted and recommended admitting patient to [facility] initially, but this was eventually changed to [facility] per ED physician.  Hospitalist was asked to admit patient for further evaluation and management.  Review of Systems: Review of systems as noted in the HPI. All other systems reviewed and are negative.   Past Medical History:  Diagnosis Date   Allergic rhinitis    using Nasocort daily and Allegra daily   Anemia    Arthritis    "hands" (08/01/2013)   Bronchiectasis    uses Albuterol daily as needed but has been a while since used(more than a month ago),   Chronic cough    COPD (chronic obstructive pulmonary disease) ([place])    Diverticulosis    Dyspnea    with exertion   GERD (gastroesophageal reflux disease)    takes Prilosec daily   History of bladder infections    takes Keflex daily;Dr.Ottin is Urologist   History of MRSA infection    several yrs ago   Joint pain    Mycobacterium avium complex ([place])    Osteoporosis    Osteoporosis    takes Fosamax weekly   Pneumonia 12/02/1988   Zenker's diverticulum    Past Surgical History:  Procedure Laterality Date   ANTERIOR CERVICAL DECOMP/DISCECTOMY FUSION  ~ 1992   BREAST BIOPSY Left    COLONOSCOPY     COLONOSCOPY N/A 09/16/2015  Procedure: COLONOSCOPY;  Surgeon: Daneil Dolin, MD;  Location: [facility];  Service: Endoscopy;  Laterality: N/A;  11:00 Am   ESOPHAGOGASTRODUODENOSCOPY     TOTAL HIP ARTHROPLASTY Left 11/22/2021   Procedure: TOTAL HIP ARTHROPLASTY ANTERIOR APPROACH;  Surgeon: Renette Butters, MD;  Location: [facility];  Service: Orthopedics;  Laterality: Left;   VIDEO BRONCHOSCOPY Bilateral 10/13/2016   Procedure: VIDEO BRONCHOSCOPY WITHOUT FLUORO;   Surgeon: Collene Gobble, MD;  Location: [facility];  Service: Cardiopulmonary;  Laterality: Bilateral;   ZENKER'S DIVERTICULECTOMY ENDOSCOPIC  08/01/2013    Social History:  reports that she has never smoked. She has never used smokeless tobacco. She reports current alcohol use. She reports that she does not use drugs.   Allergies  Allergen Reactions   Levaquin [Levofloxacin] Other (See Comments)    Hip pain   Rifadin [Rifampin] Other (See Comments)    Flu-like symptoms   Bactrim [Sulfamethoxazole-Trimethoprim] Rash   Vibramycin [Doxycycline] Rash    Splotches on skin    Family History  Problem Relation Age of Onset   Allergies Mother    Heart disease Mother    Heart disease Father    Stroke Father      Prior to Admission medications   Medication Sig Start Date End Date Taking? Authorizing Provider  acetaminophen (TYLENOL) 500 MG tablet Take 2 tablets (1,000 mg total) by mouth every 8 (eight) hours as needed for mild pain or moderate pain. 11/22/21   Britt Bottom, PA-C  amoxicillin-clavulanate (AUGMENTIN) 875-125 MG tablet Take 1 tablet by mouth as directed. Take 1 tablet by mouth twice daily for 7 days (one week out of each month every other month--rotating with biaxin antibiotic every other month)    [provider]  aspirin EC 81 MG tablet Take 1 tablet (81 mg total) by mouth 2 (two) times daily. To prevent blood clots for 30 days after surgery. 11/22/21   Britt Bottom, PA-C  CALCIUM-VITAMIN D-VITAMIN K PO Take 1 tablet by mouth in the morning.    [provider]  clarithromycin (BIAXIN) 500 MG tablet Take 250 mg by mouth See admin instructions. Take 0.5 tablet (250 mg) by mouth twice daily for 7 days (one week out of each month every other month--rotating with augmentin antibiotic every other month)    [provider]  D-Mannose POWD Take 2 g by mouth at bedtime.    [provider]  fexofenadine (ALLEGRA) 180 MG tablet Take 180 mg by  mouth in the morning.    [provider]  fluticasone (FLONASE) 50 MCG/ACT nasal spray Place 1 spray into both nostrils in the morning.    [provider]  gabapentin (NEURONTIN) 100 MG capsule Take two capsules at bedtime 11/14/21   Narda Amber K, DO  HYDROcodone-acetaminophen (NORCO) 10-325 MG tablet Take 1-2 tablets by mouth every 6 (six) hours as needed for severe pain. 11/22/21   Britt Bottom, PA-C  meloxicam (MOBIC) 15 MG tablet Take 1 tablet (15 mg total) by mouth daily as needed for pain (and inflammation). 11/22/21   Britt Bottom, PA-C  methocarbamol (ROBAXIN-750) 750 MG tablet Take 1 tablet (750 mg total) by mouth every 8 (eight) hours as needed for muscle spasms. 11/22/21   Britt Bottom, PA-C  Multiple Vitamin (MULTIVITAMIN WITH MINERALS) TABS tablet Take 1 tablet by mouth in the morning.    [provider]  ondansetron (ZOFRAN-ODT) 4 MG disintegrating tablet Take 1 tablet (4 mg total) by mouth  2 (two) times daily as needed for nausea or vomiting. 11/22/21   Aggie Moats M, PA-C  Polyethyl Glycol-Propyl Glycol (LUBRICANT EYE DROPS) 0.4-0.3 % SOLN Place 1 drop into both eyes 3 (three) times daily as needed (dry/irritated eyes.).    [provider]  sodium chloride (OCEAN) 0.65 % SOLN nasal spray Place 1 spray into both nostrils at bedtime.    [provider]  sodium chloride, hypertonic, 3 % solution INHALE 3ML VIA NEBULIZER TWICE DAILY Patient taking differently: 3 mLs daily. 07/04/21   Collene Gobble, MD  VENTOLIN HFA 108 (90 Base) MCG/ACT inhaler inhale TWO puffs into THE lungs EVERY 6 HOURS AS NEEDED FOR wheezing OR SHORTNESS OF BREATH 11/21/21   Cook, Wellington G, DO  Wheat Dextrin (BENEFIBER) POWD Take 1 Dose by mouth in the morning.    [provider]    Physical Exam: BP (!) 161/79   Pulse (!) 124   Temp 97.9 F (36.6 C) (Oral)   Resp (!) 26   Ht _0  (1.626 m)   Wt 47.2 kg   SpO2 99%   BMI 17.85 kg/m   General: 76  y.o. year-old female ill appearing, but in no acute distress.  Alert and oriented x3. HEENT: NCAT, EOMI Neck: Supple, trachea medial Cardiovascular: Regular rate and rhythm with no rubs or gallops.  No thyromegaly or JVD noted.  No lower extremity edema. 2/4 pulses in all 4 extremities. Respiratory: Clear to auscultation with no wheezes or rales. Good inspiratory effort. Abdomen: Soft, nontender nondistended with normal bowel sounds x4 quadrants. Muskuloskeletal: Tender to palpation of left hip.  Left ankle swelling.  Decreased ROM due to pain. Neuro: CN II-XII intact, sensation, reflexes intact Skin: No ulcerative lesions noted or rashes.  Dry and warm Psychiatry: Judgement and insight appear normal. Mood is appropriate for condition and setting          Labs on Admission:  Basic Metabolic Panel: Recent Labs  Lab 11/26/21 1817  NA 131*  K 3.9  CL 103  CO2 20*  GLUCOSE 135*  BUN 18  CREATININE 0.46  CALCIUM 7.9*   Liver Function Tests: No results for input(s): "AST", "ALT", "ALKPHOS", "BILITOT", "PROT", "ALBUMIN" in the last 168 hours. No results for input(s): "LIPASE", "AMYLASE" in the last 168 hours. No results for input(s): "AMMONIA" in the last 168 hours. CBC: Recent Labs  Lab 11/26/21 1817  WBC 7.1  NEUTROABS 5.6  HGB 10.8*  HCT 32.4*  MCV 92.8  PLT 226   Cardiac Enzymes: No results for input(s): "CKTOTAL", "CKMB", "CKMBINDEX", "TROPONINI" in the last 168 hours.  BNP (last 3 results) No results for input(s): "BNP" in the last 8760 hours.  ProBNP (last 3 results) No results for input(s): "PROBNP" in the last 8760 hours.  CBG: No results for input(s): "GLUCAP" in the last 168 hours.  Radiological Exams on Admission: DG Hip Unilat W or Wo Pelvis 2-3 Views Left  Result Date: 11/26/2021 CLINICAL DATA:  Left hip replacement four days ago, fell today with increasing pain. EXAM: DG HIP (WITH OR WITHOUT PELVIS) 2-3V LEFT; LEFT FEMUR 2 VIEWS COMPARISON:  CT left  femur earlier today. FINDINGS: There is osteopenia.  Left hip arthroplasty again noted. No hardware dislocation. Comminuted proximal left femoral fracture again is seen. This consists of a longitudinal fracture through the greater trochanter alongside the hardware, with the fragment displaced 6 mm laterally and slightly cephalad displaced, and an oblique proximal shaft fracture alongside the distal femoral hardware stem.  The main distal fracture fragment is angled medially, laterally displaced by about 1 cortex width and with dorsal overriding of up to 7.5 cm on some images. There is a linear track of soft tissue air at the fat muscle interface in the upper thigh which is probably residual from the hip replacement surgery. Moderately advanced right hip DJD is incidentally noted, and healed fracture deformities of the left pubic rami. IMPRESSION: Perihardware proximal left femoral fractures with distal fragment dorsal overriding and medial angulation, with the greater trochanter fracture only slightly displaced. Osteopenia and degenerative change. No dislocation. Electronically Signed   By: Telford Nab M.D.   On: 11/26/2021 20:19   DG Femur Min 2 Views Left  Result Date: 11/26/2021 CLINICAL DATA:  Left hip replacement four days ago, fell today with increasing pain. EXAM: DG HIP (WITH OR WITHOUT PELVIS) 2-3V LEFT; LEFT FEMUR 2 VIEWS COMPARISON:  CT left femur earlier today. FINDINGS: There is osteopenia.  Left hip arthroplasty again noted. No hardware dislocation. Comminuted proximal left femoral fracture again is seen. This consists of a longitudinal fracture through the greater trochanter alongside the hardware, with the fragment displaced 6 mm laterally and slightly cephalad displaced, and an oblique proximal shaft fracture alongside the distal femoral hardware stem. The main distal fracture fragment is angled medially, laterally displaced by about 1 cortex width and with dorsal overriding of up to 7.5 cm on  some images. There is a linear track of soft tissue air at the fat muscle interface in the upper thigh which is probably residual from the hip replacement surgery. Moderately advanced right hip DJD is incidentally noted, and healed fracture deformities of the left pubic rami. IMPRESSION: Perihardware proximal left femoral fractures with distal fragment dorsal overriding and medial angulation, with the greater trochanter fracture only slightly displaced. Osteopenia and degenerative change. No dislocation. Electronically Signed   By: Telford Nab M.D.   On: 11/26/2021 20:19   CT FEMUR LEFT WO CONTRAST  Result Date: 11/26/2021 CLINICAL DATA:  Hip trauma. EXAM: CT OF THE LOWER LEFT EXTREMITY WITHOUT CONTRAST TECHNIQUE: Multidetector CT imaging of the lower left extremity was performed according to the standard protocol. RADIATION DOSE REDUCTION: This exam was performed according to the departmental dose-optimization program which includes automated exposure control, adjustment of the mA and/or kV according to patient size and/or use of iterative reconstruction technique. COMPARISON:  None Available. FINDINGS: Bones/Joint/Cartilage There is a left hip arthroplasty present in anatomic alignment. There is a comminuted nondisplaced fracture in the region of the greater trochanter. There is oblique displaced fracture extending from the level of the greater trochanter two proximally 2 cm distal to the tip of the femoral prosthesis. This fracture is displaced distal to the level of the prosthesis with apex lateral angulation and 4.5 cm of overlap. There is no dislocation. Ligaments Suboptimally assessed by CT. Muscles and Tendons There is intramuscular edema surrounding the fracture sites. There is a small amount of intramuscular air anterior to the hip joint. Soft tissues There is subcutaneous edema of the anterior thigh. Bladder is markedly distended. IMPRESSION: 1. Left hip arthroplasty appears in anatomic alignment.  2. Acute comminuted fracture of the mid and proximal femur from the level of the greater trochanter to the level of the mid diaphysis. 2 cm distal to the femoral component of the arthroplasty there is a displaced angulated fracture with overlap. Electronically Signed   By: Ronney Asters M.D.   On: 11/26/2021 18:38   CT Hip Right Wo Contrast  Result Date: 11/26/2021 CLINICAL DATA:  Fall, right hip pain EXAM: CT OF THE RIGHT HIP WITHOUT CONTRAST TECHNIQUE: Multidetector CT imaging of the right hip was performed according to the standard protocol. Multiplanar CT image reconstructions were also generated. RADIATION DOSE REDUCTION: This exam was performed according to the departmental dose-optimization program which includes automated exposure control, adjustment of the mA and/or kV according to patient size and/or use of iterative reconstruction technique. COMPARISON:  Pelvic radiograph dated 11/26/2021 FINDINGS: No fracture or dislocation is seen. Moderate degenerative changes of the right hip. Visualized bony pelvis appears intact. Visualized soft tissue pelvis is unremarkable, noting bladder distension and colonic diverticulosis. IMPRESSION: Moderate degenerative changes of the right hip. No fracture or dislocation is seen. Electronically Signed   By: Julian Hy M.D.   On: 11/26/2021 18:31   DG Pelvis 1-2 Views  Result Date: 11/26/2021 CLINICAL DATA:  Status post left hip arthroplasty. EXAM: PELVIS - 1 VIEW COMPARISON:  None Available. FINDINGS: Exam is technically suboptimal due to patient's inability to cooperate with positioning. A bipolar left hip prosthesis is seen in expected position. Old fracture deformity of left superior pubic ramus noted. Severe right hip osteoarthritis also seen. IMPRESSION: Technically suboptimal exam. Bipolar left hip prosthesis in expected position. Severe right hip osteoarthritis. Electronically Signed   By: Marlaine Hind M.D.   On: 11/26/2021 15:49    EKG: I  independently viewed the EKG done and my findings are as followed: Sinus tachycardia at a rate of 128 bpm  Assessment/Plan Present on Admission:  Closed left femoral fracture (HCC)  S/P total left hip arthroplasty  Principal Problem:   Closed left femoral fracture ([place]) Active Problems:   Bronchiectasis ([place])   S/P total left hip arthroplasty   Fall at home, initial encounter  Left femoral fracture CT of left lower extremity showed acute comminuted fracture of the mid and proximal femur from the level of the greater trochanter to the level of the mid diaphysis She was treated with fentanyl, Dilaudid, morphine Continue IV Dilaudid 0.5 mg every 3 hours as needed Continue IV Protonix 40 mg daily to prevent stress-induced ulcer Patient will be placed n.p.o. at midnight in aspirin for possible surgical intervention in morning Continue fall precaution and neurochecks Consider PT/OT eval and treat status post surgical repair Orthopedic physician (Dr. Percell Miller) was already consulted and recommended admitting patient to [facility] by ED physician  Fall at home Continue fall precaution and neurochecks  Osteoarthritis of left hip s/p total left hip arthroplasty (11/22/2021) Pelvis x-ray showed Bipolar left hip prosthesis in expected position.  Hyponatremia Na 131; continue IV hydration Continue to monitor sodium level with morning labs  Bronchiectasis Continue ventilating as needed  DVT prophylaxis: SCDs  Code Status: Full code  Consults: Orthopedic surgery (by ED physician)  Family Communication: Husband at bedside (all questions answered to satisfaction)  Severity of Illness: The appropriate patient status for this patient is INPATIENT. Inpatient status is judged to be reasonable and necessary in order to provide the required intensity of service to ensure the patient's safety. The patient's presenting symptoms, physical exam findings, and initial radiographic and laboratory data  in the context of their chronic comorbidities is felt to place them at high risk for further clinical deterioration. Furthermore, it is not anticipated that the patient will be medically stable for discharge from the hospital within 2 midnights of admission.   * I certify that at the point of admission it is my clinical judgment that the patient will  require inpatient hospital care spanning beyond 2 midnights from the point of admission due to high intensity of service, high risk for further deterioration and high frequency of surveillance required.*  Author: Bernadette Hoit, DO 11/26/2021 10:24 PM  For on call review [email or web address].

## 2021-11-26 NOTE — ED Provider Notes (Signed)
[name] EMERGENCY DEPARTMENT Provider Note   CSN: [id number] Arrival date & time: 11/26/21  1352     History  Chief Complaint  Patient presents with   Mary [name] is a 76 y.o. female with Hx of osteoporosis, Zenker's diverticulum, primary osteoarthritis of bilateral hips, GERD.  Recently underwent left THA on 11/22/2021 without complications.  Has been doing well with physical therapy and using walker for the last 2 days.  Patient's husband stepped out for a quick errand, came back and the patient had fell on on her right hip.  Per patient, had gone to use the bathroom, was on her way back, went to sit down and fell onto her right hip.  Now complaining of of bilateral hip pain, rated 10 out of 10, with left worse than the right.  Patient states it is difficult to move due to the pain.  Also complaining of left calf pain and warmth.  Not on anticoagulation.  Takes bASA daily.  Denies hitting head or losing consciousness.  Denies recent fever, chills, chest pain, headache, neck stiffness, or abdominal pain.  The history is provided by the patient and medical records.     Home Medications Prior to Admission medications   Medication Sig Start Date End Date Taking? Authorizing Provider  acetaminophen (TYLENOL) 500 MG tablet Take 2 tablets (1,000 mg total) by mouth every 8 (eight) hours as needed for mild pain or moderate pain. 11/22/21   Jenne Pane, PA-C  amoxicillin-clavulanate (AUGMENTIN) 875-125 MG tablet Take 1 tablet by mouth as directed. Take 1 tablet by mouth twice daily for 7 days (one week out of each month every other month--rotating with biaxin antibiotic every other month)    [provider]  aspirin EC 81 MG tablet Take 1 tablet (81 mg total) by mouth 2 (two) times daily. To prevent blood clots for 30 days after surgery. 11/22/21   Jenne Pane, PA-C  CALCIUM-VITAMIN D-VITAMIN K PO Take 1 tablet by mouth in the morning.    [provider]   clarithromycin (BIAXIN) 500 MG tablet Take 250 mg by mouth See admin instructions. Take 0.5 tablet (250 mg) by mouth twice daily for 7 days (one week out of each month every other month--rotating with augmentin antibiotic every other month)    [provider]  D-Mannose POWD Take 2 g by mouth at bedtime.    [provider]  fexofenadine (ALLEGRA) 180 MG tablet Take 180 mg by mouth in the morning.    [provider]  fluticasone (FLONASE) 50 MCG/ACT nasal spray Place 1 spray into both nostrils in the morning.    [provider]  gabapentin (NEURONTIN) 100 MG capsule Take two capsules at bedtime 11/14/21   Nita Sickle K, DO  HYDROcodone-acetaminophen (NORCO) 10-325 MG tablet Take 1-2 tablets by mouth every 6 (six) hours as needed for severe pain. 11/22/21   Jenne Pane, PA-C  meloxicam (MOBIC) 15 MG tablet Take 1 tablet (15 mg total) by mouth daily as needed for pain (and inflammation). 11/22/21   Jenne Pane, PA-C  methocarbamol (ROBAXIN-750) 750 MG tablet Take 1 tablet (750 mg total) by mouth every 8 (eight) hours as needed for muscle spasms. 11/22/21   Jenne Pane, PA-C  Multiple Vitamin (MULTIVITAMIN WITH MINERALS) TABS tablet Take 1 tablet by mouth in the morning.    [provider]  ondansetron (ZOFRAN-ODT) 4 MG disintegrating tablet Take 1 tablet (4 mg total) by  mouth 2 (two) times daily as needed for nausea or vomiting. 11/22/21   Levester Fresh M, PA-C  Polyethyl Glycol-Propyl Glycol (LUBRICANT EYE DROPS) 0.4-0.3 % SOLN Place 1 drop into both eyes 3 (three) times daily as needed (dry/irritated eyes.).    [provider]  sodium chloride (OCEAN) 0.65 % SOLN nasal spray Place 1 spray into both nostrils at bedtime.    [provider]  sodium chloride, hypertonic, 3 % solution INHALE VIA NEBULIZER TWICE DAILY Patient taking differently: 3 mLs daily. 07/04/21   Leslye Peer, MD  VENTOLIN HFA 108 (90 Base) MCG/ACT inhaler  inhale TWO puffs into THE lungs EVERY 6 HOURS AS NEEDED FOR wheezing OR SHORTNESS OF BREATH 11/21/21   Cook, Union G, DO  Wheat Dextrin (BENEFIBER) POWD Take 1 Dose by mouth in the morning.    [provider]      Allergies    Levaquin [levofloxacin], Rifadin [rifampin], Bactrim [sulfamethoxazole-trimethoprim], and Vibramycin [doxycycline]    Review of Systems   Review of Systems  Musculoskeletal:        Bilateral hip pain    Physical Exam Updated Vital Signs BP (!) 175/94   Pulse (!) 122   Temp (!) 97.3 F (36.3 C) (Oral)   Resp (!) 22   Ht 5\' 4"  (1.626 m)   Wt 47.2 kg   SpO2 94%   BMI 17.85 kg/m  Physical Exam Vitals and nursing note reviewed.  Constitutional:      General: She is not in acute distress.    Appearance: She is well-developed. She is ill-appearing. She is not toxic-appearing or diaphoretic.  HENT:     Head: Normocephalic and atraumatic.  Eyes:     Conjunctiva/sclera: Conjunctivae normal.  Cardiovascular:     Rate and Rhythm: Normal rate and regular rhythm.     Pulses:          Radial pulses are 2+ on the right side and 2+ on the left side.       Dorsalis pedis pulses are 2+ on the right side and 2+ on the left side.     Heart sounds: No murmur heard.    Comments: Tenderness, warmth, and mild swelling of the left calf. Pulmonary:     Effort: Pulmonary effort is normal. Tachypnea present. No respiratory distress.     Breath sounds: Normal breath sounds. No wheezing or rales.  Chest:     Chest wall: No tenderness.  Abdominal:     Palpations: Abdomen is soft.     Tenderness: There is no abdominal tenderness.  Musculoskeletal:        General: Swelling, tenderness, deformity and signs of injury present.     Cervical back: Neck supple. No rigidity.     Comments: ROM deferred due to possibility of fracture/dislocation.  Bony tenderness of right hip.  Tenderness and possible dislocation appreciated of the left hip.  Bruising and warmth of the left  leg, with postsurgical bandage in place.  Skin:    General: Skin is warm and dry.     Capillary Refill: Capillary refill takes less than 2 seconds.  Neurological:     Mental Status: She is alert and oriented to person, place, and time.  Psychiatric:        Mood and Affect: Mood normal.     ED Results / Procedures / Treatments   Labs (all labs ordered are listed, but only abnormal results are displayed) Labs Reviewed  BASIC METABOLIC PANEL - Abnormal; Notable  for the following components:      Result Value   Sodium 131 (*)    CO2 20 (*)    Glucose, Bld 135 (*)    Calcium 7.9 (*)    All other components within normal limits  CBC WITH DIFFERENTIAL/PLATELET - Abnormal; Notable for the following components:   RBC 3.49 (*)    Hemoglobin 10.8 (*)    HCT 32.4 (*)    Abs Immature Granulocytes 0.09 (*)    All other components within normal limits    EKG EKG Interpretation  Date/Time:  Saturday November 26 2021 17:34:08 EDT Ventricular Rate:  128 PR Interval:  153 QRS Duration: 76 QT Interval:  303 QTC Calculation: 443 R Axis:   70 Text Interpretation: Sinus tachycardia Confirmed by Alvester Chourifan, Matthew [phone]([id number]) on 11/26/2021 6:10:28 PM  Radiology CT FEMUR LEFT WO CONTRAST  Result Date: 11/26/2021 CLINICAL DATA:  Hip trauma. EXAM: CT OF THE LOWER LEFT EXTREMITY WITHOUT CONTRAST TECHNIQUE: Multidetector CT imaging of the lower left extremity was performed according to the standard protocol. RADIATION DOSE REDUCTION: This exam was performed according to the departmental dose-optimization program which includes automated exposure control, adjustment of the mA and/or kV according to patient size and/or use of iterative reconstruction technique. COMPARISON:  None Available. FINDINGS: Bones/Joint/Cartilage There is a left hip arthroplasty present in anatomic alignment. There is a comminuted nondisplaced fracture in the region of the greater trochanter. There is oblique displaced fracture extending  from the level of the greater trochanter two proximally 2 cm distal to the tip of the femoral prosthesis. This fracture is displaced distal to the level of the prosthesis with apex lateral angulation and 4.5 cm of overlap. There is no dislocation. Ligaments Suboptimally assessed by CT. Muscles and Tendons There is intramuscular edema surrounding the fracture sites. There is a small amount of intramuscular air anterior to the hip joint. Soft tissues There is subcutaneous edema of the anterior thigh. Bladder is markedly distended. IMPRESSION: 1. Left hip arthroplasty appears in anatomic alignment. 2. Acute comminuted fracture of the mid and proximal femur from the level of the greater trochanter to the level of the mid diaphysis. 2 cm distal to the femoral component of the arthroplasty there is a displaced angulated fracture with overlap. Electronically Signed   By: Darliss CheneyAmy  Guttmann M.D.   On: 11/26/2021 18:38   CT Hip Right Wo Contrast  Result Date: 11/26/2021 CLINICAL DATA:  Fall, right hip pain EXAM: CT OF THE RIGHT HIP WITHOUT CONTRAST TECHNIQUE: Multidetector CT imaging of the right hip was performed according to the standard protocol. Multiplanar CT image reconstructions were also generated. RADIATION DOSE REDUCTION: This exam was performed according to the departmental dose-optimization program which includes automated exposure control, adjustment of the mA and/or kV according to patient size and/or use of iterative reconstruction technique. COMPARISON:  Pelvic radiograph dated 11/26/2021 FINDINGS: No fracture or dislocation is seen. Moderate degenerative changes of the right hip. Visualized bony pelvis appears intact. Visualized soft tissue pelvis is unremarkable, noting bladder distension and colonic diverticulosis. IMPRESSION: Moderate degenerative changes of the right hip. No fracture or dislocation is seen. Electronically Signed   By: Charline BillsSriyesh  Krishnan M.D.   On: 11/26/2021 18:31   DG Pelvis 1-2  Views  Result Date: 11/26/2021 CLINICAL DATA:  Status post left hip arthroplasty. EXAM: PELVIS - 1 VIEW COMPARISON:  None Available. FINDINGS: Exam is technically suboptimal due to patient's inability to cooperate with positioning. A bipolar left hip prosthesis is seen in expected position.  Old fracture deformity of left superior pubic ramus noted. Severe right hip osteoarthritis also seen. IMPRESSION: Technically suboptimal exam. Bipolar left hip prosthesis in expected position. Severe right hip osteoarthritis. Electronically Signed   By: Danae Orleans M.D.   On: 11/26/2021 15:49    Procedures Procedures    Medications Ordered in ED Medications  HYDROmorphone (DILAUDID) injection 1 mg (1 mg Intravenous Given 11/26/21 1509)  fentaNYL (SUBLIMAZE) injection 75 mcg (75 mcg Intravenous Given 11/26/21 1640)  morphine (PF) 4 MG/ML injection 4 mg (4 mg Intravenous Given 11/26/21 1747)  ondansetron (ZOFRAN) injection 4 mg (4 mg Intravenous Given 11/26/21 1747)    ED Course/ Medical Decision Making/ A&P Clinical Course as of 11/26/21 1952  Sat Nov 26, 2021  1540 Notified by RT that patient was unable to cooperate with x-ray imaging modalities, plan to proceed with CT imaging [AC]  969 76 year old female presenting from home with a fall onto her left hip, history of a left hip replacement Dr. Eulah Pont, here she was found to have a mid femur fracture on CT imaging.  She required several doses of pain medications for pain control.  Subsequently the pain appeared to be adequately controlled.  Pulses are intact distally.  The patient family are updated regarding need for readmission to the hospital.  PA provider to discuss with orthopedic surgeon on-call, patient may require transfer to [facility] [[place]]  1921 Consulted with Dr. Eulah Pont of orthopedics and performing surgeon.  Discussed patient's case in detail.  He requested obtaining x-rays to check alignment, possibly using sedation to get said images if necessary.   Also recommended prompt transfer to [facility] for further treatment. [AC]    Clinical Course User Index [AC] Cecil Cobbs, PA-C [[place]] Renaye Rakers Kermit Balo, MD                           Medical Decision Making Amount and/or Complexity of Data Reviewed Labs: ordered. Radiology: ordered.  Risk Prescription drug management.   76 y.o. female presents to the ED for concern of Fall     This involves an extensive number of treatment options, and is a complaint that carries with it a high risk of complications and morbidity.  The emergent differential diagnosis prior to evaluation includes, but is not limited to: fracture, dislocation, contusion  This is not an exhaustive differential.   Past Medical History / Co-morbidities / Social History: Hx of osteoporosis, Zenker's diverticulum, primary osteoarthritis of bilateral hips, GERD.  Recently underwent left THA on 11/22/2021 without complications. Social Determinants of Health include: Elderly  Additional History:  Obtained by chart review.  Notably surgical notes from recent left THA, see for details  Lab Tests: I ordered, and personally interpreted labs.  The pertinent results include:   Mild decreased H&H 10.8/32.4 from 2 weeks ago Mild hyponatremia 131  Imaging Studies: I ordered imaging studies including CT left and right hips.   I independently visualized and interpreted imaging which showed comminuted left femoral fracture I agree with the radiologist interpretation.  Cardiac Monitoring: The patient was maintained on a cardiac monitor.  I personally viewed and interpreted the cardiac monitored which showed an underlying rhythm of: Sinus tachycardia  ED Course / Critical Interventions: Pt mildly ill-appearing on exam.  Presenting with bilateral hip pain following a fall couple hours ago.  Recent left THA 4 days ago without complications.  Was walking back from the bathroom with her walker, and when trying to sit  down she  missed the couch and fell onto her right hip.  Left hip pain worse than right hip pain.  Patient appears significantly uncomfortable, afebrile, satting at 97%.  Also with mild warmth, swelling, tenderness of the left calf.  Nonseptic, nontoxic-appearing in NAD.  After Dilaudid and fentanyl, patient still rates pain 10/10.  Informed by RT that patient unable to cooperate to obtain x-ray imaging, plan to proceed with CT imaging.  Morphine ordered. Upon reevaluation, patient still in significant pain.  CT imaging indicates comminuted fracture of the left femur.  Consultation placed for orthopedic surgery, performing surgeon Dr. Eulah Pont on-call at [facility]  That is obviously just listening to MRI following. Consulted with Dr. Eulah Pont of orthopedics, who requested x-ray imaging, then rediscussion of transfer to [facility] for further treatment. I have reviewed the patients home medicines and have made adjustments as needed.  Disposition: 1930  care of GIAH FICKETT transferred to Dr. Renaye Rakers at the end of my shift as the patient will require reassessment once labs/imaging have resulted.  Patient presentation, ED course, and plan of care discussed with review of all pertinent labs and imaging.  Please see his/her note for further details regarding further ED course and disposition.  Plan at time of handoff is pending x-rays and read discussing patient with orthopedics.  Likely plan still to transfer to [facility] for further treatment.  This may be altered or completely changed at the discretion of the oncoming team pending results of further workup.  I discussed this case with my attending, Dr. Renaye Rakers, who agreed with the proposed treatment course and cosigned this note including patient's presenting symptoms, physical exam, and planned diagnostics and interventions.  Attending physician stated agreement with plan or made changes to plan which were implemented.     This chart was dictated using voice  recognition software.  Despite best efforts to proofread, errors can occur which can change the documentation meaning.         Final Clinical Impression(s) / ED Diagnoses Final diagnoses:  Closed displaced comminuted fracture of shaft of left femur, initial encounter [facility])   Rx / DC Orders ED Discharge Orders     None         Cecil Cobbs, PA-C 11/26/21 1957    Franne Forts, DO 12/02/21 1624

## 2021-11-26 NOTE — ED Triage Notes (Signed)
"  Had left hip replacement on 8/22. Today fell on right hip and states it hurts really bad now to move her legs. She is afraid she may have messed her hip up again" per EMS Had a total of of fentanyl in route.

## 2021-11-27 DIAGNOSIS — S7292XS Unspecified fracture of left femur, sequela: Secondary | ICD-10-CM

## 2021-11-27 DIAGNOSIS — Y92009 Unspecified place in unspecified non-institutional (private) residence as the place of occurrence of the external cause: Secondary | ICD-10-CM | POA: Diagnosis not present

## 2021-11-27 DIAGNOSIS — J479 Bronchiectasis, uncomplicated: Secondary | ICD-10-CM | POA: Diagnosis not present

## 2021-11-27 DIAGNOSIS — W19XXXA Unspecified fall, initial encounter: Secondary | ICD-10-CM | POA: Diagnosis not present

## 2021-11-27 LAB — COMPREHENSIVE METABOLIC PANEL
ALT: 26 U/L (ref 0–44)
AST: 38 U/L (ref 15–41)
Albumin: 3.3 g/dL — ABNORMAL LOW (ref 3.5–5.0)
Alkaline Phosphatase: 100 U/L (ref 38–126)
Anion gap: 7 (ref 5–15)
BUN: 26 mg/dL — ABNORMAL HIGH (ref 8–23)
CO2: 23 mmol/L (ref 22–32)
Calcium: 8.2 mg/dL — ABNORMAL LOW (ref 8.9–10.3)
Chloride: 105 mmol/L (ref 98–111)
Creatinine, Ser: 0.6 mg/dL (ref 0.44–1.00)
GFR, Estimated: >60 mL/min (ref >60)
Glucose, Bld: 114 mg/dL — ABNORMAL HIGH (ref 70–99)
Potassium: 4.9 mmol/L (ref 3.5–5.1)
Sodium: 135 mmol/L (ref 135–145)
Total Bilirubin: 1.2 mg/dL (ref 0.3–1.2)
Total Protein: 6.2 g/dL — ABNORMAL LOW (ref 6.5–8.1)

## 2021-11-27 LAB — MAGNESIUM: Magnesium: 2.2 mg/dL (ref 1.7–2.4)

## 2021-11-27 LAB — CBC
HCT: 32.3 % — ABNORMAL LOW (ref 36.0–46.0)
Hemoglobin: 10.7 g/dL — ABNORMAL LOW (ref 12.0–15.0)
MCH: 30.7 pg (ref 26.0–34.0)
MCHC: 33.1 g/dL (ref 30.0–36.0)
MCV: 92.6 fL (ref 80.0–100.0)
Platelets: 268 10*3/uL (ref 150–400)
RBC: 3.49 MIL/uL — ABNORMAL LOW (ref 3.87–5.11)
RDW: 13.4 % (ref 11.5–15.5)
WBC: 8.5 10*3/uL (ref 4.0–10.5)
nRBC: 0 % (ref 0.0–0.2)

## 2021-11-27 LAB — PHOSPHORUS: Phosphorus: 3.7 mg/dL (ref 2.5–4.6)

## 2021-11-27 MED ORDER — METHOCARBAMOL 500 MG PO TABS
750.0000 mg | ORAL_TABLET | Freq: Three times a day (TID) | ORAL | Status: DC | PRN
  Administered 2021-11-27 – 2021-12-02 (×9): 750 mg via ORAL
  Filled 2021-11-27 (×10): qty 2

## 2021-11-27 MED ORDER — TRAMADOL HCL 50 MG PO TABS
50.0000 mg | ORAL_TABLET | Freq: Four times a day (QID) | ORAL | Status: DC
  Administered 2021-11-27 – 2021-12-02 (×17): 50 mg via ORAL
  Filled 2021-11-27 (×18): qty 1

## 2021-11-27 MED ORDER — ALBUTEROL SULFATE HFA 108 (90 BASE) MCG/ACT IN AERS: 2.0000 | INHALATION_SPRAY | Freq: Four times a day (QID) | RESPIRATORY_TRACT | Status: DC | PRN

## 2021-11-27 MED ORDER — SODIUM CHLORIDE 3 % IN NEBU
4.0000 mL | INHALATION_SOLUTION | Freq: Every day | RESPIRATORY_TRACT | Status: AC
  Administered 2021-11-29: 4 mL via RESPIRATORY_TRACT
  Filled 2021-11-27 (×3): qty 4

## 2021-11-27 MED ORDER — OXYCODONE HCL 5 MG PO TABS
5.0000 mg | ORAL_TABLET | Freq: Four times a day (QID) | ORAL | Status: DC | PRN
  Administered 2021-11-27 – 2021-12-02 (×7): 10 mg via ORAL
  Filled 2021-11-27 (×7): qty 2
  Filled 2021-11-27: qty 1

## 2021-11-27 MED ORDER — ACETAMINOPHEN 500 MG PO TABS
500.0000 mg | ORAL_TABLET | Freq: Three times a day (TID) | ORAL | Status: DC
  Administered 2021-11-27 – 2021-12-02 (×11): 500 mg via ORAL
  Filled 2021-11-27 (×14): qty 1

## 2021-11-27 MED ORDER — METHOCARBAMOL 1000 MG/10ML IJ SOLN
500.0000 mg | Freq: Once | INTRAVENOUS | Status: AC
  Administered 2021-11-27: 500 mg via INTRAVENOUS
  Filled 2021-11-27: qty 500

## 2021-11-27 MED ORDER — METHOCARBAMOL 1000 MG/10ML IJ SOLN: 500.0000 mg | Freq: Three times a day (TID) | INTRAVENOUS | Status: DC | PRN

## 2021-11-27 MED ORDER — GABAPENTIN 100 MG PO CAPS
100.0000 mg | ORAL_CAPSULE | Freq: Every day | ORAL | Status: DC
  Administered 2021-11-27 – 2021-12-01 (×5): 100 mg via ORAL
  Filled 2021-11-27 (×5): qty 1

## 2021-11-27 MED ORDER — HYDROMORPHONE HCL 1 MG/ML IJ SOLN
0.5000 mg | INTRAMUSCULAR | Status: DC | PRN
  Administered 2021-11-27 (×2): 0.5 mg via INTRAVENOUS
  Filled 2021-11-27 (×2): qty 0.5

## 2021-11-27 MED ORDER — HYDROMORPHONE HCL 1 MG/ML IJ SOLN
0.5000 mg | Freq: Once | INTRAMUSCULAR | Status: AC
  Administered 2021-11-27: 0.5 mg via INTRAVENOUS
  Filled 2021-11-27: qty 0.5

## 2021-11-27 MED ORDER — HYDROCODONE-ACETAMINOPHEN 5-325 MG PO TABS
1.0000 | ORAL_TABLET | ORAL | Status: DC | PRN
  Administered 2021-11-28: 1 via ORAL
  Administered 2021-11-29: 2 via ORAL
  Administered 2021-11-29: 1 via ORAL
  Administered 2021-11-29: 2 via ORAL
  Administered 2021-11-30: 1 via ORAL
  Administered 2021-11-30 – 2021-12-02 (×5): 2 via ORAL
  Filled 2021-11-27 (×4): qty 2
  Filled 2021-11-27: qty 1
  Filled 2021-11-27: qty 2
  Filled 2021-11-27: qty 1
  Filled 2021-11-27: qty 2
  Filled 2021-11-27: qty 1
  Filled 2021-11-27: qty 2

## 2021-11-27 NOTE — Progress Notes (Signed)
Orthopedic Tech Progress Note Patient Details:  Mary [name] [birth year] [id number]  Applied traction with help of RN   Musculoskeletal Traction Type of Traction: Bucks Skin Traction Traction Location: LLE Traction Weight: 10 lbs   Post Interventions Patient Tolerated: Well Instructions Provided: Care of device  Donald Pore 11/27/2021, 11:55 AM

## 2021-11-27 NOTE — Plan of Care (Signed)
  Problem: Health Behavior/Discharge Planning: Goal: Ability to manage health-related needs will improve Outcome: Progressing   Problem: Clinical Measurements: Goal: Ability to maintain clinical measurements within normal limits will improve Outcome: Progressing   Problem: Clinical Measurements: Goal: Will remain free from infection Outcome: Progressing   Problem: Clinical Measurements: Goal: Respiratory complications will improve Outcome: Progressing   Problem: Activity: Goal: Risk for activity intolerance will decrease Outcome: Progressing   Problem: Nutrition: Goal: Adequate nutrition will be maintained Outcome: Progressing

## 2021-11-27 NOTE — Consult Note (Signed)
ORTHOPAEDIC CONSULTATION  REQUESTING PHYSICIAN: Geradine Girt, DO  Chief Complaint: left hip pain  HPI: Mary [name] is a 76 y.o. female who complains of left hip pain after falling at home yesterday. She has been recovering from a left THA anterior approach by Dr. Percell Miller on 11/22/21 that was without any major complications. She was able to [facility] home afterwards. No issues or complaints until the fall yesterday unfortunately. Her husband has been at home helping her recover and he went out to run an errand and while he was gone she tried to get up and use the bathroom on her own during which she fell. She had immediate pain in both hips and was unable to stand.   Imaging shows a left hip comminuted nondisplaced fracture in the region of the greater trochanter. There is also an oblique displaced fracture extending from the level of the greater trochanter to proximally 2 cm distal to the tip of the femoral prosthesis. This fracture is displaced distal to the level of the prosthesis with apex lateral angulation and 4.5 cm of overlap. There is no dislocation.    Orthopedics was consulted for evaluation.    No history of MI, CVA, DVT, PE.  Previously ambulatory using a walker while in the post-op period.  The patient is living at home with her husband.    Past Medical History:  Diagnosis Date   Allergic rhinitis    using Nasocort daily and Allegra daily   Anemia    Arthritis    "hands" (08/01/2013)   Bronchiectasis    uses Albuterol daily as needed but has been a while since used(more than a month ago),   Chronic cough    COPD (chronic obstructive pulmonary disease) ([place])    Diverticulosis    Dyspnea    with exertion   GERD (gastroesophageal reflux disease)    takes Prilosec daily   History of bladder infections    takes Keflex daily;Dr.Ottin is Urologist   History of MRSA infection    several yrs ago   Joint pain    Mycobacterium avium complex ([place])    Osteoporosis     Osteoporosis    takes Fosamax weekly   Pneumonia 12/02/1988   Zenker's diverticulum    Past Surgical History:  Procedure Laterality Date   ANTERIOR CERVICAL DECOMP/DISCECTOMY FUSION  ~ 1992   BREAST BIOPSY Left    COLONOSCOPY     COLONOSCOPY N/A 09/16/2015   Procedure: COLONOSCOPY;  Surgeon: Daneil Dolin, MD;  Location: [facility];  Service: Endoscopy;  Laterality: N/A;  11:00 Am   ESOPHAGOGASTRODUODENOSCOPY     TOTAL HIP ARTHROPLASTY Left 11/22/2021   Procedure: TOTAL HIP ARTHROPLASTY ANTERIOR APPROACH;  Surgeon: Renette Butters, MD;  Location: [facility];  Service: Orthopedics;  Laterality: Left;   VIDEO BRONCHOSCOPY Bilateral 10/13/2016   Procedure: VIDEO BRONCHOSCOPY WITHOUT FLUORO;  Surgeon: Collene Gobble, MD;  Location: [facility];  Service: Cardiopulmonary;  Laterality: Bilateral;   ZENKER'S DIVERTICULECTOMY ENDOSCOPIC  08/01/2013   Social History   Socioeconomic History   Marital status: Married    Spouse name: Not on file   Number of children: 1   Years of education: Not on file   Highest education level: Not on file  Occupational History   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  Tobacco Use   Smoking status: Never   Smokeless tobacco: Never  Vaping Use   Vaping Use: Never used  Substance and Sexual Activity  Alcohol use: Yes    Comment: Occasional wine with meal   Drug use: No   Sexual activity: Not Currently  Other Topics Concern   Not on file  Social History Narrative   Right Handed   Lives in one story home    Drinks caffeine    Social Determinants of Health   Financial Resource Strain: Not on file  Food Insecurity: Not on file  Transportation Needs: Not on file  Physical Activity: Not on file  Stress: Not on file  Social Connections: Not on file   Family History  Problem Relation Age of Onset   Allergies Mother    Heart disease Mother    Heart disease Father    Stroke Father    Allergies  Allergen Reactions   Levaquin  [Levofloxacin] Other (See Comments)    Hip pain   Rifadin [Rifampin] Other (See Comments)    Flu-like symptoms   Bactrim [Sulfamethoxazole-Trimethoprim] Rash   Vibramycin [Doxycycline] Rash    Splotches on skin   Prior to Admission medications   Medication Sig Start Date End Date Taking? Authorizing Provider  acetaminophen (TYLENOL) 500 MG tablet Take 2 tablets (1,000 mg total) by mouth every 8 (eight) hours as needed for mild pain or moderate pain. 11/22/21   Britt Bottom, PA-C  amoxicillin-clavulanate (AUGMENTIN) 875-125 MG tablet Take 1 tablet by mouth as directed. Take 1 tablet by mouth twice daily for 7 days (one week out of each month every other month--rotating with biaxin antibiotic every other month)    [provider]  aspirin EC 81 MG tablet Take 1 tablet (81 mg total) by mouth 2 (two) times daily. To prevent blood clots for 30 days after surgery. 11/22/21   Britt Bottom, PA-C  CALCIUM-VITAMIN D-VITAMIN K PO Take 1 tablet by mouth in the morning.    [provider]  clarithromycin (BIAXIN) 500 MG tablet Take 250 mg by mouth See admin instructions. Take 0.5 tablet (250 mg) by mouth twice daily for 7 days (one week out of each month every other month--rotating with augmentin antibiotic every other month)    [provider]  D-Mannose POWD Take 2 g by mouth at bedtime.    [provider]  fexofenadine (ALLEGRA) 180 MG tablet Take 180 mg by mouth in the morning.    [provider]  fluticasone (FLONASE) 50 MCG/ACT nasal spray Place 1 spray into both nostrils in the morning.    [provider]  gabapentin (NEURONTIN) 100 MG capsule Take two capsules at bedtime 11/14/21   Narda Amber K, DO  HYDROcodone-acetaminophen (NORCO) 10-325 MG tablet Take 1-2 tablets by mouth every 6 (six) hours as needed for severe pain. 11/22/21   Britt Bottom, PA-C  meloxicam (MOBIC) 15 MG tablet Take 1 tablet (15 mg total) by mouth daily as needed for  pain (and inflammation). 11/22/21   Britt Bottom, PA-C  methocarbamol (ROBAXIN-750) 750 MG tablet Take 1 tablet (750 mg total) by mouth every 8 (eight) hours as needed for muscle spasms. 11/22/21   Britt Bottom, PA-C  Multiple Vitamin (MULTIVITAMIN WITH MINERALS) TABS tablet Take 1 tablet by mouth in the morning.    [provider]  ondansetron (ZOFRAN-ODT) 4 MG disintegrating tablet Take 1 tablet (4 mg total) by mouth 2 (two) times daily as needed for nausea or vomiting. 11/22/21   Aggie Moats M, PA-C  Polyethyl Glycol-Propyl Glycol (LUBRICANT EYE DROPS) 0.4-0.3 % SOLN Place 1 drop into both eyes  3 (three) times daily as needed (dry/irritated eyes.).    [provider]  sodium chloride (OCEAN) 0.65 % SOLN nasal spray Place 1 spray into both nostrils at bedtime.    [provider]  sodium chloride, hypertonic, 3 % solution INHALE 3ML VIA NEBULIZER TWICE DAILY Patient taking differently: 3 mLs daily. 07/04/21   Collene Gobble, MD  VENTOLIN HFA 108 (90 Base) MCG/ACT inhaler inhale TWO puffs into THE lungs EVERY 6 HOURS AS NEEDED FOR wheezing OR SHORTNESS OF BREATH 11/21/21   Cook, [place] G, DO  Wheat Dextrin (BENEFIBER) POWD Take 1 Dose by mouth in the morning.    [provider]   DG Hip Unilat W or Wo Pelvis 2-3 Views Left  Result Date: 11/26/2021 CLINICAL DATA:  Left hip replacement four days ago, fell today with increasing pain. EXAM: DG HIP (WITH OR WITHOUT PELVIS) 2-3V LEFT; LEFT FEMUR 2 VIEWS COMPARISON:  CT left femur earlier today. FINDINGS: There is osteopenia.  Left hip arthroplasty again noted. No hardware dislocation. Comminuted proximal left femoral fracture again is seen. This consists of a longitudinal fracture through the greater trochanter alongside the hardware, with the fragment displaced 6 mm laterally and slightly cephalad displaced, and an oblique proximal shaft fracture alongside the distal femoral hardware stem. The main distal fracture  fragment is angled medially, laterally displaced by about 1 cortex width and with dorsal overriding of up to 7.5 cm on some images. There is a linear track of soft tissue air at the fat muscle interface in the upper thigh which is probably residual from the hip replacement surgery. Moderately advanced right hip DJD is incidentally noted, and healed fracture deformities of the left pubic rami. IMPRESSION: Perihardware proximal left femoral fractures with distal fragment dorsal overriding and medial angulation, with the greater trochanter fracture only slightly displaced. Osteopenia and degenerative change. No dislocation. Electronically Signed   By: Telford Nab M.D.   On: 11/26/2021 20:19   DG Femur Min 2 Views Left  Result Date: 11/26/2021 CLINICAL DATA:  Left hip replacement four days ago, fell today with increasing pain. EXAM: DG HIP (WITH OR WITHOUT PELVIS) 2-3V LEFT; LEFT FEMUR 2 VIEWS COMPARISON:  CT left femur earlier today. FINDINGS: There is osteopenia.  Left hip arthroplasty again noted. No hardware dislocation. Comminuted proximal left femoral fracture again is seen. This consists of a longitudinal fracture through the greater trochanter alongside the hardware, with the fragment displaced 6 mm laterally and slightly cephalad displaced, and an oblique proximal shaft fracture alongside the distal femoral hardware stem. The main distal fracture fragment is angled medially, laterally displaced by about 1 cortex width and with dorsal overriding of up to 7.5 cm on some images. There is a linear track of soft tissue air at the fat muscle interface in the upper thigh which is probably residual from the hip replacement surgery. Moderately advanced right hip DJD is incidentally noted, and healed fracture deformities of the left pubic rami. IMPRESSION: Perihardware proximal left femoral fractures with distal fragment dorsal overriding and medial angulation, with the greater trochanter fracture only slightly  displaced. Osteopenia and degenerative change. No dislocation. Electronically Signed   By: Telford Nab M.D.   On: 11/26/2021 20:19   CT FEMUR LEFT WO CONTRAST  Result Date: 11/26/2021 CLINICAL DATA:  Hip trauma. EXAM: CT OF THE LOWER LEFT EXTREMITY WITHOUT CONTRAST TECHNIQUE: Multidetector CT imaging of the lower left extremity was performed according to the standard protocol. RADIATION DOSE REDUCTION: This exam was performed  according to the departmental dose-optimization program which includes automated exposure control, adjustment of the mA and/or kV according to patient size and/or use of iterative reconstruction technique. COMPARISON:  None Available. FINDINGS: Bones/Joint/Cartilage There is a left hip arthroplasty present in anatomic alignment. There is a comminuted nondisplaced fracture in the region of the greater trochanter. There is oblique displaced fracture extending from the level of the greater trochanter two proximally 2 cm distal to the tip of the femoral prosthesis. This fracture is displaced distal to the level of the prosthesis with apex lateral angulation and 4.5 cm of overlap. There is no dislocation. Ligaments Suboptimally assessed by CT. Muscles and Tendons There is intramuscular edema surrounding the fracture sites. There is a small amount of intramuscular air anterior to the hip joint. Soft tissues There is subcutaneous edema of the anterior thigh. Bladder is markedly distended. IMPRESSION: 1. Left hip arthroplasty appears in anatomic alignment. 2. Acute comminuted fracture of the mid and proximal femur from the level of the greater trochanter to the level of the mid diaphysis. 2 cm distal to the femoral component of the arthroplasty there is a displaced angulated fracture with overlap. Electronically Signed   By: Ronney Asters M.D.   On: 11/26/2021 18:38   CT Hip Right Wo Contrast  Result Date: 11/26/2021 CLINICAL DATA:  Fall, right hip pain EXAM: CT OF THE RIGHT HIP WITHOUT  CONTRAST TECHNIQUE: Multidetector CT imaging of the right hip was performed according to the standard protocol. Multiplanar CT image reconstructions were also generated. RADIATION DOSE REDUCTION: This exam was performed according to the departmental dose-optimization program which includes automated exposure control, adjustment of the mA and/or kV according to patient size and/or use of iterative reconstruction technique. COMPARISON:  Pelvic radiograph dated 11/26/2021 FINDINGS: No fracture or dislocation is seen. Moderate degenerative changes of the right hip. Visualized bony pelvis appears intact. Visualized soft tissue pelvis is unremarkable, noting bladder distension and colonic diverticulosis. IMPRESSION: Moderate degenerative changes of the right hip. No fracture or dislocation is seen. Electronically Signed   By: Julian Hy M.D.   On: 11/26/2021 18:31   DG Pelvis 1-2 Views  Result Date: 11/26/2021 CLINICAL DATA:  Status post left hip arthroplasty. EXAM: PELVIS - 1 VIEW COMPARISON:  None Available. FINDINGS: Exam is technically suboptimal due to patient's inability to cooperate with positioning. A bipolar left hip prosthesis is seen in expected position. Old fracture deformity of left superior pubic ramus noted. Severe right hip osteoarthritis also seen. IMPRESSION: Technically suboptimal exam. Bipolar left hip prosthesis in expected position. Severe right hip osteoarthritis. Electronically Signed   By: Marlaine Hind M.D.   On: 11/26/2021 15:49    Positive ROS: All other systems have been reviewed and were otherwise negative with the exception of those mentioned in the HPI and as above.  Objective: Labs cbc Recent Labs    11/26/21 1817  WBC 7.1  HGB 10.8*  HCT 32.4*  PLT 226    Labs inflam No results for input(s): "CRP" in the last 72 hours.  Invalid input(s): "ESR"  Labs coag No results for input(s): "INR", "PTT" in the last 72 hours.  Invalid input(s): "PT"  Recent Labs     11/26/21 1817  NA 131*  K 3.9  CL 103  CO2 20*  GLUCOSE 135*  BUN 18  CREATININE 0.46  CALCIUM 7.9*    Physical Exam: Vitals:   11/27/21 0117 11/27/21 0543  BP: (!) 167/70 130/65  Pulse: (!) 106 97  Resp: 17  18  Temp: 97.9 F (36.6 C) 98 F (36.7 C)  SpO2: 99% 98%   General: Alert, no acute distress.  Laying in bed, calm, obvious discomfort Mental status: Alert and Oriented x3 Neurologic: Speech Clear and organized, no gross focal findings or movement disorder appreciated. Respiratory: No cyanosis, no use of accessory musculature Cardiovascular: No pedal edema GI: Abdomen is soft and non-tender, non-distended. Skin: Warm and dry Extremities: Warm and well perfused w/o edema Psychiatric: Patient is competent for consent with normal mood and affect  MUSCULOSKELETAL:  TTP left hip and thigh, limited hip ROM, edema and ecchymosis present, surgical bandage in place over anterior approach incision, NVI Other extremities are atraumatic with painless ROM and NVI.  Assessment / Plan: Principal Problem:   Closed left femoral fracture (HCC) Active Problems:   Bronchiectasis (HCC)   S/P total left hip arthroplasty   Fall at home, initial encounter   Hyponatremia   Unfortunately we will have to return to the OR to fix the periprosthetic fractures surrounding the stem. The acetabulum hardware looks stable. We have discussed the case with Dr. Zachery Dakins who handles complex revision joint cases and he has agreed to perform the surgery on Monday 11/28/21. She will be NPO at midnight.    Weightbearing: NWB LLE Insicional and dressing care: Reinforce dressings as needed Orthopedic device(s): None Showering: hold for now VTE prophylaxis:  has been on ASA 79m bid post-op, currently on hold for surgery Monday   Pain control: Tylenol, Dilaudid PRN. Will add Tramadol, Norco, and Oxy Follow - up plan:  TBD Contact information:  TEdmonia LynchMD, MMile High Surgicenter LLCPA-C    MBritt BottomPA-C Office [phone]/27/2023 7:31 AM

## 2021-11-27 NOTE — Progress Notes (Signed)
Bucks traction placed with ortho tech.

## 2021-11-27 NOTE — ED Notes (Signed)
+   left pedal pulse 

## 2021-11-27 NOTE — ED Notes (Signed)
+   left pedal pulse

## 2021-11-27 NOTE — ED Notes (Signed)
+   pedal pulse noted in Left foot

## 2021-11-27 NOTE — ED Notes (Signed)
+   pedal pulse left foot

## 2021-11-27 NOTE — Progress Notes (Signed)
PROGRESS NOTE    Mary [name]  OIZ:[id number] DOB: 1945/09/13 DOA: 11/26/2021 PCP: [name], DO    Brief Narrative:   Mary [name] is a 76 y.o. 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  Patient had left hip replacement 4 days ago and has been doing well with physical therapy and has been able to ambulate with a walker since the last 2 days.  Patient needed to use the restroom (unfortunately, husband already stepped out on an errand), on returning from the restroom, while trying to sit down, she accidentally fell with her right hip on the walker, thereby sustaining pain rated as 10/10 on pain scale.   Found to have a fracture.  Assessment and Plan: Left femoral fracture CT of left lower extremity showed acute comminuted fracture of the mid and proximal femur from the level of the greater trochanter to the level of the mid diaphysis She was treated with fentanyl, Dilaudid, morphine  -We have discussed the case with Dr. Blanchie Dessert who handles complex revision joint cases and he has agreed to perform the surgery on Monday 11/28/21.  Fall at home Continue fall precaution and neuro checks   Hyponatremia -recheck in AM   Bronchiectasis -pulmonology toilet    DVT prophylaxis: SCDs Start: 11/26/21 2115    Code Status: Full Code Family Communication:   Disposition Plan:  Level of care: Med-Surg Status is: Inpatient Remains inpatient appropriate because: needs surgery    Consultants:  ortho   Subjective: Getting buck's traction  Objective: Vitals:   11/27/21 0117 11/27/21 0120 11/27/21 0543 11/27/21 0948  BP: (!) 167/70  130/65 (!) 126/51  Pulse: (!) 106  97 98  Resp: 17  18 18   Temp: 97.9 F (36.6 C)  98 F  (36.7 C) 97.8 F (36.6 C)  TempSrc: Oral  Oral Oral  SpO2: 99%  98% 93%  Weight:  50.5 kg    Height:  5\' 4"  (1.626 m)      Intake/Output Summary (Last 24 hours) at 11/27/2021 1229 Last data filed at 11/27/2021 0700 Gross per 24 hour  Intake 279.94 ml  Output 100 ml  Net 179.94 ml   Filed Weights   11/26/21 1411 11/27/21 0120  Weight: 47.2 kg 50.5 kg    Examination:   General: Appearance:    Thin female in no acute distress     Lungs:     respirations unlabored  Heart:    Normal heart rate.    MS:   All extremities are intact.    Neurologic:   Awake, alert       Data Reviewed: I have personally reviewed following labs and imaging studies  CBC: Recent Labs  Lab 11/26/21 1817  WBC 7.1  NEUTROABS 5.6  HGB 10.8*  HCT 32.4*  MCV 92.8  PLT 226   Basic Metabolic Panel: Recent Labs  Lab 11/26/21 1817  NA 131*  K 3.9  CL 103  CO2 20*  GLUCOSE 135*  BUN 18  CREATININE 0.46  CALCIUM 7.9*   GFR: Estimated Creatinine Clearance: 47.7 mL/min (by C-G formula based on SCr of 0.46 mg/dL). Liver Function Tests: No results for input(s): "AST", "ALT", "ALKPHOS", "BILITOT", "PROT", "ALBUMIN" in the last 168 hours. No results for input(s): "LIPASE", "  AMYLASE" in the last 168 hours. No results for input(s): "AMMONIA" in the last 168 hours. Coagulation Profile: No results for input(s): "INR", "PROTIME" in the last 168 hours. Cardiac Enzymes: No results for input(s): "CKTOTAL", "CKMB", "CKMBINDEX", "TROPONINI" in the last 168 hours. BNP (last 3 results) No results for input(s): "PROBNP" in the last 8760 hours. HbA1C: No results for input(s): "HGBA1C" in the last 72 hours. CBG: No results for input(s): "GLUCAP" in the last 168 hours. Lipid Profile: No results for input(s): "CHOL", "HDL", "LDLCALC", "TRIG", "CHOLHDL", "LDLDIRECT" in the last 72 hours. Thyroid Function Tests: No results for input(s): "TSH", "T4TOTAL", "FREET4", "T3FREE", "THYROIDAB" in the last 72  hours. Anemia Panel: No results for input(s): "VITAMINB12", "FOLATE", "FERRITIN", "TIBC", "IRON", "RETICCTPCT" in the last 72 hours. Sepsis Labs: No results for input(s): "PROCALCITON", "LATICACIDVEN" in the last 168 hours.  No results found for this or any previous visit (from the past 240 hour(s)).       Radiology Studies: DG Hip Unilat W or Wo Pelvis 2-3 Views Left  Result Date: 11/26/2021 CLINICAL DATA:  Left hip replacement four days ago, fell today with increasing pain. EXAM: DG HIP (WITH OR WITHOUT PELVIS) 2-3V LEFT; LEFT FEMUR 2 VIEWS COMPARISON:  CT left femur earlier today. FINDINGS: There is osteopenia.  Left hip arthroplasty again noted. No hardware dislocation. Comminuted proximal left femoral fracture again is seen. This consists of a longitudinal fracture through the greater trochanter alongside the hardware, with the fragment displaced 6 mm laterally and slightly cephalad displaced, and an oblique proximal shaft fracture alongside the distal femoral hardware stem. The main distal fracture fragment is angled medially, laterally displaced by about 1 cortex width and with dorsal overriding of up to 7.5 cm on some images. There is a linear track of soft tissue air at the fat muscle interface in the upper thigh which is probably residual from the hip replacement surgery. Moderately advanced right hip DJD is incidentally noted, and healed fracture deformities of the left pubic rami. IMPRESSION: Perihardware proximal left femoral fractures with distal fragment dorsal overriding and medial angulation, with the greater trochanter fracture only slightly displaced. Osteopenia and degenerative change. No dislocation. Electronically Signed   By: Almira Bar M.D.   On: 11/26/2021 20:19   DG Femur Min 2 Views Left  Result Date: 11/26/2021 CLINICAL DATA:  Left hip replacement four days ago, fell today with increasing pain. EXAM: DG HIP (WITH OR WITHOUT PELVIS) 2-3V LEFT; LEFT FEMUR 2 VIEWS  COMPARISON:  CT left femur earlier today. FINDINGS: There is osteopenia.  Left hip arthroplasty again noted. No hardware dislocation. Comminuted proximal left femoral fracture again is seen. This consists of a longitudinal fracture through the greater trochanter alongside the hardware, with the fragment displaced 6 mm laterally and slightly cephalad displaced, and an oblique proximal shaft fracture alongside the distal femoral hardware stem. The main distal fracture fragment is angled medially, laterally displaced by about 1 cortex width and with dorsal overriding of up to 7.5 cm on some images. There is a linear track of soft tissue air at the fat muscle interface in the upper thigh which is probably residual from the hip replacement surgery. Moderately advanced right hip DJD is incidentally noted, and healed fracture deformities of the left pubic rami. IMPRESSION: Perihardware proximal left femoral fractures with distal fragment dorsal overriding and medial angulation, with the greater trochanter fracture only slightly displaced. Osteopenia and degenerative change. No dislocation. Electronically Signed   By: Earlean Shawl.D.  On: 11/26/2021 20:19   CT FEMUR LEFT WO CONTRAST  Result Date: 11/26/2021 CLINICAL DATA:  Hip trauma. EXAM: CT OF THE LOWER LEFT EXTREMITY WITHOUT CONTRAST TECHNIQUE: Multidetector CT imaging of the lower left extremity was performed according to the standard protocol. RADIATION DOSE REDUCTION: This exam was performed according to the departmental dose-optimization program which includes automated exposure control, adjustment of the mA and/or kV according to patient size and/or use of iterative reconstruction technique. COMPARISON:  None Available. FINDINGS: Bones/Joint/Cartilage There is a left hip arthroplasty present in anatomic alignment. There is a comminuted nondisplaced fracture in the region of the greater trochanter. There is oblique displaced fracture extending from the  level of the greater trochanter two proximally 2 cm distal to the tip of the femoral prosthesis. This fracture is displaced distal to the level of the prosthesis with apex lateral angulation and 4.5 cm of overlap. There is no dislocation. Ligaments Suboptimally assessed by CT. Muscles and Tendons There is intramuscular edema surrounding the fracture sites. There is a small amount of intramuscular air anterior to the hip joint. Soft tissues There is subcutaneous edema of the anterior thigh. Bladder is markedly distended. IMPRESSION: 1. Left hip arthroplasty appears in anatomic alignment. 2. Acute comminuted fracture of the mid and proximal femur from the level of the greater trochanter to the level of the mid diaphysis. 2 cm distal to the femoral component of the arthroplasty there is a displaced angulated fracture with overlap. Electronically Signed   By: Darliss Cheney M.D.   On: 11/26/2021 18:38   CT Hip Right Wo Contrast  Result Date: 11/26/2021 CLINICAL DATA:  Fall, right hip pain EXAM: CT OF THE RIGHT HIP WITHOUT CONTRAST TECHNIQUE: Multidetector CT imaging of the right hip was performed according to the standard protocol. Multiplanar CT image reconstructions were also generated. RADIATION DOSE REDUCTION: This exam was performed according to the departmental dose-optimization program which includes automated exposure control, adjustment of the mA and/or kV according to patient size and/or use of iterative reconstruction technique. COMPARISON:  Pelvic radiograph dated 11/26/2021 FINDINGS: No fracture or dislocation is seen. Moderate degenerative changes of the right hip. Visualized bony pelvis appears intact. Visualized soft tissue pelvis is unremarkable, noting bladder distension and colonic diverticulosis. IMPRESSION: Moderate degenerative changes of the right hip. No fracture or dislocation is seen. Electronically Signed   By: Charline Bills M.D.   On: 11/26/2021 18:31   DG Pelvis 1-2 Views  Result  Date: 11/26/2021 CLINICAL DATA:  Status post left hip arthroplasty. EXAM: PELVIS - 1 VIEW COMPARISON:  None Available. FINDINGS: Exam is technically suboptimal due to patient's inability to cooperate with positioning. A bipolar left hip prosthesis is seen in expected position. Old fracture deformity of left superior pubic ramus noted. Severe right hip osteoarthritis also seen. IMPRESSION: Technically suboptimal exam. Bipolar left hip prosthesis in expected position. Severe right hip osteoarthritis. Electronically Signed   By: Danae Orleans M.D.   On: 11/26/2021 15:49        Scheduled Meds:  acetaminophen  500 mg Oral Q8H   pantoprazole (PROTONIX) IV  40 mg Intravenous Q24H   traMADol  50 mg Oral Q6H   Continuous Infusions:  sodium chloride 50 mL/hr at 11/27/21 0700   methocarbamol (ROBAXIN) IV       LOS: 1 day    Time spent: 45 minutes spent on chart review, discussion with nursing staff, consultants, updating family and interview/physical exam; more than 50% of that time was spent in counseling and/or coordination  of care.    Joseph Art, DO [facility] Available via Epic secure chat 7am-7pm After these hours, please refer to coverage provider listed on [email or web address] 11/27/2021, 12:29 PM

## 2021-11-27 NOTE — H&P (View-Only) (Signed)
ORTHOPAEDIC CONSULTATION  REQUESTING PHYSICIAN: Geradine Girt, DO  Chief Complaint: left hip pain  HPI: Mary [name] is a 76 y.o. female who complains of left hip pain after falling at home yesterday. She has been recovering from a left THA anterior approach by Dr. Percell Miller on 11/22/21 that was without any major complications. She was able to [facility] home afterwards. No issues or complaints until the fall yesterday unfortunately. Her husband has been at home helping her recover and he went out to run an errand and while he was gone she tried to get up and use the bathroom on her own during which she fell. She had immediate pain in both hips and was unable to stand.   Imaging shows a left hip comminuted nondisplaced fracture in the region of the greater trochanter. There is also an oblique displaced fracture extending from the level of the greater trochanter to proximally 2 cm distal to the tip of the femoral prosthesis. This fracture is displaced distal to the level of the prosthesis with apex lateral angulation and 4.5 cm of overlap. There is no dislocation.    Orthopedics was consulted for evaluation.    No history of MI, CVA, DVT, PE.  Previously ambulatory using a walker while in the post-op period.  The patient is living at home with her husband.    Past Medical History:  Diagnosis Date   Allergic rhinitis    using Nasocort daily and Allegra daily   Anemia    Arthritis    "hands" (08/01/2013)   Bronchiectasis    uses Albuterol daily as needed but has been a while since used(more than a month ago),   Chronic cough    COPD (chronic obstructive pulmonary disease) ([place])    Diverticulosis    Dyspnea    with exertion   GERD (gastroesophageal reflux disease)    takes Prilosec daily   History of bladder infections    takes Keflex daily;Dr.Ottin is Urologist   History of MRSA infection    several yrs ago   Joint pain    Mycobacterium avium complex ([place])    Osteoporosis     Osteoporosis    takes Fosamax weekly   Pneumonia 12/02/1988   Zenker's diverticulum    Past Surgical History:  Procedure Laterality Date   ANTERIOR CERVICAL DECOMP/DISCECTOMY FUSION  ~ 1992   BREAST BIOPSY Left    COLONOSCOPY     COLONOSCOPY N/A 09/16/2015   Procedure: COLONOSCOPY;  Surgeon: Daneil Dolin, MD;  Location: [facility];  Service: Endoscopy;  Laterality: N/A;  11:00 Am   ESOPHAGOGASTRODUODENOSCOPY     TOTAL HIP ARTHROPLASTY Left 11/22/2021   Procedure: TOTAL HIP ARTHROPLASTY ANTERIOR APPROACH;  Surgeon: Renette Butters, MD;  Location: [facility];  Service: Orthopedics;  Laterality: Left;   VIDEO BRONCHOSCOPY Bilateral 10/13/2016   Procedure: VIDEO BRONCHOSCOPY WITHOUT FLUORO;  Surgeon: Collene Gobble, MD;  Location: [facility];  Service: Cardiopulmonary;  Laterality: Bilateral;   ZENKER'S DIVERTICULECTOMY ENDOSCOPIC  08/01/2013   Social History   Socioeconomic History   Marital status: Married    Spouse name: Not on file   Number of children: 1   Years of education: Not on file   Highest education level: Not on file  Occupational History   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  Tobacco Use   Smoking status: Never   Smokeless tobacco: Never  Vaping Use   Vaping Use: Never used  Substance and Sexual Activity  Alcohol use: Yes    Comment: Occasional wine with meal   Drug use: No   Sexual activity: Not Currently  Other Topics Concern   Not on file  Social History Narrative   Right Handed   Lives in one story home    Drinks caffeine    Social Determinants of Health   Financial Resource Strain: Not on file  Food Insecurity: Not on file  Transportation Needs: Not on file  Physical Activity: Not on file  Stress: Not on file  Social Connections: Not on file   Family History  Problem Relation Age of Onset   Allergies Mother    Heart disease Mother    Heart disease Father    Stroke Father    Allergies  Allergen Reactions   Levaquin  [Levofloxacin] Other (See Comments)    Hip pain   Rifadin [Rifampin] Other (See Comments)    Flu-like symptoms   Bactrim [Sulfamethoxazole-Trimethoprim] Rash   Vibramycin [Doxycycline] Rash    Splotches on skin   Prior to Admission medications   Medication Sig Start Date End Date Taking? Authorizing Provider  acetaminophen (TYLENOL) 500 MG tablet Take 2 tablets (1,000 mg total) by mouth every 8 (eight) hours as needed for mild pain or moderate pain. 11/22/21   Franko Hilliker M, PA-C  amoxicillin-clavulanate (AUGMENTIN) 875-125 MG tablet Take 1 tablet by mouth as directed. Take 1 tablet by mouth twice daily for 7 days (one week out of each month every other month--rotating with biaxin antibiotic every other month)    [provider]  aspirin EC 81 MG tablet Take 1 tablet (81 mg total) by mouth 2 (two) times daily. To prevent blood clots for 30 days after surgery. 11/22/21   Callan Norden M, PA-C  CALCIUM-VITAMIN D-VITAMIN K PO Take 1 tablet by mouth in the morning.    [provider]  clarithromycin (BIAXIN) 500 MG tablet Take 250 mg by mouth See admin instructions. Take 0.5 tablet (250 mg) by mouth twice daily for 7 days (one week out of each month every other month--rotating with augmentin antibiotic every other month)    [provider]  D-Mannose POWD Take 2 g by mouth at bedtime.    [provider]  fexofenadine (ALLEGRA) 180 MG tablet Take 180 mg by mouth in the morning.    [provider]  fluticasone (FLONASE) 50 MCG/ACT nasal spray Place 1 spray into both nostrils in the morning.    [provider]  gabapentin (NEURONTIN) 100 MG capsule Take two capsules at bedtime 11/14/21   Patel, Donika K, DO  HYDROcodone-acetaminophen (NORCO) 10-325 MG tablet Take 1-2 tablets by mouth every 6 (six) hours as needed for severe pain. 11/22/21   Markayla Reichart M, PA-C  meloxicam (MOBIC) 15 MG tablet Take 1 tablet (15 mg total) by mouth daily as needed for  pain (and inflammation). 11/22/21   Janice Bodine M, PA-C  methocarbamol (ROBAXIN-750) 750 MG tablet Take 1 tablet (750 mg total) by mouth every 8 (eight) hours as needed for muscle spasms. 11/22/21   Koriana Stepien M, PA-C  Multiple Vitamin (MULTIVITAMIN WITH MINERALS) TABS tablet Take 1 tablet by mouth in the morning.    [provider]  ondansetron (ZOFRAN-ODT) 4 MG disintegrating tablet Take 1 tablet (4 mg total) by mouth 2 (two) times daily as needed for nausea or vomiting. 11/22/21   Hero Mccathern M, PA-C  Polyethyl Glycol-Propyl Glycol (LUBRICANT EYE DROPS) 0.4-0.3 % SOLN Place 1 drop into both eyes   3 (three) times daily as needed (dry/irritated eyes.).    [provider]  sodium chloride (OCEAN) 0.65 % SOLN nasal spray Place 1 spray into both nostrils at bedtime.    [provider]  sodium chloride, hypertonic, 3 % solution INHALE 3ML VIA NEBULIZER TWICE DAILY Patient taking differently: 3 mLs daily. 07/04/21   Byrum, Robert S, MD  VENTOLIN HFA 108 (90 Base) MCG/ACT inhaler inhale TWO puffs into THE lungs EVERY 6 HOURS AS NEEDED FOR wheezing OR SHORTNESS OF BREATH 11/21/21   Cook, Jayce G, DO  Wheat Dextrin (BENEFIBER) POWD Take 1 Dose by mouth in the morning.    [provider]   DG Hip Unilat W or Wo Pelvis 2-3 Views Left  Result Date: 11/26/2021 CLINICAL DATA:  Left hip replacement four days ago, fell today with increasing pain. EXAM: DG HIP (WITH OR WITHOUT PELVIS) 2-3V LEFT; LEFT FEMUR 2 VIEWS COMPARISON:  CT left femur earlier today. FINDINGS: There is osteopenia.  Left hip arthroplasty again noted. No hardware dislocation. Comminuted proximal left femoral fracture again is seen. This consists of a longitudinal fracture through the greater trochanter alongside the hardware, with the fragment displaced 6 mm laterally and slightly cephalad displaced, and an oblique proximal shaft fracture alongside the distal femoral hardware stem. The main distal fracture  fragment is angled medially, laterally displaced by about 1 cortex width and with dorsal overriding of up to 7.5 cm on some images. There is a linear track of soft tissue air at the fat muscle interface in the upper thigh which is probably residual from the hip replacement surgery. Moderately advanced right hip DJD is incidentally noted, and healed fracture deformities of the left pubic rami. IMPRESSION: Perihardware proximal left femoral fractures with distal fragment dorsal overriding and medial angulation, with the greater trochanter fracture only slightly displaced. Osteopenia and degenerative change. No dislocation. Electronically Signed   By: Keith  Chesser M.D.   On: 11/26/2021 20:19   DG Femur Min 2 Views Left  Result Date: 11/26/2021 CLINICAL DATA:  Left hip replacement four days ago, fell today with increasing pain. EXAM: DG HIP (WITH OR WITHOUT PELVIS) 2-3V LEFT; LEFT FEMUR 2 VIEWS COMPARISON:  CT left femur earlier today. FINDINGS: There is osteopenia.  Left hip arthroplasty again noted. No hardware dislocation. Comminuted proximal left femoral fracture again is seen. This consists of a longitudinal fracture through the greater trochanter alongside the hardware, with the fragment displaced 6 mm laterally and slightly cephalad displaced, and an oblique proximal shaft fracture alongside the distal femoral hardware stem. The main distal fracture fragment is angled medially, laterally displaced by about 1 cortex width and with dorsal overriding of up to 7.5 cm on some images. There is a linear track of soft tissue air at the fat muscle interface in the upper thigh which is probably residual from the hip replacement surgery. Moderately advanced right hip DJD is incidentally noted, and healed fracture deformities of the left pubic rami. IMPRESSION: Perihardware proximal left femoral fractures with distal fragment dorsal overriding and medial angulation, with the greater trochanter fracture only slightly  displaced. Osteopenia and degenerative change. No dislocation. Electronically Signed   By: Keith  Chesser M.D.   On: 11/26/2021 20:19   CT FEMUR LEFT WO CONTRAST  Result Date: 11/26/2021 CLINICAL DATA:  Hip trauma. EXAM: CT OF THE LOWER LEFT EXTREMITY WITHOUT CONTRAST TECHNIQUE: Multidetector CT imaging of the lower left extremity was performed according to the standard protocol. RADIATION DOSE REDUCTION: This exam was performed   according to the departmental dose-optimization program which includes automated exposure control, adjustment of the mA and/or kV according to patient size and/or use of iterative reconstruction technique. COMPARISON:  None Available. FINDINGS: Bones/Joint/Cartilage There is a left hip arthroplasty present in anatomic alignment. There is a comminuted nondisplaced fracture in the region of the greater trochanter. There is oblique displaced fracture extending from the level of the greater trochanter two proximally 2 cm distal to the tip of the femoral prosthesis. This fracture is displaced distal to the level of the prosthesis with apex lateral angulation and 4.5 cm of overlap. There is no dislocation. Ligaments Suboptimally assessed by CT. Muscles and Tendons There is intramuscular edema surrounding the fracture sites. There is a small amount of intramuscular air anterior to the hip joint. Soft tissues There is subcutaneous edema of the anterior thigh. Bladder is markedly distended. IMPRESSION: 1. Left hip arthroplasty appears in anatomic alignment. 2. Acute comminuted fracture of the mid and proximal femur from the level of the greater trochanter to the level of the mid diaphysis. 2 cm distal to the femoral component of the arthroplasty there is a displaced angulated fracture with overlap. Electronically Signed   By: Amy  Guttmann M.D.   On: 11/26/2021 18:38   CT Hip Right Wo Contrast  Result Date: 11/26/2021 CLINICAL DATA:  Fall, right hip pain EXAM: CT OF THE RIGHT HIP WITHOUT  CONTRAST TECHNIQUE: Multidetector CT imaging of the right hip was performed according to the standard protocol. Multiplanar CT image reconstructions were also generated. RADIATION DOSE REDUCTION: This exam was performed according to the departmental dose-optimization program which includes automated exposure control, adjustment of the mA and/or kV according to patient size and/or use of iterative reconstruction technique. COMPARISON:  Pelvic radiograph dated 11/26/2021 FINDINGS: No fracture or dislocation is seen. Moderate degenerative changes of the right hip. Visualized bony pelvis appears intact. Visualized soft tissue pelvis is unremarkable, noting bladder distension and colonic diverticulosis. IMPRESSION: Moderate degenerative changes of the right hip. No fracture or dislocation is seen. Electronically Signed   By: Sriyesh  Krishnan M.D.   On: 11/26/2021 18:31   DG Pelvis 1-2 Views  Result Date: 11/26/2021 CLINICAL DATA:  Status post left hip arthroplasty. EXAM: PELVIS - 1 VIEW COMPARISON:  None Available. FINDINGS: Exam is technically suboptimal due to patient's inability to cooperate with positioning. A bipolar left hip prosthesis is seen in expected position. Old fracture deformity of left superior pubic ramus noted. Severe right hip osteoarthritis also seen. IMPRESSION: Technically suboptimal exam. Bipolar left hip prosthesis in expected position. Severe right hip osteoarthritis. Electronically Signed   By: John A Stahl M.D.   On: 11/26/2021 15:49    Positive ROS: All other systems have been reviewed and were otherwise negative with the exception of those mentioned in the HPI and as above.  Objective: Labs cbc Recent Labs    11/26/21 1817  WBC 7.1  HGB 10.8*  HCT 32.4*  PLT 226    Labs inflam No results for input(s): "CRP" in the last 72 hours.  Invalid input(s): "ESR"  Labs coag No results for input(s): "INR", "PTT" in the last 72 hours.  Invalid input(s): "PT"  Recent Labs     11/26/21 1817  NA 131*  K 3.9  CL 103  CO2 20*  GLUCOSE 135*  BUN 18  CREATININE 0.46  CALCIUM 7.9*    Physical Exam: Vitals:   11/27/21 0117 11/27/21 0543  BP: (!) 167/70 130/65  Pulse: (!) 106 97  Resp: 17   18  Temp: 97.9 F (36.6 C) 98 F (36.7 C)  SpO2: 99% 98%   General: Alert, no acute distress.  Laying in bed, calm, obvious discomfort Mental status: Alert and Oriented x3 Neurologic: Speech Clear and organized, no gross focal findings or movement disorder appreciated. Respiratory: No cyanosis, no use of accessory musculature Cardiovascular: No pedal edema GI: Abdomen is soft and non-tender, non-distended. Skin: Warm and dry Extremities: Warm and well perfused w/o edema Psychiatric: Patient is competent for consent with normal mood and affect  MUSCULOSKELETAL:  TTP left hip and thigh, limited hip ROM, edema and ecchymosis present, surgical bandage in place over anterior approach incision, NVI Other extremities are atraumatic with painless ROM and NVI.  Assessment / Plan: Principal Problem:   Closed left femoral fracture (HCC) Active Problems:   Bronchiectasis (HCC)   S/P total left hip arthroplasty   Fall at home, initial encounter   Hyponatremia   Unfortunately we will have to return to the OR to fix the periprosthetic fractures surrounding the stem. The acetabulum hardware looks stable. We have discussed the case with Dr. Marchwiany who handles complex revision joint cases and he has agreed to perform the surgery on Monday 11/28/21. She will be NPO at midnight.    Weightbearing: NWB LLE Insicional and dressing care: Reinforce dressings as needed Orthopedic device(s): None Showering: hold for now VTE prophylaxis:  has been on ASA 81mg bid post-op, currently on hold for surgery Monday   Pain control: Tylenol, Dilaudid PRN. Will add Tramadol, Norco, and Oxy Follow - up plan:  TBD Contact information:  Timothy Murphy MD, Mimie Goering PA-C    Sadee Osland M  Lenus Trauger PA-C Office [phone] 11/27/2021 7:31 AM  

## 2021-11-27 NOTE — ED Notes (Signed)
[facility] on unit for transport  

## 2021-11-27 NOTE — Progress Notes (Signed)
PT refused QD Hypertonic (uses PRN at home, please consider ordering as such). PT also refused Q4 CPT- states she does that at home PRN (please consider ordering as such).

## 2021-11-28 ENCOUNTER — Inpatient Hospital Stay (HOSPITAL_COMMUNITY): Payer: Medicare Other

## 2021-11-28 ENCOUNTER — Inpatient Hospital Stay (HOSPITAL_COMMUNITY): Payer: Medicare Other | Admitting: Certified Registered Nurse Anesthetist

## 2021-11-28 ENCOUNTER — Encounter (HOSPITAL_COMMUNITY): Payer: Self-pay | Admitting: Internal Medicine

## 2021-11-28 ENCOUNTER — Other Ambulatory Visit: Payer: Self-pay

## 2021-11-28 ENCOUNTER — Encounter (HOSPITAL_COMMUNITY)
Admission: EM | Disposition: A | Discharge status: Skilled Nursing Facility | Payer: Self-pay | Source: Home / Self Care | Attending: Internal Medicine

## 2021-11-28 DIAGNOSIS — Y92009 Unspecified place in unspecified non-institutional (private) residence as the place of occurrence of the external cause: Secondary | ICD-10-CM | POA: Diagnosis not present

## 2021-11-28 DIAGNOSIS — W19XXXA Unspecified fall, initial encounter: Secondary | ICD-10-CM | POA: Diagnosis not present

## 2021-11-28 DIAGNOSIS — M9702XA Periprosthetic fracture around internal prosthetic left hip joint, initial encounter: Secondary | ICD-10-CM

## 2021-11-28 DIAGNOSIS — S7292XS Unspecified fracture of left femur, sequela: Secondary | ICD-10-CM | POA: Diagnosis not present

## 2021-11-28 DIAGNOSIS — J479 Bronchiectasis, uncomplicated: Secondary | ICD-10-CM | POA: Diagnosis not present

## 2021-11-28 HISTORY — PX: TOTAL HIP REVISION: SHX763

## 2021-11-28 LAB — PREPARE RBC (CROSSMATCH)

## 2021-11-28 LAB — CBC
HCT: 30.8 % — ABNORMAL LOW (ref 36.0–46.0)
Hemoglobin: 9.7 g/dL — ABNORMAL LOW (ref 12.0–15.0)
MCH: 30.5 pg (ref 26.0–34.0)
MCHC: 31.5 g/dL (ref 30.0–36.0)
MCV: 96.9 fL (ref 80.0–100.0)
Platelets: 243 10*3/uL (ref 150–400)
RBC: 3.18 MIL/uL — ABNORMAL LOW (ref 3.87–5.11)
RDW: 13.6 % (ref 11.5–15.5)
WBC: 12.5 10*3/uL — ABNORMAL HIGH (ref 4.0–10.5)
nRBC: 0 % (ref 0.0–0.2)

## 2021-11-28 LAB — BASIC METABOLIC PANEL
Anion gap: 6 (ref 5–15)
BUN: 26 mg/dL — ABNORMAL HIGH (ref 8–23)
CO2: 23 mmol/L (ref 22–32)
Calcium: 7.9 mg/dL — ABNORMAL LOW (ref 8.9–10.3)
Chloride: 103 mmol/L (ref 98–111)
Creatinine, Ser: 0.54 mg/dL (ref 0.44–1.00)
GFR, Estimated: >60 mL/min (ref >60)
Glucose, Bld: 86 mg/dL (ref 70–99)
Potassium: 4.5 mmol/L (ref 3.5–5.1)
Sodium: 132 mmol/L — ABNORMAL LOW (ref 135–145)

## 2021-11-28 LAB — URINALYSIS, ROUTINE W REFLEX MICROSCOPIC
Bilirubin Urine: NEGATIVE
Glucose, UA: NEGATIVE mg/dL
Ketones, ur: 20 mg/dL — AB
Nitrite: NEGATIVE
Protein, ur: >=300 mg/dL — AB
Specific Gravity, Urine: 1.022 (ref 1.005–1.030)
WBC, UA: >50 WBC/hpf — ABNORMAL HIGH (ref 0–5)
pH: 5 (ref 5.0–8.0)

## 2021-11-28 SURGERY — TOTAL HIP REVISION
Anesthesia: General | Site: Hip | Laterality: Left

## 2021-11-28 MED ORDER — FENTANYL CITRATE (PF) 100 MCG/2ML IJ SOLN
INTRAMUSCULAR | Status: AC
  Filled 2021-11-28: qty 2

## 2021-11-28 MED ORDER — PHENYLEPHRINE HCL-NACL 20-0.9 MG/250ML-% IV SOLN
INTRAVENOUS | Status: DC | PRN
  Administered 2021-11-28: 30 ug/min via INTRAVENOUS

## 2021-11-28 MED ORDER — OXYCODONE HCL 5 MG/5ML PO SOLN: 5.0000 mg | Freq: Once | ORAL | Status: DC | PRN

## 2021-11-28 MED ORDER — ONDANSETRON HCL 4 MG/2ML IJ SOLN
INTRAMUSCULAR | Status: AC
  Filled 2021-11-28: qty 2

## 2021-11-28 MED ORDER — DEXAMETHASONE SODIUM PHOSPHATE 10 MG/ML IJ SOLN
INTRAMUSCULAR | Status: AC
  Filled 2021-11-28: qty 1

## 2021-11-28 MED ORDER — ORAL CARE MOUTH RINSE: 15.0000 mL | OROMUCOSAL | Status: DC | PRN

## 2021-11-28 MED ORDER — TRANEXAMIC ACID 1000 MG/10ML IV SOLN
INTRAVENOUS | Status: DC | PRN
  Administered 2021-11-28: 2000 mg via TOPICAL

## 2021-11-28 MED ORDER — DEXAMETHASONE SODIUM PHOSPHATE 10 MG/ML IJ SOLN
INTRAMUSCULAR | Status: DC | PRN
  Administered 2021-11-28: 5 mg via INTRAVENOUS

## 2021-11-28 MED ORDER — CEFAZOLIN SODIUM-DEXTROSE 2-4 GM/100ML-% IV SOLN
2.0000 g | Freq: Three times a day (TID) | INTRAVENOUS | Status: AC
  Administered 2021-11-28 – 2021-12-01 (×9): 2 g via INTRAVENOUS
  Filled 2021-11-28 (×9): qty 100

## 2021-11-28 MED ORDER — SODIUM CHLORIDE (PF) 0.9 % IJ SOLN
INTRAMUSCULAR | Status: AC
  Filled 2021-11-28: qty 50

## 2021-11-28 MED ORDER — HYDROMORPHONE HCL 1 MG/ML IJ SOLN
INTRAMUSCULAR | Status: AC
  Administered 2021-11-28: 0.5 mg via INTRAVENOUS
  Filled 2021-11-28: qty 1

## 2021-11-28 MED ORDER — BUPIVACAINE LIPOSOME 1.3 % IJ SUSP
INTRAMUSCULAR | Status: DC | PRN
  Administered 2021-11-28: 20 mL

## 2021-11-28 MED ORDER — PHENYLEPHRINE HCL-NACL 20-0.9 MG/250ML-% IV SOLN: INTRAVENOUS | Status: DC | PRN

## 2021-11-28 MED ORDER — ONDANSETRON HCL 4 MG/2ML IJ SOLN: 4.0000 mg | Freq: Once | INTRAMUSCULAR | Status: DC | PRN

## 2021-11-28 MED ORDER — POVIDONE-IODINE 10 % EX SWAB: 2.0000 | Freq: Once | CUTANEOUS | Status: DC

## 2021-11-28 MED ORDER — ACETAMINOPHEN 500 MG PO TABS: 1000.0000 mg | ORAL_TABLET | Freq: Once | ORAL | Status: DC

## 2021-11-28 MED ORDER — PHENYLEPHRINE HCL (PRESSORS) 10 MG/ML IV SOLN
INTRAVENOUS | Status: AC
  Filled 2021-11-28: qty 1

## 2021-11-28 MED ORDER — ORAL CARE MOUTH RINSE: 15.0000 mL | Freq: Once | OROMUCOSAL | Status: AC

## 2021-11-28 MED ORDER — LIDOCAINE 2% (20 MG/ML) 5 ML SYRINGE
INTRAMUSCULAR | Status: DC | PRN
  Administered 2021-11-28: 60 mg via INTRAVENOUS

## 2021-11-28 MED ORDER — SODIUM CHLORIDE (PF) 0.9 % IJ SOLN
INTRAMUSCULAR | Status: AC
  Filled 2021-11-28: qty 10

## 2021-11-28 MED ORDER — PROPOFOL 10 MG/ML IV BOLUS
INTRAVENOUS | Status: AC
  Filled 2021-11-28: qty 20

## 2021-11-28 MED ORDER — POLYVINYL ALCOHOL 1.4 % OP SOLN
1.0000 [drp] | Freq: Three times a day (TID) | OPHTHALMIC | Status: DC | PRN
Start: 2021-11-28 — End: 2021-12-02
  Filled 2021-11-28: qty 15

## 2021-11-28 MED ORDER — ROCURONIUM BROMIDE 10 MG/ML (PF) SYRINGE
PREFILLED_SYRINGE | INTRAVENOUS | Status: AC
  Filled 2021-11-28: qty 10

## 2021-11-28 MED ORDER — LACTATED RINGERS IV SOLN: INTRAVENOUS | Status: DC

## 2021-11-28 MED ORDER — TRANEXAMIC ACID-NACL 1000-0.7 MG/100ML-% IV SOLN
1000.0000 mg | INTRAVENOUS | Status: AC
  Administered 2021-11-28: 1000 mg via INTRAVENOUS
  Filled 2021-11-28 (×2): qty 100

## 2021-11-28 MED ORDER — BUPIVACAINE LIPOSOME 1.3 % IJ SUSP
INTRAMUSCULAR | Status: AC
  Filled 2021-11-28: qty 20

## 2021-11-28 MED ORDER — ROCURONIUM BROMIDE 100 MG/10ML IV SOLN
INTRAVENOUS | Status: DC | PRN
  Administered 2021-11-28: 60 mg via INTRAVENOUS

## 2021-11-28 MED ORDER — ASPIRIN 81 MG PO TBEC
81.0000 mg | DELAYED_RELEASE_TABLET | Freq: Two times a day (BID) | ORAL | Status: DC
  Administered 2021-11-29 – 2021-12-02 (×7): 81 mg via ORAL
  Filled 2021-11-28 (×7): qty 1

## 2021-11-28 MED ORDER — BUPIVACAINE LIPOSOME 1.3 % IJ SUSP
10.0000 mL | Freq: Once | INTRAMUSCULAR | Status: DC
  Filled 2021-11-28: qty 10

## 2021-11-28 MED ORDER — SODIUM CHLORIDE 0.9 % IR SOLN
Status: DC | PRN
  Administered 2021-11-28: 3000 mL

## 2021-11-28 MED ORDER — ONDANSETRON HCL 4 MG/2ML IJ SOLN
INTRAMUSCULAR | Status: DC | PRN
  Administered 2021-11-28: 4 mg via INTRAVENOUS

## 2021-11-28 MED ORDER — CHLORHEXIDINE GLUCONATE 0.12 % MT SOLN
15.0000 mL | Freq: Once | OROMUCOSAL | Status: AC
  Administered 2021-11-28: 15 mL via OROMUCOSAL

## 2021-11-28 MED ORDER — SODIUM CHLORIDE 0.9% IV SOLUTION: Freq: Once | INTRAVENOUS | Status: DC

## 2021-11-28 MED ORDER — 0.9 % SODIUM CHLORIDE (POUR BTL) OPTIME
TOPICAL | Status: DC | PRN
  Administered 2021-11-28: 1000 mL

## 2021-11-28 MED ORDER — ISOPROPYL ALCOHOL 70 % SOLN
Status: DC | PRN
  Administered 2021-11-28: 1 via TOPICAL

## 2021-11-28 MED ORDER — CLARITHROMYCIN 250 MG PO TABS
250.0000 mg | ORAL_TABLET | ORAL | Status: DC
Start: 2021-11-28 — End: 2021-11-28

## 2021-11-28 MED ORDER — OXYCODONE HCL 5 MG PO TABS: 5.0000 mg | ORAL_TABLET | Freq: Once | ORAL | Status: DC | PRN

## 2021-11-28 MED ORDER — PROPOFOL 10 MG/ML IV BOLUS
INTRAVENOUS | Status: DC | PRN
  Administered 2021-11-28: 100 mg via INTRAVENOUS

## 2021-11-28 MED ORDER — SODIUM CHLORIDE (PF) 0.9 % IJ SOLN
INTRAMUSCULAR | Status: DC | PRN
  Administered 2021-11-28: 60 mL

## 2021-11-28 MED ORDER — HYDROMORPHONE HCL 1 MG/ML IJ SOLN
0.2500 mg | INTRAMUSCULAR | Status: DC | PRN
  Administered 2021-11-28 (×2): 0.5 mg via INTRAVENOUS

## 2021-11-28 MED ORDER — CEFAZOLIN SODIUM-DEXTROSE 2-4 GM/100ML-% IV SOLN
2.0000 g | INTRAVENOUS | Status: AC
  Administered 2021-11-28: 2 g via INTRAVENOUS
  Filled 2021-11-28 (×2): qty 100

## 2021-11-28 MED ORDER — TRANEXAMIC ACID-NACL 1000-0.7 MG/100ML-% IV SOLN
INTRAVENOUS | Status: AC
  Filled 2021-11-28: qty 100

## 2021-11-28 MED ORDER — DEXAMETHASONE SODIUM PHOSPHATE 10 MG/ML IJ SOLN: 8.0000 mg | Freq: Once | INTRAMUSCULAR | Status: DC

## 2021-11-28 MED ORDER — CEFAZOLIN SODIUM-DEXTROSE 2-4 GM/100ML-% IV SOLN
2.0000 g | Freq: Three times a day (TID) | INTRAVENOUS | Status: DC
  Administered 2021-11-28: 2 g via INTRAVENOUS
  Filled 2021-11-28: qty 100

## 2021-11-28 MED ORDER — FENTANYL CITRATE (PF) 100 MCG/2ML IJ SOLN
INTRAMUSCULAR | Status: DC | PRN
  Administered 2021-11-28 (×6): 25 ug via INTRAVENOUS
  Administered 2021-11-28: 50 ug via INTRAVENOUS

## 2021-11-28 MED ORDER — SUGAMMADEX SODIUM 200 MG/2ML IV SOLN
INTRAVENOUS | Status: DC | PRN
  Administered 2021-11-28: 150 mg via INTRAVENOUS

## 2021-11-28 MED ORDER — PHENYLEPHRINE 80 MCG/ML (10ML) SYRINGE FOR IV PUSH (FOR BLOOD PRESSURE SUPPORT)
PREFILLED_SYRINGE | INTRAVENOUS | Status: DC | PRN
  Administered 2021-11-28 (×2): 80 ug via INTRAVENOUS
  Administered 2021-11-28: 160 ug via INTRAVENOUS
  Administered 2021-11-28 (×2): 80 ug via INTRAVENOUS

## 2021-11-28 MED ORDER — CEFAZOLIN SODIUM-DEXTROSE 2-4 GM/100ML-% IV SOLN
2.0000 g | Freq: Four times a day (QID) | INTRAVENOUS | Status: DC
  Filled 2021-11-28 (×2): qty 100

## 2021-11-28 SURGICAL SUPPLY — 78 items
ADH SKN CLS APL DERMABOND .7 (GAUZE/BANDAGES/DRESSINGS) ×1
APL PRP STRL LF DISP 70% ISPRP (MISCELLANEOUS) ×2
BAG COUNTER SPONGE SURGICOUNT (BAG) IMPLANT
BAG DECANTER FOR FLEXI CONT (MISCELLANEOUS) ×2 IMPLANT
BAG SPEC THK2 15X12 ZIP CLS (MISCELLANEOUS) ×1
BAG SPNG CNTER NS LX DISP (BAG) ×1
BAG ZIPLOCK 12X15 (MISCELLANEOUS) ×2 IMPLANT
BLADE SAW SAG 25X90X1.19 (BLADE) IMPLANT
BLADE SAW SGTL 81X20 HD (BLADE) ×2 IMPLANT
CHLORAPREP W/TINT 26 (MISCELLANEOUS) ×4 IMPLANT
COVER SURGICAL LIGHT HANDLE (MISCELLANEOUS) ×2 IMPLANT
DERMABOND ADVANCED (GAUZE/BANDAGES/DRESSINGS) ×1
DERMABOND ADVANCED .7 DNX12 (GAUZE/BANDAGES/DRESSINGS) ×2 IMPLANT
DRAPE 3/4 80X56 (DRAPES) ×4 IMPLANT
DRAPE C-ARM 42X120 X-RAY (DRAPES) ×2 IMPLANT
DRAPE C-ARMOR (DRAPES) IMPLANT
DRAPE HIP W/POCKET STRL (MISCELLANEOUS) ×2 IMPLANT
DRAPE INCISE IOBAN 66X45 STRL (DRAPES) ×2 IMPLANT
DRAPE INCISE IOBAN 85X60 (DRAPES) ×2 IMPLANT
DRAPE POUCH INSTRU U-SHP 10X18 (DRAPES) ×2 IMPLANT
DRAPE U-SHAPE 47X51 STRL (DRAPES) ×2 IMPLANT
DRESSING AQUACEL AG SP 3.5X10 (GAUZE/BANDAGES/DRESSINGS) ×2 IMPLANT
DRSG AQUACEL AG ADV 3.5X14 (GAUZE/BANDAGES/DRESSINGS) IMPLANT
DRSG AQUACEL AG SP 3.5X10 (GAUZE/BANDAGES/DRESSINGS) ×1
ELECT BLADE TIP CTD 4 INCH (ELECTRODE) ×2 IMPLANT
ELECT NDL TIP 2.8 STRL (NEEDLE) ×2 IMPLANT
ELECT NEEDLE TIP 2.8 STRL (NEEDLE) ×1 IMPLANT
ELECT REM PT RETURN 15FT ADLT (MISCELLANEOUS) ×2 IMPLANT
GLOVE BIOGEL PI IND STRL 8 (GLOVE) ×4 IMPLANT
GLOVE BIOGEL PI INDICATOR 8 (GLOVE) ×2
GLOVE SURG LX 8.0 MICRO (GLOVE) ×2
GLOVE SURG LX STRL 8.0 MICRO (GLOVE) ×4 IMPLANT
GLOVE SURG ORTHO 8.0 STRL STRW (GLOVE) ×2 IMPLANT
GOWN STRL REUS W/ TWL XL LVL3 (GOWN DISPOSABLE) ×2 IMPLANT
GOWN STRL REUS W/TWL XL LVL3 (GOWN DISPOSABLE) ×1
HANDPIECE INTERPULSE COAX TIP (DISPOSABLE) ×1
HEAD BIOLOX HIP 36/+2.5 (Joint) IMPLANT
HIP BIOLOX HD 36/+2.5 (Joint) ×1 IMPLANT
HOLDER FOLEY CATH W/STRAP (MISCELLANEOUS) ×2 IMPLANT
HOOD PEEL AWAY FLYTE STAYCOOL (MISCELLANEOUS) ×6 IMPLANT
KIT BASIN OR (CUSTOM PROCEDURE TRAY) ×2 IMPLANT
KIT TURNOVER KIT A (KITS) IMPLANT
MANIFOLD NEPTUNE II (INSTRUMENTS) ×2 IMPLANT
MARKER SKIN DUAL TIP RULER LAB (MISCELLANEOUS) ×2 IMPLANT
NDL SAFETY ECLIP 18X1.5 (MISCELLANEOUS) IMPLANT
NEEDLE HYPO 22GX1.5 SAFETY (NEEDLE) IMPLANT
NS IRRIG 1000ML POUR BTL (IV SOLUTION) ×2 IMPLANT
PACK TOTAL JOINT (CUSTOM PROCEDURE TRAY) ×2 IMPLANT
PLATE TROCH GRIP W/2 CABLES (Plate) IMPLANT
PROTECTOR NERVE ULNAR (MISCELLANEOUS) ×2 IMPLANT
REST MOD HIP SYS SZ23 +0 V40 (Hips) ×1 IMPLANT
RESTORATION MODULAR HIP SYSTEM 195MMX17MM (Orthopedic Implant) IMPLANT
RETRIEVER SUT HEWSON (MISCELLANEOUS) ×2 IMPLANT
SEALER BIPOLAR AQUA 6.0 (INSTRUMENTS) ×2 IMPLANT
SET HNDPC FAN SPRY TIP SCT (DISPOSABLE) IMPLANT
SLEEVE CABLE 2MM VT (Orthopedic Implant) IMPLANT
SOLUTION IRRIG SURGIPHOR (IV SOLUTION) ×2 IMPLANT
SPIKE FLUID TRANSFER (MISCELLANEOUS) ×6 IMPLANT
SPONGE T-LAP 18X18 ~~LOC~~+RFID (SPONGE) IMPLANT
STEM FEM MODULAR SYS 195X17MM (Stem) IMPLANT
SUCTION FRAZIER HANDLE 12FR (TUBING) ×1
SUCTION TUBE FRAZIER 12FR DISP (TUBING) ×2 IMPLANT
SUT BONE WAX W31G (SUTURE) ×2 IMPLANT
SUT ETHIBOND #5 BRAIDED 30INL (SUTURE) ×2 IMPLANT
SUT MNCRL AB 3-0 PS2 18 (SUTURE) ×2 IMPLANT
SUT STRATAFIX 0 PDS 27 VIOLET (SUTURE) ×1
SUT STRATAFIX PDO 1 14 VIOLET (SUTURE) ×1
SUT STRATFX PDO 1 14 VIOLET (SUTURE) ×1
SUT VIC AB 2-0 CT2 27 (SUTURE) ×4 IMPLANT
SUTURE STRATFX 0 PDS 27 VIOLET (SUTURE) ×2 IMPLANT
SUTURE STRATFX PDO 1 14 VIOLET (SUTURE) ×2 IMPLANT
SYR 20ML LL LF (SYRINGE) ×4 IMPLANT
SYSTEM REST MOD HIP SZ23+0 V40 (Hips) IMPLANT
TOWEL OR 17X26 10 PK STRL BLUE (TOWEL DISPOSABLE) ×2 IMPLANT
TRAY FOLEY MTR SLVR 16FR STAT (SET/KITS/TRAYS/PACK) ×2 IMPLANT
TRAY FOLEY W/BAG SLVR 14FR LF (SET/KITS/TRAYS/PACK) IMPLANT
UNDERPAD 30X36 HEAVY ABSORB (UNDERPADS AND DIAPERS) ×2 IMPLANT
WATER STERILE IRR 1000ML POUR (IV SOLUTION) ×4 IMPLANT

## 2021-11-28 NOTE — Interval H&P Note (Signed)
The patient has been re-examined, and the chart reviewed, and there have been no interval changes to the documented history and physical.    Plan for admitted overnight for revision left total hip arthroplasty for periprosthetic fracture.  The operative side was examined and the patient was confirmed to have sensation to DPN, SPN, TN intact, Motor EHL, ext, flex 5/5, and DP 2+, PT 2+.  Moderate swelling left thigh with associated ecchymosis.  Skin intact.  Anterior incision appears benign.   The risks, benefits, and alternatives have been discussed at length with patient, and the patient is willing to proceed.  Left hip marked. Consent has been signed.

## 2021-11-28 NOTE — Anesthesia Preprocedure Evaluation (Addendum)
Anesthesia Evaluation  Patient identified by MRN, date of birth, ID band Patient awake    Reviewed: Allergy & Precautions, NPO status , Patient's Chart, lab work & pertinent test results  Airway Mallampati: II  TM Distance: >3 FB Neck ROM: Full    Dental  (+) Teeth Intact, Dental Advisory Given   Pulmonary COPD,  COPD inhaler,    Pulmonary exam normal breath sounds clear to auscultation       Cardiovascular negative cardio ROS Normal cardiovascular exam Rhythm:Regular Rate:Normal     Neuro/Psych negative neurological ROS  negative psych ROS   GI/Hepatic Neg liver ROS, GERD  Controlled,  Endo/Other  negative endocrine ROS  Renal/GU negative Renal ROS  negative genitourinary   Musculoskeletal  (+) Arthritis , Osteoarthritis,  L hip periprosthetic fx s/p L THR 11/22/21   Abdominal   Peds  Hematology  (+) Blood dyscrasia, anemia , Hb 9.7, plt 243   Anesthesia Other Findings   Reproductive/Obstetrics negative OB ROS                           Anesthesia Physical Anesthesia Plan  ASA: 2  Anesthesia Plan: General   Post-op Pain Management: Tylenol PO (pre-op)*   Induction:   PONV Risk Score and Plan: 2 and Ondansetron, Dexamethasone and Treatment may vary due to age or medical condition  Airway Management Planned: Oral ETT  Additional Equipment: None  Intra-op Plan:   Post-operative Plan: Extubation in OR  Informed Consent: I have reviewed the patients History and Physical, chart, labs and discussed the procedure including the risks, benefits and alternatives for the proposed anesthesia with the patient or authorized representative who has indicated his/her understanding and acceptance.     Dental advisory given  Plan Discussed with: CRNA  Anesthesia Plan Comments: (Type and screen, starting hb 9.7)       Anesthesia Quick Evaluation

## 2021-11-28 NOTE — Progress Notes (Signed)
PROGRESS NOTE    Mary [name]  TDD:[id number] DOB: [birth year] DOA: 11/26/2021 PCP: [name], DO    Brief Narrative:   Mary [name] is a 76 y.o. 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  Patient had left hip replacement 4 days ago and has been doing well with physical therapy and has been able to ambulate with a walker since the last 2 days.  Patient needed to use the restroom (unfortunately, husband already stepped out on an errand), on returning from the restroom, while trying to sit down, she accidentally fell with her right hip on the walker, thereby sustaining pain rated as 10/10 on pain scale.   Found to have a fracture.  Assessment and Plan: Left femoral fracture CT of left lower extremity showed acute comminuted fracture of the mid and proximal femur from the level of the greater trochanter to the level of the mid diaphysis She was treated with fentanyl, Dilaudid, morphine  -going to the OR 8/28 with ortho -PT eval  Fall at home Continue fall precaution -PT eval   Hyponatremia -trend   Bronchiectasis -pulmonology toilet    DVT prophylaxis: TED hose to OR Start: 11/28/21 0210 SCDs Start: 11/26/21 2115    Code Status: Full Code Family Communication:   Disposition Plan:  Level of care: Med-Surg Status is: Inpatient Remains inpatient appropriate because: needs surgery    Consultants:  ortho   Subjective: Headed to the OR  Objective: Vitals:   11/27/21 2241 11/28/21 0551 11/28/21 0900 11/28/21 0914  BP: (!) 118/53 (!) 125/59 (!) 124/51   Pulse: (!) 101 94 88   Resp: 18 (!) 24 16   Temp: 98.4 F (36.9 C) 98.6 F (37 C) 98.7 F (37.1 C)   TempSrc: Oral Oral Oral   SpO2: 91% 93% 100%    Weight:    50.5 kg  Height:    5\' 4"  (1.626 m)    Intake/Output Summary (Last 24 hours) at 11/28/2021 1248 Last data filed at 11/28/2021 1214 Gross per 24 hour  Intake 1613.83 ml  Output 1250 ml  Net 363.83 ml   Filed Weights   11/26/21 1411 11/27/21 0120 11/28/21 0914  Weight: 47.2 kg 50.5 kg 50.5 kg    Examination:    General: Appearance:    Thin female in no acute distress           Data Reviewed: I have personally reviewed following labs and imaging studies  CBC: Recent Labs  Lab 11/26/21 1817 11/27/21 0536 11/28/21 0458  WBC 7.1 8.5 12.5*  NEUTROABS 5.6  --   --   HGB 10.8* 10.7* 9.7*  HCT 32.4* 32.3* 30.8*  MCV 92.8 92.6 96.9  PLT 226 268 243   Basic Metabolic Panel: Recent Labs  Lab 11/26/21 1817 11/27/21 0536 11/28/21 0458  NA 131* 135 132*  K 3.9 4.9 4.5  CL 103 105 103  CO2 20* 23 23  GLUCOSE 135* 114* 86  BUN 18 26* 26*  CREATININE 0.46 0.60 0.54  CALCIUM 7.9* 8.2* 7.9*  MG  --  2.2  --   PHOS  --  3.7  --    GFR: Estimated Creatinine Clearance: 47.7 mL/min (by C-G formula based on SCr of 0.54 mg/dL). Liver Function  Tests: Recent Labs  Lab 11/27/21 0536  AST 38  ALT 26  ALKPHOS 100  BILITOT 1.2  PROT 6.2*  ALBUMIN 3.3*   No results for input(s): "LIPASE", "AMYLASE" in the last 168 hours. No results for input(s): "AMMONIA" in the last 168 hours. Coagulation Profile: No results for input(s): "INR", "PROTIME" in the last 168 hours. Cardiac Enzymes: No results for input(s): "CKTOTAL", "CKMB", "CKMBINDEX", "TROPONINI" in the last 168 hours. BNP (last 3 results) No results for input(s): "PROBNP" in the last 8760 hours. HbA1C: No results for input(s): "HGBA1C" in the last 72 hours. CBG: No results for input(s): "GLUCAP" in the last 168 hours. Lipid Profile: No results for input(s): "CHOL", "HDL", "LDLCALC", "TRIG", "CHOLHDL", "LDLDIRECT" in the last 72 hours. Thyroid Function Tests: No results for input(s): "TSH", "T4TOTAL",  "FREET4", "T3FREE", "THYROIDAB" in the last 72 hours. Anemia Panel: No results for input(s): "VITAMINB12", "FOLATE", "FERRITIN", "TIBC", "IRON", "RETICCTPCT" in the last 72 hours. Sepsis Labs: No results for input(s): "PROCALCITON", "LATICACIDVEN" in the last 168 hours.  No results found for this or any previous visit (from the past 240 hour(s)).       Radiology Studies: DG Hip Unilat W or Wo Pelvis 2-3 Views Left  Result Date: 11/26/2021 CLINICAL DATA:  Left hip replacement four days ago, fell today with increasing pain. EXAM: DG HIP (WITH OR WITHOUT PELVIS) 2-3V LEFT; LEFT FEMUR 2 VIEWS COMPARISON:  CT left femur earlier today. FINDINGS: There is osteopenia.  Left hip arthroplasty again noted. No hardware dislocation. Comminuted proximal left femoral fracture again is seen. This consists of a longitudinal fracture through the greater trochanter alongside the hardware, with the fragment displaced 6 mm laterally and slightly cephalad displaced, and an oblique proximal shaft fracture alongside the distal femoral hardware stem. The main distal fracture fragment is angled medially, laterally displaced by about 1 cortex width and with dorsal overriding of up to 7.5 cm on some images. There is a linear track of soft tissue air at the fat muscle interface in the upper thigh which is probably residual from the hip replacement surgery. Moderately advanced right hip DJD is incidentally noted, and healed fracture deformities of the left pubic rami. IMPRESSION: Perihardware proximal left femoral fractures with distal fragment dorsal overriding and medial angulation, with the greater trochanter fracture only slightly displaced. Osteopenia and degenerative change. No dislocation. Electronically Signed   By: Almira Bar M.D.   On: 11/26/2021 20:19   DG Femur Min 2 Views Left  Result Date: 11/26/2021 CLINICAL DATA:  Left hip replacement four days ago, fell today with increasing pain. EXAM: DG HIP (WITH OR  WITHOUT PELVIS) 2-3V LEFT; LEFT FEMUR 2 VIEWS COMPARISON:  CT left femur earlier today. FINDINGS: There is osteopenia.  Left hip arthroplasty again noted. No hardware dislocation. Comminuted proximal left femoral fracture again is seen. This consists of a longitudinal fracture through the greater trochanter alongside the hardware, with the fragment displaced 6 mm laterally and slightly cephalad displaced, and an oblique proximal shaft fracture alongside the distal femoral hardware stem. The main distal fracture fragment is angled medially, laterally displaced by about 1 cortex width and with dorsal overriding of up to 7.5 cm on some images. There is a linear track of soft tissue air at the fat muscle interface in the upper thigh which is probably residual from the hip replacement surgery. Moderately advanced right hip DJD is incidentally noted, and healed fracture deformities of the left pubic rami. IMPRESSION: Perihardware proximal left femoral fractures with  distal fragment dorsal overriding and medial angulation, with the greater trochanter fracture only slightly displaced. Osteopenia and degenerative change. No dislocation. Electronically Signed   By: Almira Bar M.D.   On: 11/26/2021 20:19   CT FEMUR LEFT WO CONTRAST  Result Date: 11/26/2021 CLINICAL DATA:  Hip trauma. EXAM: CT OF THE LOWER LEFT EXTREMITY WITHOUT CONTRAST TECHNIQUE: Multidetector CT imaging of the lower left extremity was performed according to the standard protocol. RADIATION DOSE REDUCTION: This exam was performed according to the departmental dose-optimization program which includes automated exposure control, adjustment of the mA and/or kV according to patient size and/or use of iterative reconstruction technique. COMPARISON:  None Available. FINDINGS: Bones/Joint/Cartilage There is a left hip arthroplasty present in anatomic alignment. There is a comminuted nondisplaced fracture in the region of the greater trochanter. There is  oblique displaced fracture extending from the level of the greater trochanter two proximally 2 cm distal to the tip of the femoral prosthesis. This fracture is displaced distal to the level of the prosthesis with apex lateral angulation and 4.5 cm of overlap. There is no dislocation. Ligaments Suboptimally assessed by CT. Muscles and Tendons There is intramuscular edema surrounding the fracture sites. There is a small amount of intramuscular air anterior to the hip joint. Soft tissues There is subcutaneous edema of the anterior thigh. Bladder is markedly distended. IMPRESSION: 1. Left hip arthroplasty appears in anatomic alignment. 2. Acute comminuted fracture of the mid and proximal femur from the level of the greater trochanter to the level of the mid diaphysis. 2 cm distal to the femoral component of the arthroplasty there is a displaced angulated fracture with overlap. Electronically Signed   By: Darliss Cheney M.D.   On: 11/26/2021 18:38   CT Hip Right Wo Contrast  Result Date: 11/26/2021 CLINICAL DATA:  Fall, right hip pain EXAM: CT OF THE RIGHT HIP WITHOUT CONTRAST TECHNIQUE: Multidetector CT imaging of the right hip was performed according to the standard protocol. Multiplanar CT image reconstructions were also generated. RADIATION DOSE REDUCTION: This exam was performed according to the departmental dose-optimization program which includes automated exposure control, adjustment of the mA and/or kV according to patient size and/or use of iterative reconstruction technique. COMPARISON:  Pelvic radiograph dated 11/26/2021 FINDINGS: No fracture or dislocation is seen. Moderate degenerative changes of the right hip. Visualized bony pelvis appears intact. Visualized soft tissue pelvis is unremarkable, noting bladder distension and colonic diverticulosis. IMPRESSION: Moderate degenerative changes of the right hip. No fracture or dislocation is seen. Electronically Signed   By: Charline Bills M.D.   On:  11/26/2021 18:31   DG Pelvis 1-2 Views  Result Date: 11/26/2021 CLINICAL DATA:  Status post left hip arthroplasty. EXAM: PELVIS - 1 VIEW COMPARISON:  None Available. FINDINGS: Exam is technically suboptimal due to patient's inability to cooperate with positioning. A bipolar left hip prosthesis is seen in expected position. Old fracture deformity of left superior pubic ramus noted. Severe right hip osteoarthritis also seen. IMPRESSION: Technically suboptimal exam. Bipolar left hip prosthesis in expected position. Severe right hip osteoarthritis. Electronically Signed   By: Danae Orleans M.D.   On: 11/26/2021 15:49        Scheduled Meds:  sodium chloride   Intravenous Once   sodium chloride   Intravenous Once   acetaminophen  1,000 mg Oral Once   [MAR Hold] acetaminophen  500 mg Oral Q8H   bupivacaine liposome  10 mL Other Once   dexamethasone (DECADRON) injection  8 mg Intravenous Once   [  MAR Hold] gabapentin  100 mg Oral QHS   [MAR Hold] pantoprazole (PROTONIX) IV  40 mg Intravenous Q24H   povidone-iodine  2 Application Topical Once   povidone-iodine  2 Application Topical Once   [MAR Hold] sodium chloride HYPERTONIC  4 mL Nebulization Daily   [MAR Hold] traMADol  50 mg Oral Q6H   Continuous Infusions:  sodium chloride 50 mL/hr at 11/27/21 1547   lactated ringers 10 mL/hr at 11/28/21 1034   tranexamic acid       LOS: 2 days    Time spent: 45 minutes spent on chart review, discussion with nursing staff, consultants, updating family and interview/physical exam; more than 50% of that time was spent in counseling and/or coordination of care.    Joseph Art, DO [facility] Available via Epic secure chat 7am-7pm After these hours, please refer to coverage provider listed on [email or web address] 11/28/2021, 12:48 PM

## 2021-11-28 NOTE — Anesthesia Procedure Notes (Signed)
Procedure Name: Intubation Date/Time: 11/28/2021 10:39 AM  Performed by: West Pugh, CRNAPre-anesthesia Checklist: Patient identified, Emergency Drugs available, Suction available, Patient being monitored and Timeout performed Patient Re-evaluated:Patient Re-evaluated prior to induction Oxygen Delivery Method: Circle system utilized Preoxygenation: Pre-oxygenation with 100% oxygen Induction Type: IV induction Ventilation: Mask ventilation without difficulty Laryngoscope Size: Mac and 3 Grade View: Grade I Tube type: Oral Tube size: 7.0 mm Number of attempts: 1 Airway Equipment and Method: Stylet Placement Confirmation: ETT inserted through vocal cords under direct vision, positive ETCO2, CO2 detector and breath sounds checked- equal and bilateral Secured at: 22 cm Tube secured with: Tape Dental Injury: Teeth and Oropharynx as per pre-operative assessment  Comments: AOI

## 2021-11-28 NOTE — Transfer of Care (Signed)
Immediate Anesthesia Transfer of Care Note  Patient: Mary [name]  Procedure(s) Performed: TOTAL HIP REVISION (Left: Hip)  Patient Location: PACU  Anesthesia Type:General  Level of Consciousness: awake  Airway & Oxygen Therapy: Patient Spontanous Breathing and Patient connected to face mask oxygen  Post-op Assessment: Report given to RN and Post -op Vital signs reviewed and stable  Post vital signs: Reviewed and stable  Last Vitals:  Vitals Value Taken Time  BP 147/64 11/28/21 1406  Temp    Pulse 92 11/28/21 1412  Resp 16 11/28/21 1411  SpO2 100 % 11/28/21 1412  Vitals shown include unvalidated device data.  Last Pain:  Vitals:   11/28/21 0914  TempSrc:   PainSc: 0-No pain      Patients Stated Pain Goal: 3 (11/28/21 0914)  Complications: No notable events documented.

## 2021-11-28 NOTE — Op Note (Addendum)
11/26/2021 - 11/28/2021  1:41 PM  PATIENT:  Mary [name]   MRN: [id number]  PRE-OPERATIVE DIAGNOSIS: Left periprosthetic femur fracture after anterior total hip arthroplasty  POST-OPERATIVE DIAGNOSIS:  same  PROCEDURE:  Procedure(s): Open reduction internal fixation left periprosthetic femur fracture with cerclage cabling, revision femoral component, ORIF of greater trochanter fracture with trochanter plate.  PREOPERATIVE INDICATIONS:    Mary [name] is an 76 y.o. female who had undergone direct anterior total of arthroplasty with my partner Dr. Percell Miller last week.  Patient discharged home unfortunately had a fall at home sustaining a periprosthetic femur fracture.  Elected for surgical management the risks benefits and alternatives were discussed with the patient including but not limited to the risks of nonoperative treatment, versus surgical intervention including infection, bleeding, nerve injury, periprosthetic fracture, the need for revision surgery, dislocation, leg length discrepancy, blood clots, cardiopulmonary complications, morbidity, mortality, among others, and they were willing to proceed.     OPERATIVE REPORT     SURGEON:  Charlies Constable, MD Co-surgeon: Frederik Pear, MD, Dr. Chester Holstein assistant was necessary for safe and timely completion of the procedure given the complex nature of this revision case.    ASSISTANT: Joanell Rising, PA-C, (Present throughout the entire procedure,  necessary for completion of procedure in a timely manner, assisting with retraction, instrumentation, and closure)     ANESTHESIA: General  ESTIMATED BLOOD LOSS: 163WG    COMPLICATIONS:  None.     UNIQUE ASPECTS OF THE CASE: Comminuted periprosthetic left femur fracture.  Excellent anatomic reduction of the femoral shaft, with cerclage cables.  Good distal fixation with the modular stem.  Excellent stability with the revision components.  Good stability of the greater trochanter with a  trochanteric plate and 2 cables.  COMPONENTS:   Stryker restoration modular 195 mm x 17 mm distal stem.  Proximal body restoration modular 23 mm +0 mm.  36+2.5 mm femoral head.  Medium trochanteric grip plate with 2 cables.  5 additional 2.0 Dall-Miles cables. Implant Name Type Inv. Item Serial No. Manufacturer Lot No. LRB No. Used Action  SLEEVE CABLE 2MM VT - [id number] Orthopedic Implant SLEEVE CABLE 2MM VT  [employer] [id number] Left 6 Implanted  RESTORATION MODULAR HIP SYSTEM [id number] Orthopedic Implant    [id number] Left 1 Implanted  REST MOD HIP SYS SZ23 +0 V40 - [id number] Hips REST MOD HIP SYS SZ23 +0 V40  [employer] [id number] Left 1 Implanted  HIP BIOLOX HD 36/+2.5 - [id number] Joint HIP BIOLOX HD 36/+2.5  [employer] [id number] Left 1 Implanted  PLATE TROCH GRIP W/2 CABLES - [id number] Plate PLATE TROCH GRIP W/2 CABLES  [employer] [id number] Left 1 Implanted      PROCEDURE IN DETAIL:   The patient was met in the holding area and  identified.  The appropriate hip was identified and marked at the operative site.  The patient was then transported to the OR  and  placed under anesthesia.  At that point, the patient was  placed in the lateral decubitus position with the operative side up and  secured to the operating room table  and all bony prominences padded. A subaxillary role was also placed.    The operative lower extremity was prepped from the iliac crest to the distal leg.  Sterile draping was performed.  Preoperative antibiotics, 2 gm of anef,1 gm of Tranexamic Acid administered. Time out was performed prior to incision.      A posterolateral approach was utilized via sharp dissection, this was  extended distally along the lateral thigh.  Carried down to the subcutaneous tissue.  Gross bleeders were Bovie coagulated.  The iliotibial band was identified and incised along the length of the skin incision through the glute max fascia.  Charnley retractor was  placed with care to protect the sciatic nerve posteriorly.  With the hip internally rotated, the piriformis tendon was identified and released from the femoral insertion and tagged with a #5 Ethibond.  A capsulotomy was then performed off the femoral insertion and also tagged with a #5 Ethibond.    The greater trochanter piece was free and able to be mobilized anteriorly.  The femoral component was able to be removed with minimal difficulty laterally out of the femur.  Next we turned our attention to exposing the distal fracture.  A subvastus approach leaving a 1 cm cuff of vastus lateralis posteriorly was performed to expose the left femoral cortex and fracture.  Fracture hematoma was irrigated and debrided to allow for good visualization of the fracture fragments.  Once the distal femur was identified and cleaned off, 1 Dall-Miles cable was passed circumferentially around the distal femur, about 1 cm distal to the distalmost extent of the fracture to prevent distal propagation of the fracture.  Next, the proximal medial fragment was mobilized.  This fragment was reduced to the distal segment and held with a reduction clamp.  2 Dall-Miles cables were then placed circumferentially around the fracture at this point in secured.  The fracture was able to be visualized and anatomically reduced.  Next a thin but long lateral cortical piece was mobilized and reduced to the medial proximal femur.  This was also secured with 2 Dall-Miles cables which were tightened and secured.  Elected to leave the greater trochanter fragment off at this point to allow for better visualization and reaming of the femur.  The femur was able to be safely flexed and internally rotated to allow for exposure to the canal for preparation of the femoral implant.  Felt that we had excellent stability with the cerclage cable fixation.  We started reaming for the distal stem with a straight 13 mm reamer.  We reamed up by 1 mm at a time.  Started  to get some cortical purchase with a 16 mm reamer.  Fluoroscopy was then obtained to ensure appropriate depth and position of the reamer.  Fluoroscopic images showed that we were in excellent position on the AP.  However on the lateral we were starting to approach the anterior cortex.  Also felt that we could send the reamer down further distally to get better distal fixation across the fracture.  I went back to reaming with a 15 mm reamer aiming more posteriorly as well as distally approximately additional 20 mm.  We then confirmed again with a 60 mm measurement reamer appropriate depth and position on both the AP and lateral x-rays.  Elected to ream up then to a 17 mm reamer for better canal fill and cortical fit.  Given the depth at this point needed the longer stem length.  The 17 x 195 mm distal body was opened and impacted.  It had excellent rotational and axial stability, and the depth match to the level at which we had reamed.  We next turned our attention to reaming for the proximal body.  We reamed up to the 23 mm proximal body which had 40 mm of offset matching the size 5 Accolade two 132 degree stem which also has 40 mm of  offset.  We then placed a trial proximal body and about 25 degrees of anteversion.  The cup was assessed and determined to be in excellent position with no damage to the polyethylene liner.  We first trialed a 36+0 femoral head ball.  The hip was carefully reduced.  We felt that the hip was slightly short.  Had a little bit of impingement with full extension at 90 degrees of external rotation.  The hip was stable at the position of sleep and with 90 degrees flexion and 80 degrees of internal rotation.   To improve the stability and length we then trialed a 36+2.5 femoral head ball. There was no impingement with full extension and 90 degrees external rotation.  The hip was stable at the position of sleep and with 90 degrees flexion and 90 degrees of internal rotation.  Leg lengths  were also clinically assessed in the lateral position and felt to be equal.  Fluoroscopy confirmed appropriate position of components and restoration of leg length and offset.    The hip was then carefully dislocated and the trial ball and proximal body were removed.  The 23+0 proximal body was then opened and inserted.  It was secured with a locking bolt and tension to just over 150 foot pounds.   We then opened, and I impacted the real head ball 36 +2.17mm into place. The hip was then reduced and taken through a range of motion. There was no impingement with full extension and 90 degrees external rotation.  The hip was stable at the position of sleep and with 90 degrees flexion and 80 degrees of internal rotation. Leg lengths were  again assessed and felt to be restored.  Final fluoroscopic images again confirmed excellent position of components, restoration of leg length, and offset.  Next we turned our attention to fixation of the greater trochanter fragment.  First attempted to fix the trochanter fragment with just cables alone given that it had both insertions of the abductors and vastus lateralis still attached.  However given the comminution through the intertrochanteric region were unable to get good fixation with just the cable alone.  Elected to utilize a medium trochanter plate which shows position posterior lateral over the greater trochanter and just under the insertion of the vastus lateralis.  2 cables were placed under the vastus lateralis and under the lesser trochanter to hold the hook plate in position.  With the cable secured felt we had excellent stability of the greater trochanter fragment, and a reasonable reduction despite the comminution.  The posterior capsule was then closed with #5 Ethibond.   I then irrigated the hip copiously with dilute Betadine and with normal saline pulse lavage. Periarticular injection was then performed with Exparel.   The vastus lateralis was repaired with  a running 0 Vicryl.  We repaired the fascia #1 barbed suture, followed by 0 barbed suture for the subcutaneous fat.  Skin was closed with 2-0 Vicryl and staples.  Dermabond and Aquacel dressing were applied. The patient was then awakened and returned to PACU in stable and satisfactory condition.  Leg lengths in the supine position were assessed and felt to be clinically equal. There were no complications.  Post op recs: WB: Touchdown weightbearing left lower extremity x6 weeks with posterior hip precautions  Abx: Ancef 2 g every 8 in house or until at least postop day 3, plan for discharge on 7 days cefadroxil 500 twice daily Imaging: PACU pelvis Xray Dressing: Aquacell, keep intact until follow  up DVT prophylaxis: Aspirin 81BID starting POD1 Follow up: 2 weeks after surgery for a wound check with Dr. Zachery Dakins at [facility].  Address: [address], [place], [place] [address]  Office Phone: [phone]   Charlies Constable, MD Orthopedic Surgeon

## 2021-11-28 NOTE — Evaluation (Signed)
Physical Therapy Evaluation Patient Details Name: Mary [name] MRN: [id number] DOB: 1945/07/09 Today's Date: 11/28/2021  History of Present Illness  Pt is a 76yo female  who had  L-THA, AA on 11/22/21, fell in bathroom resulting in L hip fx/periprosthetic fx.(Imaging shows a left hip comminuted nondisplaced fracture in the region of the  greater trochanter. There is also an oblique displaced fracture extending from the level of the greater trochanter to proximally 2 cm distal to the tip of the femoral prosthesis. This fracture is displaced distal to the level of the prosthesis with apex lateral angulation). orhto consulted and pt underwent L  hip ORIF + femoral component  revision on 11/28/21.  PMH: Anemia, brochiectasis, COPD, GERD, anterior cervical fusion 1992.  Clinical Impression  Pt admitted with above diagnosis.  Initiated mobility today, pt unable to come to complete sitting position EOB d/t pain. Pt husband is unable to physically assist per pt son, has 3 supportive sons that live locally.  Feel pt will likely need SNF post acute. Will continue efforts to mobilize   Pt currently with functional limitations due to the deficits listed below (see PT Problem List). Pt will benefit from skilled PT to increase their independence and safety with mobility to allow discharge to the venue listed below.          Recommendations for follow up therapy are one component of a multi-disciplinary discharge planning process, led by the attending physician.  Recommendations may be updated based on patient status, additional functional criteria and insurance authorization.  Follow Up Recommendations Skilled nursing-short term rehab (<3 hours/day) Can patient physically be transported by private vehicle: No    Assistance Recommended at Discharge Frequent or constant Supervision/Assistance  Patient can return home with the following  A little help with walking and/or transfers;Assistance with  cooking/housework;Assist for transportation;Help with stairs or ramp for entrance;A lot of help with bathing/dressing/bathroom    Equipment Recommendations None recommended by PT  Recommendations for Other Services       Functional Status Assessment Patient has had a recent decline in their functional status and demonstrates the ability to make significant improvements in function in a reasonable and predictable amount of time.     Precautions / Restrictions Precautions Precautions: Posterior Hip Restrictions Weight Bearing Restrictions: Yes LLE Weight Bearing: Partial weight bearing LLE Partial Weight Bearing Percentage or Pounds: not inorder but per son (Dr Judie Petit) said 50%      Mobility  Bed Mobility Overal bed mobility: Needs Assistance Bed Mobility: Supine to Sit, Sit to Supine     Supine to sit: Max assist Sit to supine: Mod assist, Max assist   General bed mobility comments: incr time, assist with LEs, unable to bring trunk to full upright d/t R hip pain    Transfers                   General transfer comment: deferred/unable    Ambulation/Gait                  Stairs            Wheelchair Mobility    Modified Rankin (Stroke Patients Only)       Balance                                             Pertinent Vitals/Pain Pain Assessment Pain  Assessment: Faces Faces Pain Scale: Hurts whole lot Pain Location: left hip Pain Descriptors / Indicators: Operative site guarding, Grimacing, Sore Pain Intervention(s): Limited activity within patient's tolerance, Monitored during session, Repositioned    Home Living Family/patient expects to be discharged to:: Unsure Living Arrangements: Spouse/significant other Available Help at Discharge: Family;Available 24 hours/day Type of Home: House Home Access: Stairs to enter Entrance Stairs-Rails: Right;Left;Can reach both Entrance Stairs-Number of Steps: 5     Home Equipment:  Agricultural consultant (2 wheels);Cane - single point      Prior Function Prior Level of Function : Independent/Modified Independent             Mobility Comments: amb with RW since DA THA ~4 days ago       Hand Dominance        Extremity/Trunk Assessment   Upper Extremity Assessment Upper Extremity Assessment: Overall WFL for tasks assessed    Lower Extremity Assessment Lower Extremity Assessment: LLE deficits/detail LLE: Unable to fully assess due to pain       Communication   Communication: No difficulties  Cognition Arousal/Alertness: Awake/alert Behavior During Therapy: WFL for tasks assessed/performed Overall Cognitive Status: Within Functional Limits for tasks assessed                                          General Comments      Exercises     Assessment/Plan    PT Assessment Patient needs continued PT services  PT Problem List Decreased strength;Decreased range of motion;Decreased activity tolerance;Decreased balance;Decreased mobility;Decreased coordination;Pain       PT Treatment Interventions DME instruction;Gait training;Stair training;Functional mobility training;Therapeutic activities;Therapeutic exercise;Balance training;Neuromuscular re-education;Patient/family education    PT Goals (Current goals can be found in the Care Plan section)  Acute Rehab PT Goals Patient Stated Goal: Walk without pain PT Goal Formulation: With patient/family Time For Goal Achievement: 12/12/21 Potential to Achieve Goals: Good    Frequency Min 4X/week     Co-evaluation               AM-PAC PT "6 Clicks" Mobility  Outcome Measure Help needed turning from your back to your side while in a flat bed without using bedrails?: A Lot Help needed moving from lying on your back to sitting on the side of a flat bed without using bedrails?: A Lot Help needed moving to and from a bed to a chair (including a wheelchair)?: Total Help needed standing up  from a chair using your arms (e.g., wheelchair or bedside chair)?: Total Help needed to walk in hospital room?: Total Help needed climbing 3-5 steps with a railing? : Total 6 Click Score: 8    End of Session   Activity Tolerance: Patient limited by fatigue;Patient limited by pain Patient left: in bed;with call bell/phone within reach;with bed alarm set;with family/visitor present   Pain - Right/Left: Left Pain - part of body: Hip    Time: 8841-6606 PT Time Calculation (min) (ACUTE ONLY): 27 min   Charges:   PT Evaluation $PT Eval Low Complexity: 1 Low PT Treatments $Therapeutic Activity: 8-22 mins        Delice Bison, PT  Acute Rehab Dept [facility]) [phone]  [facility] Weekend Pager (Saturday/Sunday only)  [phone]  11/28/2021   [facility] 11/28/2021, 6:08 PM

## 2021-11-28 NOTE — Anesthesia Postprocedure Evaluation (Signed)
Anesthesia Post Note  Patient: Mary [name]  Procedure(s) Performed: TOTAL HIP REVISION (Left: Hip)     Patient location during evaluation: PACU Anesthesia Type: General Level of consciousness: awake and alert, oriented and patient cooperative Pain management: pain level controlled Vital Signs Assessment: post-procedure vital signs reviewed and stable Respiratory status: spontaneous breathing, nonlabored ventilation and respiratory function stable Cardiovascular status: blood pressure returned to baseline and stable Postop Assessment: no apparent nausea or vomiting Anesthetic complications: no   No notable events documented.  Last Vitals:  Vitals:   11/28/21 1406 11/28/21 1415  BP: (!) 147/64 139/67  Pulse: 68 84  Resp: 15 15  Temp: (!) 36.1 C   SpO2: 100% 100%    Last Pain:  Vitals:   11/28/21 1415  TempSrc:   PainSc: 0-No pain                 Lannie Fields

## 2021-11-29 ENCOUNTER — Encounter (HOSPITAL_COMMUNITY): Payer: Self-pay | Admitting: Orthopedic Surgery

## 2021-11-29 DIAGNOSIS — J479 Bronchiectasis, uncomplicated: Secondary | ICD-10-CM | POA: Diagnosis not present

## 2021-11-29 DIAGNOSIS — W19XXXA Unspecified fall, initial encounter: Secondary | ICD-10-CM | POA: Diagnosis not present

## 2021-11-29 DIAGNOSIS — S7292XS Unspecified fracture of left femur, sequela: Secondary | ICD-10-CM | POA: Diagnosis not present

## 2021-11-29 DIAGNOSIS — S72355A Nondisplaced comminuted fracture of shaft of left femur, initial encounter for closed fracture: Secondary | ICD-10-CM

## 2021-11-29 DIAGNOSIS — Y92009 Unspecified place in unspecified non-institutional (private) residence as the place of occurrence of the external cause: Secondary | ICD-10-CM | POA: Diagnosis not present

## 2021-11-29 LAB — BASIC METABOLIC PANEL
Anion gap: 5 (ref 5–15)
BUN: 26 mg/dL — ABNORMAL HIGH (ref 8–23)
CO2: 24 mmol/L (ref 22–32)
Calcium: 7.6 mg/dL — ABNORMAL LOW (ref 8.9–10.3)
Chloride: 102 mmol/L (ref 98–111)
Creatinine, Ser: 0.58 mg/dL (ref 0.44–1.00)
GFR, Estimated: >60 mL/min (ref >60)
Glucose, Bld: 126 mg/dL — ABNORMAL HIGH (ref 70–99)
Potassium: 5 mmol/L (ref 3.5–5.1)
Sodium: 131 mmol/L — ABNORMAL LOW (ref 135–145)

## 2021-11-29 LAB — CBC
HCT: 24.6 % — ABNORMAL LOW (ref 36.0–46.0)
Hemoglobin: 7.7 g/dL — ABNORMAL LOW (ref 12.0–15.0)
MCH: 30.1 pg (ref 26.0–34.0)
MCHC: 31.3 g/dL (ref 30.0–36.0)
MCV: 96.1 fL (ref 80.0–100.0)
Platelets: 275 10*3/uL (ref 150–400)
RBC: 2.56 MIL/uL — ABNORMAL LOW (ref 3.87–5.11)
RDW: 13.6 % (ref 11.5–15.5)
WBC: 13.3 10*3/uL — ABNORMAL HIGH (ref 4.0–10.5)
nRBC: 0 % (ref 0.0–0.2)

## 2021-11-29 MED ORDER — SODIUM CHLORIDE 0.9 % IV SOLN: 1.0000 g | INTRAVENOUS | Status: DC

## 2021-11-29 MED ORDER — PANTOPRAZOLE SODIUM 40 MG PO TBEC
40.0000 mg | DELAYED_RELEASE_TABLET | Freq: Every day | ORAL | Status: DC
  Administered 2021-11-29 – 2021-12-01 (×3): 40 mg via ORAL
  Filled 2021-11-29 (×3): qty 1

## 2021-11-29 NOTE — Progress Notes (Addendum)
Physical Therapy Treatment Patient Details Name: Mary [name] MRN: [id number] DOB: Jul 10, 1945 Today's Date: 11/29/2021   History of Present Illness Pt is a 76yo female  who had  L-THA, AA on 11/22/21, fell in bathroom resulting in L hip fx/periprosthetic fx.(Imaging shows a left hip comminuted nondisplaced fracture in the region of the  greater trochanter. There is also an oblique displaced fracture extending from the level of the greater trochanter to proximally 2 cm distal to the tip of the femoral prosthesis. This fracture is displaced distal to the level of the prosthesis with apex lateral angulation). orhto consulted and pt underwent L  hip ORIF + femoral component  revision on 11/28/21.  PMH: Anemia, brochiectasis, COPD, GERD, anterior cervical fusion 1992.    PT Comments    POD # 2 Open reduction internal fixation left periprosthetic femur fracture with cerclage cabling, revision femoral component, ORIF of greater trochanter fracture with trochanter plate. Am session Pt was premedicated General Comments: AxO x3 very pleasant lady but in a lot pain General bed mobility comments: +2 assist with care and increased time to transition from supine to EOB.  Pre medicated 30 min prior.  Still 8/10 hip pain. General transfer comment: + 2 side by side assist from elevated bed to [facility] then from [facility] onto EVA walker to attempt gait. General Gait Details: + 2 Max Assist with EVA walker pt was able to progress gait approx 5 feet with much effort and recliner following for safety.  Distance limited by pain/effort/fatigue. Positioned in recliner and applied 3 ICE packs L Hip/thigh/knee Will see pt again this afternoon to assist back to bed.  Pt will need ST Rehab at SNF to address mobility and functional decline prior to safely returning home.    Recommendations for follow up therapy are one component of a multi-disciplinary discharge planning process, led by the attending physician.  Recommendations  may be updated based on patient status, additional functional criteria and insurance authorization.  Follow Up Recommendations  Skilled nursing-short term rehab (<3 hours/day) Can patient physically be transported by private vehicle: No   Assistance Recommended at Discharge Frequent or constant Supervision/Assistance  Patient can return home with the following A little help with walking and/or transfers;Assistance with cooking/housework;Assist for transportation;Help with stairs or ramp for entrance;A lot of help with bathing/dressing/bathroom   Equipment Recommendations  None recommended by PT    Recommendations for Other Services       Precautions / Restrictions Precautions Precautions: Posterior Hip Restrictions Weight Bearing Restrictions: Yes LLE Weight Bearing: Partial weight bearing LLE Partial Weight Bearing Percentage or Pounds: 50%     Mobility  Bed Mobility Overal bed mobility: Needs Assistance Bed Mobility: Supine to Sit     Supine to sit: Max assist     General bed mobility comments: +2 assist with care and increased time to transition from supine to EOB.  Pre medicated 30 min prior.  Still 8/10 hip pain.    Transfers Overall transfer level: Needs assistance Equipment used: Rolling walker (2 wheels) Transfers: Sit to/from Stand Sit to Stand: Max assist, +2 physical assistance, +2 safety/equipment, From elevated surface           General transfer comment: + 2 side by side assist from elevated bed to [facility] then from [facility] onto EVA walker to attempt gait.    Ambulation/Gait Ambulation/Gait assistance: Max assist, +2 physical assistance, +2 safety/equipment Gait Distance (Feet): 5 Feet Assistive device: Bilateral platform walker Gait Pattern/deviations: Step-to pattern, Decreased stance time -  left, Trunk flexed, Decreased step length - left, Decreased step length - right Gait velocity: decreased     General Gait Details: + 2 Max Assist with EVA walker pt  was able to progress gait approx 5 feet with much effort and recliner following for safety.  Distance limited by pain/effort/fatigue.   Stairs             Wheelchair Mobility    Modified Rankin (Stroke Patients Only)       Balance                                            Cognition Arousal/Alertness: Awake/alert Behavior During Therapy: WFL for tasks assessed/performed Overall Cognitive Status: Within Functional Limits for tasks assessed                                 General Comments: AxO x3 very pleasant lady but in a lot pain        Exercises      General Comments        Pertinent Vitals/Pain Pain Assessment Pain Assessment: 0-10 Pain Score: 8  Pain Location: left hip Pain Descriptors / Indicators: Operative site guarding, Grimacing, Sore Pain Intervention(s): Monitored during session, Premedicated before session, Repositioned, Ice applied    Home Living                          Prior Function            PT Goals (current goals can now be found in the care plan section) Progress towards PT goals: Progressing toward goals    Frequency    Min 4X/week      PT Plan Current plan remains appropriate    Co-evaluation              AM-PAC PT "6 Clicks" Mobility   Outcome Measure  Help needed turning from your back to your side while in a flat bed without using bedrails?: A Lot Help needed moving from lying on your back to sitting on the side of a flat bed without using bedrails?: A Lot Help needed moving to and from a bed to a chair (including a wheelchair)?: A Lot Help needed standing up from a chair using your arms (e.g., wheelchair or bedside chair)?: A Lot Help needed to walk in hospital room?: A Lot Help needed climbing 3-5 steps with a railing? : Total 6 Click Score: 11    End of Session Equipment Utilized During Treatment: Gait belt Activity Tolerance: Patient limited by  fatigue;Patient limited by pain Patient left: in chair;with call bell/phone within reach;with chair alarm set;with family/visitor present Nurse Communication: Mobility status PT Visit Diagnosis: Pain;Difficulty in walking, not elsewhere classified (R26.2) Pain - Right/Left: Left Pain - part of body: Hip     Time:10:42 - 11:24am    Charges:  $Gait Training: 8-22 mins $Therapeutic Activity: 23 - 38 mins                     {Teletha Petrea  PTA Acute  Rehabilitation Services Office M-F          [phone] Weekend pager [phone]

## 2021-11-29 NOTE — Progress Notes (Signed)
Physical Therapy Treatment Patient Details Name: Mary [name] MRN: [id number] DOB: May 16, 1945 Today's Date: 11/29/2021   History of Present Illness Pt is a 76yo female  who had  L-THA, AA on 11/22/21, fell in bathroom resulting in L hip fx/periprosthetic fx.(Imaging shows a left hip comminuted nondisplaced fracture in the region of the  greater trochanter. There is also an oblique displaced fracture extending from the level of the greater trochanter to proximally 2 cm distal to the tip of the femoral prosthesis. This fracture is displaced distal to the level of the prosthesis with apex lateral angulation). orhto consulted and pt underwent L  hip ORIF + femoral component  revision on 11/28/21.  PMH: Anemia, brochiectasis, COPD, GERD, anterior cervical fusion 1992.    PT Comments    POD # 2 Open reduction internal fixation left periprosthetic femur fracture with cerclage cabling, revision femoral component, ORIF of greater trochanter fracture with trochanter plate. PM session Pt tolerated OOB in recliner approx 2.5 hours.  Pre medicated, assisted with a short distance amb 4 feet back to bed.  General Gait Details: + 2 Max Assist with EVA walker pt was able to progress gait approx 4 feet with much effort and recliner following for safety.  Distance limited by pain/effort/fatigue.  General bed mobility comments: Total Asisst + 2 back to bed then positioned to comfort and applied ICE.  Pt will need ST Rehab at SNF to address mobility and functional decline prior to safely returning home.   Recommendations for follow up therapy are one component of a multi-disciplinary discharge planning process, led by the attending physician.  Recommendations may be updated based on patient status, additional functional criteria and insurance authorization.  Follow Up Recommendations  Skilled nursing-short term rehab (<3 hours/day) Can patient physically be transported by private vehicle: No   Assistance  Recommended at Discharge Frequent or constant Supervision/Assistance  Patient can return home with the following A little help with walking and/or transfers;Assistance with cooking/housework;Assist for transportation;Help with stairs or ramp for entrance;A lot of help with bathing/dressing/bathroom   Equipment Recommendations  None recommended by PT    Recommendations for Other Services       Precautions / Restrictions Precautions Precautions: Posterior Hip Restrictions Weight Bearing Restrictions: Yes LLE Weight Bearing: Partial weight bearing LLE Partial Weight Bearing Percentage or Pounds: 50%     Mobility  Bed Mobility Overal bed mobility: Needs Assistance Bed Mobility: Sit to Supine     Supine to sit: Max assist Sit to supine: Total assist, +2 for physical assistance, +2 for safety/equipment   General bed mobility comments: Total Asisst + 2 back to bed then positioned to comfort and applied ICE.    Transfers Overall transfer level: Needs assistance Equipment used: 2 person hand held assist Transfers: Sit to/from Stand Sit to Stand: Max assist, +2 physical assistance, +2 safety/equipment, From elevated surface           General transfer comment: + 2 side by side assist from recliner onto B Platform EVA walker.    Ambulation/Gait Ambulation/Gait assistance: Max assist, +2 physical assistance, +2 safety/equipment Gait Distance (Feet): 4 Feet Assistive device: Bilateral platform walker Gait Pattern/deviations: Step-to pattern, Decreased stance time - left, Trunk flexed, Decreased step length - left, Decreased step length - right Gait velocity: decreased     General Gait Details: + 2 Max Assist with EVA walker pt was able to progress gait approx 4 feet with much effort and recliner following for safety.  Distance limited by  pain/effort/fatigue.   Stairs             Wheelchair Mobility    Modified Rankin (Stroke Patients Only)       Balance                                             Cognition Arousal/Alertness: Awake/alert Behavior During Therapy: WFL for tasks assessed/performed Overall Cognitive Status: Within Functional Limits for tasks assessed                                 General Comments: AxO x3 very pleasant lady but in a lot pain        Exercises      General Comments        Pertinent Vitals/Pain Pain Assessment Pain Assessment: 0-10 Pain Score: 8  Pain Location: left hip Pain Descriptors / Indicators: Operative site guarding, Grimacing, Sore Pain Intervention(s): Monitored during session, Premedicated before session, Repositioned, Ice applied    Home Living                          Prior Function            PT Goals (current goals can now be found in the care plan section) Progress towards PT goals: Progressing toward goals    Frequency    Min 4X/week      PT Plan Current plan remains appropriate    Co-evaluation              AM-PAC PT "6 Clicks" Mobility   Outcome Measure  Help needed turning from your back to your side while in a flat bed without using bedrails?: A Lot Help needed moving from lying on your back to sitting on the side of a flat bed without using bedrails?: A Lot Help needed moving to and from a bed to a chair (including a wheelchair)?: A Lot Help needed standing up from a chair using your arms (e.g., wheelchair or bedside chair)?: A Lot Help needed to walk in hospital room?: A Lot Help needed climbing 3-5 steps with a railing? : Total 6 Click Score: 11    End of Session Equipment Utilized During Treatment: Gait belt Activity Tolerance: Patient limited by fatigue;Patient limited by pain Patient left: in bed Nurse Communication: Mobility status PT Visit Diagnosis: Pain;Difficulty in walking, not elsewhere classified (R26.2) Pain - Right/Left: Left Pain - part of body: Hip     Time: 8099-8338 PT Time Calculation  (min) (ACUTE ONLY): 25 min  Charges:  $Gait Training: 8-22 mins $Therapeutic Activity: 8-22 mins                     Felecia Shelling  PTA Acute  Rehabilitation Services Office M-F          [phone] Weekend pager [phone]

## 2021-11-29 NOTE — NC FL2 (Signed)
[place] LEVEL OF CARE SCREENING TOOL     IDENTIFICATION  Patient Name: Mary [name] Birthdate: 14-Nov-1945 Sex: female Admission Date (Current Location): 11/26/2021  [facility] and [place] Number:  Herbalist and Address:  [facility]  [place], [place]      Provider Number: [id number]  Attending Physician Name and Address:  Geradine Girt, DO  Relative Name and Phone Number:  Spouse, Tommie Kouns    Current Level of Care: Hospital Recommended Level of Care: [place] Prior Approval Number:    Date Approved/Denied:   PASRR Number: [id number]  Discharge Plan: SNF    Current Diagnoses: Patient Active Problem List   Diagnosis Date Noted   Closed left femoral fracture ([place]) 11/26/2021   Fall at home, initial encounter 11/26/2021   Hyponatremia 11/26/2021   S/P total left hip arthroplasty 11/22/2021   Peripheral sensory neuropathy 02/18/2021   GERD (gastroesophageal reflux disease) 02/18/2021   Bilateral primary osteoarthritis of hip 04/24/2018   Zenker's diverticulum 08/01/2013   Allergic rhinitis due to pollen 06/13/2010   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[place]) 12/06/2006   Bronchiectasis ([place]) 12/06/2006   Osteoporosis 12/06/2006    Orientation RESPIRATION BLADDER Height & Weight     Self, Time, Situation, Place  Normal Incontinent, External catheter Weight: 111 lb 5.3 oz (50.5 kg) Height:  _0  (162.6 cm)  BEHAVIORAL SYMPTOMS/MOOD NEUROLOGICAL BOWEL NUTRITION STATUS      Continent Diet (Regular)  AMBULATORY STATUS COMMUNICATION OF NEEDS Skin   Extensive Assist Verbally Normal                       Personal Care Assistance Level of Assistance  Bathing, Dressing, Feeding Bathing Assistance: Limited assistance Feeding assistance: Independent Dressing Assistance: Limited assistance     Functional Limitations Info  Sight, Hearing, Speech Sight Info:  Adequate Hearing Info: Adequate Speech Info: Adequate    SPECIAL CARE FACTORS FREQUENCY  PT (By licensed PT), OT (By licensed OT)     PT Frequency: 5x/wk OT Frequency: 5x/wk            Contractures Contractures Info: Not present    Additional Factors Info  Code Status, Allergies Code Status Info: FULL Allergies Info: Levaquin (Levofloxacin), Rifadin (Rifampin), Bactrim (Sulfamethoxazole-trimethoprim), Vibramycin (Doxycycline)           Current Medications (11/29/2021):  This is the current hospital active medication list Current Facility-Administered Medications  Medication Dose Route Frequency Provider Last Rate Last Admin   0.9 %  sodium chloride infusion (Manually program via Guardrails IV Fluids)   Intravenous Once Willaim Sheng, MD       0.9 %  sodium chloride infusion (Manually program via Guardrails IV Fluids)   Intravenous Once Willaim Sheng, MD       0.9 %  sodium chloride infusion   Intravenous Continuous Willaim Sheng, MD   Stopped at 11/28/21 [phone]   acetaminophen (TYLENOL) tablet 650 mg  650 mg Oral Q6H PRN Willaim Sheng, MD   650 mg at 11/26/21 2219   Or   acetaminophen (TYLENOL) suppository 650 mg  650 mg Rectal Q6H PRN Willaim Sheng, MD       acetaminophen (TYLENOL) tablet 500 mg  500 mg Oral Q8H Willaim Sheng, MD   500 mg at 11/28/21 2100   albuterol (VENTOLIN HFA) 108 (90 Base) MCG/ACT inhaler 2 puff  2 puff Inhalation Q6H PRN Marchwiany,  Virgina Norfolk, MD       aspirin EC tablet 81 mg  81 mg Oral BID Willaim Sheng, MD   81 mg at 11/29/21 1014   ceFAZolin (ANCEF) IVPB 2g/100 mL premix  2 g Intravenous Q8H Adrian Saran,    Stopped at 11/29/21 1102   gabapentin (NEURONTIN) capsule 100 mg  100 mg Oral QHS Willaim Sheng, MD   100 mg at 11/28/21 2100   HYDROcodone-acetaminophen (NORCO/VICODIN) 5-325 MG per tablet 1-2 tablet  1-2 tablet Oral Q4H PRN Willaim Sheng, MD   2 tablet at 11/29/21 1117    HYDROmorphone (DILAUDID) injection 0.5 mg  0.5 mg Intravenous Q4H PRN Willaim Sheng, MD   0.5 mg at 11/27/21 1949   methocarbamol (ROBAXIN) tablet 750 mg  750 mg Oral Q8H PRN Willaim Sheng, MD   750 mg at 11/28/21 2053   ondansetron (ZOFRAN) tablet 4 mg  4 mg Oral Q6H PRN Willaim Sheng, MD       Or   ondansetron [facility]) injection 4 mg  4 mg Intravenous Q6H PRN Willaim Sheng, MD   4 mg at 11/27/21 1752   Oral care mouth rinse  15 mL Mouth Rinse PRN Eulogio Bear U, DO       oxyCODONE (Oxy IR/ROXICODONE) immediate release tablet 5-10 mg  5-10 mg Oral Q6H PRN Willaim Sheng, MD   10 mg at 11/29/21 1014   pantoprazole (PROTONIX) injection 40 mg  40 mg Intravenous Q24H Willaim Sheng, MD   40 mg at 11/28/21 2100   polyvinyl alcohol (LIQUIFILM TEARS) 1.4 % ophthalmic solution 1 drop  1 drop Both Eyes TID PRN Willaim Sheng, MD       sodium chloride HYPERTONIC 3 % nebulizer solution 4 mL  4 mL Nebulization Daily Willaim Sheng, MD   4 mL at 11/29/21 0752   traMADol (ULTRAM) tablet 50 mg  50 mg Oral Q6H Willaim Sheng, MD   50 mg at 11/28/21 2309     Discharge Medications: Please see discharge summary for a list of discharge medications.  Relevant Imaging Results:  Relevant Lab Results:   Additional Information SSN: [id number]  Vassie Moselle, LCSW

## 2021-11-29 NOTE — TOC Initial Note (Signed)
Transition of Care [facility]) - Initial/Assessment Note    Patient Details  Name: Mary [name] MRN: [id number] Date of Birth: [birth year]  [facility]) CM/SW Contact:    Vassie Moselle, LCSW Phone Number: 11/29/2021, 10:33 AM  Clinical Narrative:                 Met with pt and son at bedside to discuss recommendations for SNF. Pt states she has never been to SNF before however, her mother had been to [facility] in [place] in the past. Pt is hesitant about SNF placement and prefers to go home with home health services. CSW reviewed recommendations and reasoning for SNF vs HH. Pt is agreeable to being referred for SNF however, would like to revisit SNF vs HH closer to discharge pending her progression and continued work with PT while at the hospital.  [facility] faxed referral out for SNF placement and currently awaiting bed offers.   Expected Discharge Plan: Skilled Nursing Facility Barriers to Discharge: Continued Medical Work up   Patient Goals and CMS Choice Patient states their goals for this hospitalization and ongoing recovery are:: "To return home"   Choice offered to / list presented to : Patient  Expected Discharge Plan and Services Expected Discharge Plan: [place] In-house Referral: Clinical Social Work Discharge Planning Services: CM Consult Post Acute Care Choice: [place] arrangements for the past 2 months: Single Family Home                 DME Arranged: N/A DME Agency: NA                  Prior Living Arrangements/Services Living arrangements for the past 2 months: Single Family Home Lives with:: Spouse Patient language and need for interpreter reviewed:: Yes Do you feel safe going back to the place where you live?: Yes      Need for Family Participation in Patient Care: No (Comment) Care giver support system in place?: No (comment) Current home services: DME Criminal Activity/Legal Involvement Pertinent to Current  Situation/Hospitalization: No - Comment as needed  Activities of Daily Living Home Assistive Devices/Equipment: Contact lenses ADL Screening (condition at time of admission) Patient's cognitive ability adequate to safely complete daily activities?: Yes Is the patient deaf or have difficulty hearing?: No Does the patient have difficulty seeing, even when wearing glasses/contacts?: No Does the patient have difficulty concentrating, remembering, or making decisions?: No Patient able to express need for assistance with ADLs?: Yes Does the patient have difficulty dressing or bathing?: Yes Independently performs ADLs?: No Communication: Independent Dressing (OT): Needs assistance Is this a change from baseline?: Change from baseline, expected to last <3days Grooming: Needs assistance Is this a change from baseline?: Change from baseline, expected to last <3 days Feeding: Independent with device (comment) (needs setup) Bathing: Needs assistance Is this a change from baseline?: Change from baseline, expected to last <3 days Toileting: Needs assistance Is this a change from baseline?: Change from baseline, expected to last <3 days In/Out Bed: Needs assistance Is this a change from baseline?: Change from baseline, expected to last <3 days Walks in Home: Needs assistance Is this a change from baseline?: Change from baseline, expected to last <3 days Does the patient have difficulty walking or climbing stairs?: Yes Weakness of Legs: Left Weakness of Arms/Hands: None  Permission Sought/Granted Permission sought to share information with : Facility Sport and exercise psychologist, Family Supports Permission granted to share information with : Yes, Verbal  Permission Granted  Share Information with NAME: Tommie Arquette     Permission granted to share info w Relationship: Spouse  Permission granted to share info w Contact Information: [phone]  Emotional Assessment Appearance:: Appears stated  age Attitude/Demeanor/Rapport: Engaged, Apprehensive Affect (typically observed): Apprehensive, Pleasant Orientation: : Oriented to Self, Oriented to Place, Oriented to  Time, Oriented to Situation Alcohol / Substance Use: Not Applicable Psych Involvement: No (comment)  Admission diagnosis:  Fracture of femoral neck, left, closed ([place]) [S72.002A] Closed left femoral fracture (HCC) [S72.92XA] Closed displaced comminuted fracture of shaft of left femur, initial encounter ([place]) [S72.352A] Patient Active Problem List   Diagnosis Date Noted   Closed left femoral fracture ([place]) 11/26/2021   Fall at home, initial encounter 11/26/2021   Hyponatremia 11/26/2021   S/P total left hip arthroplasty 11/22/2021   Peripheral sensory neuropathy 02/18/2021   GERD (gastroesophageal reflux disease) 02/18/2021   Bilateral primary osteoarthritis of hip 04/24/2018   Zenker's diverticulum 08/01/2013   Allergic rhinitis due to pollen 06/13/2010   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 [place] [address] Phone: [phone] Fax: [phone]     Social Determinants of Health (SDOH) Interventions    Readmission Risk Interventions    11/29/2021   10:32 AM  Readmission Risk Prevention Plan  Post Dischage Appt Complete  Medication Screening Complete  Transportation Screening Complete

## 2021-11-29 NOTE — Progress Notes (Signed)
PROGRESS NOTE    Mary [name]  NLZ:[id number] DOB: 1945/07/21 DOA: 11/26/2021 PCP: [name], DO    Brief Narrative:   Mary [name] is a 76 y.o. 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  Patient had left hip replacement 4 days ago and has been doing well with physical therapy and has been able to ambulate with a walker since the last 2 days.  Patient needed to use the restroom (unfortunately, husband already stepped out on an errand), on returning from the restroom, while trying to sit down, she accidentally fell with her right hip on the walker, thereby sustaining pain rated as 10/10 on pain scale.   Found to have a fracture and underwent surgery 8/28.    Assessment and Plan: Left femoral fracture CT of left lower extremity showed acute comminuted fracture of the mid and proximal femur from the level of the greater trochanter to the level of the mid diaphysis She was treated with fentanyl, Dilaudid, morphine  -s/p OR: /p revision left hip arthroplasty femoral component only for periprosthetic left femur fracture 11/28/2021 -PT eval  Fall at home Continue fall precaution -PT eval   Hyponatremia -trend   ABLA -suspected after surgery -CBC in AM  Bronchiectasis -pulmonology toilet  ?UTI -already on 3 days of IV abx for surgery  DVT prophylaxis: SCDs Start: 11/26/21 2115    Code Status: Full Code Family Communication: at bedside  Disposition Plan:  Level of care: Med-Surg Status is: Inpatient Remains inpatient appropriate because: SNF?    Consultants:  ortho   Subjective: No complaints this AM except some pain  Objective: Vitals:   11/28/21 1526 11/28/21 1958 11/29/21 0405 11/29/21 0753  BP: 114/70 (!)  108/45 (!) 113/50   Pulse: 88 96 84 84  Resp: 15 20 19 18   Temp: (!) 97.5 F (36.4 C) 98.1 F (36.7 C) 97.8 F (36.6 C)   TempSrc: Oral Oral Oral   SpO2: 95% 91% 96% 91%  Weight:      Height:        Intake/Output Summary (Last 24 hours) at 11/29/2021 1242 Last data filed at 11/29/2021 0651 Gross per 24 hour  Intake 1188.19 ml  Output 725 ml  Net 463.19 ml   Filed Weights   11/26/21 1411 11/27/21 0120 11/28/21 0914  Weight: 47.2 kg 50.5 kg 50.5 kg    Examination:    General: Appearance:    Thin female in no acute distress     Lungs:     respirations unlabored  Heart:    Normal heart rate.    MS:   All extremities are intact.   Neurologic:   Awake, alert       Data Reviewed: I have personally reviewed following labs and imaging studies  CBC: Recent Labs  Lab 11/26/21 1817 11/27/21 0536 11/28/21 0458 11/29/21 0545  WBC 7.1 8.5 12.5* 13.3*  NEUTROABS 5.6  --   --   --   HGB 10.8* 10.7* 9.7* 7.7*  HCT 32.4* 32.3* 30.8* 24.6*  MCV 92.8 92.6 96.9 96.1  PLT 226 268 243 275   Basic Metabolic Panel: Recent Labs  Lab 11/26/21 1817 11/27/21 0536 11/28/21 0458 11/29/21 0545  NA 131* 135 132* 131*  K 3.9 4.9 4.5 5.0  CL 103 105 103 102  CO2 20* 23 23 24   GLUCOSE 135* 114* 86 126*  BUN 18 26* 26* 26*  CREATININE 0.46 0.60 0.54 0.58  CALCIUM 7.9* 8.2* 7.9* 7.6*  MG  --  2.2  --   --   PHOS  --  3.7  --   --    GFR: Estimated Creatinine Clearance: 47.7 mL/min (by C-G formula based on SCr of 0.58 mg/dL). Liver Function Tests: Recent Labs  Lab 11/27/21 0536  AST 38  ALT 26  ALKPHOS 100  BILITOT 1.2  PROT 6.2*  ALBUMIN 3.3*   No results for input(s): "LIPASE", "AMYLASE" in the last 168 hours. No results for input(s): "AMMONIA" in the last 168 hours. Coagulation Profile: No results for input(s): "INR", "PROTIME" in the last 168 hours. Cardiac Enzymes: No results for input(s): "CKTOTAL", "CKMB", "CKMBINDEX", "TROPONINI" in the last 168 hours. BNP  (last 3 results) No results for input(s): "PROBNP" in the last 8760 hours. HbA1C: No results for input(s): "HGBA1C" in the last 72 hours. CBG: No results for input(s): "GLUCAP" in the last 168 hours. Lipid Profile: No results for input(s): "CHOL", "HDL", "LDLCALC", "TRIG", "CHOLHDL", "LDLDIRECT" in the last 72 hours. Thyroid Function Tests: No results for input(s): "TSH", "T4TOTAL", "FREET4", "T3FREE", "THYROIDAB" in the last 72 hours. Anemia Panel: No results for input(s): "VITAMINB12", "FOLATE", "FERRITIN", "TIBC", "IRON", "RETICCTPCT" in the last 72 hours. Sepsis Labs: No results for input(s): "PROCALCITON", "LATICACIDVEN" in the last 168 hours.  No results found for this or any previous visit (from the past 240 hour(s)).       Radiology Studies: DG HIP UNILAT W OR W/O PELVIS 2-3 VIEWS LEFT  Result Date: 11/28/2021 CLINICAL DATA:  Fracture left femur EXAM: DG HIP (WITH OR WITHOUT PELVIS) 2-3V LEFT COMPARISON:  11/26/2021 FINDINGS: There is interval revision of left hip arthroplasty. There is internal fixation of fracture of shaft of left femur. There is marked improvement in alignment of fracture fragments. Skin staples are noted. Degenerative changes are noted in right hip. IMPRESSION: Interval reduction and internal fixation of displaced fracture of shaft of left femur. There is interval revision of left hip arthroplasty. Electronically Signed   By: 11/28/2021 M.D.   On: 11/28/2021 14:51   DG HIP UNILAT WITH PELVIS 1V LEFT  Result Date: 11/28/2021 CLINICAL DATA:  Left hip arthroplasty revision EXAM: DG HIP (WITH OR WITHOUT PELVIS) 1V*L*; DG C-ARM 1-60 MIN-NO REPORT COMPARISON:  11/26/2021 FLUOROSCOPY: Air kerma 1.41 mGy FINDINGS: Intraoperative fluoroscopic images of the left hip demonstrate total arthroplasty revision with multiple cerclage wires. IMPRESSION: Intraoperative fluoroscopic images demonstrate left hip arthroplasty revision. Electronically Signed   By: 11/28/2021 M.D.   On: 11/28/2021 13:30   DG C-Arm 1-60 Min-No Report  Result Date: 11/28/2021 Fluoroscopy was utilized by the requesting physician.  No radiographic interpretation.   DG C-Arm 1-60 Min-No Report  Result Date: 11/28/2021 Fluoroscopy was utilized by the requesting physician.  No radiographic interpretation.        Scheduled Meds:  sodium chloride   Intravenous Once   sodium chloride   Intravenous Once   acetaminophen  500 mg Oral Q8H   aspirin EC  81 mg Oral BID   gabapentin  100 mg Oral QHS   pantoprazole  40 mg Oral Q1200   sodium chloride HYPERTONIC  4 mL Nebulization Daily   traMADol  50 mg Oral Q6H   Continuous Infusions:  sodium chloride Stopped (11/28/21 0852)    ceFAZolin (  ANCEF) IV Stopped (11/29/21 0553)     LOS: 3 days    Time spent: 45 minutes spent on chart review, discussion with nursing staff, consultants, updating family and interview/physical exam; more than 50% of that time was spent in counseling and/or coordination of care.    Joseph Art, DO [facility] Available via Epic secure chat 7am-7pm After these hours, please refer to coverage provider listed on [email or web address] 11/29/2021, 12:42 PM

## 2021-11-29 NOTE — Progress Notes (Signed)
     Subjective:  Patient reports that pain is well controlled overall.  Son at bedside this morning.  Did a little bit of in bed exercise yesterday with physical therapy.  Denies distal numbness and tingling.  Hemoglobin 7.7 this morning.  Objective:   VITALS:   Vitals:   11/28/21 1500 11/28/21 1526 11/28/21 1958 11/29/21 0405  BP: (!) 134/52 114/70 (!) 108/45 (!) 113/50  Pulse: 84 88 96 84  Resp: _0 Temp:  (!) 97.5 F (36.4 C) 98.1 F (36.7 C) 97.8 F (36.6 C)  TempSrc:  Oral Oral Oral  SpO2: 100% 95% 91% 96%  Weight:      Height:        Sensation intact distally Intact pulses distally Dorsiflexion/Plantar flexion intact Incision: Lateral Aquacel dressing with small amount of strikethrough spots less than 5 mm. Compartment soft   Lab Results  Component Value Date   WBC 13.3 (H) 11/29/2021   HGB 7.7 (L) 11/29/2021   HCT 24.6 (L) 11/29/2021   MCV 96.1 11/29/2021   PLT 275 11/29/2021   BMET    Component Value Date/Time   NA 132 (L) 11/28/2021 0458   NA 136 11/29/2020 1007   K 4.5 11/28/2021 0458   CL 103 11/28/2021 0458   CO2 23 11/28/2021 0458   GLUCOSE 86 11/28/2021 0458   BUN 26 (H) 11/28/2021 0458   BUN 11 11/29/2020 1007   CREATININE 0.54 11/28/2021 0458   CREATININE 0.72 02/04/2014 0830   CALCIUM 7.9 (L) 11/28/2021 0458   EGFR 92 11/29/2020 1007   GFRNONAA >60 11/28/2021 0458      Xray: Postop x-rays demonstrate excellent alignment of revision femoral component and reduction of the periprosthetic fracture, restoration of leg lengths and offset.  No adverse features.  Assessment/Plan: 1 Day Post-Op   Principal Problem:   Closed left femoral fracture (HCC) Active Problems:   Bronchiectasis (HCC)   S/P total left hip arthroplasty   Fall at home, initial encounter   Hyponatremia  S/p revision left hip arthroplasty femoral component only for periprosthetic left femur fracture 11/28/2021  Post op recs: WB: 20% partial weightbearing  left lower extremity x6 weeks with posterior hip precautions, troch precautions: no active abduction Abx: Ancef 2 g every 8 in house or until at least postop day 3, plan for discharge on 7 days cefadroxil 500 twice daily Imaging: PACU pelvis Xray Dressing: Aquacell, keep intact until follow up DVT prophylaxis: Aspirin 81BID starting POD1 Follow up: 2 weeks after surgery for a wound check with Dr. Zachery Dakins at [facility].  Address: [address] [place], [place], [place] [address]  Office Phone: [phone]      Willaim Sheng 11/29/2021, 6:46 AM   Charlies Constable, MD  Contact information:   [phone] 7am-5pm epic message Dr. Zachery Dakins, or call office for patient follow up: [phone] After hours and holidays please check [email or web address] for group call information for [facility]

## 2021-11-30 DIAGNOSIS — D62 Acute posthemorrhagic anemia: Secondary | ICD-10-CM

## 2021-11-30 DIAGNOSIS — E871 Hypo-osmolality and hyponatremia: Secondary | ICD-10-CM | POA: Diagnosis not present

## 2021-11-30 DIAGNOSIS — R339 Retention of urine, unspecified: Secondary | ICD-10-CM

## 2021-11-30 DIAGNOSIS — S7292XD Unspecified fracture of left femur, subsequent encounter for closed fracture with routine healing: Secondary | ICD-10-CM

## 2021-11-30 DIAGNOSIS — Z8709 Personal history of other diseases of the respiratory system: Secondary | ICD-10-CM

## 2021-11-30 DIAGNOSIS — S7292XS Unspecified fracture of left femur, sequela: Secondary | ICD-10-CM | POA: Diagnosis not present

## 2021-11-30 DIAGNOSIS — K5900 Constipation, unspecified: Secondary | ICD-10-CM

## 2021-11-30 LAB — BASIC METABOLIC PANEL
Anion gap: 4 — ABNORMAL LOW (ref 5–15)
BUN: 22 mg/dL (ref 8–23)
CO2: 25 mmol/L (ref 22–32)
Calcium: 7.3 mg/dL — ABNORMAL LOW (ref 8.9–10.3)
Chloride: 99 mmol/L (ref 98–111)
Creatinine, Ser: 0.45 mg/dL (ref 0.44–1.00)
GFR, Estimated: >60 mL/min (ref >60)
Glucose, Bld: 99 mg/dL (ref 70–99)
Potassium: 4.4 mmol/L (ref 3.5–5.1)
Sodium: 128 mmol/L — ABNORMAL LOW (ref 135–145)

## 2021-11-30 LAB — CBC
HCT: 21.8 % — ABNORMAL LOW (ref 36.0–46.0)
Hemoglobin: 7.1 g/dL — ABNORMAL LOW (ref 12.0–15.0)
MCH: 30.9 pg (ref 26.0–34.0)
MCHC: 32.6 g/dL (ref 30.0–36.0)
MCV: 94.8 fL (ref 80.0–100.0)
Platelets: 271 10*3/uL (ref 150–400)
RBC: 2.3 MIL/uL — ABNORMAL LOW (ref 3.87–5.11)
RDW: 13.8 % (ref 11.5–15.5)
WBC: 10.9 10*3/uL — ABNORMAL HIGH (ref 4.0–10.5)
nRBC: 0 % (ref 0.0–0.2)

## 2021-11-30 LAB — URINE CULTURE: Culture: <10000 — AB

## 2021-11-30 LAB — PREPARE RBC (CROSSMATCH)

## 2021-11-30 MED ORDER — POLYETHYLENE GLYCOL 3350 17 G PO PACK
17.0000 g | PACK | Freq: Every day | ORAL | Status: DC
  Administered 2021-11-30 – 2021-12-01 (×2): 17 g via ORAL
  Filled 2021-11-30: qty 1

## 2021-11-30 MED ORDER — CHLORHEXIDINE GLUCONATE CLOTH 2 % EX PADS
6.0000 | MEDICATED_PAD | Freq: Every day | CUTANEOUS | Status: DC
  Administered 2021-11-30 – 2021-12-02 (×2): 6 via TOPICAL

## 2021-11-30 MED ORDER — SODIUM CHLORIDE 0.9% IV SOLUTION: Freq: Once | INTRAVENOUS | Status: AC

## 2021-11-30 MED ORDER — SODIUM CHLORIDE 0.9 % IV SOLN: INTRAVENOUS | Status: AC

## 2021-11-30 MED ORDER — SENNOSIDES-DOCUSATE SODIUM 8.6-50 MG PO TABS
2.0000 | ORAL_TABLET | Freq: Two times a day (BID) | ORAL | Status: DC
  Administered 2021-11-30 – 2021-12-02 (×5): 2 via ORAL
  Filled 2021-11-30 (×5): qty 2

## 2021-11-30 NOTE — Progress Notes (Signed)
     Subjective: Patient doing well today.  She was able to get to the chair yesterday.  She is working well with physical therapy.  Reports pain well controlled.  She has difficulty with voiding since Foley catheter was removed yesterday morning.  She required 1 straight cath.  Encouraged her to get at the bedside commode for urination.  She denies distal numbness and tingling.  Hemoglobin trending down to 7.1 today; 1 unit PRBCs ordered for her by the medicine team.  Objective:   VITALS:   Vitals:   11/30/21 0000 11/30/21 0449 11/30/21 1126 11/30/21 1200  BP:  (!) 151/65 (!) 124/58 114/71  Pulse:  (!) 101 88 84  Resp: $Remo'12 18 18 18  'VjtLZ$ Temp:  97.7 F (36.5 C) (!) 97.5 F (36.4 C) 98.2 F (36.8 C)  TempSrc:  Oral Oral Oral  SpO2:  93% 94% 94%  Weight:      Height:        Sensation intact distally Intact pulses distally Dorsiflexion/Plantar flexion intact Incision: Lateral Aquacel dressing with small amount of strikethrough spots approximately 35mm. Compartment soft   Lab Results  Component Value Date   WBC 10.9 (H) 11/30/2021   HGB 7.1 (L) 11/30/2021   HCT 21.8 (L) 11/30/2021   MCV 94.8 11/30/2021   PLT 271 11/30/2021   BMET    Component Value Date/Time   NA 128 (L) 11/30/2021 0525   NA 136 11/29/2020 1007   K 4.4 11/30/2021 0525   CL 99 11/30/2021 0525   CO2 25 11/30/2021 0525   GLUCOSE 99 11/30/2021 0525   BUN 22 11/30/2021 0525   BUN 11 11/29/2020 1007   CREATININE 0.45 11/30/2021 0525   CREATININE 0.72 02/04/2014 0830   CALCIUM 7.3 (L) 11/30/2021 0525   EGFR 92 11/29/2020 1007   GFRNONAA >60 11/30/2021 0525      Xray: Postop x-rays demonstrate excellent alignment of revision femoral component and reduction of the periprosthetic fracture, restoration of leg lengths and offset.  No adverse features.  Assessment/Plan: 2 Days Post-Op   Principal Problem:   Closed left femoral fracture (HCC) Active Problems:   Bronchiectasis (HCC)   S/P total left hip  arthroplasty   Fall at home, initial encounter   Hyponatremia  S/p revision left hip arthroplasty femoral component only for periprosthetic left femur fracture 11/28/2021  Post op recs: WB: 20% partial weightbearing left lower extremity x6 weeks with posterior hip precautions, troch precautions: no active abduction Abx: Ancef 2 g every 8 in house or until at least postop day 3, plan for discharge on 7 days cefadroxil 500 twice daily Imaging: PACU pelvis Xray Dressing: Aquacell, keep intact until follow up DVT prophylaxis: Aspirin 81BID starting POD1 Follow up: 2 weeks after surgery for a wound check with Dr. Zachery Dakins at [facility].  Address: [address] [place], [place], [place] [address]  Office Phone: [phone]      Willaim Sheng 11/30/2021, 1:18 PM   Charlies Constable, MD  Contact information:   [phone] 7am-5pm epic message Dr. Zachery Dakins, or call office for patient follow up: [phone] After hours and holidays please check [email or web address] for group call information for [facility]

## 2021-11-30 NOTE — Progress Notes (Addendum)
Physical Therapy Treatment Patient Details Name: Mary [name] MRN: [id number] DOB: January 15, 1946 Today's Date: 11/30/2021   History of Present Illness Pt is a 76yo female  who had  L-THA, AA on 11/22/21, fell in bathroom resulting in L hip fx/periprosthetic fx.(Imaging shows a left hip comminuted nondisplaced fracture in the region of the  greater trochanter. There is also an oblique displaced fracture extending from the level of the greater trochanter to proximally 2 cm distal to the tip of the femoral prosthesis. This fracture is displaced distal to the level of the prosthesis with apex lateral angulation). orhto consulted and pt underwent L  hip ORIF + femoral component  revision on 11/28/21.  PMH: Anemia, brochiectasis, COPD, GERD, anterior cervical fusion 1992.    PT Comments    POD # 3 Am session withheld due to HgB 7.1 Will see after one full unit. General Comments: AxO x3 very pleasant lady but in a lot pain and not feeling well today.  Receiving blood transfusion and reinserted Foley Assisted OOB to take a few steps then position in recliner was difficult. General bed mobility comments: Max Asisst + 2 back to transition from supine to EOB using bed pad plus increased time.  Limited by pain, effort and MAX c/o weakness/fatigue. General transfer comment: + 2 side by side assist from bed onto B Platform EVA walker. General Gait Details: + 2 Max Assist with EVA walker pt was able to take a few steps forward approx 2 feet with much effort and recliner following for safety.  Distance limited by pain/effort/fatigue.  Use of B platform EVA walker maintain 20% WB restriction.  Applied 3 packs down L LE.  Pt will need to be a LIFT back to bed due to increased pain and weakness reported today.   Recommendations for follow up therapy are one component of a multi-disciplinary discharge planning process, led by the attending physician.  Recommendations may be updated based on patient status,  additional functional criteria and insurance authorization.  Follow Up Recommendations  Skilled nursing-short term rehab (<3 hours/day) Can patient physically be transported by private vehicle: No   Assistance Recommended at Discharge Frequent or constant Supervision/Assistance  Patient can return home with the following A little help with walking and/or transfers;Assistance with cooking/housework;Assist for transportation;Help with stairs or ramp for entrance;A lot of help with bathing/dressing/bathroom   Equipment Recommendations  None recommended by PT    Recommendations for Other Services       Precautions / Restrictions Precautions Precautions: Posterior Hip;Fall Precaution Comments: educated each visit on THP and PWB Restrictions Weight Bearing Restrictions: Yes LLE Weight Bearing: Partial weight bearing LLE Partial Weight Bearing Percentage or Pounds: 20% Other Position/Activity Restrictions: NO ACTIVE ABDuction     Mobility  Bed Mobility Overal bed mobility: Needs Assistance Bed Mobility: Supine to Sit     Supine to sit: Max assist, +2 for physical assistance, +2 for safety/equipment     General bed mobility comments: Max Asisst + 2 back to transition from supine to EOB using bed pad plus increased time.  Limited by pain, effort and MAX c/o weakness/fatigue.    Transfers Overall transfer level: Needs assistance Equipment used: 2 person hand held assist Transfers: Sit to/from Stand Sit to Stand: Max assist, +2 physical assistance, +2 safety/equipment, From elevated surface           General transfer comment: + 2 side by side assist from bed onto B Platform EVA walker.    Ambulation/Gait Ambulation/Gait assistance: Max  assist, +2 physical assistance, +2 safety/equipment Gait Distance (Feet): 2 Feet Assistive device: Bilateral platform walker   Gait velocity: decreased     General Gait Details: + 2 Max Assist with EVA walker pt was able to take a few  steps forward approx 2 feet with much effort and recliner following for safety.  Distance limited by pain/effort/fatigue.  Use of B platform EVA walker maintain 20% WB restriction.   Stairs             Wheelchair Mobility    Modified Rankin (Stroke Patients Only)       Balance                                            Cognition Arousal/Alertness: Awake/alert Behavior During Therapy: WFL for tasks assessed/performed Overall Cognitive Status: Within Functional Limits for tasks assessed                                 General Comments: AxO x3 very pleasant lady but in a lot pain and not feeling well today.  Receiving blood transfusion and reinserted Foley        Exercises      General Comments        Pertinent Vitals/Pain Pain Assessment Pain Assessment: 0-10 Pain Score: 10-Worst pain ever Pain Location: left hip Pain Descriptors / Indicators: Operative site guarding, Grimacing, Sore, Tender Pain Intervention(s): Monitored during session, Premedicated before session, Repositioned, Ice applied    Home Living                          Prior Function            PT Goals (current goals can now be found in the care plan section) Progress towards PT goals: Progressing toward goals    Frequency    Min 4X/week      PT Plan Current plan remains appropriate    Co-evaluation              AM-PAC PT "6 Clicks" Mobility   Outcome Measure  Help needed turning from your back to your side while in a flat bed without using bedrails?: A Lot Help needed moving from lying on your back to sitting on the side of a flat bed without using bedrails?: A Lot Help needed moving to and from a bed to a chair (including a wheelchair)?: A Lot Help needed standing up from a chair using your arms (e.g., wheelchair or bedside chair)?: A Lot Help needed to walk in hospital room?: A Lot Help needed climbing 3-5 steps with a railing?  : Total 6 Click Score: 11    End of Session Equipment Utilized During Treatment: Gait belt Activity Tolerance: Patient limited by fatigue;Patient limited by pain Patient left: in chair;with call bell/phone within reach;with chair alarm set Nurse Communication: Mobility status;Need for lift equipment (MAXI MOVE back to bed) PT Visit Diagnosis: Pain;Difficulty in walking, not elsewhere classified (R26.2) Pain - Right/Left: Left Pain - part of body: Hip     Time: 7673-4193 PT Time Calculation (min) (ACUTE ONLY): 24 min  Charges:  $Gait Training: 8-22 mins $Therapeutic Activity: 8-22 mins                    Felecia Shelling  PTA Acute  Rehabilitation Services Office M-F          [phone] Weekend pager [phone]

## 2021-11-30 NOTE — Progress Notes (Signed)
Physical Therapy Treatment Patient Details Name: Mary [name] MRN: [id number] DOB: 12-17-1945 Today's Date: 11/30/2021   History of Present Illness Pt is a 76yo female  who had  L-THA, AA on 11/22/21, fell in bathroom resulting in L hip fx/periprosthetic fx.(Imaging shows a left hip comminuted nondisplaced fracture in the region of the  greater trochanter. There is also an oblique displaced fracture extending from the level of the greater trochanter to proximally 2 cm distal to the tip of the femoral prosthesis. This fracture is displaced distal to the level of the prosthesis with apex lateral angulation). orhto consulted and pt underwent L  hip ORIF + femoral component  revision on 11/28/21.  PMH: Anemia, brochiectasis, COPD, GERD, anterior cervical fusion 1992.    PT Comments    POD # 3 pm session Pt was in recliner from 3:30 - 5:30 pm.  Tolerated well.  "I don't hurt bad as long as I don't move". Assisted her back to bed.  General transfer comment: + 2 side by side support with extra assist under axillary to ensure limited WBing.  General bed mobility comments: Assisted back to bed + 2 assist and full support B LE to avoid active L LE ABDduction. Positioned to comfort.  Applied ICE.  Recommendations for follow up therapy are one component of a multi-disciplinary discharge planning process, led by the attending physician.  Recommendations may be updated based on patient status, additional functional criteria and insurance authorization.  Follow Up Recommendations  Skilled nursing-short term rehab (<3 hours/day) Can patient physically be transported by private vehicle: No   Assistance Recommended at Discharge Frequent or constant Supervision/Assistance  Patient can return home with the following A little help with walking and/or transfers;Assistance with cooking/housework;Assist for transportation;Help with stairs or ramp for entrance;A lot of help with bathing/dressing/bathroom    Equipment Recommendations  None recommended by PT    Recommendations for Other Services       Precautions / Restrictions Precautions Precautions: Posterior Hip;Fall Precaution Comments: educated each visit on THP and PWB Restrictions Weight Bearing Restrictions: Yes LLE Weight Bearing: Partial weight bearing LLE Partial Weight Bearing Percentage or Pounds: 20 Other Position/Activity Restrictions: NO ACTIVE ABDuction     Mobility  Bed Mobility Overal bed mobility: Needs Assistance Bed Mobility: Sit to Supine      Sit to supine: Total assist, +2 for physical assistance, +2 for safety/equipment   General bed mobility comments: Assisted back to bed + 2 assist and full support B LE to avoid active L LE ABDduction.    Transfers Overall transfer level: Needs assistance Equipment used: 2 person hand held assist Transfers: Sit to/from Stand Sit to Stand: Max assist, +2 physical assistance, +2 safety/equipment, From elevated surface           General transfer comment: + 2 side by side support with extra assist under axillary to ensure limited WBing    Ambulation/Gait      General Gait Details: transfer only back to bed   Stairs             Wheelchair Mobility    Modified Rankin (Stroke Patients Only)       Balance                                            Cognition Arousal/Alertness: Awake/alert Behavior During Therapy: WFL for tasks assessed/performed Overall Cognitive Status:  Within Functional Limits for tasks assessed                                 General Comments: AxO x3 very pleasant lady but in a lot pain and not feeling well today.  Receiving blood transfusion and reinserted Foley        Exercises      General Comments        Pertinent Vitals/Pain Pain Assessment Pain Assessment: 0-10 Pain Score: 10-Worst pain ever Pain Location: left hip Pain Descriptors / Indicators: Operative site guarding,  Grimacing, Sore, Tender Pain Intervention(s): Monitored during session, Premedicated before session, Repositioned, Ice applied    Home Living                          Prior Function            PT Goals (current goals can now be found in the care plan section) Progress towards PT goals: Progressing toward goals    Frequency    Min 4X/week      PT Plan Current plan remains appropriate    Co-evaluation              AM-PAC PT "6 Clicks" Mobility   Outcome Measure  Help needed turning from your back to your side while in a flat bed without using bedrails?: A Lot Help needed moving from lying on your back to sitting on the side of a flat bed without using bedrails?: A Lot Help needed moving to and from a bed to a chair (including a wheelchair)?: A Lot Help needed standing up from a chair using your arms (e.g., wheelchair or bedside chair)?: A Lot Help needed to walk in hospital room?: A Lot Help needed climbing 3-5 steps with a railing? : Total 6 Click Score: 11    End of Session Equipment Utilized During Treatment: Gait belt Activity Tolerance: Patient limited by fatigue;Patient limited by pain Patient left: in bed;with bed alarm set;with call bell/phone within reach Nurse Communication: Mobility status PT Visit Diagnosis: Pain;Difficulty in walking, not elsewhere classified (R26.2) Pain - Right/Left: Left Pain - part of body: Hip     Time: 1714-1730 PT Time Calculation (min) (ACUTE ONLY): 16 min  Charges:  $Therapeutic Activity: 8-22 mins                     Felecia Shelling  PTA Acute  Rehabilitation Services Office M-F          [phone] Weekend pager [phone]

## 2021-11-30 NOTE — Progress Notes (Addendum)
[facility] PROGRESS NOTE   Mary [name] BZJ:[id number] DOB: Sep 28, 1945 DOA: 11/26/2021  PCP: [name], DO  Brief History/Interval Summary: 76 y.o. 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  Patient had left hip replacement 4 days prior to this admission and had been doing well with physical therapy.   Found to have a fracture and underwent surgery 8/28.    Consultants: Orthopedics  Procedures: ORIF left periprosthetic femur fracture 8/28    Subjective/Interval History: Patient complains of constipation.  Complains of having difficulty urinating.  Apparently Foley catheter was removed yesterday and she required in and out catheterization overnight.  Left leg pain is reasonably well controlled.    Assessment/Plan:  Left femoral fracture Status post ORIF on 8/28 Management per orthopedics.  PT and OT evaluation.  Pain control.  Initiate bowel regimen for constipation.  Urinary retention Had to undergo in and out catheterization overnight.  This is multifactorial including immobility, pain issues, recent fracture.  Bladder scans have been ordered.  She is allergic to sulfa drugs.  Acute blood loss anemia Drop in hemoglobin noted.  She is fatigued.  We will transfuse her 1 unit of PRBC today.  Constipation Initiate bowel regimen.  Hyponatremia Continue with normal saline infusion.  Recheck labs tomorrow.  History of bronchiectasis Pulmonary toilet.  Concern for UTI Remains on antibiotics.  Follow-up on urine cultures.  DVT Prophylaxis: On aspirin twice daily Code Status: Full code Family Communication: Discussed with patient and her son Disposition Plan: PT and OT evaluation.  Will likely need skilled nursing  facility for short-term rehab  Status is: Inpatient Remains inpatient appropriate because: Femur fracture, acute blood loss anemia      Medications: Scheduled:  sodium chloride   Intravenous Once   sodium chloride   Intravenous Once   sodium chloride   Intravenous Once   acetaminophen  500 mg Oral Q8H   aspirin EC  81 mg Oral BID   gabapentin  100 mg Oral QHS   pantoprazole  40 mg Oral Q1200   polyethylene glycol  17 g Oral Daily   senna-docusate  2 tablet Oral BID   traMADol  50 mg Oral Q6H   Continuous:  sodium chloride 75 mL/hr at 11/30/21 0944    ceFAZolin (ANCEF) IV 2 g (11/30/21 0175)   ZWC:HENIDPOEUMPNT **OR** acetaminophen, albuterol, HYDROcodone-acetaminophen, HYDROmorphone (DILAUDID) injection, methocarbamol, ondansetron **OR** ondansetron (ZOFRAN) IV, mouth rinse, oxyCODONE, polyvinyl alcohol  Antibiotics: Anti-infectives (From admission, onward)    Start     Dose/Rate Route Frequency Ordered Stop   11/29/21 0815  cefTRIAXone (ROCEPHIN) 1 g in sodium chloride 0.9 % 100 mL IVPB  Status:  Discontinued        1 g 200 mL/hr over 30 Minutes Intravenous Every 24 hours 11/29/21 0725 11/29/21 0725   11/29/21 0000  ceFAZolin (ANCEF) IVPB 2g/100 mL premix  Status:  Discontinued        2 g 200 mL/hr over 30 Minutes Intravenous Every 6 hours 11/28/21 1817 11/28/21 1956   11/28/21 2200  ceFAZolin (ANCEF) IVPB 2g/100 mL premix        2 g 200 mL/hr over 30 Minutes Intravenous Every 8 hours 11/28/21 1956 12/01/21 2159   11/28/21 1800  ceFAZolin (ANCEF) IVPB 2g/100 mL premix  Status:  Discontinued        2 g 200 mL/hr over 30 Minutes Intravenous Every 8 hours 11/28/21 1535 11/28/21 1817   11/28/21 1313  clarithromycin (BIAXIN) tablet 250 mg  Status:  Discontinued       Note to Pharmacy: Take 0.5 tablet (250 mg) by mouth twice daily for 7 days (one week out of each month every other month--rotating with augmentin antibiotic every other month)      250 mg Oral See admin  instructions 11/28/21 1314 11/28/21 1523   11/28/21 0930  ceFAZolin (ANCEF) IVPB 2g/100 mL premix        2 g 200 mL/hr over 30 Minutes Intravenous On call to O.R. 11/28/21 0842 11/28/21 1119       Objective:  Vital Signs  Vitals:   11/29/21 2238 11/29/21 2300 11/30/21 0000 11/30/21 0449  BP:    (!) 151/65  Pulse:    (!) 101  Resp: 13 13 12 18   Temp:    97.7 F (36.5 C)  TempSrc:    Oral  SpO2:    93%  Weight:      Height:        Intake/Output Summary (Last 24 hours) at 11/30/2021 1049 Last data filed at 11/30/2021 0100 Gross per 24 hour  Intake 100.06 ml  Output 650 ml  Net -549.94 ml   Filed Weights   11/26/21 1411 11/27/21 0120 11/28/21 0914  Weight: 47.2 kg 50.5 kg 50.5 kg    General appearance: Awake alert.  In no distress Resp: Clear to auscultation bilaterally.  Normal effort Cardio: S1-S2 is normal regular.  No S3-S4.  No rubs murmurs or bruit GI: Abdomen is soft.  Nontender nondistended.  Bowel sounds are present normal.  No masses organomegaly Extremities: Swelling of the left lower extremity is noted.  Mainly in the thigh area Neurologic: Alert and oriented x3.  No focal neurological deficits.    Lab Results:  Data Reviewed: I have personally reviewed following labs and reports of the imaging studies  CBC: Recent Labs  Lab 11/26/21 1817 11/27/21 0536 11/28/21 0458 11/29/21 0545 11/30/21 0525  WBC 7.1 8.5 12.5* 13.3* 10.9*  NEUTROABS 5.6  --   --   --   --   HGB 10.8* 10.7* 9.7* 7.7* 7.1*  HCT 32.4* 32.3* 30.8* 24.6* 21.8*  MCV 92.8 92.6 96.9 96.1 94.8  PLT 226 268 243 275 271    Basic Metabolic Panel: Recent Labs  Lab 11/26/21 1817 11/27/21 0536 11/28/21 0458 11/29/21 0545 11/30/21 0525  NA 131* 135 132* 131* 128*  K 3.9 4.9 4.5 5.0 4.4  CL 103 105 103 102 99  CO2 20* 23 23 24 25   GLUCOSE 135* 114* 86 126* 99  BUN 18 26* 26* 26* 22  CREATININE 0.46 0.60 0.54 0.58 0.45  CALCIUM 7.9* 8.2* 7.9* 7.6* 7.3*  MG  --  2.2  --   --   --    PHOS  --  3.7  --   --   --     GFR: Estimated Creatinine Clearance: 47.7 mL/min (by C-G formula based on SCr of 0.45 mg/dL).  Liver Function Tests: Recent Labs  Lab 11/27/21 0536  AST 38  ALT 26  ALKPHOS 100  BILITOT 1.2  PROT 6.2*  ALBUMIN 3.3*     Recent Results (from the past 240 hour(s))  Urine Culture     Status: Abnormal (Preliminary result)   Collection Time: 11/26/21  2:02 PM   Specimen:  Urine, Clean Catch  Result Value Ref Range Status   Specimen Description   Final    URINE, CLEAN CATCH Performed at [facility], [place] [address], [place], [place] [address]    Special Requests   Final    NONE Performed at [facility], [place] [address] [place], [place] [address]    Culture (A)  Final    10,000 COLONIES/mL ESCHERICHIA COLI SUSCEPTIBILITIES TO FOLLOW Performed at [facility], [place] [address], [place],  [address]    Report Status PENDING  Incomplete      Radiology Studies: DG HIP UNILAT W OR W/O PELVIS 2-3 VIEWS LEFT  Result Date: 11/28/2021 CLINICAL DATA:  Fracture left femur EXAM: DG HIP (WITH OR WITHOUT PELVIS) 2-3V LEFT COMPARISON:  11/26/2021 FINDINGS: There is interval revision of left hip arthroplasty. There is internal fixation of fracture of shaft of left femur. There is marked improvement in alignment of fracture fragments. Skin staples are noted. Degenerative changes are noted in right hip. IMPRESSION: Interval reduction and internal fixation of displaced fracture of shaft of left femur. There is interval revision of left hip arthroplasty. Electronically Signed   By: Elmer Picker M.D.   On: 11/28/2021 14:51   DG HIP UNILAT WITH PELVIS 1V LEFT  Result Date: 11/28/2021 CLINICAL DATA:  Left hip arthroplasty revision EXAM: DG HIP (WITH OR WITHOUT PELVIS) 1V*L*; DG C-ARM 1-60 MIN-NO REPORT COMPARISON:  11/26/2021 FLUOROSCOPY: Air kerma 1.41 mGy FINDINGS: Intraoperative fluoroscopic images of the  left hip demonstrate total arthroplasty revision with multiple cerclage wires. IMPRESSION: Intraoperative fluoroscopic images demonstrate left hip arthroplasty revision. Electronically Signed   By: Delanna Ahmadi M.D.   On: 11/28/2021 13:30   DG C-Arm 1-60 Min-No Report  Result Date: 11/28/2021 Fluoroscopy was utilized by the requesting physician.  No radiographic interpretation.   DG C-Arm 1-60 Min-No Report  Result Date: 11/28/2021 Fluoroscopy was utilized by the requesting physician.  No radiographic interpretation.       LOS: 4 days   Sophina Mitten [employer] on [email or web address]  11/30/2021, 10:49 AM

## 2021-11-30 NOTE — TOC Progression Note (Signed)
Transition of Care [facility]) - Progression Note    Patient Details  Name: Mary [name] MRN: [id number] Date of Birth: 04-06-1945  [facility] CM/SW Contact  Otelia Santee, LCSW Phone Number: 11/30/2021, 10:59 AM  Clinical Narrative:    Reviewed bed offers for SNF with pt and husband at bedside. Pt has accepted bed offer for [facility]. Insurance authorization will need to be requested once pt is medically clear.    Expected Discharge Plan: Skilled Nursing Facility Barriers to Discharge: Continued Medical Work up  Expected Discharge Plan and Services Expected Discharge Plan: Skilled Nursing Facility In-house Referral: Clinical Social Work Discharge Planning Services: CM Consult Post Acute Care Choice: Home Health Living arrangements for the past 2 months: Single Family Home                 DME Arranged: N/A DME Agency: NA                   Social Determinants of Health (SDOH) Interventions    Readmission Risk Interventions    11/29/2021   10:32 AM  Readmission Risk Prevention Plan  Post Dischage Appt Complete  Medication Screening Complete  Transportation Screening Complete

## 2021-12-01 DIAGNOSIS — R338 Other retention of urine: Secondary | ICD-10-CM

## 2021-12-01 DIAGNOSIS — S7292XS Unspecified fracture of left femur, sequela: Secondary | ICD-10-CM | POA: Diagnosis not present

## 2021-12-01 DIAGNOSIS — D500 Iron deficiency anemia secondary to blood loss (chronic): Secondary | ICD-10-CM

## 2021-12-01 DIAGNOSIS — D62 Acute posthemorrhagic anemia: Secondary | ICD-10-CM

## 2021-12-01 DIAGNOSIS — E871 Hypo-osmolality and hyponatremia: Secondary | ICD-10-CM | POA: Diagnosis not present

## 2021-12-01 LAB — CBC
HCT: 28 % — ABNORMAL LOW (ref 36.0–46.0)
Hemoglobin: 9.2 g/dL — ABNORMAL LOW (ref 12.0–15.0)
MCH: 30.5 pg (ref 26.0–34.0)
MCHC: 32.9 g/dL (ref 30.0–36.0)
MCV: 92.7 fL (ref 80.0–100.0)
Platelets: 296 10*3/uL (ref 150–400)
RBC: 3.02 MIL/uL — ABNORMAL LOW (ref 3.87–5.11)
RDW: 14 % (ref 11.5–15.5)
WBC: 9.3 10*3/uL (ref 4.0–10.5)
nRBC: 0.3 % — ABNORMAL HIGH (ref 0.0–0.2)

## 2021-12-01 LAB — URINE CULTURE: Culture: 10000 — AB

## 2021-12-01 LAB — BASIC METABOLIC PANEL
Anion gap: 4 — ABNORMAL LOW (ref 5–15)
BUN: 8 mg/dL (ref 8–23)
CO2: 26 mmol/L (ref 22–32)
Calcium: 7.1 mg/dL — ABNORMAL LOW (ref 8.9–10.3)
Chloride: 100 mmol/L (ref 98–111)
Creatinine, Ser: 0.41 mg/dL — ABNORMAL LOW (ref 0.44–1.00)
GFR, Estimated: >60 mL/min (ref >60)
Glucose, Bld: 120 mg/dL — ABNORMAL HIGH (ref 70–99)
Potassium: 3.9 mmol/L (ref 3.5–5.1)
Sodium: 130 mmol/L — ABNORMAL LOW (ref 135–145)

## 2021-12-01 MED ORDER — FLEET ENEMA 7-19 GM/118ML RE ENEM
1.0000 | ENEMA | Freq: Once | RECTAL | Status: AC
  Administered 2021-12-01: 1 via RECTAL
  Filled 2021-12-01: qty 1

## 2021-12-01 MED ORDER — FENTANYL CITRATE (PF) 100 MCG/2ML IJ SOLN
INTRAMUSCULAR | Status: AC
  Filled 2021-12-01: qty 2

## 2021-12-01 MED ORDER — MIDAZOLAM HCL 2 MG/2ML IJ SOLN
INTRAMUSCULAR | Status: AC
  Filled 2021-12-01: qty 2

## 2021-12-01 MED ORDER — POLYETHYLENE GLYCOL 3350 17 G PO PACK
17.0000 g | PACK | Freq: Two times a day (BID) | ORAL | Status: DC
  Administered 2021-12-01 – 2021-12-02 (×2): 17 g via ORAL
  Filled 2021-12-01 (×3): qty 1

## 2021-12-01 NOTE — Care Management Important Message (Signed)
Important Message  Patient Details IM Letter given to the Patient. Name: Mary [name] MRN: [id number] Date of Birth: [birth year]   Medicare Important Message Given:  Yes     [name] 12/01/2021, 11:49 AM

## 2021-12-01 NOTE — Progress Notes (Signed)
[facility] PROGRESS NOTE   Mary [name] YSA:[id number] DOB: 1945/09/01 DOA: 11/26/2021  PCP: [name], DO  Brief History/Interval Summary: 76 y.o. 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  Patient had left hip replacement 4 days prior to this admission and had been doing well with physical therapy.   Found to have a fracture and underwent surgery 8/28.    Consultants: Orthopedics  Procedures: ORIF left periprosthetic femur fracture 8/28    Subjective/Interval History: Patient feels well.  Had difficulty urinating yesterday so Foley catheter had to be reinserted.  Pain is reasonably well controlled.  No new issues at this time.  No bowel movement recorded.    Assessment/Plan:  Left femoral fracture Status post ORIF on 8/28 Management per orthopedics.  PT and OT evaluation.  Pain control.     Urinary retention This is multifactorial including immobility, pain issues, recent fracture. She is allergic to sulfa drugs. Foley catheter reinserted yesterday.  Will need to be discharged with Foley catheter with voiding trial to be done once she is more mobile.  Acute blood loss anemia Hemoglobin responded to blood transfusion.  Hemoglobin is up to 9.2 today.  We will recheck tomorrow.  Constipation Remains constipated.  No bowel movements recorded.  Will give enema x1.  Hyponatremia Continue with normal saline infusion.  Stable.  History of bronchiectasis Pulmonary toilet.  Concern for UTI No significant growth on urine culture from 8/29.  ESBL E. coli noted on previous culture, but only 10,000 colonies were seen.  Significance of these findings is unclear.  Symptoms could have been due to retention rather than a true  UTI.  Will not pursue this further.  DVT Prophylaxis: On aspirin twice daily Code Status: Full code Family Communication: Discussed with patient and her son Disposition Plan: SNF when medically stable.  Anticipate discharge tomorrow.  Status is: Inpatient Remains inpatient appropriate because: Femur fracture, acute blood loss anemia      Medications: Scheduled:  sodium chloride   Intravenous Once   sodium chloride   Intravenous Once   acetaminophen  500 mg Oral Q8H   aspirin EC  81 mg Oral BID   Chlorhexidine Gluconate Cloth  6 each Topical Daily   gabapentin  100 mg Oral QHS   pantoprazole  40 mg Oral Q1200   polyethylene glycol  17 g Oral Daily   senna-docusate  2 tablet Oral BID   traMADol  50 mg Oral Q6H   Continuous:   ceFAZolin (ANCEF) IV Stopped (12/01/21 0530)   NAT:FTDDUKGURKYHC **OR** acetaminophen, albuterol, HYDROcodone-acetaminophen, HYDROmorphone (DILAUDID) injection, methocarbamol, ondansetron **OR** ondansetron (ZOFRAN) IV, mouth rinse, oxyCODONE, polyvinyl alcohol  Antibiotics: Anti-infectives (From admission, onward)    Start     Dose/Rate Route Frequency Ordered Stop   11/29/21 0815  cefTRIAXone (ROCEPHIN) 1 g in sodium chloride 0.9 % 100 mL IVPB  Status:  Discontinued        1 g 200 mL/hr over 30 Minutes Intravenous Every 24 hours 11/29/21 0725 11/29/21 0725   11/29/21 0000  ceFAZolin (ANCEF) IVPB 2g/100 mL premix  Status:  Discontinued        2 g 200 mL/hr over 30 Minutes Intravenous Every 6 hours 11/28/21 1817 11/28/21 1956   11/28/21 2200  ceFAZolin (ANCEF)  IVPB 2g/100 mL premix        2 g 200 mL/hr over 30 Minutes Intravenous Every 8 hours 11/28/21 1956 12/01/21 2159   11/28/21 1800  ceFAZolin (ANCEF) IVPB 2g/100 mL premix  Status:  Discontinued        2 g 200 mL/hr over 30 Minutes Intravenous Every 8 hours 11/28/21 1535 11/28/21 1817   11/28/21 1313  clarithromycin (BIAXIN) tablet 250 mg  Status:  Discontinued       Note to Pharmacy: Take 0.5  tablet (250 mg) by mouth twice daily for 7 days (one week out of each month every other month--rotating with augmentin antibiotic every other month)      250 mg Oral See admin instructions 11/28/21 1314 11/28/21 1523   11/28/21 0930  ceFAZolin (ANCEF) IVPB 2g/100 mL premix        2 g 200 mL/hr over 30 Minutes Intravenous On call to O.R. 11/28/21 0842 11/28/21 1119       Objective:  Vital Signs  Vitals:   11/30/21 1200 11/30/21 1433 11/30/21 1946 12/01/21 0452  BP: 114/71 (!) 132/50 132/61 (!) 140/61  Pulse: 84 89 97 91  Resp: 18 17 16 16   Temp: 98.2 F (36.8 C) 98.2 F (36.8 C) 98 F (36.7 C) 98.1 F (36.7 C)  TempSrc: Oral Oral Oral Oral  SpO2: 94% 97% 96% 92%  Weight:      Height:        Intake/Output Summary (Last 24 hours) at 12/01/2021 1024 Last data filed at 12/01/2021 0559 Gross per 24 hour  Intake 2841.31 ml  Output 2500 ml  Net 341.31 ml    Filed Weights   11/26/21 1411 11/27/21 0120 11/28/21 0914  Weight: 47.2 kg 50.5 kg 50.5 kg    General appearance: Awake alert.  In no distress Resp: Clear to auscultation bilaterally.  Normal effort Cardio: S1-S2 is normal regular.  No S3-S4.  No rubs murmurs or bruit GI: Abdomen is soft.  Nontender nondistended.  Bowel sounds are present normal.  No masses organomegaly Extremities: Swelling of the left lower extremity especially the thigh area. Neurologic: Alert and oriented x3.  No focal neurological deficits.     Lab Results:  Data Reviewed: I have personally reviewed following labs and reports of the imaging studies  CBC: Recent Labs  Lab 11/26/21 1817 11/27/21 0536 11/28/21 0458 11/29/21 0545 11/30/21 0525 12/01/21 0541  WBC 7.1 8.5 12.5* 13.3* 10.9* 9.3  NEUTROABS 5.6  --   --   --   --   --   HGB 10.8* 10.7* 9.7* 7.7* 7.1* 9.2*  HCT 32.4* 32.3* 30.8* 24.6* 21.8* 28.0*  MCV 92.8 92.6 96.9 96.1 94.8 92.7  PLT 226 268 243 275 271 296     Basic Metabolic Panel: Recent Labs  Lab 11/27/21 0536  11/28/21 0458 11/29/21 0545 11/30/21 0525 12/01/21 0541  NA 135 132* 131* 128* 130*  K 4.9 4.5 5.0 4.4 3.9  CL 105 103 102 99 100  CO2 23 23 24 25 26   GLUCOSE 114* 86 126* 99 120*  BUN 26* 26* 26* 22 8  CREATININE 0.60 0.54 0.58 0.45 0.41*  CALCIUM 8.2* 7.9* 7.6* 7.3* 7.1*  MG 2.2  --   --   --   --   PHOS 3.7  --   --   --   --      GFR: Estimated Creatinine Clearance: 47.7 mL/min (A) (by C-G formula based on SCr of 0.41 mg/dL (L)).  Liver  Function Tests: Recent Labs  Lab 11/27/21 0536  AST 38  ALT 26  ALKPHOS 100  BILITOT 1.2  PROT 6.2*  ALBUMIN 3.3*      Recent Results (from the past 240 hour(s))  Urine Culture     Status: Abnormal   Collection Time: 11/26/21  2:02 PM   Specimen: Urine, Clean Catch  Result Value Ref Range Status   Specimen Description   Final    URINE, CLEAN CATCH Performed at [facility], [address], [place], [place] [address]    Special Requests   Final    NONE Performed at [facility], [address] [place], [place] [address]    Culture (A)  Final    10,000 COLONIES/mL ESCHERICHIA COLI Confirmed Extended Spectrum Beta-Lactamase Producer (ESBL).  In bloodstream infections from ESBL organisms, carbapenems are preferred over piperacillin/tazobactam. They are shown to have a lower risk of mortality.    Report Status 12/01/2021 FINAL  Final   Organism ID, Bacteria ESCHERICHIA COLI (A)  Final      Susceptibility   Escherichia coli - MIC*    AMPICILLIN >=32 RESISTANT Resistant     CEFAZOLIN >=64 RESISTANT Resistant     CEFEPIME 16 RESISTANT Resistant     CEFTRIAXONE >=64 RESISTANT Resistant     CIPROFLOXACIN >=4 RESISTANT Resistant     GENTAMICIN <=1 SENSITIVE Sensitive     IMIPENEM <=0.25 SENSITIVE Sensitive     NITROFURANTOIN <=16 SENSITIVE Sensitive     TRIMETH/SULFA <=20 SENSITIVE Sensitive     AMPICILLIN/SULBACTAM >=32 RESISTANT Resistant     PIP/TAZO 8 SENSITIVE Sensitive     *  10,000 COLONIES/mL ESCHERICHIA COLI  Urine Culture     Status: Abnormal   Collection Time: 11/29/21  2:48 PM   Specimen: Urine, Random  Result Value Ref Range Status   Specimen Description   Final    URINE, RANDOM Performed at [facility], [address] [place], [place] [address]    Special Requests   Final    NONE Performed at [facility], [address], [place], [place] [address]    Culture (A)  Final    <10,000 COLONIES/mL INSIGNIFICANT GROWTH Performed at [facility], [address], [place], [place] [address]    Report Status 11/30/2021 FINAL  Final      Radiology Studies: No results found.     LOS: 5 days   Cadyn Rodger [employer] on [email or web address]  12/01/2021, 10:24 AM

## 2021-12-01 NOTE — Progress Notes (Signed)
Subjective: Patient doing okay today.  Son at bedside.  She notes some improvement in her ability to move and lift her leg but is a little disappointed with the slow progress thus far.  She was able to get in to get to the recliner yesterday.  Hemoglobin improved to 9.2 after blood transfusion.  Unfortunately was still having issues with urinary retention and Foley had to be reinserted.  Social work working on SNF/rehab placement.  Given moderate saturation of her dressing discussed plan to change dressing before discharge to rehab.  Objective:   VITALS:   Vitals:   11/30/21 1200 11/30/21 1433 11/30/21 1946 12/01/21 0452  BP: 114/71 (!) 132/50 132/61 (!) 140/61  Pulse: 84 89 97 91  Resp: 18 17 16 16  Temp: 98.2 F (36.8 C) 98.2 F (36.8 C) 98 F (36.7 C) 98.1 F (36.7 C)  TempSrc: Oral Oral Oral Oral  SpO2: 94% 97% 96% 92%  Weight:      Height:        Sensation intact distally Intact pulses distally Dorsiflexion/Plantar flexion intact Incision: Lateral Aquacel dressing with moderate strikethrough along the proximal aspect of the dressing. Compartment soft   Lab Results  Component Value Date   WBC 9.3 12/01/2021   HGB 9.2 (L) 12/01/2021   HCT 28.0 (L) 12/01/2021   MCV 92.7 12/01/2021   PLT 296 12/01/2021   BMET    Component Value Date/Time   NA 130 (L) 12/01/2021 0541   NA 136 11/29/2020 1007   K 3.9 12/01/2021 0541   CL 100 12/01/2021 0541   CO2 26 12/01/2021 0541   GLUCOSE 120 (H) 12/01/2021 0541   BUN 8 12/01/2021 0541   BUN 11 11/29/2020 1007   CREATININE 0.41 (L) 12/01/2021 0541   CREATININE 0.72 02/04/2014 0830   CALCIUM 7.1 (L) 12/01/2021 0541   EGFR 92 11/29/2020 1007   GFRNONAA >60 12/01/2021 0541      Xray: Postop x-rays demonstrate excellent alignment of revision femoral component and reduction of the periprosthetic fracture, restoration of leg lengths and offset.  No adverse features.  Assessment/Plan: 3 Days Post-Op   Principal  Problem:   Closed left femoral fracture (HCC) Active Problems:   Bronchiectasis (HCC)   S/P total left hip arthroplasty   Fall at home, initial encounter   Hyponatremia  S/p revision left hip arthroplasty femoral component only for periprosthetic left femur fracture 11/28/2021  Post op recs: WB: 20% partial weightbearing left lower extremity x6 weeks with posterior hip precautions, troch precautions: no active abduction Abx: Ancef 2 g every 8 in house or until at least postop day 3, plan for discharge on 7 days cefadroxil 500 twice daily Imaging: PACU pelvis Xray Dressing: Aquacell, will change dressing before discharge to rehab. DVT prophylaxis: Aspirin 81BID starting POD1 Follow up: 2 weeks after surgery for a wound check with Dr. Marchwiany at [facility].  Address: [address], [place], [place] [address]  Office Phone: [phone]      DANIEL A MARCHWIANY 12/01/2021, 7:20 AM   Daniel Marchwiany, MD  Contact information:   Weekdays 7am-5pm epic message Dr. Marchwiany, or call office for patient follow up: [phone] After hours and holidays please check [email or web address] for group call information for [facility]    

## 2021-12-01 NOTE — TOC Progression Note (Signed)
Transition of Care [facility]) - Progression Note    Patient Details  Name: Mary [name] MRN: [id number] Date of Birth: Mar 13, 1946  Transition of Care [facility]) CM/SW Contact  Otelia Santee, LCSW Phone Number: 12/01/2021, 9:40 AM  Clinical Narrative:    Pt's insurance authorization for SNF approved starting 12/02/21 to 12/06/21. Navi ID: [id number].    Expected Discharge Plan: Skilled Nursing Facility Barriers to Discharge: Continued Medical Work up  Expected Discharge Plan and Services Expected Discharge Plan: Skilled Nursing Facility In-house Referral: Clinical Social Work Discharge Planning Services: CM Consult Post Acute Care Choice: Home Health Living arrangements for the past 2 months: Single Family Home                 DME Arranged: N/A DME Agency: NA                   Social Determinants of Health (SDOH) Interventions    Readmission Risk Interventions    11/29/2021   10:32 AM  Readmission Risk Prevention Plan  Post Dischage Appt Complete  Medication Screening Complete  Transportation Screening Complete

## 2021-12-01 NOTE — Progress Notes (Signed)
Physical Therapy Treatment Patient Details Name: Mary [name] MRN: [id number] DOB: 03/30/1946 Today's Date: 12/01/2021   History of Present Illness Pt is a 76yo female  who had  L-THA, AA on 11/22/21, fell in bathroom resulting in L hip fx/periprosthetic fx.(Imaging shows a left hip comminuted nondisplaced fracture in the region of the  greater trochanter. There is also an oblique displaced fracture extending from the level of the greater trochanter to proximally 2 cm distal to the tip of the femoral prosthesis. This fracture is displaced distal to the level of the prosthesis with apex lateral angulation). orhto consulted and pt underwent L  hip ORIF + femoral component  revision on 11/28/21.  PMH: Anemia, brochiectasis, COPD, GERD, anterior cervical fusion 1992.    PT Comments    POD # 3 am session General Comments: AxO x 3 very pleasant Lady feeling better today.  HgB back up to 9.1.  Premedicated with 10mg  OXY one hour before session.  Happy to be "out of that bed". Assisted OOB to amb required increased time and + 2 assist.  General bed mobility comments: increased time and assist to guide L LE to transition from supine to EOB. General transfer comment: + 2 side by side support with extra assist under axillary to ensure limited WBing L LE then once upright assisted placement od B UE's onto B platform EVA walker.  Also + 2 side by side assit to control stand to sit.General Gait Details: using B platform EVA walker to ensure limited WBing thru L LE + 2 asisst with Spouse following with recliner.  Pt was able to amb 15 feet before c/o fatigue. Positioned in recliner.  Performed a few TE's followed by ICE. Pt will need ST Rehab at SNF to address mobility and functional decline prior to safely returning home.   Recommendations for follow up therapy are one component of a multi-disciplinary discharge planning process, led by the attending physician.  Recommendations may be updated based on patient  status, additional functional criteria and insurance authorization.  Follow Up Recommendations  Skilled nursing-short term rehab (<3 hours/day) Can patient physically be transported by private vehicle: No   Assistance Recommended at Discharge Frequent or constant Supervision/Assistance  Patient can return home with the following A little help with walking and/or transfers;Assistance with cooking/housework;Assist for transportation;Help with stairs or ramp for entrance;A lot of help with bathing/dressing/bathroom   Equipment Recommendations  None recommended by PT    Recommendations for Other Services       Precautions / Restrictions Precautions Precautions: Posterior Hip;Fall Precaution Comments: educated each visit on THP and PWB 20% Restrictions Weight Bearing Restrictions: Yes LLE Weight Bearing: Partial weight bearing LLE Partial Weight Bearing Percentage or Pounds: 20% Other Position/Activity Restrictions: NO ACTIVE ABDuction     Mobility  Bed Mobility Overal bed mobility: Needs Assistance Bed Mobility: Supine to Sit     Supine to sit: Mod assist, Max assist     General bed mobility comments: increased time and assist to guide L LE to transition from supine to EOB.    Transfers Overall transfer level: Needs assistance Equipment used: 2 person hand held assist Transfers: Sit to/from Stand Sit to Stand: Max assist, +2 physical assistance, +2 safety/equipment, From elevated surface           General transfer comment: + 2 side by side support with extra assist under axillary to ensure limited WBing L LE then once upright assisted placement od B UE's onto B platform EVA walker.  Also + 2 side by side assit to control stand to sit.    Ambulation/Gait Ambulation/Gait assistance: Max assist, +2 physical assistance, +2 safety/equipment Gait Distance (Feet): 15 Feet Assistive device: Bilateral platform walker, Ethelene Hal Gait Pattern/deviations: Step-to pattern,  Decreased stance time - left, Trunk flexed, Decreased step length - left, Decreased step length - right Gait velocity: decreased     General Gait Details: using B platform EVA walker to ensure limited WBing thru L LE + 2 asisst with Spouse following with recliner.  Pt was able to amb 15 feet before c/o fatigue.   Stairs             Wheelchair Mobility    Modified Rankin (Stroke Patients Only)       Balance                                            Cognition Arousal/Alertness: Awake/alert Behavior During Therapy: WFL for tasks assessed/performed Overall Cognitive Status: Within Functional Limits for tasks assessed                                 General Comments: AxO x 3 very pleasant Lady feeling better today.  HgB back up to 9.1.  Premedicated with 10mg  OXY one hour before session.  Happy to be "out of that bed".        Exercises   05 reps LAQ's 20 reps AP 10 reps knee presses 10 reps gluteal squeezes 10 reps HS using belt    General Comments        Pertinent Vitals/Pain Pain Assessment Pain Assessment: 0-10 Pain Score: 3  Pain Location: left hip Pain Descriptors / Indicators: Operative site guarding, Grimacing, Sore, Tender Pain Intervention(s): Monitored during session, Premedicated before session, Repositioned, Ice applied    Home Living                          Prior Function            PT Goals (current goals can now be found in the care plan section) Progress towards PT goals: Progressing toward goals    Frequency    Min 4X/week      PT Plan Current plan remains appropriate    Co-evaluation              AM-PAC PT "6 Clicks" Mobility   Outcome Measure  Help needed turning from your back to your side while in a flat bed without using bedrails?: A Lot Help needed moving from lying on your back to sitting on the side of a flat bed without using bedrails?: A Lot Help needed moving to  and from a bed to a chair (including a wheelchair)?: A Lot Help needed standing up from a chair using your arms (e.g., wheelchair or bedside chair)?: A Lot Help needed to walk in hospital room?: A Lot Help needed climbing 3-5 steps with a railing? : Total 6 Click Score: 11    End of Session Equipment Utilized During Treatment: Gait belt Activity Tolerance: Patient limited by fatigue;Patient limited by pain Patient left: in chair;with call bell/phone within reach Nurse Communication: Mobility status PT Visit Diagnosis: Pain;Difficulty in walking, not elsewhere classified (R26.2) Pain - Right/Left: Left Pain - part of body: Hip  Time: 1010-1034 PT Time Calculation (min) (ACUTE ONLY): 24 min  Charges:  $Gait Training: 8-22 mins $Therapeutic Exercise: 8-22 mins                    Felecia Shelling  PTA Acute  Rehabilitation Services Office M-F          [phone] Weekend pager [phone]

## 2021-12-01 NOTE — Plan of Care (Signed)

## 2021-12-01 NOTE — Progress Notes (Signed)
Physical Therapy Treatment Patient Details Name: SHADIE SWEATMAN MRN: [id number] DOB: 06-25-1945 Today's Date: 12/01/2021   History of Present Illness Pt is a 76yo female  who had  L-THA, AA on 11/22/21, fell in bathroom resulting in L hip fx/periprosthetic fx.(Imaging shows a left hip comminuted nondisplaced fracture in the region of the  greater trochanter. There is also an oblique displaced fracture extending from the level of the greater trochanter to proximally 2 cm distal to the tip of the femoral prosthesis. This fracture is displaced distal to the level of the prosthesis with apex lateral angulation). orhto consulted and pt underwent L  hip ORIF + femoral component  revision on 11/28/21.  PMH: Anemia, brochiectasis, COPD, GERD, anterior cervical fusion 1992.    PT Comments    POD # 3 pm session Pt tolerated sitting in recliner from 10:30 am till 1:45 pm but with increased c/o "muscle Spasms".  Pt c/o tightness with increased pain despite getting pain med 45 min to pm session.  Required + 2 assist from recliner to [facility].  Sitting on BSC > 15 min with difficulty having a "good BM".  Only 2 small, hard marble size balls came out.  RN called to room and assisted with sit to stand for Enema then back to [facility].  Call light in reach and Spouse at bedside when Therapist exited room.   Recommendations for follow up therapy are one component of a multi-disciplinary discharge planning process, led by the attending physician.  Recommendations may be updated based on patient status, additional functional criteria and insurance authorization.  Follow Up Recommendations  Skilled nursing-short term rehab (<3 hours/day) Can patient physically be transported by private vehicle: No   Assistance Recommended at Discharge Frequent or constant Supervision/Assistance  Patient can return home with the following A little help with walking and/or transfers;Assistance with cooking/housework;Assist for  transportation;Help with stairs or ramp for entrance;A lot of help with bathing/dressing/bathroom   Equipment Recommendations  None recommended by PT    Recommendations for Other Services       Precautions / Restrictions Precautions Precautions: Posterior Hip;Fall Precaution Comments: educated each visit on THP and PWB 20% Restrictions Weight Bearing Restrictions: Yes LLE Weight Bearing: Partial weight bearing LLE Partial Weight Bearing Percentage or Pounds: 20% Other Position/Activity Restrictions: NO ACTIVE ABDuction     Mobility  Bed Mobility   General bed mobility comments: OOB in recliner then to [facility]    Transfers Overall transfer level: Needs assistance Equipment used: 2 person hand held assist Transfers: Sit to/from Stand Sit to Stand: Max assist, +2 physical assistance, +2 safety/equipment, From elevated surface           General transfer comment: + 2 assist from recliner to [facility] with 50% VC's to avoid hip flex >90 degrees and safety with turns.    Ambulation/Gait     General Gait Details: transfer only this session due to need to have a BM   Stairs             Wheelchair Mobility    Modified Rankin (Stroke Patients Only)       Balance                                            Cognition Arousal/Alertness: Awake/alert Behavior During Therapy: WFL for tasks assessed/performed Overall Cognitive Status: Within Functional Limits for tasks assessed  General Comments: AxO x 3 very pleasant Lady with increased c/o nausea and inability to have a "good" BM        Exercises      General Comments        Pertinent Vitals/Pain Pain Assessment Pain Assessment: 0-10 Pain Score: 7  Pain Location: left hip Pain Descriptors / Indicators: Cramping, Tightness Pain Intervention(s): Monitored during session, Premedicated before session, Repositioned, Ice applied    Home Living                           Prior Function            PT Goals (current goals can now be found in the care plan section) Progress towards PT goals: Progressing toward goals    Frequency    Min 4X/week      PT Plan Current plan remains appropriate    Co-evaluation              AM-PAC PT "6 Clicks" Mobility   Outcome Measure  Help needed turning from your back to your side while in a flat bed without using bedrails?: A Lot Help needed moving from lying on your back to sitting on the side of a flat bed without using bedrails?: A Lot Help needed moving to and from a bed to a chair (including a wheelchair)?: A Lot Help needed standing up from a chair using your arms (e.g., wheelchair or bedside chair)?: A Lot Help needed to walk in hospital room?: A Lot Help needed climbing 3-5 steps with a railing? : Total 6 Click Score: 11    End of Session Equipment Utilized During Treatment: Gait belt Activity Tolerance: Patient limited by fatigue;Patient limited by pain Patient left: Other (comment) (BSC) Nurse Communication: Mobility status PT Visit Diagnosis: Pain;Difficulty in walking, not elsewhere classified (R26.2) Pain - Right/Left: Left Pain - part of body: Hip     Time: 1350-1425 PT Time Calculation (min) (ACUTE ONLY): 35 min  Charges:  $Therapeutic Activity: 23-37 mins                     Felecia Shelling  PTA Acute  Rehabilitation Services Office M-F          [phone] Weekend pager [phone]

## 2021-12-02 DIAGNOSIS — R5381 Other malaise: Secondary | ICD-10-CM | POA: Diagnosis not present

## 2021-12-02 DIAGNOSIS — S72302A Unspecified fracture of shaft of left femur, initial encounter for closed fracture: Secondary | ICD-10-CM | POA: Diagnosis not present

## 2021-12-02 DIAGNOSIS — S7292XD Unspecified fracture of left femur, subsequent encounter for closed fracture with routine healing: Secondary | ICD-10-CM | POA: Diagnosis not present

## 2021-12-02 DIAGNOSIS — R338 Other retention of urine: Secondary | ICD-10-CM | POA: Diagnosis not present

## 2021-12-02 DIAGNOSIS — Z96642 Presence of left artificial hip joint: Secondary | ICD-10-CM | POA: Diagnosis not present

## 2021-12-02 DIAGNOSIS — K5900 Constipation, unspecified: Secondary | ICD-10-CM | POA: Diagnosis not present

## 2021-12-02 DIAGNOSIS — M6281 Muscle weakness (generalized): Secondary | ICD-10-CM | POA: Diagnosis not present

## 2021-12-02 DIAGNOSIS — S7292XA Unspecified fracture of left femur, initial encounter for closed fracture: Secondary | ICD-10-CM

## 2021-12-02 DIAGNOSIS — G608 Other hereditary and idiopathic neuropathies: Secondary | ICD-10-CM | POA: Diagnosis not present

## 2021-12-02 DIAGNOSIS — E871 Hypo-osmolality and hyponatremia: Secondary | ICD-10-CM | POA: Diagnosis not present

## 2021-12-02 DIAGNOSIS — E559 Vitamin D deficiency, unspecified: Secondary | ICD-10-CM | POA: Diagnosis not present

## 2021-12-02 DIAGNOSIS — R4181 Age-related cognitive decline: Secondary | ICD-10-CM | POA: Diagnosis not present

## 2021-12-02 DIAGNOSIS — K219 Gastro-esophageal reflux disease without esophagitis: Secondary | ICD-10-CM | POA: Diagnosis not present

## 2021-12-02 DIAGNOSIS — R2689 Other abnormalities of gait and mobility: Secondary | ICD-10-CM | POA: Diagnosis not present

## 2021-12-02 DIAGNOSIS — M160 Bilateral primary osteoarthritis of hip: Secondary | ICD-10-CM | POA: Diagnosis not present

## 2021-12-02 DIAGNOSIS — W19XXXD Unspecified fall, subsequent encounter: Secondary | ICD-10-CM | POA: Diagnosis not present

## 2021-12-02 DIAGNOSIS — I10 Essential (primary) hypertension: Secondary | ICD-10-CM | POA: Diagnosis not present

## 2021-12-02 DIAGNOSIS — M978XXA Periprosthetic fracture around other internal prosthetic joint, initial encounter: Secondary | ICD-10-CM | POA: Diagnosis not present

## 2021-12-02 DIAGNOSIS — M8000XA Age-related osteoporosis with current pathological fracture, unspecified site, initial encounter for fracture: Secondary | ICD-10-CM | POA: Diagnosis not present

## 2021-12-02 DIAGNOSIS — D500 Iron deficiency anemia secondary to blood loss (chronic): Secondary | ICD-10-CM | POA: Diagnosis not present

## 2021-12-02 DIAGNOSIS — S79929A Unspecified injury of unspecified thigh, initial encounter: Secondary | ICD-10-CM | POA: Diagnosis not present

## 2021-12-02 DIAGNOSIS — I509 Heart failure, unspecified: Secondary | ICD-10-CM | POA: Diagnosis not present

## 2021-12-02 DIAGNOSIS — E441 Mild protein-calorie malnutrition: Secondary | ICD-10-CM | POA: Diagnosis not present

## 2021-12-02 DIAGNOSIS — J479 Bronchiectasis, uncomplicated: Secondary | ICD-10-CM | POA: Diagnosis not present

## 2021-12-02 DIAGNOSIS — S7292XS Unspecified fracture of left femur, sequela: Secondary | ICD-10-CM | POA: Diagnosis not present

## 2021-12-02 LAB — BASIC METABOLIC PANEL
Anion gap: 5 (ref 5–15)
BUN: 7 mg/dL — ABNORMAL LOW (ref 8–23)
CO2: 27 mmol/L (ref 22–32)
Calcium: 7.6 mg/dL — ABNORMAL LOW (ref 8.9–10.3)
Chloride: 99 mmol/L (ref 98–111)
Creatinine, Ser: 0.43 mg/dL — ABNORMAL LOW (ref 0.44–1.00)
GFR, Estimated: >60 mL/min (ref >60)
Glucose, Bld: 120 mg/dL — ABNORMAL HIGH (ref 70–99)
Potassium: 4.3 mmol/L (ref 3.5–5.1)
Sodium: 131 mmol/L — ABNORMAL LOW (ref 135–145)

## 2021-12-02 LAB — TYPE AND SCREEN
ABO/RH(D): B POS
Antibody Screen: NEGATIVE
Unit division: 0
Unit division: 0

## 2021-12-02 LAB — BPAM RBC
Blood Product Expiration Date: 202309052359
Blood Product Expiration Date: 202309202359
ISSUE DATE / TIME: 202308301130
Unit Type and Rh: 7300
Unit Type and Rh: 7300

## 2021-12-02 LAB — CBC
HCT: 31.9 % — ABNORMAL LOW (ref 36.0–46.0)
Hemoglobin: 10.4 g/dL — ABNORMAL LOW (ref 12.0–15.0)
MCH: 30.9 pg (ref 26.0–34.0)
MCHC: 32.6 g/dL (ref 30.0–36.0)
MCV: 94.7 fL (ref 80.0–100.0)
Platelets: 350 10*3/uL (ref 150–400)
RBC: 3.37 MIL/uL — ABNORMAL LOW (ref 3.87–5.11)
RDW: 14.4 % (ref 11.5–15.5)
WBC: 10.4 10*3/uL (ref 4.0–10.5)
nRBC: 0.3 % — ABNORMAL HIGH (ref 0.0–0.2)

## 2021-12-02 MED ORDER — ASPIRIN 81 MG PO TBEC: 81.0000 mg | DELAYED_RELEASE_TABLET | Freq: Two times a day (BID) | ORAL | 12 refills | Status: DC

## 2021-12-02 MED ORDER — CEFADROXIL 500 MG PO CAPS: 500.0000 mg | ORAL_CAPSULE | Freq: Two times a day (BID) | ORAL | 0 refills | Status: AC

## 2021-12-02 MED ORDER — METHOCARBAMOL 500 MG PO TABS: 500.0000 mg | ORAL_TABLET | Freq: Four times a day (QID) | ORAL | Status: DC | PRN

## 2021-12-02 MED ORDER — ENSURE ENLIVE PO LIQD: 237.0000 mL | Freq: Three times a day (TID) | ORAL | 12 refills | Status: DC

## 2021-12-02 MED ORDER — SENNOSIDES-DOCUSATE SODIUM 8.6-50 MG PO TABS: 2.0000 | ORAL_TABLET | Freq: Two times a day (BID) | ORAL | Status: DC

## 2021-12-02 MED ORDER — CEFADROXIL 500 MG PO CAPS
500.0000 mg | ORAL_CAPSULE | Freq: Two times a day (BID) | ORAL | Status: DC
  Administered 2021-12-02: 500 mg via ORAL
  Filled 2021-12-02: qty 1

## 2021-12-02 MED ORDER — HYDROCODONE-ACETAMINOPHEN 5-325 MG PO TABS: 1.0000 | ORAL_TABLET | ORAL | 0 refills | Status: DC | PRN

## 2021-12-02 MED ORDER — POLYETHYLENE GLYCOL 3350 17 G PO PACK: 17.0000 g | PACK | Freq: Two times a day (BID) | ORAL | 0 refills | Status: DC

## 2021-12-02 MED ORDER — PANTOPRAZOLE SODIUM 40 MG PO TBEC: 40.0000 mg | DELAYED_RELEASE_TABLET | Freq: Every day | ORAL | Status: DC

## 2021-12-02 MED ORDER — ENSURE ENLIVE PO LIQD: 237.0000 mL | Freq: Three times a day (TID) | ORAL | Status: DC

## 2021-12-02 MED ORDER — ACETAMINOPHEN 500 MG PO TABS: 500.0000 mg | ORAL_TABLET | Freq: Three times a day (TID) | ORAL | 0 refills | Status: AC

## 2021-12-02 NOTE — TOC Transition Note (Signed)
Transition of Care [facility]) - CM/SW Discharge Note   Patient Details  Name: Mary [name] MRN: [id number] Date of Birth: 1945/08/06  Transition of [facility]) CM/SW Contact:  Otelia Santee, LCSW Phone Number: 12/02/2021, 9:40 AM   Clinical Narrative:    Pt is to transfer to SNF at [facility] room [phone]. RN to call report to [phone]. Pt will be transported to facility via [facility]. Pt and spouse in agreeance with transfer.    Final next level of care: Skilled Nursing Facility Barriers to Discharge: Barriers Resolved   Patient Goals and CMS Choice Patient states their goals for this hospitalization and ongoing recovery are:: "To return home"   Choice offered to / list presented to : Patient  Discharge Placement PASRR number recieved: 11/29/21            Patient chooses bed at: [facility] Patient to be transferred to facility by: [facility] Name of family member notified: Attempted to contact spouse, Tommie Meinzer; Pt aware of transfer Patient and family notified of of transfer: 12/02/21  Discharge Plan and Services In-house Referral: Clinical Social Work Discharge Planning Services: CM Consult Post Acute Care Choice: Home Health          DME Arranged: N/A DME Agency: NA                  Social Determinants of Health (SDOH) Interventions     Readmission Risk Interventions    12/02/2021    9:37 AM 11/29/2021   10:32 AM  Readmission Risk Prevention Plan  Post Dischage Appt Complete Complete  Medication Screening Complete Complete  Transportation Screening Complete Complete

## 2021-12-02 NOTE — Progress Notes (Signed)
     Subjective: Patient doing okay today.  Husband at bedside. Hgb 10.4. Complaining mostly of muscle spacm, she states that muscle relaxant helping but wears off too quickly. I changed frequency of robaxin from q8 to q6 PRN. No other issues. Awaiting rehab placement.  Objective:   VITALS:   Vitals:   12/01/21 0452 12/01/21 1505 12/01/21 2029 12/02/21 0325  BP: (!) 140/61 136/64 136/61 (!) 143/75  Pulse: 91 75 90 92  Resp: $Remo'16 18 16 18  'frrhx$ Temp: 98.1 F (36.7 C) 97.9 F (36.6 C) 98.1 F (36.7 C) 98 F (36.7 C)  TempSrc: Oral Oral Oral Oral  SpO2: 92% 97% 94% 94%  Weight:      Height:        Sensation intact distally Intact pulses distally Dorsiflexion/Plantar flexion intact Incision: dressing changed today. Staples intact. No active drainage. Moderate serosang drainage on dressing. New aquacell applied. No erythema. Compartment soft   Lab Results  Component Value Date   WBC 10.4 12/02/2021   HGB 10.4 (L) 12/02/2021   HCT 31.9 (L) 12/02/2021   MCV 94.7 12/02/2021   PLT 350 12/02/2021   BMET    Component Value Date/Time   NA 130 (L) 12/01/2021 0541   NA 136 11/29/2020 1007   K 3.9 12/01/2021 0541   CL 100 12/01/2021 0541   CO2 26 12/01/2021 0541   GLUCOSE 120 (H) 12/01/2021 0541   BUN 8 12/01/2021 0541   BUN 11 11/29/2020 1007   CREATININE 0.41 (L) 12/01/2021 0541   CREATININE 0.72 02/04/2014 0830   CALCIUM 7.1 (L) 12/01/2021 0541   EGFR 92 11/29/2020 1007   GFRNONAA >60 12/01/2021 0541      Xray: Postop x-rays demonstrate excellent alignment of revision femoral component and reduction of the periprosthetic fracture, restoration of leg lengths and offset.  No adverse features.  Assessment/Plan: 4 Days Post-Op   Principal Problem:   Closed left femoral fracture (HCC) Active Problems:   Bronchiectasis (HCC)   S/P total left hip arthroplasty   Fall at home, initial encounter   Hyponatremia  S/p revision left hip arthroplasty femoral component only for  periprosthetic left femur fracture 11/28/2021  Post op recs: WB: 20% partial weightbearing left lower extremity x6 weeks with posterior hip precautions, troch precautions: no active abduction Abx: Ancef 2 g every 8 in house or until at least postop day 3, plan for discharge on 7 days cefadroxil 500 twice daily Imaging: PACU pelvis Xray Dressing:  New aquacell applied 9/1. Monitor for drainage. Would consider prevena wound vac if continues to have strikethrough on new dressing DVT prophylaxis: Aspirin 81BID starting POD1 Follow up: 1 week after surgery for a wound check with Dr. Zachery Dakins at [facility].  Address: [address] [place], [place], [place] [address]  Office Phone: [phone]      Willaim Sheng 12/02/2021, 6:38 AM   Charlies Constable, MD  Contact information:   [phone] 7am-5pm epic message Dr. Zachery Dakins, or call office for patient follow up: [phone] After hours and holidays please check [email or web address] for group call information for [facility]

## 2021-12-02 NOTE — Final Progress Note (Signed)
Pt discharged to [facility].  Pt left with all belongings, no questions or concerns at time of departure, transported by [facility].  IV removed without complications, foley left intact d/t urinary retention.  Report called to [facility], taken by [facility] at [facility] LPN

## 2021-12-02 NOTE — Discharge Summary (Signed)
[facility]  Physician Discharge Summary   Patient ID: Mary RoyaltySandra M [name] MRN: [id number] DOB/AGE: October 14, 1945 76 y.o.  Admit date: 11/26/2021 Discharge date:   12/02/2021   PCP: Tommie Samsook, Jayce G, DO  DISCHARGE DIAGNOSES:    Closed left femoral fracture (HCC)   Bronchiectasis (HCC)   S/P total left hip arthroplasty   Hyponatremia   RECOMMENDATIONS FOR OUTPATIENT FOLLOW UP: Please resume Mary [name] Augmentin alternating with Biaxin only after 7-day course of cefadroxil has been completed. Check CBC and basic metabolic panel in 3 to 4 days Patient will need follow-up with orthopedic surgeon in 1 week Voiding trial once the patient is more mobile.   Home Health: Going to SNF Equipment/Devices: None  CODE STATUS: Full code  DISCHARGE CONDITION: fair  Diet recommendation: Regular as tolerated  INITIAL HISTORY: 76 y.o. 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  Patient had left hip replacement 4 days prior to this admission and had been doing well with physical therapy.   Found to have a fracture and underwent surgery 8/28.     Consultants: Orthopedics   Procedures: ORIF left periprosthetic femur fracture 8/28.  Blood transfusion    HOSPITAL COURSE:   Left femoral fracture Status post ORIF on 8/28 Seen by PT and OT.  Skilled nursing facility is recommended for rehab.   Urinary retention This is multifactorial including immobility, pain issues, recent fracture. She is allergic to sulfa drugs. Foley catheter reinserted yesterday.  Will need to be discharged with Foley catheter with voiding trial to be done once she is more mobile.   Acute blood loss anemia Hemoglobin responded to blood transfusion.  Hemoglobin is 10.4 today.    Constipation Resolved with the enema.  We will continue with scheduled laxatives.   Hyponatremia Sodium level is stable.  Recheck at the skilled nursing facility.   History of bronchiectasis Pulmonary toilet.  On long-term antibiotics rotating between clarithromycin and Augmentin.  Each of them is taken for 1 week every month.  Followed by Dr. Delton CoombesByrum.   Concern for UTI No significant growth on urine culture from 8/29.  ESBL E. coli noted on previous culture, but only 10,000 colonies were seen.  Significance of these findings is unclear.  Symptoms could have been due to retention rather than a true UTI.  Will not pursue this further.   Patient is stable.  Okay for discharge to skilled nursing facility today for short-term rehab.   PERTINENT LABS:  The results of significant diagnostics from this hospitalization (including imaging, microbiology, ancillary and laboratory) are listed below for reference.    Microbiology: Recent Results (from the past 240 hour(s))  Urine Culture     Status: Abnormal   Collection Time: 11/26/21  2:02 PM   Specimen: Urine, Clean Catch  Result Value Ref Range Status   Specimen Description   Final    URINE, CLEAN CATCH Performed at [facility], [address] [place], [place] [address]    Special Requests   Final    NONE Performed at [facility], [address] [place], [place] [address]    Culture (A)  Final    10,000 COLONIES/mL ESCHERICHIA COLI Confirmed Extended Spectrum Beta-Lactamase Producer (ESBL).  In bloodstream infections from ESBL organisms, carbapenems are preferred  over piperacillin/tazobactam. They are shown to have a lower risk of mortality.    Report Status 12/01/2021 FINAL  Final   Organism ID, Bacteria ESCHERICHIA COLI (A)  Final      Susceptibility   Escherichia coli - MIC*    AMPICILLIN >=32 RESISTANT Resistant     CEFAZOLIN >=64 RESISTANT Resistant     CEFEPIME 16 RESISTANT Resistant      CEFTRIAXONE >=64 RESISTANT Resistant     CIPROFLOXACIN >=4 RESISTANT Resistant     GENTAMICIN <=1 SENSITIVE Sensitive     IMIPENEM <=0.25 SENSITIVE Sensitive     NITROFURANTOIN <=16 SENSITIVE Sensitive     TRIMETH/SULFA <=20 SENSITIVE Sensitive     AMPICILLIN/SULBACTAM >=32 RESISTANT Resistant     PIP/TAZO 8 SENSITIVE Sensitive     * 10,000 COLONIES/mL ESCHERICHIA COLI  Urine Culture     Status: Abnormal   Collection Time: 11/29/21  2:48 PM   Specimen: Urine, Random  Result Value Ref Range Status   Specimen Description   Final    URINE, RANDOM Performed at [facility], [address], [place], [place] [address]    Special Requests   Final    NONE Performed at [facility], [address], [place], [place] [address]    Culture (A)  Final    <10,000 COLONIES/mL INSIGNIFICANT GROWTH Performed at [facility], [address] [place], [place] [address]    Report Status 11/30/2021 FINAL  Final     Labs:   Basic Metabolic Panel: Recent Labs  Lab 11/27/21 0536 11/28/21 0458 11/29/21 0545 11/30/21 0525 12/01/21 0541 12/02/21 0558  NA 135 132* 131* 128* 130* 131*  K 4.9 4.5 5.0 4.4 3.9 4.3  CL 105 103 102 99 100 99  CO2 GLUCOSE 114* 86 126* 99 120* 120*  BUN 26* 26* 26* 22 8 7*  CREATININE 0.60 0.54 0.58 0.45 0.41* 0.43*  CALCIUM 8.2* 7.9* 7.6* 7.3* 7.1* 7.6*  MG 2.2  --   --   --   --   --   PHOS 3.7  --   --   --   --   --    Liver Function Tests: Recent Labs  Lab 11/27/21 0536  AST 38  ALT 26  ALKPHOS 100  BILITOT 1.2  PROT 6.2*  ALBUMIN 3.3*    CBC: Recent Labs  Lab 11/26/21 1817 11/27/21 0536 11/28/21 0458 11/29/21 0545 11/30/21 0525 12/01/21 0541 12/02/21 0558  WBC 7.1   < > 12.5* 13.3* 10.9* 9.3 10.4  NEUTROABS 5.6  --   --   --   --   --   --   HGB 10.8*   < > 9.7* 7.7* 7.1* 9.2* 10.4*  HCT 32.4*   < > 30.8* 24.6* 21.8* 28.0* 31.9*  MCV 92.8   < > 96.9 96.1 94.8 92.7 94.7   PLT 226   < > 243 275 271 296 350   < > = values in this interval not displayed.     IMAGING STUDIES DG HIP UNILAT W OR W/O PELVIS 2-3 VIEWS LEFT  Result Date: 11/28/2021 CLINICAL DATA:  Fracture left femur EXAM: DG HIP (WITH OR WITHOUT PELVIS) 2-3V LEFT COMPARISON:  11/26/2021 FINDINGS: There is interval revision of left hip arthroplasty. There is internal fixation of fracture of shaft of left femur. There is marked improvement in alignment of fracture fragments. Skin staples are noted. Degenerative changes are noted in  right hip. IMPRESSION: Interval reduction and internal fixation of displaced fracture of shaft of left femur. There is interval revision of left hip arthroplasty. Electronically Signed   By: Ernie Avena M.D.   On: 11/28/2021 14:51   DG HIP UNILAT WITH PELVIS 1V LEFT  Result Date: 11/28/2021 CLINICAL DATA:  Left hip arthroplasty revision EXAM: DG HIP (WITH OR WITHOUT PELVIS) 1V*L*; DG C-ARM 1-60 MIN-NO REPORT COMPARISON:  11/26/2021 FLUOROSCOPY: Air kerma 1.41 mGy FINDINGS: Intraoperative fluoroscopic images of the left hip demonstrate total arthroplasty revision with multiple cerclage wires. IMPRESSION: Intraoperative fluoroscopic images demonstrate left hip arthroplasty revision. Electronically Signed   By: Jearld Lesch M.D.   On: 11/28/2021 13:30   DG C-Arm 1-60 Min-No Report  Result Date: 11/28/2021 Fluoroscopy was utilized by the requesting physician.  No radiographic interpretation.   DG C-Arm 1-60 Min-No Report  Result Date: 11/28/2021 Fluoroscopy was utilized by the requesting physician.  No radiographic interpretation.   DG Hip Unilat W or Wo Pelvis 2-3 Views Left  Result Date: 11/26/2021 CLINICAL DATA:  Left hip replacement four days ago, fell today with increasing pain. EXAM: DG HIP (WITH OR WITHOUT PELVIS) 2-3V LEFT; LEFT FEMUR 2 VIEWS COMPARISON:  CT left femur earlier today. FINDINGS: There is osteopenia.  Left hip arthroplasty again noted. No hardware  dislocation. Comminuted proximal left femoral fracture again is seen. This consists of a longitudinal fracture through the greater trochanter alongside the hardware, with the fragment displaced 6 mm laterally and slightly cephalad displaced, and an oblique proximal shaft fracture alongside the distal femoral hardware stem. The main distal fracture fragment is angled medially, laterally displaced by about 1 cortex width and with dorsal overriding of up to 7.5 cm on some images. There is a linear track of soft tissue air at the fat muscle interface in the upper thigh which is probably residual from the hip replacement surgery. Moderately advanced right hip DJD is incidentally noted, and healed fracture deformities of the left pubic rami. IMPRESSION: Perihardware proximal left femoral fractures with distal fragment dorsal overriding and medial angulation, with the greater trochanter fracture only slightly displaced. Osteopenia and degenerative change. No dislocation. Electronically Signed   By: Almira Bar M.D.   On: 11/26/2021 20:19   DG Femur Min 2 Views Left  Result Date: 11/26/2021 CLINICAL DATA:  Left hip replacement four days ago, fell today with increasing pain. EXAM: DG HIP (WITH OR WITHOUT PELVIS) 2-3V LEFT; LEFT FEMUR 2 VIEWS COMPARISON:  CT left femur earlier today. FINDINGS: There is osteopenia.  Left hip arthroplasty again noted. No hardware dislocation. Comminuted proximal left femoral fracture again is seen. This consists of a longitudinal fracture through the greater trochanter alongside the hardware, with the fragment displaced 6 mm laterally and slightly cephalad displaced, and an oblique proximal shaft fracture alongside the distal femoral hardware stem. The main distal fracture fragment is angled medially, laterally displaced by about 1 cortex width and with dorsal overriding of up to 7.5 cm on some images. There is a linear track of soft tissue air at the fat muscle interface in the upper  thigh which is probably residual from the hip replacement surgery. Moderately advanced right hip DJD is incidentally noted, and healed fracture deformities of the left pubic rami. IMPRESSION: Perihardware proximal left femoral fractures with distal fragment dorsal overriding and medial angulation, with the greater trochanter fracture only slightly displaced. Osteopenia and degenerative change. No dislocation. Electronically Signed   By: Almira Bar M.D.   On: 11/26/2021 20:19  CT FEMUR LEFT WO CONTRAST  Result Date: 11/26/2021 CLINICAL DATA:  Hip trauma. EXAM: CT OF THE LOWER LEFT EXTREMITY WITHOUT CONTRAST TECHNIQUE: Multidetector CT imaging of the lower left extremity was performed according to the standard protocol. RADIATION DOSE REDUCTION: This exam was performed according to the departmental dose-optimization program which includes automated exposure control, adjustment of the mA and/or kV according to patient size and/or use of iterative reconstruction technique. COMPARISON:  None Available. FINDINGS: Bones/Joint/Cartilage There is a left hip arthroplasty present in anatomic alignment. There is a comminuted nondisplaced fracture in the region of the greater trochanter. There is oblique displaced fracture extending from the level of the greater trochanter two proximally 2 cm distal to the tip of the femoral prosthesis. This fracture is displaced distal to the level of the prosthesis with apex lateral angulation and 4.5 cm of overlap. There is no dislocation. Ligaments Suboptimally assessed by CT. Muscles and Tendons There is intramuscular edema surrounding the fracture sites. There is a small amount of intramuscular air anterior to the hip joint. Soft tissues There is subcutaneous edema of the anterior thigh. Bladder is markedly distended. IMPRESSION: 1. Left hip arthroplasty appears in anatomic alignment. 2. Acute comminuted fracture of the mid and proximal femur from the level of the greater  trochanter to the level of the mid diaphysis. 2 cm distal to the femoral component of the arthroplasty there is a displaced angulated fracture with overlap. Electronically Signed   By: Darliss Cheney M.D.   On: 11/26/2021 18:38   CT Hip Right Wo Contrast  Result Date: 11/26/2021 CLINICAL DATA:  Fall, right hip pain EXAM: CT OF THE RIGHT HIP WITHOUT CONTRAST TECHNIQUE: Multidetector CT imaging of the right hip was performed according to the standard protocol. Multiplanar CT image reconstructions were also generated. RADIATION DOSE REDUCTION: This exam was performed according to the departmental dose-optimization program which includes automated exposure control, adjustment of the mA and/or kV according to patient size and/or use of iterative reconstruction technique. COMPARISON:  Pelvic radiograph dated 11/26/2021 FINDINGS: No fracture or dislocation is seen. Moderate degenerative changes of the right hip. Visualized bony pelvis appears intact. Visualized soft tissue pelvis is unremarkable, noting bladder distension and colonic diverticulosis. IMPRESSION: Moderate degenerative changes of the right hip. No fracture or dislocation is seen. Electronically Signed   By: Charline Bills M.D.   On: 11/26/2021 18:31   DG Pelvis 1-2 Views  Result Date: 11/26/2021 CLINICAL DATA:  Status post left hip arthroplasty. EXAM: PELVIS - 1 VIEW COMPARISON:  None Available. FINDINGS: Exam is technically suboptimal due to patient's inability to cooperate with positioning. A bipolar left hip prosthesis is seen in expected position. Old fracture deformity of left superior pubic ramus noted. Severe right hip osteoarthritis also seen. IMPRESSION: Technically suboptimal exam. Bipolar left hip prosthesis in expected position. Severe right hip osteoarthritis. Electronically Signed   By: Danae Orleans M.D.   On: 11/26/2021 15:49   DG HIP UNILAT WITH PELVIS 1V LEFT  Result Date: 11/22/2021 CLINICAL DATA:  Fluoroscopic assistance for  left hip arthroplasty. EXAM: DG HIP (WITH OR WITHOUT PELVIS) 1V*L* COMPARISON:  02/20/2018 FINDINGS: Fluoroscopic images show left hip arthroplasty. Degenerative changes are noted in right hip. Fluoroscopy time 12 seconds. Radiation dose 0.74 mGy. IMPRESSION: Fluoroscopic assistance was provided for left hip arthroplasty. Electronically Signed   By: Ernie Avena M.D.   On: 11/22/2021 11:31   DG C-Arm 1-60 Min-No Report  Result Date: 11/22/2021 Fluoroscopy was utilized by the requesting physician.  No  radiographic interpretation.    DISCHARGE EXAMINATION: Vitals:   12/01/21 0452 12/01/21 1505 12/01/21 2029 12/02/21 0325  BP: (!) 140/61 136/64 136/61 (!) 143/75  Pulse: 91 75 90 92  Resp: 16 18 16 18   Temp: 98.1 F (36.7 C) 97.9 F (36.6 C) 98.1 F (36.7 C) 98 F (36.7 C)  TempSrc: Oral Oral Oral Oral  SpO2: 92% 97% 94% 94%  Weight:      Height:       General appearance: Awake alert.  In no distress Resp: Clear to auscultation bilaterally.  Normal effort Cardio: S1-S2 is normal regular.  No S3-S4.  No rubs murmurs or bruit GI: Abdomen is soft.  Nontender nondistended.  Bowel sounds are present normal.  No masses organomegaly   DISPOSITION: SNF  Discharge Instructions     Call MD for:  difficulty breathing, headache or visual disturbances   Complete by: As directed    Call MD for:  extreme fatigue   Complete by: As directed    Call MD for:  persistant dizziness or light-headedness   Complete by: As directed    Call MD for:  persistant nausea and vomiting   Complete by: As directed    Call MD for:  severe uncontrolled pain   Complete by: As directed    Call MD for:  temperature >100.4   Complete by: As directed    Diet general   Complete by: As directed    Discharge instructions   Complete by: As directed    Please review instructions on the discharge summary.  You were cared for by a hospitalist during your hospital stay. If you have any questions about your  discharge medications or the care you received while you were in the hospital after you are discharged, you can call the unit and asked to speak with the hospitalist on call if the hospitalist that took care of you is not available. Once you are discharged, your primary care physician will handle any further medical issues. Please note that NO REFILLS for any discharge medications will be authorized once you are discharged, as it is imperative that you return to your primary care physician (or establish a relationship with a primary care physician if you do not have one) for your aftercare needs so that they can reassess your need for medications and monitor your lab values. If you do not have a primary care physician, you can call [phone] for a physician referral.   Discharge wound care:   Complete by: As directed    As instructed by orthopedics.   Increase activity slowly   Complete by: As directed          Allergies as of 12/02/2021       Reactions   Levaquin [levofloxacin] Other (See Comments)   Hip pain   Rifadin [rifampin] Other (See Comments)   Flu-like symptoms   Bactrim [sulfamethoxazole-trimethoprim] Rash   Vibramycin [doxycycline] Rash   Splotches on skin        Medication List     STOP taking these medications    HYDROcodone-acetaminophen 10-325 MG tablet Commonly known as: Norco Replaced by: HYDROcodone-acetaminophen 5-325 MG tablet       TAKE these medications    acetaminophen 500 MG tablet Commonly known as: TYLENOL Take 1 tablet (500 mg total) by mouth every 8 (eight) hours. What changed:  how much to take when to take this reasons to take this   amoxicillin-clavulanate 875-125 MG tablet Commonly known as: AUGMENTIN Take  1 tablet by mouth as directed. Take 1 tablet by mouth twice daily for 7 days (one week out of each month every other month--rotating with biaxin antibiotic every other month)   aspirin EC 81 MG tablet Take 1 tablet (81 mg total) by  mouth 2 (two) times daily. Swallow whole. What changed: additional instructions   Benefiber Powd Take 1 Dose by mouth in the morning.   CALCIUM-VITAMIN D-VITAMIN K PO Take 1 tablet by mouth in the morning.   cefadroxil 500 MG capsule Commonly known as: DURICEF Take 1 capsule (500 mg total) by mouth 2 (two) times daily for 7 days.   cefadroxil 500 MG capsule Commonly known as: DURICEF Take 1 capsule (500 mg total) by mouth 2 (two) times daily for 7 days.   clarithromycin 500 MG tablet Commonly known as: BIAXIN Take 250 mg by mouth See admin instructions. Take 0.5 tablet (250 mg) by mouth twice daily for 7 days (one week out of each month every other month--rotating with augmentin antibiotic every other month)   D-Mannose Powd Take 2 g by mouth at bedtime.   feeding supplement Liqd Take 237 mLs by mouth 3 (three) times daily between meals.   fexofenadine 180 MG tablet Commonly known as: ALLEGRA Take 180 mg by mouth in the morning.   fluticasone 50 MCG/ACT nasal spray Commonly known as: FLONASE Place 1 spray into both nostrils in the morning.   gabapentin 100 MG capsule Commonly known as: NEURONTIN Take two capsules at bedtime What changed:  how much to take when to take this additional instructions   HYDROcodone-acetaminophen 5-325 MG tablet Commonly known as: NORCO/VICODIN Take 1-2 tablets by mouth every 4 (four) hours as needed for moderate pain (pain score 4-6). Replaces: HYDROcodone-acetaminophen 10-325 MG tablet   Lubricant Eye Drops 0.4-0.3 % Soln Generic drug: Polyethyl Glycol-Propyl Glycol Place 1 drop into both eyes 3 (three) times daily as needed (dry/irritated eyes.).   meloxicam 15 MG tablet Commonly known as: MOBIC Take 1 tablet (15 mg total) by mouth daily as needed for pain (and inflammation).   methocarbamol 750 MG tablet Commonly known as: Robaxin-750 Take 1 tablet (750 mg total) by mouth every 8 (eight) hours as needed for muscle spasms.    multivitamin with minerals Tabs tablet Take 1 tablet by mouth in the morning.   ondansetron 4 MG disintegrating tablet Commonly known as: ZOFRAN-ODT Take 1 tablet (4 mg total) by mouth 2 (two) times daily as needed for nausea or vomiting.   pantoprazole 40 MG tablet Commonly known as: PROTONIX Take 1 tablet (40 mg total) by mouth daily at 12 noon.   polyethylene glycol 17 g packet Commonly known as: MIRALAX / GLYCOLAX Take 17 g by mouth 2 (two) times daily.   senna-docusate 8.6-50 MG tablet Commonly known as: Senokot-S Take 2 tablets by mouth 2 (two) times daily.   sodium chloride (hypertonic) 3 % solution INHALE VIA NEBULIZER TWICE DAILY What changed: See the new instructions.   sodium chloride 0.65 % Soln nasal spray Commonly known as: OCEAN Place 1 spray into both nostrils at bedtime.   Ventolin HFA 108 (90 Base) MCG/ACT inhaler Generic drug: albuterol inhale TWO puffs into THE lungs EVERY 6 HOURS AS NEEDED FOR wheezing OR SHORTNESS OF BREATH What changed: See the new instructions.               Discharge Care Instructions  (From admission, onward)           Start  Ordered   12/02/21 0000  Discharge wound care:       Comments: As instructed by orthopedics.   12/02/21 1761              Contact information for follow-up providers     Joen Laura, MD Follow up in 2 week(s).   Specialty: Orthopedic Surgery Contact information: [address] [place] [address] [phone]              Contact information for after-discharge care     Destination     [facility]erred SN[facility] .   Service: Skilled Nursing Contact information: [address] [place] [address] [phone]                     TOTAL DISCHARGE TIME: 35 minutes  Milika Ventress Rito Ehrlich  [facility] Pager on [email or web address]  12/02/2021, 9:09 AM

## 2021-12-02 NOTE — Discharge Instructions (Signed)
20% partial weightbearing left lower extremity x6 weeks with posterior hip precautions, troch precautions: no active abduction  Dressing:  New aquacell applied 9/1. Monitor for drainage. Would consider prevena wound vac if continues to have strikethrough on new dressing

## 2021-12-06 DIAGNOSIS — R5381 Other malaise: Secondary | ICD-10-CM | POA: Diagnosis not present

## 2021-12-06 DIAGNOSIS — M978XXA Periprosthetic fracture around other internal prosthetic joint, initial encounter: Secondary | ICD-10-CM | POA: Diagnosis not present

## 2021-12-06 DIAGNOSIS — S72302A Unspecified fracture of shaft of left femur, initial encounter for closed fracture: Secondary | ICD-10-CM | POA: Diagnosis not present

## 2021-12-09 DIAGNOSIS — J479 Bronchiectasis, uncomplicated: Secondary | ICD-10-CM | POA: Diagnosis not present

## 2021-12-09 DIAGNOSIS — S72302A Unspecified fracture of shaft of left femur, initial encounter for closed fracture: Secondary | ICD-10-CM | POA: Diagnosis not present

## 2021-12-09 DIAGNOSIS — E871 Hypo-osmolality and hyponatremia: Secondary | ICD-10-CM | POA: Diagnosis not present

## 2021-12-09 DIAGNOSIS — R5381 Other malaise: Secondary | ICD-10-CM | POA: Diagnosis not present

## 2021-12-14 DIAGNOSIS — K219 Gastro-esophageal reflux disease without esophagitis: Secondary | ICD-10-CM | POA: Diagnosis not present

## 2021-12-14 DIAGNOSIS — J479 Bronchiectasis, uncomplicated: Secondary | ICD-10-CM | POA: Diagnosis not present

## 2021-12-14 DIAGNOSIS — M810 Age-related osteoporosis without current pathological fracture: Secondary | ICD-10-CM | POA: Diagnosis not present

## 2021-12-14 DIAGNOSIS — S72302A Unspecified fracture of shaft of left femur, initial encounter for closed fracture: Secondary | ICD-10-CM | POA: Diagnosis not present

## 2021-12-16 DIAGNOSIS — Z7982 Long term (current) use of aspirin: Secondary | ICD-10-CM | POA: Diagnosis not present

## 2021-12-16 DIAGNOSIS — Z792 Long term (current) use of antibiotics: Secondary | ICD-10-CM | POA: Diagnosis not present

## 2021-12-16 DIAGNOSIS — Z79891 Long term (current) use of opiate analgesic: Secondary | ICD-10-CM | POA: Diagnosis not present

## 2021-12-16 DIAGNOSIS — J479 Bronchiectasis, uncomplicated: Secondary | ICD-10-CM | POA: Diagnosis not present

## 2021-12-16 DIAGNOSIS — Z9181 History of falling: Secondary | ICD-10-CM | POA: Diagnosis not present

## 2021-12-16 DIAGNOSIS — Z96642 Presence of left artificial hip joint: Secondary | ICD-10-CM | POA: Diagnosis not present

## 2021-12-16 DIAGNOSIS — G609 Hereditary and idiopathic neuropathy, unspecified: Secondary | ICD-10-CM | POA: Diagnosis not present

## 2021-12-16 DIAGNOSIS — M9702XD Periprosthetic fracture around internal prosthetic left hip joint, subsequent encounter: Secondary | ICD-10-CM | POA: Diagnosis not present

## 2021-12-16 DIAGNOSIS — K219 Gastro-esophageal reflux disease without esophagitis: Secondary | ICD-10-CM | POA: Diagnosis not present

## 2021-12-16 DIAGNOSIS — S72302D Unspecified fracture of shaft of left femur, subsequent encounter for closed fracture with routine healing: Secondary | ICD-10-CM | POA: Diagnosis not present

## 2021-12-16 DIAGNOSIS — Z7952 Long term (current) use of systemic steroids: Secondary | ICD-10-CM | POA: Diagnosis not present

## 2021-12-16 DIAGNOSIS — E871 Hypo-osmolality and hyponatremia: Secondary | ICD-10-CM | POA: Diagnosis not present

## 2021-12-16 DIAGNOSIS — M1990 Unspecified osteoarthritis, unspecified site: Secondary | ICD-10-CM | POA: Diagnosis not present

## 2021-12-19 ENCOUNTER — Ambulatory Visit: Payer: Medicare Other | Admitting: Family Medicine

## 2022-01-02 ENCOUNTER — Telehealth: Payer: Self-pay | Admitting: Family Medicine

## 2022-01-02 NOTE — Telephone Encounter (Signed)
No answer unable to leave a message for patient to call back and schedule Medicare Annual Wellness Visit (AWV) in office.   If unable to come into the office for AWV,  please offer to do virtually or by telephone.  Last AWV: 12/10/2020  Please schedule at anytime with [facility]Nurse Health Advisor.  30 minute appointment for Virtual or phone  45 minute appointment for in office or Initial virtual/phone  Any questions, please contact me at [phone]

## 2022-01-10 DIAGNOSIS — M978XXD Periprosthetic fracture around other internal prosthetic joint, subsequent encounter: Secondary | ICD-10-CM | POA: Diagnosis not present

## 2022-01-15 DIAGNOSIS — Z792 Long term (current) use of antibiotics: Secondary | ICD-10-CM | POA: Diagnosis not present

## 2022-01-15 DIAGNOSIS — S72302D Unspecified fracture of shaft of left femur, subsequent encounter for closed fracture with routine healing: Secondary | ICD-10-CM | POA: Diagnosis not present

## 2022-01-15 DIAGNOSIS — K219 Gastro-esophageal reflux disease without esophagitis: Secondary | ICD-10-CM | POA: Diagnosis not present

## 2022-01-15 DIAGNOSIS — M9702XD Periprosthetic fracture around internal prosthetic left hip joint, subsequent encounter: Secondary | ICD-10-CM | POA: Diagnosis not present

## 2022-01-15 DIAGNOSIS — J479 Bronchiectasis, uncomplicated: Secondary | ICD-10-CM | POA: Diagnosis not present

## 2022-01-15 DIAGNOSIS — Z79891 Long term (current) use of opiate analgesic: Secondary | ICD-10-CM | POA: Diagnosis not present

## 2022-01-15 DIAGNOSIS — M1990 Unspecified osteoarthritis, unspecified site: Secondary | ICD-10-CM | POA: Diagnosis not present

## 2022-01-15 DIAGNOSIS — E871 Hypo-osmolality and hyponatremia: Secondary | ICD-10-CM | POA: Diagnosis not present

## 2022-01-15 DIAGNOSIS — G609 Hereditary and idiopathic neuropathy, unspecified: Secondary | ICD-10-CM | POA: Diagnosis not present

## 2022-01-15 DIAGNOSIS — Z7952 Long term (current) use of systemic steroids: Secondary | ICD-10-CM | POA: Diagnosis not present

## 2022-01-15 DIAGNOSIS — Z9181 History of falling: Secondary | ICD-10-CM | POA: Diagnosis not present

## 2022-01-15 DIAGNOSIS — Z96642 Presence of left artificial hip joint: Secondary | ICD-10-CM | POA: Diagnosis not present

## 2022-01-15 DIAGNOSIS — Z7982 Long term (current) use of aspirin: Secondary | ICD-10-CM | POA: Diagnosis not present

## 2022-01-17 ENCOUNTER — Encounter: Payer: Self-pay | Admitting: Family Medicine

## 2022-01-17 ENCOUNTER — Ambulatory Visit (INDEPENDENT_AMBULATORY_CARE_PROVIDER_SITE_OTHER): Payer: Medicare Other | Admitting: Family Medicine

## 2022-01-17 DIAGNOSIS — S7292XS Unspecified fracture of left femur, sequela: Secondary | ICD-10-CM

## 2022-01-17 DIAGNOSIS — M810 Age-related osteoporosis without current pathological fracture: Secondary | ICD-10-CM | POA: Insufficient documentation

## 2022-01-17 DIAGNOSIS — R636 Underweight: Secondary | ICD-10-CM

## 2022-01-17 DIAGNOSIS — M8000XD Age-related osteoporosis with current pathological fracture, unspecified site, subsequent encounter for fracture with routine healing: Secondary | ICD-10-CM | POA: Diagnosis not present

## 2022-01-17 NOTE — Patient Instructions (Signed)
Follow up in January.  Take care  Dr. Lacinda Axon

## 2022-01-18 DIAGNOSIS — R636 Underweight: Secondary | ICD-10-CM | POA: Insufficient documentation

## 2022-01-18 NOTE — Assessment & Plan Note (Signed)
Advised patient to be liberal with caloric intake.

## 2022-01-18 NOTE — Progress Notes (Signed)
Subjective:  Patient ID: Mary [name], female    DOB: [birth year]  Age: 76 y.o. MRN: [id number]  CC: Chief Complaint  Patient presents with   Follow-up    Recently d/c from [facility] on 12/23/21.     HPI:  76 year old female with bronchiectasis, GERD, severe osteoporosis status post recent hip arthroplasty and additional surgery after falling and suffering a fracture presents for follow-up.  Patient recently discharged from [facility] rehab.  Patient had total hip arthroplasty.  Subsequently had a fall and suffered a hip fracture.  She has been doing well since coming out of rehab.  She has close follow-up with orthopedics.  She is 50% weightbearing.    Overall, patient states that she seems to be doing well.  We have previously discussed her osteoporosis.  Patient's last DEXA scan was in 2021.  She has severe osteoporosis.  She was previously on bisphosphonate.  No current pharmacotherapy.  Need to discuss today.   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   Bronchiectasis ([place]) 12/06/2006    Social Hx   Social History   Socioeconomic History   Marital status: Married    Spouse name: Not on file   Number of children: 1   Years of education: Not on file   Highest education level: Not on file  Occupational History   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  Tobacco Use   Smoking status: Never   Smokeless tobacco: Never  Vaping Use   Vaping Use: Never used  Substance and Sexual Activity   Alcohol use: Yes    Comment: Occasional wine with meal    Drug use: No   Sexual activity: Not Currently  Other Topics Concern   Not on file  Social History Narrative   Right Handed   Lives in one story home    Drinks caffeine    Social Determinants of Health   Financial Resource Strain: Not on file  Food Insecurity: Not on file  Transportation Needs: Not on file  Physical Activity: Not on file  Stress: Not on file  Social Connections: Not on file    Review of Systems Per HPI  Objective:  BP 110/60   Pulse 71   Temp (!) 97.4 F (36.3 C)   Wt 102 lb 6.4 oz (46.4 kg)   SpO2 96%   BMI 17.58 kg/m      01/17/2022    2:43 PM 12/02/2021    3:25 AM 12/01/2021    8:29 PM  BP/Weight  Systolic BP 945 038 882  Diastolic BP 60 75 61  Wt. (Lbs) 102.4    BMI 17.58 kg/m2      Physical Exam Vitals and nursing note reviewed.  Constitutional:      General: She is not in acute distress.    Appearance: Normal appearance.  HENT:     Head: Normocephalic and atraumatic.  Eyes:     General:        Right eye: No discharge.        Left eye: No discharge.     Conjunctiva/sclera: Conjunctivae normal.  Cardiovascular:  Rate and Rhythm: Normal rate and regular rhythm.  Pulmonary:     Effort: Pulmonary effort is normal.     Breath sounds: Normal breath sounds.  Neurological:     Mental Status: She is alert.  Psychiatric:        Mood and Affect: Mood normal.        Behavior: Behavior normal.     Lab Results  Component Value Date   WBC 10.4 12/02/2021   HGB 10.4 (L) 12/02/2021   HCT 31.9 (L) 12/02/2021   PLT 350 12/02/2021   GLUCOSE 120 (H) 12/02/2021   CHOL 174 11/29/2020   TRIG 83 11/29/2020   HDL 66 11/29/2020   LDLCALC 93 11/29/2020   ALT 26 11/27/2021   AST 38 11/27/2021   NA 131 (L) 12/02/2021   K 4.3 12/02/2021   CL 99 12/02/2021   CREATININE 0.43 (L) 12/02/2021   BUN 7 (L) 12/02/2021   CO2 27 12/02/2021   TSH 2.850 09/05/2019   HGBA1C 5.6 02/20/2018     Assessment & Plan:   Problem List Items Addressed  This Visit       Musculoskeletal and Integument   Osteoporosis    Osteoporosis is severe.  Has had prior treatment with bisphosphonate in years prior.  Has had recent pathological fractures.  Referring to endocrinology.      Relevant Orders   Ambulatory referral to Endocrinology   Closed left femoral fracture ([place])    Seems to be doing well at this time.  Continue close orthopedic follow-up.        Other   Underweight    Advised patient to be liberal with caloric intake.       Follow-up: January 2024  [place]

## 2022-01-18 NOTE — Assessment & Plan Note (Signed)
Osteoporosis is severe.  Has had prior treatment with bisphosphonate in years prior.  Has had recent pathological fractures.  Referring to endocrinology.

## 2022-01-18 NOTE — Assessment & Plan Note (Signed)
Seems to be doing well at this time.  Continue close orthopedic follow-up.

## 2022-02-07 DIAGNOSIS — M978XXD Periprosthetic fracture around other internal prosthetic joint, subsequent encounter: Secondary | ICD-10-CM | POA: Diagnosis not present

## 2022-02-27 ENCOUNTER — Telehealth: Payer: Self-pay | Admitting: Anesthesiology

## 2022-02-27 NOTE — Telephone Encounter (Signed)
Pt is requesting a call back. Has questions about her prescription Gabapentin.

## 2022-02-28 MED ORDER — GABAPENTIN 100 MG PO CAPS: ORAL_CAPSULE | ORAL | 0 refills | Status: DC

## 2022-02-28 NOTE — Telephone Encounter (Signed)
Called and spoke to patient and she informed me that she had a fall that she thinks may have aggravated her neuropathy. She wanted to let Dr. Allena Katz know that she would like to go back on the 300 mg gabapentin at night to help. She stated she started it already but wanted to let Dr. Allena Katz know. She will need more gabapentin sent in with this increase. Patient has her f/u scheduled in January with Dr. Allena Katz.

## 2022-03-20 ENCOUNTER — Ambulatory Visit: Payer: Medicare Other | Admitting: "Endocrinology

## 2022-03-31 ENCOUNTER — Other Ambulatory Visit: Payer: Self-pay | Admitting: Neurology

## 2022-04-05 ENCOUNTER — Ambulatory Visit: Payer: Medicare Other | Admitting: Emergency Medicine

## 2022-04-05 ENCOUNTER — Telehealth: Payer: Self-pay | Admitting: Emergency Medicine

## 2022-04-05 MED ORDER — CIPROFLOXACIN HCL 500 MG PO TABS: 500.0000 mg | ORAL_TABLET | Freq: Two times a day (BID) | ORAL | 0 refills | Status: DC

## 2022-04-05 MED ORDER — PREDNISONE 10 MG PO TABS: ORAL_TABLET | ORAL | 0 refills | Status: AC

## 2022-04-05 NOTE — Telephone Encounter (Signed)
Called and spoke with pt stating to her that we could change her OV to be an acute visit instead. Pt said she did not feel well to come to the office for an appt.  Pt said on 12/28, she began to have tightness/heaviness in chest. Has been taking clarithromycin but states today 1/3, she did not feel like getting up.  Pt said she has been coughing a lot and from all the coughing, her body is sore. Pt has been taking tylenol extra strength due to symptoms. States that she has been running a low grade fever around 99. When pt coughs, she states that she will get up phlegm at times that is sometimes clear but then will turn to a brown/gray color. Also has had some wheezing.   Does have increased SOB. Has been using her nebulizer twice a day with the saline solution to see if that would help her feel better. Pt stated that since she had hip surgery August 2023, she has not been able to use her vest due to soreness as to why she increased her nebulizer solution to twice a day.  Pt has had to use rescue inhaler at least once or twice a day, depending on the day which she states has not helped much.    Due to all pt's symptoms, she wants to know what might be able to be recommended. Dr. Lamonte Sakai, please advise.

## 2022-04-05 NOTE — Telephone Encounter (Signed)
PT had an appt today w/Dr. Lamonte Sakai. She asked to have canceled due to sick, SOB, cough. She would like his nurse to call her instead @ [phone]  [facility] in [place]

## 2022-04-05 NOTE — Telephone Encounter (Signed)
Called and spoke with pt letting her know recs per Dr. Lamonte Sakai. Stated to her after she did the covid test if it came back positive to call us to let us know and she verbalized understanding. Meds have been sent to preferred pharmacy. Nothing further needed.

## 2022-04-05 NOTE — Telephone Encounter (Signed)
If she has not checked her cell for COVID that she probably should Ask her to stop the clarithromycin, see if she is willing to take Cipro 500 mg twice a day for 7 days.  (She has an allergy to Levaquin so she may not be able to take the Cipro.  If she has tolerated in the past and we can use the Cipro) Offer her a prednisone taper if she is willing to take it > Take 40mg  daily for 3 days, then 30mg  daily for 3 days, then 20mg  daily for 3 days, then 10mg  daily for 3 days, then stop

## 2022-04-15 ENCOUNTER — Other Ambulatory Visit: Payer: Self-pay | Admitting: Emergency Medicine

## 2022-04-26 ENCOUNTER — Encounter: Payer: Self-pay | Admitting: Emergency Medicine

## 2022-04-26 ENCOUNTER — Ambulatory Visit (INDEPENDENT_AMBULATORY_CARE_PROVIDER_SITE_OTHER): Payer: Medicare Other | Admitting: Emergency Medicine

## 2022-04-26 VITALS — BP 120/72 | HR 72 | Temp 97.9°F | Ht 64.0 in | Wt 103.8 lb

## 2022-04-26 DIAGNOSIS — J479 Bronchiectasis, uncomplicated: Secondary | ICD-10-CM

## 2022-04-26 DIAGNOSIS — R053 Chronic cough: Secondary | ICD-10-CM | POA: Diagnosis not present

## 2022-04-26 DIAGNOSIS — J301 Allergic rhinitis due to pollen: Secondary | ICD-10-CM | POA: Diagnosis not present

## 2022-04-26 DIAGNOSIS — K219 Gastro-esophageal reflux disease without esophagitis: Secondary | ICD-10-CM

## 2022-04-26 NOTE — Assessment & Plan Note (Signed)
Following off PPI for now, could consider adding back to depending on how she doing going forward

## 2022-04-26 NOTE — Assessment & Plan Note (Signed)
She has both upper and lower airway component to her cough.  Her bronchiectasis seems to be under fairly good control with minimal sputum production.  She is on rotating antibiotics and maintenance therapy as below.  The upper airway component is exacerbated by chronic rhinitis, GERD appears to be fairly well-controlled even off of PPI.  The Zenker's diverticulum is a problem, contributes to upper airway irritation.  Overall her maintenance is pretty good.  Plan to continue treating her rhinitis and bronchiectasis

## 2022-04-26 NOTE — Patient Instructions (Addendum)
We will plan to repeat your CT scan of the chest in September 2024 to compare with your priors. Please continue Allegra as you have been taking Please continue your Nasacort as you have been taking it Use your chest vest as needed Please start Singulair 10 mg each evening to see if this helps with drainage, congestion and cough We will continue your rotating antibiotics, clarithromycin and Augmentin, for the first week of each month Depending on how your cough does going forward we could consider adding back your reflux medication.  We will hold off for now. Follow Dr. Lamonte Sakai in September after your CT so we can review the results, call sooner if you have any problems.

## 2022-04-26 NOTE — Assessment & Plan Note (Signed)
We will plan to repeat your CT scan of the chest in September 2024 to compare with your priors. Use your chest vest as needed We will continue your rotating antibiotics, clarithromycin and Augmentin, for the first week of each month Follow Dr. Lamonte Sakai in September after your CT so we can review the results, call sooner if you have any problems.

## 2022-04-26 NOTE — Progress Notes (Signed)
Patient ID: Mary [name], female    DOB: 12-Feb-1946, 77 y.o.   MRN: [id number] HPI  ROV 09/28/21 --77 year old woman whom I have followed for chronic cough and bronchiectasis.  She has upper and lower airway component to her cough.  She is colonized with Mycobacterium Avium, Proteus, MRSA, uses 3% saline nebs, Allegra, chest vest as needed.  She is on rotating clarithromycin and Augmentin.  Also uses Allegra-D, Nasacort. She reports that she continues to have ups / downs with her cough - has been more intrusive this Spring. She is having some increased cough - the mucous color changes. On a few occasions she has started either her clarithro or augmentin a bit early. Next CT September 2023. Uses chest vest occasionally. Uses albuterol occasionally, not every day.  She is planning for a hip replacement to be done   ROV 04/26/2022.  Follow-up visit 77 year old woman with multifactorial chronic cough and bronchiectasis, colonized with Mycobacterium AVM, Proteus, MRSA.  She has upper and lower airways component to her cough, 1 issue is a Zenker's diverticulum.  She uses 3% saline nebs, Allegra, Nasacort, chest vest as needed.  She is on rotating antibiotics (clarithromycin, Augmentin). Not on protonix right now.  She reports that has been dealing with hip surgery and redo hip sgy after a fall. Her allergies are poorly controlled right now, a lot of drainage. Her cough happens daily, often at night. Her GERD is fairly well controlled off PPI.    Vitals:   04/26/22 0955  BP: 120/72  Pulse: 72  Temp: 97.9 F (36.6 C)  TempSrc: Oral  SpO2: 97%  Weight: 103 lb 12.8 oz (47.1 kg)  Height: 5\' 4"  (1.626 m)   Gen: Pleasant, Thin woman, in no distress,  normal affect  ENT: No lesions,  mouth clear,  oropharynx clear, no postnasal drip  Neck: No JVD, no stridor  Lungs: No use of accessory muscles, no wheezes or crackles  Cardiovascular: RRR, heart sounds normal, no murmur or gallops, no peripheral  edema  Musculoskeletal: No deformities, no cyanosis or clubbing  Neuro: alert, non focal  Skin: Warm, no lesions or rash    Chronic cough She has both upper and lower airway component to her cough.  Her bronchiectasis seems to be under fairly good control with minimal sputum production.  She is on rotating antibiotics and maintenance therapy as below.  The upper airway component is exacerbated by chronic rhinitis, GERD appears to be fairly well-controlled even off of PPI.  The Zenker's diverticulum is a problem, contributes to upper airway irritation.  Overall her maintenance is pretty good.  Plan to continue treating her rhinitis and bronchiectasis  GERD (gastroesophageal reflux disease) Following off PPI for now, could consider adding back to depending on how she doing going forward  Allergic rhinitis due to pollen Please continue Allegra as you have been taking Please continue your Nasacort as you have been taking it Please start Singulair 10 mg each evening to see if this helps with drainage, congestion and cough  Bronchiectasis ([place]) We will plan to repeat your CT scan of the chest in September 2024 to compare with your priors. Use your chest vest as needed We will continue your rotating antibiotics, clarithromycin and Augmentin, for the first week of each month Follow Dr. Lamonte Sakai in September after your CT so we can review the results, call sooner if you have any problems.      Baltazar Apo, MD, PhD 04/26/2022, 1:55 PM [facility] [phone] or if no answer [phone]

## 2022-04-26 NOTE — Assessment & Plan Note (Signed)
Please continue Allegra as you have been taking Please continue your Nasacort as you have been taking it Please start Singulair 10 mg each evening to see if this helps with drainage, congestion and cough

## 2022-05-01 ENCOUNTER — Telehealth: Payer: Self-pay

## 2022-05-01 ENCOUNTER — Ambulatory Visit: Payer: Medicare Other | Admitting: Neurology

## 2022-05-01 ENCOUNTER — Encounter: Payer: Self-pay | Admitting: Neurology

## 2022-05-01 VITALS — BP 131/80 | HR 85 | Ht 64.0 in | Wt 102.0 lb

## 2022-05-01 DIAGNOSIS — G608 Other hereditary and idiopathic neuropathies: Secondary | ICD-10-CM

## 2022-05-01 DIAGNOSIS — G2581 Restless legs syndrome: Secondary | ICD-10-CM

## 2022-05-01 MED ORDER — GABAPENTIN 100 MG PO CAPS: ORAL_CAPSULE | ORAL | 3 refills | Status: DC

## 2022-05-01 MED ORDER — MONTELUKAST SODIUM 10 MG PO TABS: 10.0000 mg | ORAL_TABLET | Freq: Every day | ORAL | 11 refills | Status: DC

## 2022-05-01 NOTE — Telephone Encounter (Signed)
Pt came into office today because she was unable to reach anyone in the office by phone. Pt is needing Singulair called into her pharmacy which she verified as [facility]. Pt was upset that she could not get an answer when she called office. Pt was told by RN that Singulair order would be placed and pt stated understanding. Singulair order placed. Nothing further needed a this time.

## 2022-05-01 NOTE — Progress Notes (Signed)
Follow-up Visit   Date: 05/01/22   Mary [name] MRN: [id number] DOB: 08/12/1945   Interim History: Mary [name] s a 77 y.o. right-handed Caucasian female returning to the clinic for follow-up of neuropathy.  The patient was accompanied to the clinic by self.  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   - May add dopamine agonist going forward, if needed Lumbar spondylosis with possible canal stenosis,  - Continue to follow  Return to clinic in 6 months  --------------------------------------------------------- UPDATE 05/01/2022:  She is here for follow-up visit.  She underwent left hip replacement in August and suffered a fall 4-days post-operatively which resulted in left femur fracture and redo.  She is recovering well and walks unassisted.  She uses a cane for long distances.  She felt that her neuropathy became worse after her surgery and increased gabapentin to 300mg  at bedtime, but did not feel that it helped.  She reports having restless sensation in the legs around 4-6p and often has to get up to walk to get relief.  She does not have difficulty falling or staying asleep.    Medications:  Current Outpatient Medications on File Prior to Visit  Medication Sig Dispense Refill   acetaminophen (TYLENOL) 500 MG tablet Take 1 tablet (500 mg total) by mouth every 8 (eight) hours. 30 tablet 0   amoxicillin-clavulanate (AUGMENTIN) 875-125 MG tablet TAKE 1 TABLET BY MOUTH TWICE DAILY 14 tablet 5   CALCIUM-VITAMIN D-VITAMIN K PO Take 1 tablet by mouth in the morning.     ciprofloxacin (CIPRO) 500 MG tablet Take 1 tablet (500 mg total) by mouth 2 (two) times daily. 14 tablet 0   D-Mannose POWD Take 2 g by mouth at bedtime.     feeding supplement (ENSURE ENLIVE / ENSURE PLUS) LIQD Take 237 mLs by mouth 3 (three)  times daily between meals. 237 mL 12   fexofenadine (ALLEGRA) 180 MG tablet Take 180 mg by mouth in the morning.     fluticasone (FLONASE) 50 MCG/ACT nasal spray Place 1 spray into both nostrils in the morning.     gabapentin (NEURONTIN) 100 MG capsule TAKE THREE CAPSULES BY MOUTH EVERY DAY AT BEDTIME 90 capsule 0   Multiple Vitamin (MULTIVITAMIN WITH MINERALS) TABS tablet Take 1 tablet by mouth in the morning.     Polyethyl Glycol-Propyl Glycol (LUBRICANT EYE DROPS) 0.4-0.3 % SOLN Place 1 drop into both eyes 3 (three) times daily as needed (dry/irritated eyes.).     polyethylene glycol (MIRALAX / GLYCOLAX) 17 g packet Take 17 g by mouth 2 (two) times daily. 14 each 0   senna-docusate (SENOKOT-S) 8.6-50 MG tablet Take 2 tablets by mouth 2 (two) times daily.     sodium chloride (OCEAN) 0.65 % SOLN nasal spray Place 1 spray into both nostrils at bedtime.     sodium chloride, hypertonic, 3 % solution INHALE 3ML VIA NEBULIZER TWICE DAILY (Patient taking differently: 3 mLs daily.) 240 mL 5   VENTOLIN HFA 108 (90 Base) MCG/ACT inhaler inhale TWO puffs into THE lungs EVERY 6 HOURS AS NEEDED FOR wheezing OR SHORTNESS OF BREATH (Patient taking differently: 2 puffs every 6 (six) hours as needed for wheezing or shortness of breath.) 18 g 1   Wheat Dextrin (BENEFIBER) POWD Take 1 Dose by mouth in the morning.     No current facility-administered medications on  file prior to visit.    Allergies:  Allergies  Allergen Reactions   Levaquin [Levofloxacin] Other (See Comments)    Hip pain   Rifadin [Rifampin] Other (See Comments)    Flu-like symptoms   Bactrim [Sulfamethoxazole-Trimethoprim] Rash   Vibramycin [Doxycycline] Rash    Splotches on skin    Vital Signs:  BP 131/80   Pulse 85   Ht 5\' 4"  (1.626 m)   Wt 102 lb (46.3 kg)   SpO2 96%   BMI 17.51 kg/m   Neurological Exam: MENTAL STATUS including orientation to time, place, person, recent and remote memory, attention span and concentration,  language, and fund of knowledge is normal.  Speech is not dysarthric.  CRANIAL NERVES:  Normal conjugate, extra-ocular eye movements in all directions of gaze.  No ptosis.    MOTOR:  Motor strength is 5/5 in all extremities, including distally.  No atrophy, fasciculations or abnormal movements.  No pronator drift.  Tone is normal.    MSRs:                                           Right        Left brachioradialis 2+  2+  biceps 2+  2+  triceps 2+  2+  patellar 3+  3+  ankle jerk 1+  1+   SENSORY:  Vibration reduced at the ankles. Temperature intact throughout.   COORDINATION/GAIT:    Intact rapid alternating movements bilaterally.  Gait narrow based and stable. Stressed and tandem gait.   Data: n/a     Thank you for allowing me to participate in patient's care.  If I can answer any additional questions, I would be pleased to do so.    Sincerely,    Arcenia Scarbro K. Posey Pronto, DO

## 2022-05-01 NOTE — Patient Instructions (Addendum)
It was lovely to see you!  Start taking gabapentin 100mg  at 3pm  Continue gabapentin 200mg  at bedtime  Return to clinic in 6 months

## 2022-05-08 ENCOUNTER — Encounter: Payer: Self-pay | Admitting: Family Medicine

## 2022-05-08 ENCOUNTER — Ambulatory Visit: Payer: Medicare Other | Admitting: "Endocrinology

## 2022-05-08 ENCOUNTER — Ambulatory Visit (INDEPENDENT_AMBULATORY_CARE_PROVIDER_SITE_OTHER): Payer: Medicare Other | Admitting: Family Medicine

## 2022-05-08 VITALS — BP 152/88 | HR 82 | Temp 97.5°F | Ht 64.0 in | Wt 103.0 lb

## 2022-05-08 DIAGNOSIS — R636 Underweight: Secondary | ICD-10-CM

## 2022-05-08 DIAGNOSIS — D649 Anemia, unspecified: Secondary | ICD-10-CM

## 2022-05-08 DIAGNOSIS — E871 Hypo-osmolality and hyponatremia: Secondary | ICD-10-CM

## 2022-05-08 DIAGNOSIS — R300 Dysuria: Secondary | ICD-10-CM

## 2022-05-08 DIAGNOSIS — R739 Hyperglycemia, unspecified: Secondary | ICD-10-CM | POA: Diagnosis not present

## 2022-05-08 DIAGNOSIS — Z13220 Encounter for screening for lipoid disorders: Secondary | ICD-10-CM | POA: Diagnosis not present

## 2022-05-08 HISTORY — DX: Dysuria: R30.0

## 2022-05-08 MED ORDER — NITROFURANTOIN MONOHYD MACRO 100 MG PO CAPS: 100.0000 mg | ORAL_CAPSULE | Freq: Two times a day (BID) | ORAL | 0 refills | Status: DC

## 2022-05-08 NOTE — Assessment & Plan Note (Signed)
Could not examine urine due to the fact that she took Azo. Sending for culture. Given her history, placing empirically on antibiotic therapy.  Last culture revealed sensitivity to Macrobid.  Will start [place]. Additionally, patient requested routine blood work.  Orders placed.

## 2022-05-08 NOTE — Progress Notes (Signed)
Subjective:  Patient ID: Mary [name], female    DOB: [birth year]  Age: 77 y.o. MRN: [id number]  CC: Chief Complaint  Patient presents with   burning with urination     Since yesterday AZO taken, has urology    HPI:  77 year old female presents for evaluation of the above.  Patient has a history of recurrent UTI.  Last culture revealed ESBL.  Patient reports that she developed dysuria yesterday.  She has taken Azo.  No abdominal pain.  No fever.  No other urinary symptoms.  No relieving factors.  No other complaints.  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/06/2006   Bronchiectasis ([place]) 12/06/2006   Chronic cough 12/06/2006    Social Hx   Social History   Socioeconomic History   Marital status: Married    Spouse name: Not on file   Number of children: 1   Years of education: Not on file   Highest education level: Not on file  Occupational History   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  Tobacco Use   Smoking status: Never   Smokeless tobacco: Never  Vaping Use   Vaping Use: Never used  Substance and Sexual Activity   Alcohol use: Yes    Comment: Occasional wine with meal   Drug use: No   Sexual activity: Not Currently  Other Topics Concern   Not on file  Social History Narrative   Right Handed   Lives in one story home    Drinks caffeine    Social Determinants of Health   Financial Resource Strain: Not on file  Food Insecurity: Not on file  Transportation Needs: Not on file  Physical Activity: Not on  file  Stress: Not on file  Social Connections: Not on file    Review of Systems Per HPI  Objective:  BP (!) 152/88   Pulse 82   Temp (!) 97.5 F (36.4 C)   Ht 5\' 4"  (1.626 m)   Wt 103 lb (46.7 kg)   SpO2 95%   BMI 17.68 kg/m      05/08/2022    1:42 PM 05/08/2022    1:35 PM 05/01/2022    1:21 PM  BP/Weight  Systolic BP 101 751 025  Diastolic BP 88 67 80  Wt. (Lbs)  103 102  BMI  17.68 kg/m2 17.51 kg/m2    Physical Exam Vitals and nursing note reviewed.  Constitutional:      General: She is not in acute distress.    Appearance: Normal appearance.  HENT:     Head: Normocephalic and atraumatic.  Cardiovascular:     Rate and Rhythm: Normal rate and regular rhythm.  Pulmonary:     Effort: Pulmonary effort is normal. No respiratory distress.  Abdominal:     General: There is no distension.     Palpations: Abdomen is soft.     Tenderness: There is no abdominal tenderness.  Neurological:     Mental Status: She is alert.  Psychiatric:        Mood and Affect: Mood normal.  Behavior: Behavior normal.     Lab Results  Component Value Date   WBC 10.4 12/02/2021   HGB 10.4 (L) 12/02/2021   HCT 31.9 (L) 12/02/2021   PLT 350 12/02/2021   GLUCOSE 120 (H) 12/02/2021   CHOL 174 11/29/2020   TRIG 83 11/29/2020   HDL 66 11/29/2020   LDLCALC 93 11/29/2020   ALT 26 11/27/2021   AST 38 11/27/2021   NA 131 (L) 12/02/2021   K 4.3 12/02/2021   CL 99 12/02/2021   CREATININE 0.43 (L) 12/02/2021   BUN 7 (L) 12/02/2021   CO2 27 12/02/2021   TSH 2.850 09/05/2019   HGBA1C 5.6 02/20/2018     Assessment & Plan:   Problem List Items Addressed This Visit       Other   Dysuria - Primary    Could not examine urine due to the fact that she took Azo. Sending for culture. Given her history, placing empirically on antibiotic therapy.  Last culture revealed sensitivity to Macrobid.  Will start [place]. Additionally, patient requested routine blood work.  Orders placed.       Relevant Medications   nitrofurantoin, macrocrystal-monohydrate, (MACROBID) 100 MG capsule   Other Relevant Orders   Urine Culture   Hyponatremia   Relevant Orders   CMP14+EGFR   Underweight   Relevant Orders   TSH   Other Visit Diagnoses     Anemia, unspecified type       Relevant Orders   CBC   Blood glucose elevated       Relevant Orders   Hemoglobin A1c   Screening for lipid disorders       Relevant Orders   Lipid panel      Meds ordered this encounter  Medications   nitrofurantoin, macrocrystal-monohydrate, (MACROBID) 100 MG capsule    Sig: Take 1 capsule (100 mg total) by mouth 2 (two) times daily.    Dispense:  14 capsule    Refill:  [place]

## 2022-05-08 NOTE — Patient Instructions (Signed)
Antibiotic as prescribed.  Labs whenever you like.  Take care  Dr. Lacinda Axon

## 2022-05-09 DIAGNOSIS — M978XXD Periprosthetic fracture around other internal prosthetic joint, subsequent encounter: Secondary | ICD-10-CM | POA: Diagnosis not present

## 2022-05-09 LAB — LIPID PANEL
Chol/HDL Ratio: 2.1 ratio (ref 0.0–4.4)
Cholesterol, Total: 150 mg/dL (ref 100–199)
HDL: 71 mg/dL (ref >39)
LDL Chol Calc (NIH): 65 mg/dL (ref 0–99)
Triglycerides: 70 mg/dL (ref 0–149)
VLDL Cholesterol Cal: 14 mg/dL (ref 5–40)

## 2022-05-09 LAB — CMP14+EGFR
ALT: 18 IU/L (ref 0–32)
AST: 29 IU/L (ref 0–40)
Albumin/Globulin Ratio: 2.2 (ref 1.2–2.2)
Albumin: 4.3 g/dL (ref 3.8–4.8)
Alkaline Phosphatase: 123 IU/L — ABNORMAL HIGH (ref 44–121)
BUN/Creatinine Ratio: 18 (ref 12–28)
BUN: 11 mg/dL (ref 8–27)
Bilirubin Total: 0.5 mg/dL (ref 0.0–1.2)
CO2: 23 mmol/L (ref 20–29)
Calcium: 9.4 mg/dL (ref 8.7–10.3)
Chloride: 99 mmol/L (ref 96–106)
Creatinine, Ser: 0.62 mg/dL (ref 0.57–1.00)
Globulin, Total: 2 g/dL (ref 1.5–4.5)
Glucose: 110 mg/dL — ABNORMAL HIGH (ref 70–99)
Potassium: 4.1 mmol/L (ref 3.5–5.2)
Sodium: 138 mmol/L (ref 134–144)
Total Protein: 6.3 g/dL (ref 6.0–8.5)
eGFR: 92 mL/min/{1.73_m2} (ref >59)

## 2022-05-09 LAB — CBC
Hematocrit: 40.3 % (ref 34.0–46.6)
Hemoglobin: 13.4 g/dL (ref 11.1–15.9)
MCH: 28.9 pg (ref 26.6–33.0)
MCHC: 33.3 g/dL (ref 31.5–35.7)
MCV: 87 fL (ref 79–97)
Platelets: 267 10*3/uL (ref 150–450)
RBC: 4.64 x10E6/uL (ref 3.77–5.28)
RDW: 14 % (ref 11.7–15.4)
WBC: 7.5 10*3/uL (ref 3.4–10.8)

## 2022-05-09 LAB — TSH: TSH: 1.83 u[IU]/mL (ref 0.450–4.500)

## 2022-05-09 LAB — HEMOGLOBIN A1C
Est. average glucose Bld gHb Est-mCnc: 114 mg/dL
Hgb A1c MFr Bld: 5.6 % (ref 4.8–5.6)

## 2022-05-10 LAB — URINE CULTURE

## 2022-05-15 ENCOUNTER — Ambulatory Visit: Payer: Medicare Other | Admitting: "Endocrinology

## 2022-05-15 ENCOUNTER — Encounter: Payer: Self-pay | Admitting: "Endocrinology

## 2022-05-15 VITALS — BP 124/68 | HR 76 | Ht 64.0 in | Wt 101.0 lb

## 2022-05-15 DIAGNOSIS — M8000XA Age-related osteoporosis with current pathological fracture, unspecified site, initial encounter for fracture: Secondary | ICD-10-CM | POA: Diagnosis not present

## 2022-05-15 NOTE — Progress Notes (Signed)
05/15/2022      Endocrinology Consult Note  Past Medical History:  Diagnosis Date   Allergic rhinitis    using Nasocort daily and Allegra daily   Anemia    Arthritis    "hands" (08/01/2013)   Bronchiectasis    uses Albuterol daily as needed but has been a while since used(more than a month ago),   Chronic cough    COPD (chronic obstructive pulmonary disease) ([place])    Diverticulosis    Dyspnea    with exertion   GERD (gastroesophageal reflux disease)    takes Prilosec daily   History of bladder infections    takes Keflex daily;Dr.Ottin is Urologist   History of MRSA infection    several yrs ago   Joint pain    Mycobacterium avium complex ([place])    Osteoporosis    Osteoporosis    takes Fosamax weekly   Pneumonia 12/02/1988   Zenker's diverticulum    Past Surgical History:  Procedure Laterality Date   ANTERIOR CERVICAL DECOMP/DISCECTOMY FUSION  ~ 1992   BREAST BIOPSY Left    COLONOSCOPY     COLONOSCOPY N/A 09/16/2015   Procedure: COLONOSCOPY;  Surgeon: Daneil Dolin, MD;  [facility];  Service: Endoscopy;  Laterality: N/A;  11:00 Am   ESOPHAGOGASTRODUODENOSCOPY     TOTAL HIP ARTHROPLASTY Left 11/22/2021   Procedure: TOTAL HIP ARTHROPLASTY ANTERIOR APPROACH;  Surgeon: Renette Butters, MD;  Location: [facility];  Service: Orthopedics;  Laterality: Left;   TOTAL HIP REVISION Left 11/28/2021   Procedure: TOTAL HIP REVISION;  Surgeon: Willaim Sheng, MD;  Location: [facility];  Service: Orthopedics;  Laterality: Left;   VIDEO BRONCHOSCOPY Bilateral 10/13/2016   Procedure: VIDEO BRONCHOSCOPY WITHOUT FLUORO;  Surgeon: Collene Gobble, MD;  [facility];  Service: Cardiopulmonary;  Laterality: Bilateral;   ZENKER'S DIVERTICULECTOMY ENDOSCOPIC  08/01/2013   Social History   Socioeconomic History   Marital status: Married    Spouse name: Not on file   Number of children: 1   Years of education: Not  on file   Highest education level: Not on file  Occupational History   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  Tobacco Use   Smoking status: Never   Smokeless tobacco: Never  Vaping Use   Vaping Use: Never used  Substance and Sexual Activity   Alcohol use: Not Currently    Comment: Occasional wine with meal   Drug use: No   Sexual activity: Not Currently  Other Topics Concern   Not on file  Social History Narrative   Right Handed   Lives in one story home    Drinks caffeine    Social Determinants of Health   Financial Resource Strain: Not on file  Food Insecurity: Not on file  Transportation Needs: Not on file  Physical Activity: Not on file  Stress: Not on file  Social Connections: Not on file   Outpatient Encounter Medications as of 05/15/2022  Medication Sig   sodium chloride (OCEAN) 0.65 % SOLN nasal spray Place 1 spray into both nostrils at bedtime.   sodium chloride, hypertonic, 3 % solution INHALE 3ML VIA NEBULIZER TWICE  DAILY   acetaminophen (TYLENOL) 500 MG tablet Take 1 tablet (500 mg total) by mouth every 8 (eight) hours.   CALCIUM-VITAMIN D-VITAMIN K PO Take 1 tablet by mouth in the morning.   fexofenadine (ALLEGRA) 180 MG tablet Take 180 mg by mouth in the morning.   fluticasone (FLONASE) 50 MCG/ACT nasal spray Place 1 spray into both nostrils in the morning. (Patient not taking: Reported on 05/08/2022)   gabapentin (NEURONTIN) 100 MG capsule TAKE THREE CAPSULES BY MOUTH EVERY DAY AT BEDTIME   montelukast (SINGULAIR) 10 MG tablet Take 1 tablet (10 mg total) by mouth at bedtime.   Multiple Vitamin (MULTIVITAMIN WITH MINERALS) TABS tablet Take 1 tablet by mouth in the morning.   Polyethyl Glycol-Propyl Glycol (LUBRICANT EYE DROPS) 0.4-0.3 % SOLN Place 1 drop into both eyes 3 (three) times daily as needed (dry/irritated eyes.).   VENTOLIN HFA 108 (90 Base) MCG/ACT inhaler inhale TWO puffs into THE lungs EVERY 6 HOURS AS NEEDED FOR wheezing OR SHORTNESS OF BREATH  (Patient taking differently: 2 puffs every 6 (six) hours as needed for wheezing or shortness of breath.)   Wheat Dextrin (BENEFIBER) POWD Take 1 Dose by mouth in the morning.   [DISCONTINUED] D-Mannose POWD Take 2 g by mouth at bedtime. (Patient not taking: Reported on 05/08/2022)   [DISCONTINUED] nitrofurantoin, macrocrystal-monohydrate, (MACROBID) 100 MG capsule Take 1 capsule (100 mg total) by mouth 2 (two) times daily.   No facility-administered encounter medications on file as of 05/15/2022.   ALLERGIES: Allergies  Allergen Reactions   Levaquin [Levofloxacin] Other (See Comments)    Hip pain   Rifadin [Rifampin] Other (See Comments)    Flu-like symptoms   Bactrim [Sulfamethoxazole-Trimethoprim] Rash   Vibramycin [Doxycycline] Rash    Splotches on skin    VACCINATION STATUS: Immunization History  Administered Date(s) Administered   Influenza Split 01/24/2012   Influenza Whole 01/01/2009, 01/01/2010, 01/02/2011   Influenza, High Dose Seasonal PF 01/21/2017, 03/06/2018, 02/03/2019, 07/21/2019   Influenza,inj,Quad PF,6+ Mos 01/26/2014, 02/10/2015, 02/02/2016   Influenza,inj,quad, With Preservative 12/21/2017   Influenza-Unspecified 01/06/2013, 02/08/2015, 01/21/2017, 01/31/2021, 12/14/2021   PFIZER(Purple Top)SARS-COV-2 Vaccination 05/30/2019, 06/25/2019, 07/21/2019, 03/05/2020   Pneumococcal Conjugate-13 01/26/2014   Pneumococcal Polysaccharide-23 04/04/2003, 10/25/2015, 07/21/2019     HPI   Mary [name] is 77 y.o. female who presents today with a medical history as above. she is being seen in consultation for osteoporosis requested by Coral Spikes, DO.  Patient was diagnosed with osteoporosis  approximately  10 years ago.  She was treated with Fosamax for approximately 5 years.  Her last exposure seems to be 2 to 3 years ago.   -She is not currently on treatment for osteoporosis.  Her most recent bone density is reviewed and showing significant bone loss with T-score of  -4.4 on left femur. -She is on regular calcium/vitamin D supplements. She also eats dairy and green, legumes, leafy, vegetables.  He has multiple fragility fractures.  She lost approximately 1 inch of height over the years.  She does not have renal, thyroid, nor liver dysfunction. She is in menopause.  She walks with a cane for precaution. Her other medical problems include bronchiectasis on Singulair and Ventolin HFA. She reports being light weight for most of her adult life.  Her current BMI 17.34.  Review of Systems  Constitutional: + Minimally fluctuating body weight, no fatigue, no subjective hyperthermia, no subjective hypothermia Eyes: no blurry vision, no xerophthalmia ENT: no sore throat, no nodules palpated in throat, no dysphagia/odynophagia, no  hoarseness Cardiovascular: no Chest Pain, no Shortness of Breath, no palpitations, no leg swelling Respiratory: no cough, no SOB Gastrointestinal: no Nausea/Vomiting/Diarhhea Musculoskeletal: no muscle/joint aches Skin: no rashes Neurological: no tremors, no numbness, no tingling, no dizziness Psychiatric: no depression, no anxiety  Objective:    BP 124/68   Pulse 76   Ht 5' 4"$  (1.626 m)   Wt 101 lb (45.8 kg)   BMI 17.34 kg/m   Wt Readings from Last 3 Encounters:  05/15/22 101 lb (45.8 kg)  05/08/22 103 lb (46.7 kg)  05/01/22 102 lb (46.3 kg)    Physical Exam  Constitutional: + BMI of 17.34, not in acute distress, normal state of mind Eyes: PERRLA, EOMI, no exophthalmos ENT: moist mucous membranes, no thyromegaly, no cervical lymphadenopathy Cardiovascular: normal precordial activity, Regular Rate and Rhythm, no Murmur/Rubs/Gallops Respiratory:  adequate breathing efforts, no gross chest deformity, Clear to auscultation bilaterally Gastrointestinal: abdomen soft, Non -tender, No distension, Bowel Sounds present Musculoskeletal: no gross deformities, strength intact in all four extremities Skin: moist, warm, no  rashes Neurological: no tremor with outstretched hands, Deep tendon reflexes normal in all four extremities.  CMP ( most recent) CMP     Component Value Date/Time   NA 138 05/08/2022 1413   K 4.1 05/08/2022 1413   CL 99 05/08/2022 1413   CO2 23 05/08/2022 1413   GLUCOSE 110 (H) 05/08/2022 1413   GLUCOSE 120 (H) 12/02/2021 0558   BUN 11 05/08/2022 1413   CREATININE 0.62 05/08/2022 1413   CREATININE 0.72 02/04/2014 0830   CALCIUM 9.4 05/08/2022 1413   PROT 6.3 05/08/2022 1413   ALBUMIN 4.3 05/08/2022 1413   AST 29 05/08/2022 1413   ALT 18 05/08/2022 1413   ALKPHOS 123 (H) 05/08/2022 1413   BILITOT 0.5 05/08/2022 1413   GFRNONAA >60 12/02/2021 0558   GFRAA 102 09/05/2019 0840     Diabetic Labs (most recent): Lab Results  Component Value Date   HGBA1C 5.6 05/08/2022   HGBA1C 5.6 02/20/2018     Lipid Panel ( most recent) Lipid Panel     Component Value Date/Time   CHOL 150 05/08/2022 1413   TRIG 70 05/08/2022 1413   HDL 71 05/08/2022 1413   CHOLHDL 2.1 05/08/2022 1413   CHOLHDL 2.4 02/04/2014 0830   VLDL 16 02/04/2014 0830   LDLCALC 65 05/08/2022 1413   LABVLDL 14 05/08/2022 1413      Lab Results  Component Value Date   TSH 1.830 05/08/2022   TSH 2.850 09/05/2019   TSH 1.46 09/28/2016   TSH 2.799 02/04/2014    Bone density studies from 2013-2021 DualFemur Total Left 07/16/2019 73.6 Osteoporosis -4.4 0.450 g/cm2 -3.4% - DualFemur Total Left 09/06/2015 69.8 Osteoporosis -4.3 0.466 g/cm2 -12.7% Yes DualFemur Total Left 02/21/2012 66.2 Osteoporosis -3.8 0.534 g/cm2 - -   DualFemur Total Mean 07/16/2019 73.6 Osteoporosis -4.2 0.475 g/cm2 -5.2% Yes DualFemur Total Mean 09/06/2015 69.8 Osteoporosis -4.0 0.501 g/cm2 -13.0% Yes DualFemur Total Mean 02/21/2012 66.2 Osteoporosis -3.4 0.576 g/cm2 - -   Left Forearm Radius 33% 07/16/2019 73.6 Osteoporosis -3.8 0.441 g/cm2 -4.7% - Left Forearm Radius 33% 09/06/2015 69.8 Osteoporosis -3.5 0.463 g/cm2 -  -   Assessment: 1. Osteoporosis  Plan: 1. Osteoporosis - likely postmenopausal  - Discussed about increased risk of fracture, depending on the T score, greatly increased when the T score is lower than -2.5,.   We reviewed her DEXA scans together, and I explained that based on the T scores, she has an increased risk  for fractures.  - We discussed about the different medication classes, benefits and side effects (including atypical fractures and ONJ .  Since she has taken Fosamax to unsatisfactory results, unlikely for her to benefit from oral bisphosphonates going forward.  She does not have a gross contraindication from using Prolia or Forteo.  This seem to be her best options going forward.   We discussed the benefits of treatment to lower her risk of fractures.  She hesitates to go on injectable medications, however accepts the prescription for Prolia.  I discussed the importance of staying on Prolia treatment uninterrupted to avoid the risk of withdrawal fracture. After insurance verification, she will come back for her injection in the office and every 6 months thereafter. She is due for her next bone density, study will be ordered to [facility] at [facility]. Reading material on osteoporosis was given to her.  She is encouraged to optimize her protein intake, exercise regimen. - discussed fall precautions   Her subsequent visits will include PTH/calcium, thyroid function test, and CMP. - I advised patient to maintain close follow up with Coral Spikes, DO for primary care needs.  - Time spent with the patient: 45 minutes, of which >50% was spent in obtaining information about her symptoms, reviewing her previous labs, evaluations, and treatments, counseling her about her osteoporosis, and developing a plan to confirm the diagnosis and long term treatment as necessary.  Jetty Duhamel Hamme participated in the discussions, expressed understanding, and voiced agreement with the  above plans.  All questions were answered to her satisfaction. she is encouraged to contact clinic should she have any questions or concerns prior to her return visit.  Follow up plan: Return in about 4 weeks (around 06/12/2022), or Prolia as soon as possible,, for DXA Scan B4 NV.   Glade Lloyd, MD [facility] [address] [place], [place] [address] Phone: [phone]  Fax: [phone]     05/15/2022, 6:25 PM  This note was partially dictated with voice recognition software. Similar sounding words can be transcribed inadequately or may not  be corrected upon review.

## 2022-05-29 ENCOUNTER — Ambulatory Visit (HOSPITAL_COMMUNITY)
Admission: RE | Admit: 2022-05-29 | Discharge: 2022-05-29 | Disposition: A | Discharge status: Home / Self Care | Payer: Medicare Other | Source: Ambulatory Visit | Attending: "Endocrinology | Admitting: "Endocrinology

## 2022-05-29 DIAGNOSIS — M8000XA Age-related osteoporosis with current pathological fracture, unspecified site, initial encounter for fracture: Secondary | ICD-10-CM | POA: Insufficient documentation

## 2022-05-29 DIAGNOSIS — Z780 Asymptomatic menopausal state: Secondary | ICD-10-CM | POA: Diagnosis not present

## 2022-05-29 DIAGNOSIS — M810 Age-related osteoporosis without current pathological fracture: Secondary | ICD-10-CM | POA: Insufficient documentation

## 2022-06-19 ENCOUNTER — Encounter: Payer: Self-pay | Admitting: "Endocrinology

## 2022-06-19 ENCOUNTER — Ambulatory Visit: Payer: Medicare Other | Admitting: "Endocrinology

## 2022-06-19 VITALS — BP 122/58 | HR 84 | Ht 64.0 in | Wt 101.0 lb

## 2022-06-19 DIAGNOSIS — M8000XA Age-related osteoporosis with current pathological fracture, unspecified site, initial encounter for fracture: Secondary | ICD-10-CM | POA: Diagnosis not present

## 2022-06-19 DIAGNOSIS — Z5320 Procedure and treatment not carried out because of patient's decision for unspecified reasons: Secondary | ICD-10-CM | POA: Insufficient documentation

## 2022-06-19 NOTE — Progress Notes (Signed)
06/19/2022       Endocrinology follow-up note   Past Medical History:  Diagnosis Date   Allergic rhinitis    using Nasocort daily and Allegra daily   Anemia    Arthritis    "hands" (08/01/2013)   Bronchiectasis    uses Albuterol daily as needed but has been a while since used(more than a month ago),   Chronic cough    COPD (chronic obstructive pulmonary disease) ([place])    Diverticulosis    Dyspnea    with exertion   GERD (gastroesophageal reflux disease)    takes Prilosec daily   History of bladder infections    takes Keflex daily;Dr.Ottin is Urologist   History of MRSA infection    several yrs ago   Joint pain    Mycobacterium avium complex ([place])    Osteoporosis    Osteoporosis    takes Fosamax weekly   Pneumonia 12/02/1988   Zenker's diverticulum    Past Surgical History:  Procedure Laterality Date   ANTERIOR CERVICAL DECOMP/DISCECTOMY FUSION  ~ 1992   BREAST BIOPSY Left    COLONOSCOPY     COLONOSCOPY N/A 09/16/2015   Procedure: COLONOSCOPY;  Surgeon: Daneil Dolin, MD;  [facility];  Service: Endoscopy;  Laterality: N/A;  11:00 Am   ESOPHAGOGASTRODUODENOSCOPY     TOTAL HIP ARTHROPLASTY Left 11/22/2021   Procedure: TOTAL HIP ARTHROPLASTY ANTERIOR APPROACH;  Surgeon: Renette Butters, MD;  [facility];  Service: Orthopedics;  Laterality: Left;   TOTAL HIP REVISION Left 11/28/2021   Procedure: TOTAL HIP REVISION;  Surgeon: Willaim Sheng, MD;  [facility];  Service: Orthopedics;  Laterality: Left;   VIDEO BRONCHOSCOPY Bilateral 10/13/2016   Procedure: VIDEO BRONCHOSCOPY WITHOUT FLUORO;  Surgeon: Collene Gobble, MD;  [facility];  Service: Cardiopulmonary;  Laterality: Bilateral;   ZENKER'S DIVERTICULECTOMY ENDOSCOPIC  08/01/2013   Social History   Socioeconomic History   Marital status: Married    Spouse name: Not on file   Number of children: 1   Years of education:  Not on file   Highest education level: Not on file  Occupational History   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  Tobacco Use   Smoking status: Never   Smokeless tobacco: Never  Vaping Use   Vaping Use: Never used  Substance and Sexual Activity   Alcohol use: Not Currently    Comment: Occasional wine with meal   Drug use: No   Sexual activity: Not Currently  Other Topics Concern   Not on file  Social History Narrative   Right Handed   Lives in one story home    Drinks caffeine    Social Determinants of Health   Financial Resource Strain: Not on file  Food Insecurity: Not on file  Transportation Needs: Not on file  Physical Activity: Not on file  Stress: Not on file  Social Connections: Not on file   Outpatient Encounter Medications as of 06/19/2022  Medication Sig   acetaminophen (TYLENOL) 500 MG tablet Take 1 tablet (500 mg total) by mouth every 8 (eight) hours.   CALCIUM-VITAMIN D-VITAMIN K PO Take 1 tablet by  mouth in the morning.   fexofenadine (ALLEGRA) 180 MG tablet Take 180 mg by mouth in the morning.   fluticasone (FLONASE) 50 MCG/ACT nasal spray Place 1 spray into both nostrils in the morning. (Patient not taking: Reported on 05/08/2022)   gabapentin (NEURONTIN) 100 MG capsule TAKE THREE CAPSULES BY MOUTH EVERY DAY AT BEDTIME   montelukast (SINGULAIR) 10 MG tablet Take 1 tablet (10 mg total) by mouth at bedtime.   Multiple Vitamin (MULTIVITAMIN WITH MINERALS) TABS tablet Take 1 tablet by mouth in the morning.   Polyethyl Glycol-Propyl Glycol (LUBRICANT EYE DROPS) 0.4-0.3 % SOLN Place 1 drop into both eyes 3 (three) times daily as needed (dry/irritated eyes.).   sodium chloride (OCEAN) 0.65 % SOLN nasal spray Place 1 spray into both nostrils at bedtime.   sodium chloride, hypertonic, 3 % solution INHALE 3ML VIA NEBULIZER TWICE DAILY   VENTOLIN HFA 108 (90 Base) MCG/ACT inhaler inhale TWO puffs into THE lungs EVERY 6 HOURS AS NEEDED FOR wheezing OR SHORTNESS OF  BREATH (Patient taking differently: 2 puffs every 6 (six) hours as needed for wheezing or shortness of breath.)   Wheat Dextrin (BENEFIBER) POWD Take 1 Dose by mouth in the morning.   No facility-administered encounter medications on file as of 06/19/2022.   ALLERGIES: Allergies  Allergen Reactions   Levaquin [Levofloxacin] Other (See Comments)    Hip pain   Rifadin [Rifampin] Other (See Comments)    Flu-like symptoms   Bactrim [Sulfamethoxazole-Trimethoprim] Rash   Vibramycin [Doxycycline] Rash    Splotches on skin    VACCINATION STATUS: Immunization History  Administered Date(s) Administered   Influenza Split 01/24/2012   Influenza Whole 01/01/2009, 01/01/2010, 01/02/2011   Influenza, High Dose Seasonal PF 01/21/2017, 03/06/2018, 02/03/2019, 07/21/2019   Influenza,inj,Quad PF,6+ Mos 01/26/2014, 02/10/2015, 02/02/2016   Influenza,inj,quad, With Preservative 12/21/2017   Influenza-Unspecified 01/06/2013, 02/08/2015, 01/21/2017, 01/31/2021, 12/14/2021   PFIZER(Purple Top)SARS-COV-2 Vaccination 05/30/2019, 06/25/2019, 07/21/2019, 03/05/2020   Pneumococcal Conjugate-13 01/26/2014   Pneumococcal Polysaccharide-23 04/04/2003, 10/25/2015, 07/21/2019     HPI   Mary [name] is 77 y.o. female who presents today with a medical history as above. she is being seen in follow-up after she was seen in consultation for osteoporosis requested by Coral Spikes, DO.  Patient was diagnosed with osteoporosis  approximately  10 years ago.  She was treated with Fosamax for approximately 5 years.  Her last exposure seems to be 2 to 3 years ago.   -She is not currently on treatment for osteoporosis.  Her most recent bone density is reviewed and showing significant bone loss in the right hip.  She is status post left hip surgery-not included in the study nor her spine due to degenerative joint diseases.  -She is on regular calcium/vitamin D supplements. She also eats dairy and green, legumes,  leafy, vegetables.  He has multiple fragility fractures.  She lost approximately 1 inch of height over the years.  She does not have renal, thyroid, nor liver dysfunction. She is in menopause.  She still does not want to get prescriptions for treating osteoporosis. Her other medical problems include bronchiectasis on Singulair and Ventolin HFA. She reports being light weight for most of her adult life.  Her current BMI 17.34.  Review of Systems  Constitutional: + Minimally fluctuating body weight, no fatigue, no subjective hyperthermia, no subjective hypothermia Eyes: no blurry vision, no xerophthalmia    Objective:    BP (!) 122/58   Pulse 84   Ht 5\' 4"  (1.626 m)  Wt 101 lb (45.8 kg)   BMI 17.34 kg/m   Wt Readings from Last 3 Encounters:  06/19/22 101 lb (45.8 kg)  05/15/22 101 lb (45.8 kg)  05/08/22 103 lb (46.7 kg)    Physical Exam  Constitutional: + BMI of 17.34, not in acute distress, normal state of mind Eyes: PERRLA, EOMI, no exophthalmos ENT: moist mucous membranes, no thyromegaly, no cervical lymphadenopathy   CMP ( most recent) CMP     Component Value Date/Time   NA 138 05/08/2022 1413   K 4.1 05/08/2022 1413   CL 99 05/08/2022 1413   CO2 23 05/08/2022 1413   GLUCOSE 110 (H) 05/08/2022 1413   GLUCOSE 120 (H) 12/02/2021 0558   BUN 11 05/08/2022 1413   CREATININE 0.62 05/08/2022 1413   CREATININE 0.72 02/04/2014 0830   CALCIUM 9.4 05/08/2022 1413   PROT 6.3 05/08/2022 1413   ALBUMIN 4.3 05/08/2022 1413   AST 29 05/08/2022 1413   ALT 18 05/08/2022 1413   ALKPHOS 123 (H) 05/08/2022 1413   BILITOT 0.5 05/08/2022 1413   GFRNONAA >60 12/02/2021 0558   GFRAA 102 09/05/2019 0840     Diabetic Labs (most recent): Lab Results  Component Value Date   HGBA1C 5.6 05/08/2022   HGBA1C 5.6 02/20/2018     Lipid Panel ( most recent) Lipid Panel     Component Value Date/Time   CHOL 150 05/08/2022 1413   TRIG 70 05/08/2022 1413   HDL 71 05/08/2022 1413    CHOLHDL 2.1 05/08/2022 1413   CHOLHDL 2.4 02/04/2014 0830   VLDL 16 02/04/2014 0830   LDLCALC 65 05/08/2022 1413   LABVLDL 14 05/08/2022 1413      Lab Results  Component Value Date   TSH 1.830 05/08/2022   TSH 2.850 09/05/2019   TSH 1.46 09/28/2016   TSH 2.799 02/04/2014    Right Femur Neck 05/29/2022 76.5 Osteoporosis -3.4 0.560 g/cm2 -4.9% Right Femur Neck 07/16/2019 73.6 Osteoporosis -3.2 0.589 g/cm2 -3.0% Right Femur Neck 09/06/2015 69.8 Osteoporosis -3.1 0.607 g/cm2 -4.3% Right Femur Neck 02/21/2012 66.2 Osteoporosis -2.9 0.634 g/cm2 - -   Left Forearm Radius 33% 05/29/2022 76.5 Osteoporosis -4.3 0.404 g/cm2 -8.6% - Left Forearm Radius 33% 07/16/2019 73.6 Osteoporosis -3.8 0.441 g/cm2 -4.7% - Left Forearm Radius 33% 09/06/2015 69.8 Osteoporosis -3.5 0.463 g/cm2 - -   ASSESSMENT: The BMD measured at Forearm Radius 33% is 0.404 g/cm2 with a T-score of -4.3. This patient is considered osteoporotic according to [facility]) criteria. The scan quality is good. Compared with the prior study on 07/16/19, the BMD of the rt. total hip shows no statistically significant change. Lumbar spine was excluded due to advanced degenerative changes. Lt. hip was excluded due to surgical repair.   Assessment: 1. Osteoporosis   -declined treatment  Plan: 1. Osteoporosis - likely postmenopausal /multifactorial -Her most recent DEXA scan shows worsening course, discussed about increased risk of fracture, depending on the T score.  - We discussed about the different medication classes, benefits and side effects. -Patient would like to avoid any antiosteoporotic medications at this time.  She has  taken Fosamax to unsatisfactory results.  Based options would have been Prolia or Forteo, unfortunately injectable options were not acceptable for the patient.   We discussed the benefits of treatment to lower her risk of fractures.  She hesitates to go on injectable  medications.  She would like to stay only on calcium/vitamin D supplements. Her next bone density is not due until February 2026.  She was offered low-dose Fosamax as well, however she declined this option as well.   Reading material on osteoporosis was given to her.  She is encouraged to optimize her protein intake, exercise regimen. - discussed fall precautions , we discussed about adequate protein intake. She is advised to maintain close follow-up with her for at least CMP every 6 months.  - I advised patient to maintain close follow up with Coral Spikes, DO for primary care needs.  I spent  22  minutes in the care of the patient today including review of labs from Thyroid Function, CMP, and other relevant labs ; imaging/biopsy records (current and previous including abstractions from other facilities); face-to-face time discussing  her lab results and symptoms, medications doses, her options of short and long term treatment based on the latest standards of care / guidelines;   and documenting the encounter.  Mary [name]  participated in the discussions, expressed understanding, and voiced agreement with the above plans.  All questions were answered to her satisfaction. she is encouraged to contact clinic should she have any questions or concerns prior to her return visit.   Follow up plan: Return if symptoms worsen or fail to improve.   Glade Lloyd, MD [facility] [address] [place], [place] [address] Phone: [phone]  Fax: [phone]     06/19/2022, 3:43 PM  This note was partially dictated with voice recognition software. Similar sounding words can be transcribed inadequately or may not  be corrected upon review.

## 2022-07-12 ENCOUNTER — Other Ambulatory Visit: Payer: Self-pay | Admitting: Emergency Medicine

## 2022-08-08 DIAGNOSIS — M978XXD Periprosthetic fracture around other internal prosthetic joint, subsequent encounter: Secondary | ICD-10-CM | POA: Diagnosis not present

## 2022-08-22 ENCOUNTER — Encounter: Payer: Self-pay | Admitting: Family Medicine

## 2022-08-22 ENCOUNTER — Ambulatory Visit (INDEPENDENT_AMBULATORY_CARE_PROVIDER_SITE_OTHER): Payer: Medicare Other | Admitting: Family Medicine

## 2022-08-22 VITALS — BP 168/74 | Temp 97.0°F | Ht 64.0 in | Wt 100.0 lb

## 2022-08-22 DIAGNOSIS — R300 Dysuria: Secondary | ICD-10-CM | POA: Diagnosis not present

## 2022-08-22 DIAGNOSIS — N390 Urinary tract infection, site not specified: Secondary | ICD-10-CM | POA: Insufficient documentation

## 2022-08-22 LAB — POCT URINALYSIS DIP (CLINITEK)
Bilirubin, UA: NEGATIVE
Glucose, UA: NEGATIVE mg/dL
Ketones, POC UA: NEGATIVE mg/dL
Nitrite, UA: POSITIVE — AB
POC PROTEIN,UA: 30 — AB
Spec Grav, UA: 1.015 (ref 1.010–1.025)
Urobilinogen, UA: 0.2 E.U./dL
pH, UA: 6.5 (ref 5.0–8.0)

## 2022-08-22 MED ORDER — CEPHALEXIN 500 MG PO CAPS: 500.0000 mg | ORAL_CAPSULE | Freq: Two times a day (BID) | ORAL | 0 refills | Status: DC

## 2022-08-22 NOTE — Assessment & Plan Note (Signed)
UA consistent UTI.  Sending culture.  Placing empirically on Keflex.

## 2022-08-22 NOTE — Progress Notes (Signed)
Subjective:  Patient ID: Mary [name], female    DOB: [birth year]  Age: 77 y.o. MRN: [id number]  CC: Chief Complaint  Patient presents with   Dysuria    Itching , discomfort 3 to 4 days - hx of freq UTI     HPI:  77 year old female presents for evaluation of the above.   Patient reports symptoms for the past 3 to 4 days.  Reports dysuria, vaginal irritation, and frequency as well as urinary urgency.  No flank pain.  No fever.  No relieving factors.  Has a longstanding history of recurrent GI.  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   Bronchiectasis (HCC) 12/06/2006    Social Hx   Social History   Socioeconomic History   Marital status: Married    Spouse name: Not on file   Number of children: 1   Years of education: Not on file   Highest education level: Not on file  Occupational History   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  Tobacco Use   Smoking status: Never   Smokeless tobacco: Never  Vaping Use   Vaping Use: Never used  Substance and Sexual Activity   Alcohol use: Not Currently    Comment: Occasional wine with meal   Drug use: No   Sexual activity: Not Currently  Other Topics Concern   Not on file  Social History Narrative   Right Handed   Lives in one story home    Drinks caffeine    Social Determinants of Health   Financial Resource Strain: Not on file  Food Insecurity: Not on file  Transportation Needs: Not on file  Physical Activity: Not on file  Stress: Not on file  Social Connections: Not on file     Review of Systems Per HPI  Objective:  BP (!) 168/74   Temp (!) 97 F (36.1 C)   Ht 5\' 4"  (1.626 m)   Wt 100 lb (45.4 kg)   SpO2 95%   BMI 17.16 kg/m      08/22/2022   11:02 AM 06/19/2022   12:56 PM 05/15/2022    2:39 PM  BP/Weight  Systolic BP 168 122 124  Diastolic BP 74 58 68  Wt. (Lbs) 100 101 101  BMI 17.16 kg/m2 17.34 kg/m2 17.34 kg/m2    Physical Exam Vitals and nursing note reviewed.  Constitutional:      General: She is not in acute distress.    Appearance: Normal appearance.  HENT:     Head: Normocephalic and atraumatic.  Cardiovascular:     Rate and Rhythm: Normal rate and regular rhythm.  Pulmonary:     Effort: Pulmonary effort is normal. No respiratory distress.  Abdominal:     General: There is no distension.     Palpations: Abdomen is soft.     Tenderness: There is no right CVA tenderness or left CVA tenderness.  Neurological:     Mental Status: She is alert.     Lab Results  Component Value Date   WBC 7.5 05/08/2022   HGB 13.4 05/08/2022   HCT  40.3 05/08/2022   PLT 267 05/08/2022   GLUCOSE 110 (H) 05/08/2022   CHOL 150 05/08/2022   TRIG 70 05/08/2022   HDL 71 05/08/2022   LDLCALC 65 05/08/2022   ALT 18 05/08/2022   AST 29 05/08/2022   NA 138 05/08/2022   K 4.1 05/08/2022   CL 99 05/08/2022   CREATININE 0.62 05/08/2022   BUN 11 05/08/2022   CO2 23 05/08/2022   TSH 1.830 05/08/2022   HGBA1C 5.6 05/08/2022     Assessment & Plan:   Problem List Items Addressed This Visit       Genitourinary   Recurrent UTI - Primary    UA consistent UTI.  Sending culture.  Placing empirically on Keflex.      Relevant Medications   cephALEXin (KEFLEX) 500 MG capsule   Other Relevant Orders   POCT URINALYSIS DIP (CLINITEK) (Completed)   Urine Culture     Other   RESOLVED: Dysuria    Meds ordered this encounter  Medications   cephALEXin (KEFLEX) 500 MG capsule    Sig: Take 1 capsule (500 mg total) by mouth 2 (two) times daily.     Dispense:  14 capsule    Refill:  0    Follow-up:  Return if symptoms worsen or fail to improve.  Everlene Other DO [facility]

## 2022-08-23 ENCOUNTER — Other Ambulatory Visit: Payer: Self-pay | Admitting: Emergency Medicine

## 2022-08-25 ENCOUNTER — Ambulatory Visit: Payer: Medicare Other | Admitting: Nurse Practitioner

## 2022-08-25 LAB — URINE CULTURE

## 2022-08-25 LAB — SPECIMEN STATUS REPORT

## 2022-08-26 ENCOUNTER — Other Ambulatory Visit: Payer: Self-pay | Admitting: Family Medicine

## 2022-08-26 MED ORDER — AMOXICILLIN-POT CLAVULANATE 875-125 MG PO TABS: 1.0000 | ORAL_TABLET | Freq: Two times a day (BID) | ORAL | 0 refills | Status: DC

## 2022-08-30 DIAGNOSIS — Z1231 Encounter for screening mammogram for malignant neoplasm of breast: Secondary | ICD-10-CM | POA: Diagnosis not present

## 2022-08-30 DIAGNOSIS — Z01419 Encounter for gynecological examination (general) (routine) without abnormal findings: Secondary | ICD-10-CM | POA: Diagnosis not present

## 2022-08-30 LAB — HM MAMMOGRAPHY

## 2022-09-25 DIAGNOSIS — K080 Exfoliation of teeth due to systemic causes: Secondary | ICD-10-CM | POA: Diagnosis not present

## 2022-10-30 ENCOUNTER — Encounter: Payer: Self-pay | Admitting: Neurology

## 2022-10-30 ENCOUNTER — Ambulatory Visit: Payer: Medicare Other | Admitting: Neurology

## 2022-10-30 VITALS — BP 129/63 | HR 76 | Ht 64.0 in | Wt 99.0 lb

## 2022-10-30 DIAGNOSIS — M4306 Spondylolysis, lumbar region: Secondary | ICD-10-CM

## 2022-10-30 DIAGNOSIS — G2581 Restless legs syndrome: Secondary | ICD-10-CM

## 2022-10-30 DIAGNOSIS — G608 Other hereditary and idiopathic neuropathies: Secondary | ICD-10-CM | POA: Diagnosis not present

## 2022-10-30 NOTE — Patient Instructions (Addendum)
Increase gabapentin to 200mg  at noon x 1 week, then increase gabapentin to 300mg  at bedtime  I will see you back in 6 months

## 2022-10-30 NOTE — Progress Notes (Signed)
Follow-up Visit   Date: 10/30/22   Mary [name] MRN: [id number] DOB: 07-11-1945   Interim History: Mary [name] s a 77 y.o. right-handed Caucasian female returning to the clinic for follow-up of neuropathy and restless leg syndrome.  The patient was accompanied to the clinic by self.  IMPRESSION/PLAN: Idiopathic peripheral neuropathy below the ankles, worsening Restless leg syndrome, worsening Lumbar spondylosis with possible canal stenosis  PLAN: Increase gabapentin to 200mg  at noon x 1 week, then increase gabapentin to 300mg  at bedtime. Side effects discussed. MRI lumbar spine offered, she will discuss with orthopeadics and then decide  Return to clinic in 6 months  --------------------------------------------------------- UPDATE 05/01/2022:  She is here for follow-up visit.  She underwent left hip replacement in August and suffered a fall 4-days post-operatively which resulted in left femur fracture and redo.  She is recovering well and walks unassisted.  She uses a cane for long distances.  She felt that her neuropathy became worse after her surgery and increased gabapentin to 300mg  at bedtime, but did not feel that it helped.  She reports having restless sensation in the legs around 4-6p and often has to get up to walk to get relief.  She does not have difficulty falling or staying asleep.   UPDATE 10/30/2022:  She is here for 6 month follow-up visit.  Since starting gabapentin 100mg  at 3pm, she noticed that it did help; but since she started having burning sensation in the legs earlier in the afternoon, she now takes gabapentin 100mg  and continues gabapentin 200mg  at 8pm.  She endorses low back pain.  Medications:  Current Outpatient Medications on File Prior to Visit  Medication Sig Dispense Refill   acetaminophen (TYLENOL) 500 MG tablet Take 1 tablet (500 mg total) by mouth every 8 (eight) hours. 30 tablet 0   CALCIUM-VITAMIN D-VITAMIN K PO Take 1 tablet by  mouth in the morning.     clarithromycin (BIAXIN) 500 MG tablet TAKE 1/2 TABLET BY MOUTH TWICE DAILY 7 tablet 5   fexofenadine (ALLEGRA) 180 MG tablet Take 180 mg by mouth in the morning.     fluticasone (FLONASE) 50 MCG/ACT nasal spray Place 1 spray into both nostrils in the morning.     gabapentin (NEURONTIN) 100 MG capsule TAKE THREE CAPSULES BY MOUTH EVERY DAY AT BEDTIME (Patient taking differently: 2 (two) times daily. TAKE ONE CAPSULES AT LUNCH AND TWO CAPSULES AT NIGHT) 270 capsule 3   montelukast (SINGULAIR) 10 MG tablet Take 1 tablet (10 mg total) by mouth at bedtime. 30 tablet 11   Multiple Vitamin (MULTIVITAMIN WITH MINERALS) TABS tablet Take 1 tablet by mouth in the morning.     Polyethyl Glycol-Propyl Glycol (LUBRICANT EYE DROPS) 0.4-0.3 % SOLN Place 1 drop into both eyes 3 (three) times daily as needed (dry/irritated eyes.).     sodium chloride (OCEAN) 0.65 % SOLN nasal spray Place 1 spray into both nostrils at bedtime.     sodium chloride, hypertonic, 3 % solution INHALE VIA NEBULIZER TWICE DAILY 240 mL 5   VENTOLIN HFA 108 (90 Base) MCG/ACT inhaler inhale TWO puffs into THE lungs EVERY 6 HOURS AS NEEDED FOR wheezing OR SHORTNESS OF BREATH (Patient taking differently: 2 puffs every 6 (six) hours as needed for wheezing or shortness of breath.) 18 g 1   Wheat Dextrin (BENEFIBER) POWD Take 1 Dose by mouth in the morning.     No current facility-administered medications on file prior to visit.    Allergies:  Allergies  Allergen Reactions   Levaquin [Levofloxacin] Other (See Comments)    Hip pain   Rifadin [Rifampin] Other (See Comments)    Flu-like symptoms   Bactrim [Sulfamethoxazole-Trimethoprim] Rash   Vibramycin [Doxycycline] Rash    Splotches on skin    Vital Signs:  BP 129/63   Pulse 76   Ht 5\' 4"  (1.626 m)   Wt 99 lb (44.9 kg)   SpO2 98%   BMI 16.99 kg/m   Neurological Exam: MENTAL STATUS including orientation to time, place, person, recent and remote  memory, attention span and concentration, language, and fund of knowledge is normal.  Speech is not dysarthric.  CRANIAL NERVES:  Normal conjugate, extra-ocular eye movements in all directions of gaze.  No ptosis.    MOTOR:  Motor strength is 5/5 in all extremities, including distally.  No atrophy, fasciculations or abnormal movements.  No pronator drift.  Tone is normal.    MSRs:                                           Right        Left brachioradialis 2+  2+  biceps 2+  2+  triceps 2+  2+  patellar 3+  3+  ankle jerk 1+  1+   SENSORY:  Vibration reduced at the ankles. Temperature intact throughout.   COORDINATION/GAIT:    Intact rapid alternating movements bilaterally.  Gait narrow based and stable. Stressed and tandem gait.   Data: n/a     Thank you for allowing me to participate in patient's care.  If I can answer any additional questions, I would be pleased to do so.    Sincerely,    Tekeya Geffert K. Allena Katz, DO

## 2022-11-07 DIAGNOSIS — M545 Low back pain, unspecified: Secondary | ICD-10-CM | POA: Diagnosis not present

## 2022-11-07 DIAGNOSIS — M978XXD Periprosthetic fracture around other internal prosthetic joint, subsequent encounter: Secondary | ICD-10-CM | POA: Diagnosis not present

## 2022-11-10 DIAGNOSIS — H524 Presbyopia: Secondary | ICD-10-CM | POA: Diagnosis not present

## 2022-11-10 DIAGNOSIS — H353132 Nonexudative age-related macular degeneration, bilateral, intermediate dry stage: Secondary | ICD-10-CM | POA: Diagnosis not present

## 2022-11-16 DIAGNOSIS — M810 Age-related osteoporosis without current pathological fracture: Secondary | ICD-10-CM | POA: Diagnosis not present

## 2022-11-16 DIAGNOSIS — E559 Vitamin D deficiency, unspecified: Secondary | ICD-10-CM | POA: Diagnosis not present

## 2022-11-16 DIAGNOSIS — R5383 Other fatigue: Secondary | ICD-10-CM | POA: Diagnosis not present

## 2022-11-27 DIAGNOSIS — M810 Age-related osteoporosis without current pathological fracture: Secondary | ICD-10-CM | POA: Diagnosis not present

## 2022-12-05 ENCOUNTER — Ambulatory Visit
Admission: RE | Admit: 2022-12-05 | Discharge: 2022-12-05 | Disposition: A | Discharge status: Home / Self Care | Payer: Medicare Other | Source: Ambulatory Visit | Attending: Emergency Medicine | Admitting: Emergency Medicine

## 2022-12-05 DIAGNOSIS — J479 Bronchiectasis, uncomplicated: Secondary | ICD-10-CM | POA: Diagnosis not present

## 2022-12-05 DIAGNOSIS — I700 Atherosclerosis of aorta: Secondary | ICD-10-CM | POA: Diagnosis not present

## 2022-12-05 DIAGNOSIS — I2510 Atherosclerotic heart disease of native coronary artery without angina pectoris: Secondary | ICD-10-CM | POA: Diagnosis not present

## 2022-12-05 DIAGNOSIS — R911 Solitary pulmonary nodule: Secondary | ICD-10-CM | POA: Diagnosis not present

## 2022-12-06 ENCOUNTER — Other Ambulatory Visit: Payer: Self-pay | Admitting: Family Medicine

## 2022-12-14 ENCOUNTER — Telehealth: Payer: Self-pay | Admitting: Emergency Medicine

## 2022-12-14 NOTE — Telephone Encounter (Signed)
[facility] calling with a call report.  Pls call @ [phone]

## 2022-12-15 ENCOUNTER — Telehealth: Payer: Self-pay | Admitting: Neurology

## 2022-12-15 MED ORDER — GABAPENTIN 100 MG PO CAPS: ORAL_CAPSULE | ORAL | 3 refills | Status: DC

## 2022-12-15 NOTE — Telephone Encounter (Signed)
Pt called in stating the pharmacy stated she couldn't refill it because it was too soon. She thought the dosage had been updated and wants to double check.

## 2022-12-15 NOTE — Telephone Encounter (Signed)
Received call report from Diane with [place] [facility] on patient's CT chest done on 12/05/22. Dr. Delton Coombes please review the result/impression copied below:  IMPRESSION: 1. Widespread patchy regions of moderate cylindrical bronchiectasis with associated bronchial wall thickening, extensive patchy mucoid impaction, mild scattered tree-in-bud opacity and occasional scattered centrilobular nodularity. 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 2. Findings are largely unchanged from 12/10/2020 CT, with the exception of interval growth of a peripheral right upper lobe 0.8 cm solid pulmonary nodule, increased from 0.4 cm, indeterminate for enlarging inflammatory nodule (favored) versus slow growing pulmonary neoplasm. Suggest continued chest CT surveillance in 3-6 months. PET-CT or tissue sampling could be considered at this time as clinically warranted. 3. One vessel coronary atherosclerosis. 4.  Aortic Atherosclerosis (ICD10-I70.0).  Please advise, thank you.

## 2022-12-15 NOTE — Telephone Encounter (Signed)
Patient aware that Rx is updated and sent to pharmacy.

## 2022-12-15 NOTE — Telephone Encounter (Signed)
Patient stated she is currently taking Gabapentin 100mg -  two tablets at noon and 3 tablets at bedtime. Patient states that it has been working well for her.

## 2022-12-15 NOTE — Telephone Encounter (Signed)
Updated Rx to reflect how she is taking it.

## 2022-12-15 NOTE — Telephone Encounter (Signed)
New Rx sent with updated instructions to take 1 tablet at noon and 3 tablets at bedtime.

## 2022-12-15 NOTE — Telephone Encounter (Signed)
Tiffany calling from [facility] again with call report. Pls resolve. Thanks.

## 2022-12-21 NOTE — Telephone Encounter (Signed)
Ms. Attig has follow-up arranged on 01/05/2023.  Plan to review her CT chest, clinical status at that visit.

## 2022-12-25 DIAGNOSIS — M810 Age-related osteoporosis without current pathological fracture: Secondary | ICD-10-CM | POA: Diagnosis not present

## 2022-12-26 ENCOUNTER — Other Ambulatory Visit: Payer: Self-pay

## 2022-12-27 ENCOUNTER — Telehealth: Payer: Self-pay

## 2022-12-27 NOTE — Telephone Encounter (Signed)
Auth Submission: NO AUTH NEEDED Site of care: Site of care: [facility] Payer: BLUE CROSS BLUE SHIELD MEDICARE  Medication & CPT/J Code(s) submitted: Reclast (Zolendronic acid) [id number] Route of submission (phone, fax, portal): phone Phone # Fax # Auth type: Buy/Bill PB Units/visits requested: 5mg , 1 dose Reference number:  Approval from: 12/27/22 to 04/03/23

## 2023-01-05 ENCOUNTER — Encounter: Payer: Self-pay | Admitting: Emergency Medicine

## 2023-01-05 ENCOUNTER — Ambulatory Visit: Payer: Medicare Other | Admitting: Emergency Medicine

## 2023-01-05 VITALS — BP 120/60 | HR 76 | Ht 64.0 in | Wt 102.0 lb

## 2023-01-05 DIAGNOSIS — A310 Pulmonary mycobacterial infection: Secondary | ICD-10-CM | POA: Diagnosis not present

## 2023-01-05 DIAGNOSIS — J479 Bronchiectasis, uncomplicated: Secondary | ICD-10-CM | POA: Diagnosis not present

## 2023-01-05 MED ORDER — BUDESONIDE-FORMOTEROL FUMARATE 80-4.5 MCG/ACT IN AERO: 2.0000 | INHALATION_SPRAY | Freq: Two times a day (BID) | RESPIRATORY_TRACT | 12 refills | Status: DC

## 2023-01-05 NOTE — Assessment & Plan Note (Signed)
Bronchiectasis Stable with no significant changes on recent CT scan. Colonized with Mycobacterium avium, Proteus, and MRSA. Chronic cough with intermittent episodes of increased coughing and clear sputum production. Shortness of breath with strenuous activity. Currently on 3% saline nebs, Allegra, Nasacort, chest vest as needed, and rotating clarithromycin and Augmentin for the first week of each month. -Continue current regimen. -Consider adding scheduled inhaled medication (Symbicort or Dulera) to potentially reduce symptoms and albuterol use.  General Health Maintenance -Flu shot to be administered by PCP in 1 month. -Pneumonia vaccination up to date. -Consider RSV vaccination to prevent potential exacerbation of bronchiectasis.

## 2023-01-05 NOTE — Progress Notes (Signed)
Patient ID: Mary [name], female    DOB: [birth year], 77 y.o.   MRN: [id number] HPI  The patient, a 77 year old with a history of bronchiectasis, chronic cough, and colonization with Mycobacterium avium, Proteus, and MRSA, presents for a follow-up visit. She has a Zinkers diverticulum, which may contribute to upper airway irritation and cough. The patient is managed with 3% saline nebulizers, Allegra, Nasacort, and a chest vest as needed. She is also on a rotating regimen of clarithromycin and Augmentin for the first week of each month. She has previously been on a PPI for GERD but is not currently on this medication.  The patient reports episodes of coughing, which can last for up to six months at a time. The cough is currently active, but she is not producing any colored sputum. The sputum that is produced is clear, occasionally with a creamy appearance. The patient reports having to fight to get the sputum out and uses a nebulizer, which makes her feel better. She has also noticed an increase in shortness of breath, particularly when doing strenuous activities around the house, such as vacuuming or a lot of walking outside. She has been using her albuterol inhaler more frequently due to this.  The patient has a history of allergies and is currently taking over-the-counter medications, including Allegra and Nasacort, as well as Singulair. Despite this, she still experiences stuffiness. She has also been on rotating antibiotics at the beginning of each month. She has previously used a chest vest, but has not been using it regularly due to complications from hip surgery. She has received the pneumonia vaccine and is considering getting the RSV vaccine.  Repeat CT scan of the chest done 12/05/2022 reviewed by me showed no pathological mediastinal or hilar adenopathy, widespread patchy cylindrical bronchiectasis bilaterally with some associated wall thickening and mucoid impaction with scattered tree-in-bud  nodularity.  The right middle lobe and right lower lobe are the most involved.  A peripheral right upper lobe 8 mm solid nodule was increased from 4 mm on her prior from 12/10/2020   Vitals:   01/05/23 1424  BP: 120/60  Pulse: 76  SpO2: 96%  Weight: 102 lb (46.3 kg)  Height: 5\' 4"  (1.626 m)    Gen: Pleasant, Thin woman, in no distress,  normal affect  ENT: No lesions,  mouth clear,  oropharynx clear, no postnasal drip  Neck: No JVD, no stridor  Lungs: No use of accessory muscles, no wheezes or crackles  Cardiovascular: RRR, heart sounds normal, no murmur or gallops, no peripheral edema  Musculoskeletal: No deformities, no cyanosis or clubbing  Neuro: alert, non focal  Skin: Warm, no lesions or rash    Bronchiectasis (HCC) Bronchiectasis Stable with no significant changes on recent CT scan. Colonized with Mycobacterium avium, Proteus, and MRSA. Chronic cough with intermittent episodes of increased coughing and clear sputum production. Shortness of breath with strenuous activity. Currently on 3% saline nebs, Allegra, Nasacort, chest vest as needed, and rotating clarithromycin and Augmentin for the first week of each month. -Continue current regimen. -Consider adding scheduled inhaled medication (Symbicort or Dulera) to potentially reduce symptoms and albuterol use.  General Health Maintenance -Flu shot to be administered by PCP in 1 month. -Pneumonia vaccination up to date. -Consider RSV vaccination to prevent potential exacerbation of bronchiectasis.  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  She had a nodule in the peripheral right upper lobe that went from 4 mm to 8 mm compared with 12/2020.  Suspect that this is inflammatory but we will repeat a CT chest sooner, 6 months to ensure no interval change that would increase our suspicion for possible malignancy or require diagnostic testing.      Levy Pupa, MD,  PhD 01/05/2023, 2:51 PM [facility] [phone] or if no answer [phone]

## 2023-01-05 NOTE — Assessment & Plan Note (Signed)
Chronic pulmonary nodule disease.  She had a nodule in the peripheral right upper lobe that went from 4 mm to 8 mm compared with 12/2020.  Suspect that this is inflammatory but we will repeat a CT chest sooner, 6 months to ensure no interval change that would increase our suspicion for possible malignancy or require diagnostic testing.

## 2023-01-05 NOTE — Patient Instructions (Signed)
VISIT SUMMARY:  During your recent visit, we discussed your ongoing issues with bronchiectasis, a condition where the airways in your lungs are damaged, causing them to widen and become flabby. We also discussed your chronic cough and colonization with certain bacteria. We reviewed your recent CT scan, which showed no significant changes in your bronchiectasis but did show an increase in the size of a nodule in your lung. We also discussed your general health maintenance, including vaccinations.  YOUR PLAN:  -BRONCHIECTASIS: Your bronchiectasis is stable with no significant changes. You should continue your current regimen of saline nebulizers, Allegra, Nasacort, and a chest vest as needed, along with rotating clarithromycin and Augmentin for the first week of each month.  We will start Symbicort 80/4.5 mcg 2 puffs twice a day to potentially reduce your symptoms and the need for your albuterol inhaler.  Please rinse and gargle after using  -PULMONARY NODULE: The nodule in your lung has increased in size from 4mm to 8mm. However, we have a low suspicion for cancer given your history and presentation. We will repeat a CT scan in 6 months to monitor for any changes.  -GENERAL HEALTH MAINTENANCE: In terms of general health maintenance, you should get a flu shot from your primary care physician in 1 month. Your pneumonia vaccination is up to date. We are also considering an RSV vaccination to prevent potential worsening of your bronchiectasis.  INSTRUCTIONS:  Please continue with your current treatment regimen and consider the addition of a scheduled inhaled medication. We will schedule a repeat CT scan in 6 months to monitor the nodule in your lung. Please get a flu shot from your primary care physician in 1 month and consider getting an RSV vaccination.

## 2023-01-08 ENCOUNTER — Encounter: Payer: Medicare Other | Attending: Sports Medicine | Admitting: Emergency Medicine

## 2023-01-08 VITALS — BP 148/62 | HR 56 | Temp 97.4°F | Resp 18

## 2023-01-08 DIAGNOSIS — M8000XD Age-related osteoporosis with current pathological fracture, unspecified site, subsequent encounter for fracture with routine healing: Secondary | ICD-10-CM

## 2023-01-08 MED ORDER — DIPHENHYDRAMINE HCL 25 MG PO CAPS
25.0000 mg | ORAL_CAPSULE | Freq: Once | ORAL | Status: AC
  Administered 2023-01-08: 25 mg via ORAL
  Filled 2023-01-08: qty 1

## 2023-01-08 MED ORDER — ACETAMINOPHEN 325 MG PO TABS
650.0000 mg | ORAL_TABLET | Freq: Once | ORAL | Status: AC
  Administered 2023-01-08: 650 mg via ORAL
  Filled 2023-01-08: qty 2

## 2023-01-08 MED ORDER — ZOLEDRONIC ACID 5 MG/100ML IV SOLN
5.0000 mg | Freq: Once | INTRAVENOUS | Status: AC
  Administered 2023-01-08: 5 mg via INTRAVENOUS
  Filled 2023-01-08: qty 100

## 2023-01-08 NOTE — Progress Notes (Signed)
Diagnosis: Osteoporosis  Provider:  Albertha Ghee MD  Procedure: IV Infusion  IV Type: Peripheral, IV Location: L Forearm  Reclast (Zolendronic Acid), Dose: 5 mg  Infusion Start Time: 1333  Infusion Stop Time: 1407  Post Infusion IV Care: Observation period completed and Peripheral IV Discontinued  Discharge: Condition: Good, Destination: Home . AVS Provided  Performed by:  Arrie Senate, RN

## 2023-01-17 ENCOUNTER — Ambulatory Visit (INDEPENDENT_AMBULATORY_CARE_PROVIDER_SITE_OTHER): Payer: Medicare Other | Admitting: Family Medicine

## 2023-01-17 VITALS — BP 128/72 | HR 79 | Ht 64.0 in | Wt 103.2 lb

## 2023-01-17 DIAGNOSIS — Z23 Encounter for immunization: Secondary | ICD-10-CM | POA: Diagnosis not present

## 2023-01-17 DIAGNOSIS — A310 Pulmonary mycobacterial infection: Secondary | ICD-10-CM

## 2023-01-17 DIAGNOSIS — M810 Age-related osteoporosis without current pathological fracture: Secondary | ICD-10-CM

## 2023-01-17 DIAGNOSIS — N390 Urinary tract infection, site not specified: Secondary | ICD-10-CM | POA: Diagnosis not present

## 2023-01-17 DIAGNOSIS — M8000XD Age-related osteoporosis with current pathological fracture, unspecified site, subsequent encounter for fracture with routine healing: Secondary | ICD-10-CM

## 2023-01-17 NOTE — Patient Instructions (Signed)
Vaccines as we discussed.   Take care  Dr. Adriana Simas

## 2023-01-18 NOTE — Assessment & Plan Note (Signed)
Has been placed on nightly Keflex by her OB/GYN.

## 2023-01-18 NOTE — Assessment & Plan Note (Signed)
Patient now on IV Reclast.  This is done through orthopedics.  She will continue to do this.

## 2023-01-18 NOTE — Assessment & Plan Note (Signed)
Stable at this time.  Managed by pulmonary.

## 2023-01-18 NOTE — Progress Notes (Signed)
Subjective:  Patient ID: Mary [name], female    DOB: 1945/12/11  Age: 77 y.o. MRN: [id number]  CC:  Follow up; discuss immunizations   HPI:  77 year old female with bronchiectasis, pulmonary MAC infection, GERD, osteoporosis, recurrent UTI presents for follow-up.  Patient was seen by endocrinology regarding osteoporosis.  It was recommended that she start Prolia but patient did not want to do so.  She has since seen orthopedics and she has started IV Reclast.  Patient follows closely with pulmonology.  Patient wants to discuss recommended vaccines today.  She is amenable to getting her flu shot.  I recommended COVID-19 vaccination for her especially in the setting of her chronic pulmonary issues.  I advised her that she can consider RSV as well.  She does not need pneumococcal vaccination at this time.  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   Bronchiectasis (HCC) 12/06/2006    Social Hx   Social History   Socioeconomic History   Marital status: Married    Spouse name: Not on file   Number of children: 1   Years of education: Not on file   Highest education level: Not on file  Occupational History   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  Tobacco Use   Smoking status: Never   Smokeless tobacco: Never  Vaping Use   Vaping status: Never Used  Substance and Sexual Activity   Alcohol use: Not Currently    Comment: Occasional wine with meal   Drug use: No   Sexual activity: Not Currently  Other Topics Concern   Not on file   Social History Narrative   Right Handed   Lives in one story home    Drinks caffeine    Social Determinants of Health   Financial Resource Strain: Not on file  Food Insecurity: Not on file  Transportation Needs: Not on file  Physical Activity: Not on file  Stress: Not on file  Social Connections: Not on file    Review of Systems  Constitutional: Negative.   Respiratory: Negative.    Cardiovascular: Negative.    Per HPI  Objective:  BP 128/72   Pulse 79   Ht 5\' 4"  (1.626 m)   Wt 103 lb 3.2 oz (46.8 kg)   SpO2 96%   BMI 17.71 kg/m      01/17/2023    2:33 PM 01/08/2023    2:33 PM 01/08/2023   12:54 PM  BP/Weight  Systolic BP 128 148 148  Diastolic BP 72 62 61  Wt. (Lbs) 103.2    BMI 17.71 kg/m2      Physical Exam Vitals and nursing note reviewed.  Constitutional:      General: She is not in acute distress.    Appearance: Normal appearance.  HENT:     Head: Normocephalic and atraumatic.  Cardiovascular:     Rate and Rhythm: Normal rate and regular rhythm.  Pulmonary:     Effort: Pulmonary effort is normal.     Breath sounds: Rales present.  Neurological:     Mental Status: She is alert.  Psychiatric:  Mood and Affect: Mood normal.        Behavior: Behavior normal.     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  Managed by pulmonary.      Relevant  Medications   cephALEXin (KEFLEX) 250 MG capsule     Musculoskeletal and Integument   Osteoporosis - Primary    Patient now on IV Reclast.  This is done through orthopedics.  She will continue to do this.        Genitourinary   Recurrent UTI    Has been placed on nightly Keflex by her OB/GYN.      Relevant Medications   cephALEXin (KEFLEX) 250 MG capsule   Other Visit Diagnoses     Needs flu shot       Relevant Orders   Flu Vaccine Trivalent High Dose (Fluad) (Completed)       Everlene Other DO [facility]

## 2023-04-18 ENCOUNTER — Other Ambulatory Visit: Payer: Self-pay | Admitting: Emergency Medicine

## 2023-04-18 DIAGNOSIS — K080 Exfoliation of teeth due to systemic causes: Secondary | ICD-10-CM | POA: Diagnosis not present

## 2023-04-22 ENCOUNTER — Other Ambulatory Visit: Payer: Self-pay | Admitting: Family Medicine

## 2023-04-30 ENCOUNTER — Telehealth: Payer: Self-pay | Admitting: Emergency Medicine

## 2023-04-30 MED ORDER — AMOXICILLIN-POT CLAVULANATE 875-125 MG PO TABS: 1.0000 | ORAL_TABLET | Freq: Two times a day (BID) | ORAL | 0 refills | Status: DC

## 2023-04-30 NOTE — Telephone Encounter (Signed)
Called patient.  Patient requesting refill on Augmentin 875 mg.  Per chart note from Dr. Delton Coombes on 01/05/2023:  You should continue your current regimen of saline nebulizers, Allegra, Nasacort, and a chest vest as needed, along with rotating clarithromycin and Augmentin for the first week of each month.  Return in about 6 months (around 07/06/2023) for With Dr. Delton Coombes, To review CT scan of the chest.   Will okay refill x one of Augmentin 875 mg.

## 2023-04-30 NOTE — Telephone Encounter (Signed)
PT states her pharm has been trying to reach [place] for an Augmentin refill for a week. Pharm is [facility]. She's upset.   Her # is [phone]

## 2023-05-07 ENCOUNTER — Ambulatory Visit: Payer: Medicare Other | Admitting: Neurology

## 2023-05-07 VITALS — BP 128/58 | HR 86 | Ht 64.0 in | Wt 103.0 lb

## 2023-05-07 DIAGNOSIS — G608 Other hereditary and idiopathic neuropathies: Secondary | ICD-10-CM | POA: Diagnosis not present

## 2023-05-07 DIAGNOSIS — G2581 Restless legs syndrome: Secondary | ICD-10-CM

## 2023-05-07 MED ORDER — GABAPENTIN 300 MG PO CAPS: 300.0000 mg | ORAL_CAPSULE | Freq: Two times a day (BID) | ORAL | 3 refills | Status: DC

## 2023-05-07 NOTE — Progress Notes (Signed)
Follow-up Visit   Date: 05/07/23   Mary [name] MRN: [id number] DOB: [birth year]   Interim History: Mary [name] s a 78 y.o. right-handed Caucasian female returning to the clinic for follow-up of neuropathy and restless leg syndrome.  The patient was accompanied to the clinic by self.  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  She underwent left hip replacement in August and suffered a fall 4-days post-operatively which resulted in left femur fracture and redo.  She is recovering well and walks unassisted.  She uses a cane for long distances.  She felt that her neuropathy became worse after her surgery and increased gabapentin to 300mg  at bedtime, but did not feel that it helped.  She reports having restless sensation in the legs around 4-6p and often has to get up to walk to get relief.  She does not have difficulty falling or staying asleep.   UPDATE 10/30/2022:  She is here for 6 month follow-up visit.  Since starting gabapentin 100mg  at 3pm, she noticed that it did help; but since she started having burning sensation in the legs earlier in the afternoon, she now takes gabapentin 100mg  and continues gabapentin 200mg  at 8pm.  She endorses low back pain.  UPDATE 05/07/2023:  She is here for 6 month visit.  Around 11a, she begins to have tingling sensation in the feet which progresses into the evening.  Symptoms are most bothersome around 6pm.  She currently takes gabapentin 200mg  around noon and 300mg  at bedtime.  RLS is doing well.  No interval falls or hospitalizations.   Medications:  Current Outpatient  Medications on File Prior to Visit  Medication Sig Dispense Refill   acetaminophen (TYLENOL) 500 MG tablet Take 1 tablet (500 mg total) by mouth every 8 (eight) hours. 30 tablet 0   amoxicillin-clavulanate (AUGMENTIN) 875-125 MG tablet Take 1 tablet by mouth 2 (two) times daily. 60 tablet 0   budesonide-formoterol (SYMBICORT) 80-4.5 MCG/ACT inhaler Inhale 2 puffs into the lungs in the morning and at bedtime. 1 each 12   CALCIUM-VITAMIN D-VITAMIN K PO Take 1 tablet by mouth in the morning.     cephALEXin (KEFLEX) 250 MG capsule Take 250 mg by mouth at bedtime.     clarithromycin (BIAXIN) 500 MG tablet TAKE 1/2 TABLET BY MOUTH TWICE DAILY 7 tablet 5   fexofenadine (ALLEGRA) 180 MG tablet Take 180 mg by mouth in the morning.     montelukast (SINGULAIR) 10 MG tablet TAKE 1 TABLET BY MOUTH DAILY AT BEDTIME 30 tablet 11   Multiple Vitamin (MULTIVITAMIN WITH MINERALS) TABS tablet Take 1 tablet by mouth in the morning.     Polyethyl Glycol-Propyl Glycol (LUBRICANT EYE DROPS) 0.4-0.3 % SOLN Place 1 drop into both eyes 3 (three) times daily as needed (dry/irritated eyes.).     sodium chloride (OCEAN) 0.65 % SOLN nasal spray Place 1 spray into both nostrils at bedtime.     sodium chloride, hypertonic, 3 % solution INHALE VIA NEBULIZER TWICE DAILY 240 mL 5   VENTOLIN HFA 108 (90 Base) MCG/ACT inhaler inhale TWO puffs into THE lungs EVERY 6 HOURS AS NEEDED FOR wheezing OR SHORTNESS OF BREATH 18 g 1   Wheat  Dextrin (BENEFIBER) POWD Take 1 Dose by mouth in the morning.     No current facility-administered medications on file prior to visit.    Allergies:  Allergies  Allergen Reactions   Levaquin [Levofloxacin] Other (See Comments)    Hip pain   Rifadin [Rifampin] Other (See Comments)    Flu-like symptoms   Bactrim [Sulfamethoxazole-Trimethoprim] Rash   Vibramycin [Doxycycline] Rash    Splotches on skin    Vital Signs:  BP (!) 128/58   Pulse 86   Ht 5\' 4"  (1.626 m)   Wt 103 lb (46.7 kg)    SpO2 95%   BMI 17.68 kg/m   Neurological Exam: MENTAL STATUS including orientation to time, place, person, recent and remote memory, attention span and concentration, language, and fund of knowledge is normal.  Speech is not dysarthric.  CRANIAL NERVES:  Normal conjugate, extra-ocular eye movements in all directions of gaze.  No ptosis.    MOTOR:  Motor strength is 5/5 in all extremities, including distally.  No atrophy, fasciculations or abnormal movements.  No pronator drift.  Tone is normal.    MSRs:                                           Right        Left brachioradialis 2+  2+  biceps 2+  2+  triceps 2+  2+  patellar 3+  3+  ankle jerk 1+  1+   SENSORY:  Vibration reduced at the ankles. Temperature intact throughout.   COORDINATION/GAIT:    Intact rapid alternating movements bilaterally.  Gait narrow based and stable.   Data: n/a     Thank you for allowing me to participate in patient's care.  If I can answer any additional questions, I would be pleased to do so.    Sincerely,    Yeng Frankie K. Allena Katz, DO

## 2023-05-14 DIAGNOSIS — H04123 Dry eye syndrome of bilateral lacrimal glands: Secondary | ICD-10-CM | POA: Diagnosis not present

## 2023-06-08 ENCOUNTER — Ambulatory Visit (HOSPITAL_COMMUNITY)
Admission: RE | Admit: 2023-06-08 | Discharge: 2023-06-08 | Disposition: A | Discharge status: Home / Self Care | Payer: Medicare Other | Source: Ambulatory Visit | Attending: Emergency Medicine | Admitting: Emergency Medicine

## 2023-06-08 DIAGNOSIS — R918 Other nonspecific abnormal finding of lung field: Secondary | ICD-10-CM | POA: Diagnosis not present

## 2023-06-08 DIAGNOSIS — J479 Bronchiectasis, uncomplicated: Secondary | ICD-10-CM | POA: Diagnosis not present

## 2023-06-08 DIAGNOSIS — J984 Other disorders of lung: Secondary | ICD-10-CM | POA: Diagnosis not present

## 2023-07-24 DIAGNOSIS — M25552 Pain in left hip: Secondary | ICD-10-CM | POA: Diagnosis not present

## 2023-08-01 ENCOUNTER — Other Ambulatory Visit: Payer: Self-pay | Admitting: Emergency Medicine

## 2023-08-01 ENCOUNTER — Ambulatory Visit: Admitting: Emergency Medicine

## 2023-08-01 ENCOUNTER — Encounter: Payer: Self-pay | Admitting: Emergency Medicine

## 2023-08-01 VITALS — BP 138/75 | HR 81 | Ht 64.5 in | Wt 105.2 lb

## 2023-08-01 DIAGNOSIS — J479 Bronchiectasis, uncomplicated: Secondary | ICD-10-CM | POA: Diagnosis not present

## 2023-08-01 DIAGNOSIS — K219 Gastro-esophageal reflux disease without esophagitis: Secondary | ICD-10-CM | POA: Diagnosis not present

## 2023-08-01 DIAGNOSIS — J4530 Mild persistent asthma, uncomplicated: Secondary | ICD-10-CM

## 2023-08-01 DIAGNOSIS — A310 Pulmonary mycobacterial infection: Secondary | ICD-10-CM | POA: Diagnosis not present

## 2023-08-01 DIAGNOSIS — J45909 Unspecified asthma, uncomplicated: Secondary | ICD-10-CM | POA: Insufficient documentation

## 2023-08-01 DIAGNOSIS — J301 Allergic rhinitis due to pollen: Secondary | ICD-10-CM

## 2023-08-01 MED ORDER — AMOXICILLIN-POT CLAVULANATE 875-125 MG PO TABS: 1.0000 | ORAL_TABLET | Freq: Two times a day (BID) | ORAL | 0 refills | Status: AC

## 2023-08-01 MED ORDER — BUDESONIDE-FORMOTEROL FUMARATE 80-4.5 MCG/ACT IN AERO: 2.0000 | INHALATION_SPRAY | Freq: Two times a day (BID) | RESPIRATORY_TRACT | 12 refills | Status: AC

## 2023-08-01 MED ORDER — MONTELUKAST SODIUM 10 MG PO TABS: 10.0000 mg | ORAL_TABLET | Freq: Every day | ORAL | 11 refills | Status: AC

## 2023-08-01 NOTE — Assessment & Plan Note (Signed)
 She benefited when we added the Symbicort  last time.  Plan continue.

## 2023-08-01 NOTE — Assessment & Plan Note (Signed)
 Doing well on her current regimen.  Plan to continue.

## 2023-08-01 NOTE — Progress Notes (Signed)
 Patient ID: Mary [name], female    DOB: 1945/09/24, 78 y.o.   MRN: [id number] HPI   ROV 08/01/2023 --follow-up visit 78 year old woman whom I have followed for bronchiectasis with Mycobacterium avium colonization, Proteus, MRSA.  Also with upper airway irritation syndrome from GERD and allergic rhinitis.  Finally she also has a Zenker's diverticulum that may contribute some to upper airway irritation.  She is on 3% saline nebulizer qam, chest vest as needed, rotating clarithromycin  and Augmentin  for the first week of each month.  She uses Allegra and Nasacort , singulair .  We added symbicort  last time and she believes that it has helped her. We have been following bronchiectasis and pulmonary nodular disease.  She had a 4 mm peripheral right upper lobe nodule that increased in size on her most recent scan so we repeated it as below. Her breathing is a bit better and her cough is less. She is having allergy , the mucous from her chest is not colored.   CT chest 06/08/2023 reviewed by me, shows interval decrease in size of multiple bilateral pulmonary nodules in particular the 8 mm peripheral right upper lobe nodule has decreased in size, now back down to 4 mm.  There are scattered areas of architectural distortion and cylindrical bronchiectasis consistent with her Mycobacterium avium   Vitals:   08/01/23 1348 08/01/23 1349  BP: (!) 151/74 138/75  Pulse: 81   SpO2: 93%   Weight: 105 lb 3.2 oz (47.7 kg)   Height: 5' 4.5" (1.638 m)     Gen: Pleasant, Thin woman, in no distress,  normal affect  ENT: No lesions,  mouth clear,  oropharynx clear, no postnasal drip  Neck: No JVD, no stridor  Lungs: No use of accessory muscles, no wheezes or crackles  Cardiovascular: RRR, heart sounds normal, no murmur or gallops, no peripheral edema  Musculoskeletal: No deformities, no cyanosis or clubbing  Neuro: alert, non focal  Skin: Warm, no lesions or rash    Bronchiectasis (HCC) Use your saline  nebulizer treatment every morning.  You could increase this to twice a day if you need to do so to help with mucus clearance Use your chest vest as you need it to help with mucus clearance Continue your rotating antibiotics for the first week of each month: Clarithromycin  and Augmentin  Continue Allegra, Nasacort  and Singulair  as you have been taking them. Keep your flu shot up-to-date.  Consider also getting the COVID-19 vaccine in the RSV vaccine. Follow with Dr. Baldwin Levee in 1 year, sooner if you have any problems.    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  No real evidence for progression of bronchiectasis.  Continue to follow  Allergic rhinitis due to pollen Doing well on her current regimen.  Plan to continue.  GERD (gastroesophageal reflux disease) Continue current therapy  Asthma She benefited when we added the Symbicort  last time.  Plan continue.   Time spent 31 minutes    Racheal Buddle, MD, PhD 08/01/2023, 2:17 PM [facility] [phone] or if no answer [phone]

## 2023-08-01 NOTE — Assessment & Plan Note (Signed)
 Continue current therapy

## 2023-08-01 NOTE — Assessment & Plan Note (Signed)
 Pulmonary nodule disease improved on her most recent CT.  No real evidence for progression of bronchiectasis.  Continue to follow

## 2023-08-01 NOTE — Patient Instructions (Addendum)
 We reviewed your CT scan of the chest today.  Your inflammatory nodules are improved compared with your previous CT scan.  This is good news. Please continue your Symbicort  2 puffs twice a day.  Rinse and gargle after using. Use your saline nebulizer treatment every morning.  You could increase this to twice a day if you need to do so to help with mucus clearance Use your chest vest as you need it to help with mucus clearance Continue your rotating antibiotics for the first week of each month: Clarithromycin  and Augmentin  Continue Allegra, Nasacort  and Singulair  as you have been taking them. Keep your flu shot up-to-date.  Consider also getting the COVID-19 vaccine in the RSV vaccine. Follow with Dr. Baldwin Levee in 1 year, sooner if you have any problems.

## 2023-08-01 NOTE — Assessment & Plan Note (Addendum)
 Use your saline nebulizer treatment every morning.  You could increase this to twice a day if you need to do so to help with mucus clearance Use your chest vest as you need it to help with mucus clearance Continue your rotating antibiotics for the first week of each month: Clarithromycin  and Augmentin  Continue Allegra, Nasacort  and Singulair  as you have been taking them. Keep your flu shot up-to-date.  Consider also getting the COVID-19 vaccine in the RSV vaccine. Follow with Dr. Baldwin Levee in 1 year, sooner if you have any problems.

## 2023-08-07 DIAGNOSIS — M545 Low back pain, unspecified: Secondary | ICD-10-CM | POA: Diagnosis not present

## 2023-09-04 DIAGNOSIS — Z1231 Encounter for screening mammogram for malignant neoplasm of breast: Secondary | ICD-10-CM | POA: Diagnosis not present

## 2023-09-04 LAB — HM MAMMOGRAPHY

## 2023-10-01 ENCOUNTER — Other Ambulatory Visit: Payer: Self-pay | Admitting: Emergency Medicine

## 2023-10-02 ENCOUNTER — Ambulatory Visit: Admitting: Neurology

## 2023-10-02 ENCOUNTER — Encounter: Payer: Self-pay | Admitting: Neurology

## 2023-10-02 VITALS — BP 126/68 | HR 84 | Ht 64.5 in | Wt 102.0 lb

## 2023-10-02 DIAGNOSIS — G2581 Restless legs syndrome: Secondary | ICD-10-CM | POA: Diagnosis not present

## 2023-10-02 DIAGNOSIS — G608 Other hereditary and idiopathic neuropathies: Secondary | ICD-10-CM | POA: Diagnosis not present

## 2023-10-02 MED ORDER — GABAPENTIN 300 MG PO CAPS: ORAL_CAPSULE | ORAL | 3 refills | Status: AC

## 2023-10-02 NOTE — Progress Notes (Signed)
 Follow-up Visit   Date: 10/02/23   Mary [name] MRN: [id number] DOB: [birth year]   Interim History: Mary [name] s a 78 y.o. right-handed Caucasian female returning to the clinic for follow-up of neuropathy and restless leg syndrome.  The patient was accompanied to the clinic by self.  IMPRESSION/PLAN: Idiopathic peripheral neuropathy with worsening feet parethesias in the evening  - increase gabapentin  to 600mg  at noon and continue 300mg  at bedtime.  If it makes her too sleepy, she can take 1 tablet at noon and 1 tablet at 4pm. Restless leg syndrome, stable Lumbar spondylosis with possible canal stenosis  Return to clinic in 6 months  --------------------------------------------------------- UPDATE 05/01/2022:  She is here for follow-up visit.  She underwent left hip replacement in August and suffered a fall 4-days post-operatively which resulted in left femur fracture and redo.  She is recovering well and walks unassisted.  She uses a cane for long distances.  She felt that her neuropathy became worse after her surgery and increased gabapentin  to 300mg  at bedtime, but did not feel that it helped.  She reports having restless sensation in the legs around 4-6p and often has to get up to walk to get relief.  She does not have difficulty falling or staying asleep.   UPDATE 10/30/2022:  She is here for 6 month follow-up visit.  Since starting gabapentin  100mg  at 3pm, she noticed that it did help; but since she started having burning sensation in the legs earlier in the afternoon, she now takes gabapentin  100mg  and continues gabapentin  200mg  at 8pm.  She endorses low back pain.  UPDATE 05/07/2023:  She is here for 6 month visit.  Around 11a, she begins to have tingling sensation in the feet which progresses into the evening.  Symptoms are most bothersome around 6pm.  She currently takes gabapentin  200mg  around noon and 300mg  at bedtime.  RLS is doing well.  No interval falls or  hospitalizations.   UPDATE 10/02/2023:  She is here for 6 month visit.  At her last visit, gabapentin  was increased to 300mg  twice daily which has helped, but does not alleviate her symptoms.  She has relief from noon until 5pm, but then has worsening tingling in the evening until she takes her 8p dose.  She is tolerating the medication well.  No new complaints.   Medications:  Current Outpatient Medications on File Prior to Visit  Medication Sig Dispense Refill   acetaminophen  (TYLENOL ) 500 MG tablet Take 1 tablet (500 mg total) by mouth every 8 (eight) hours. 30 tablet 0   amoxicillin -clavulanate (AUGMENTIN ) 875-125 MG tablet Take 1 tablet by mouth 2 (two) times daily. 60 tablet 0   budesonide -formoterol  (SYMBICORT ) 80-4.5 MCG/ACT inhaler Inhale 2 puffs into the lungs in the morning and at bedtime. 1 each 12   CALCIUM-VITAMIN D -VITAMIN K PO Take 1 tablet by mouth in the morning.     cephALEXin  (KEFLEX ) 250 MG capsule Take 250 mg by mouth at bedtime.     clarithromycin  (BIAXIN ) 500 MG tablet TAKE 1/2 TABLET BY MOUTH TWICE DAILY 7 tablet 5   fexofenadine (ALLEGRA) 180 MG tablet Take 180 mg by mouth in the morning.     gabapentin  (NEURONTIN ) 300 MG capsule Take 1 capsule (300 mg total) by mouth 2 (two) times daily. 180 capsule 3   montelukast  (SINGULAIR ) 10 MG tablet Take 1 tablet (10 mg total) by mouth at bedtime. 30 tablet 11   Multiple Vitamin (MULTIVITAMIN WITH MINERALS) TABS tablet Take  1 tablet by mouth in the morning.     Polyethyl Glycol-Propyl Glycol (LUBRICANT EYE DROPS) 0.4-0.3 % SOLN Place 1 drop into both eyes 3 (three) times daily as needed (dry/irritated eyes.).     sodium chloride  (OCEAN) 0.65 % SOLN nasal spray Place 1 spray into both nostrils at bedtime.     sodium chloride  HYPERTONIC 3 % nebulizer solution INHALE VIA NEBULIZER TWICE DAILY 240 mL 5   VENTOLIN  HFA 108 (90 Base) MCG/ACT inhaler inhale TWO puffs into THE lungs EVERY 6 HOURS AS NEEDED FOR wheezing OR SHORTNESS OF  BREATH 18 g 1   Wheat Dextrin (BENEFIBER) POWD Take 1 Dose by mouth in the morning.     No current facility-administered medications on file prior to visit.    Allergies:  Allergies  Allergen Reactions   Levaquin  [Levofloxacin ] Other (See Comments)    Hip pain   Rifadin  [Rifampin ] Other (See Comments)    Flu-like symptoms   Bactrim [Sulfamethoxazole-Trimethoprim] Rash   Vibramycin  [Doxycycline ] Rash    Splotches on skin    Vital Signs:  BP 126/68   Pulse 84   Ht 5' 4.5 (1.638 m)   Wt 102 lb (46.3 kg)   SpO2 95%   BMI 17.24 kg/m   Neurological Exam: MENTAL STATUS including orientation to time, place, person, recent and remote memory, attention span and concentration, language, and fund of knowledge is normal.  Speech is not dysarthric.  CRANIAL NERVES:  Normal conjugate, extra-ocular eye movements in all directions of gaze.  No ptosis.    MOTOR:  Motor strength is 5/5 in all extremities, including distally.  No atrophy, fasciculations or abnormal movements.  No pronator drift.  Tone is normal.    MSRs:                                           Right        Left brachioradialis 2+  2+  biceps 2+  2+  triceps 2+  2+  patellar 3+  3+  ankle jerk 1+  1+   SENSORY:  Vibration reduced at the ankles.   COORDINATION/GAIT:    Intact rapid alternating movements bilaterally.  Gait narrow based and stable, unassisted.   Data: n/a     Thank you for allowing me to participate in patient's care.  If I can answer any additional questions, I would be pleased to do so.    Sincerely,    Keora Eccleston K. Tobie, DO

## 2023-10-23 DIAGNOSIS — K080 Exfoliation of teeth due to systemic causes: Secondary | ICD-10-CM | POA: Diagnosis not present

## 2023-11-05 ENCOUNTER — Ambulatory Visit: Payer: Medicare Other | Admitting: Neurology

## 2023-11-19 DIAGNOSIS — H524 Presbyopia: Secondary | ICD-10-CM | POA: Diagnosis not present

## 2023-11-19 DIAGNOSIS — H353132 Nonexudative age-related macular degeneration, bilateral, intermediate dry stage: Secondary | ICD-10-CM | POA: Diagnosis not present

## 2023-12-17 DIAGNOSIS — M1611 Unilateral primary osteoarthritis, right hip: Secondary | ICD-10-CM | POA: Diagnosis not present

## 2023-12-26 DIAGNOSIS — K9083 Intestinal failure: Secondary | ICD-10-CM | POA: Diagnosis not present

## 2023-12-26 DIAGNOSIS — M049 Autoinflammatory syndrome, unspecified: Secondary | ICD-10-CM | POA: Diagnosis not present

## 2024-01-07 DIAGNOSIS — M8000XD Age-related osteoporosis with current pathological fracture, unspecified site, subsequent encounter for fracture with routine healing: Secondary | ICD-10-CM | POA: Diagnosis not present

## 2024-01-07 DIAGNOSIS — M810 Age-related osteoporosis without current pathological fracture: Secondary | ICD-10-CM | POA: Diagnosis not present

## 2024-01-11 ENCOUNTER — Ambulatory Visit (INDEPENDENT_AMBULATORY_CARE_PROVIDER_SITE_OTHER): Payer: Self-pay | Admitting: Family Medicine

## 2024-01-11 ENCOUNTER — Encounter: Payer: Self-pay | Admitting: Family Medicine

## 2024-01-11 VITALS — BP 135/83 | HR 79 | Ht 64.5 in | Wt 102.0 lb

## 2024-01-11 DIAGNOSIS — Z8781 Personal history of (healed) traumatic fracture: Secondary | ICD-10-CM | POA: Insufficient documentation

## 2024-01-11 DIAGNOSIS — Z23 Encounter for immunization: Secondary | ICD-10-CM | POA: Diagnosis not present

## 2024-01-11 DIAGNOSIS — M810 Age-related osteoporosis without current pathological fracture: Secondary | ICD-10-CM

## 2024-01-11 DIAGNOSIS — G608 Other hereditary and idiopathic neuropathies: Secondary | ICD-10-CM | POA: Diagnosis not present

## 2024-01-11 DIAGNOSIS — Z136 Encounter for screening for cardiovascular disorders: Secondary | ICD-10-CM | POA: Diagnosis not present

## 2024-01-11 DIAGNOSIS — J479 Bronchiectasis, uncomplicated: Secondary | ICD-10-CM

## 2024-01-11 DIAGNOSIS — Z13220 Encounter for screening for lipoid disorders: Secondary | ICD-10-CM

## 2024-01-11 MED ORDER — COVID-19 MRNA VAC-TRIS(PFIZER) 30 MCG/0.3ML IM SUSY: 0.3000 mL | PREFILLED_SYRINGE | Freq: Once | INTRAMUSCULAR | 0 refills | Status: AC

## 2024-01-11 MED ORDER — RSVPREF3 VAC RECOMB ADJUVANTED 120 MCG/0.5ML IM SUSR: 0.5000 mL | Freq: Once | INTRAMUSCULAR | 0 refills | Status: AC

## 2024-01-11 NOTE — Patient Instructions (Signed)
 Labs ordered.  Vaccines at the pharmacy.  Follow up in 6 months.

## 2024-01-12 LAB — LIPID PANEL
Chol/HDL Ratio: 2.2 ratio (ref 0.0–4.4)
Cholesterol, Total: 171 mg/dL (ref 100–199)
HDL: 79 mg/dL (ref >39)
LDL Chol Calc (NIH): 81 mg/dL (ref 0–99)
Triglycerides: 55 mg/dL (ref 0–149)
VLDL Cholesterol Cal: 11 mg/dL (ref 5–40)

## 2024-01-13 ENCOUNTER — Ambulatory Visit: Payer: Self-pay | Admitting: Family Medicine

## 2024-01-13 NOTE — Assessment & Plan Note (Signed)
 Prescriptions for RSV and COVID vaccines given to the patient.

## 2024-01-13 NOTE — Progress Notes (Signed)
 Subjective:  Patient ID: Mary [name], female    DOB: 02/14/1946  Age: 78 y.o. MRN: [id number]  CC:   Chief Complaint  Patient presents with   Care Management    Follow up     HPI:  78 year old female presents for follow up.  Overall doing well. She does have a few questions/concerns today.  Patient follows with Dr. Orpha regarding Osteoporosis. She did not tolerate Reclast . Treatment has been advised given quite severe osteoporosis. She has declined. Will discuss today.  She has had recent labs. Will review today.   Patient inquiring about recommend vaccines. Given her pulmonary history and age, it is recommended that she have Flu, RSV, and COVID vaccines. She does not desire Shingles vaccine.  She continues to be troubled by neuropathy. This is her major complaint/issue at this time. She is on Gabapentin  (managed by neurology).  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   Bronchiectasis (HCC) 12/06/2006    Social Hx   Social History   Socioeconomic History   Marital status: Married    Spouse name: Not on file   Number of children: 1   Years of education: Not on file   Highest education level: Not on file  Occupational History   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  Tobacco Use   Smoking status: Never   Smokeless tobacco: Never  Vaping Use   Vaping status: Never Used  Substance and Sexual Activity   Alcohol  use: Not Currently    Comment: Occasional wine with meal   Drug use: No   Sexual activity: Not Currently  Other Topics Concern   Not on  file  Social History Narrative   Right Handed   Lives in one story home    Drinks caffeine    Social Drivers of Health   Financial Resource Strain: Not on file  Food Insecurity: Not on file  Transportation Needs: Not on file  Physical Activity: Not on file  Stress: Not on file  Social Connections: Not on file    Review of Systems Per HPI  Objective:  BP 135/83   Pulse 79   Ht 5' 4.5 (1.638 m)   Wt 102 lb (46.3 kg)   SpO2 98%   BMI 17.24 kg/m      01/11/2024    2:32 PM 10/02/2023    2:19 PM 08/01/2023    1:49 PM  BP/Weight  Systolic BP 135 126 138  Diastolic BP 83 68 75  Wt. (Lbs) 102 102   BMI 17.24 kg/m2 17.24 kg/m2     Physical Exam Vitals and nursing note reviewed.  Constitutional:      Appearance: Normal appearance.     Comments: Thin.  Eyes:     General:        Right eye: No discharge.        Left eye: No discharge.     Conjunctiva/sclera: Conjunctivae normal.  Cardiovascular:     Rate and Rhythm: Normal rate and regular rhythm.  Pulmonary:     Effort: Pulmonary effort is normal.     Breath sounds: Normal breath sounds.  Neurological:     Mental Status: She is alert.  Psychiatric:        Mood and Affect: Mood normal.        Behavior: Behavior normal.     Lab Results  Component Value Date   WBC 7.5 05/08/2022   HGB 13.4 05/08/2022   HCT 40.3 05/08/2022   PLT 267 05/08/2022   GLUCOSE 110 (H) 05/08/2022   CHOL 171 01/11/2024   TRIG 55 01/11/2024   HDL 79 01/11/2024   LDLCALC 81 01/11/2024   ALT 18 05/08/2022   AST 29 05/08/2022   NA 138 05/08/2022   K 4.1 05/08/2022   CL 99 05/08/2022   CREATININE 0.62 05/08/2022   BUN 11 05/08/2022   CO2 23 05/08/2022   TSH 1.830 05/08/2022   HGBA1C 5.6 05/08/2022     Assessment & Plan:  Age-related osteoporosis without current pathological fracture Assessment & Plan: I have strongly recommended treatment. We discuss treatment options today (predominantly Prolia). She declines.  Advised  weight bearing exercise.   Encounter for immunization -     Flu vaccine HIGH DOSE PF(Fluzone  Trivalent)  Screening, lipid -     Lipid panel  Peripheral sensory neuropathy Assessment & Plan: Continue Gabapentin .   Bronchiectasis without complication [facility]) Assessment & Plan: Prescriptions for RSV and COVID vaccines given to the patient.  Orders: -     COVID-19 mRNA Vac-TriS(Pfizer); Inject 0.3 mLs into the muscle once for 1 dose.  Dispense: 0.3 mL; Refill: 0 -     RSVPreF3 Vac Recomb Adjuvanted; Inject 0.5 mLs into the muscle once for 1 dose.  Dispense: 0.5 mL; Refill: 0    Follow-up:  6 months  Denny Lave Bluford DO [facility]

## 2024-01-13 NOTE — Assessment & Plan Note (Signed)
 I have strongly recommended treatment. We discuss treatment options today (predominantly Prolia). She declines.  Advised weight bearing exercise.

## 2024-01-13 NOTE — Assessment & Plan Note (Signed)
 Continue Gabapentin

## 2024-03-06 DIAGNOSIS — R0902 Hypoxemia: Secondary | ICD-10-CM | POA: Diagnosis not present

## 2024-03-06 DIAGNOSIS — Z79899 Other long term (current) drug therapy: Secondary | ICD-10-CM | POA: Diagnosis not present

## 2024-03-06 DIAGNOSIS — Y9301 Activity, walking, marching and hiking: Secondary | ICD-10-CM | POA: Diagnosis not present

## 2024-03-06 DIAGNOSIS — Z66 Do not resuscitate: Secondary | ICD-10-CM | POA: Diagnosis not present

## 2024-03-06 DIAGNOSIS — E162 Hypoglycemia, unspecified: Secondary | ICD-10-CM | POA: Diagnosis not present

## 2024-03-06 DIAGNOSIS — Y9269 Other specified industrial and construction area as the place of occurrence of the external cause: Secondary | ICD-10-CM | POA: Diagnosis not present

## 2024-03-06 DIAGNOSIS — S42221A 2-part displaced fracture of surgical neck of right humerus, initial encounter for closed fracture: Secondary | ICD-10-CM | POA: Diagnosis not present

## 2024-03-06 DIAGNOSIS — M25559 Pain in unspecified hip: Secondary | ICD-10-CM | POA: Diagnosis not present

## 2024-03-06 DIAGNOSIS — Z96642 Presence of left artificial hip joint: Secondary | ICD-10-CM | POA: Diagnosis not present

## 2024-03-06 DIAGNOSIS — I959 Hypotension, unspecified: Secondary | ICD-10-CM | POA: Diagnosis not present

## 2024-03-06 DIAGNOSIS — S42211A Unspecified displaced fracture of surgical neck of right humerus, initial encounter for closed fracture: Secondary | ICD-10-CM | POA: Diagnosis not present

## 2024-03-06 DIAGNOSIS — S0990XA Unspecified injury of head, initial encounter: Secondary | ICD-10-CM | POA: Diagnosis not present

## 2024-03-06 DIAGNOSIS — J449 Chronic obstructive pulmonary disease, unspecified: Secondary | ICD-10-CM | POA: Diagnosis not present

## 2024-03-06 DIAGNOSIS — M80851A Other osteoporosis with current pathological fracture, right femur, initial encounter for fracture: Secondary | ICD-10-CM | POA: Diagnosis not present

## 2024-03-06 DIAGNOSIS — J479 Bronchiectasis, uncomplicated: Secondary | ICD-10-CM | POA: Diagnosis not present

## 2024-03-06 DIAGNOSIS — S32391A Other fracture of right ilium, initial encounter for closed fracture: Secondary | ICD-10-CM | POA: Diagnosis not present

## 2024-03-06 DIAGNOSIS — R109 Unspecified abdominal pain: Secondary | ICD-10-CM | POA: Diagnosis not present

## 2024-03-06 DIAGNOSIS — Z87440 Personal history of urinary (tract) infections: Secondary | ICD-10-CM | POA: Diagnosis not present

## 2024-03-06 DIAGNOSIS — M810 Age-related osteoporosis without current pathological fracture: Secondary | ICD-10-CM | POA: Diagnosis not present

## 2024-03-06 DIAGNOSIS — S32301A Unspecified fracture of right ilium, initial encounter for closed fracture: Secondary | ICD-10-CM | POA: Diagnosis not present

## 2024-03-06 DIAGNOSIS — W19XXXA Unspecified fall, initial encounter: Secondary | ICD-10-CM | POA: Diagnosis not present

## 2024-03-06 DIAGNOSIS — M25551 Pain in right hip: Secondary | ICD-10-CM | POA: Diagnosis not present

## 2024-03-06 DIAGNOSIS — W010XXA Fall on same level from slipping, tripping and stumbling without subsequent striking against object, initial encounter: Secondary | ICD-10-CM | POA: Diagnosis not present

## 2024-03-06 DIAGNOSIS — G629 Polyneuropathy, unspecified: Secondary | ICD-10-CM | POA: Diagnosis not present

## 2024-03-06 DIAGNOSIS — M25521 Pain in right elbow: Secondary | ICD-10-CM | POA: Diagnosis not present

## 2024-03-06 DIAGNOSIS — S42291A Other displaced fracture of upper end of right humerus, initial encounter for closed fracture: Secondary | ICD-10-CM | POA: Diagnosis not present

## 2024-03-06 DIAGNOSIS — Z7401 Bed confinement status: Secondary | ICD-10-CM | POA: Diagnosis not present

## 2024-03-06 DIAGNOSIS — R739 Hyperglycemia, unspecified: Secondary | ICD-10-CM | POA: Diagnosis not present

## 2024-03-06 DIAGNOSIS — Z7951 Long term (current) use of inhaled steroids: Secondary | ICD-10-CM | POA: Diagnosis not present

## 2024-03-06 DIAGNOSIS — R531 Weakness: Secondary | ICD-10-CM | POA: Diagnosis not present

## 2024-03-06 DIAGNOSIS — R4182 Altered mental status, unspecified: Secondary | ICD-10-CM | POA: Diagnosis not present

## 2024-03-12 DIAGNOSIS — R531 Weakness: Secondary | ICD-10-CM | POA: Diagnosis not present

## 2024-03-12 DIAGNOSIS — M80851A Other osteoporosis with current pathological fracture, right femur, initial encounter for fracture: Secondary | ICD-10-CM | POA: Diagnosis not present

## 2024-03-12 DIAGNOSIS — J449 Chronic obstructive pulmonary disease, unspecified: Secondary | ICD-10-CM | POA: Diagnosis not present

## 2024-03-12 DIAGNOSIS — E162 Hypoglycemia, unspecified: Secondary | ICD-10-CM | POA: Diagnosis not present

## 2024-03-24 ENCOUNTER — Telehealth: Payer: Self-pay

## 2024-03-24 ENCOUNTER — Ambulatory Visit: Admitting: Family Medicine

## 2024-03-24 NOTE — Transitions of Care (Post Inpatient/ED Visit) (Signed)
 "  03/24/2024  Name: Mary [name] MRN: [id number] DOB: Sep 27, 1945  Today's TOC FU Call Status: Today's TOC FU Call Status:: Successful TOC FU Call Completed TOC FU Call Complete Date: 03/24/24  Patient's Name and Date of Birth confirmed. DOB, Name  Transition Care Management Follow-up Telephone Call Date of Discharge: 03/21/24 Discharge Facility: Other [facility]) Name of Other (Non[facility]) Discharge Facility: [facility] Type of Discharge: Inpatient Admission Primary Inpatient Discharge Diagnosis:: Fracture of right femur How have you been since you were released from the hospital?: Better Any questions or concerns?: No  Items Reviewed: Did you receive and understand the discharge instructions provided?: Yes Medications obtained,verified, and reconciled?: Yes (Medications Reviewed) Any new allergies since your discharge?: No Dietary orders reviewed?: NA Do you have support at home?: Yes People in Home [RPT]: spouse  Medications Reviewed Today: Medications Reviewed Today     Reviewed by Lavelle Charmaine NOVAK, LPN (Licensed Practical Nurse) on 03/24/24 at 1005  Med List Status: <None>   Medication Order Taking? Sig Documenting Provider Last Dose Status Informant  acetaminophen  (TYLENOL ) 500 MG tablet [id number]  Take 1 tablet (500 mg total) by mouth every 8 (eight) hours. Krishnan, Gokul, MD  Active   amoxicillin -clavulanate (AUGMENTIN ) 875-125 MG tablet [id number]  Take 1 tablet by mouth 2 (two) times daily. Byrum, Robert S, MD  Active   budesonide -formoterol  (SYMBICORT ) 80-4.5 MCG/ACT inhaler [id number]  Inhale 2 puffs into the lungs in the morning and at bedtime. Shelah Lamar RAMAN, MD  Active   CALCIUM-VITAMIN D -VITAMIN K PO [id number]  Take 1 tablet by mouth in the morning. [provider]  Active Self  cephALEXin  (KEFLEX ) 250 MG capsule [id number]  Take 250 mg by mouth at bedtime. [provider]  Active   clarithromycin  (BIAXIN ) 500 MG tablet  [id number]  TAKE 1/2 TABLET BY MOUTH TWICE DAILY Byrum, Robert S, MD  Active   fexofenadine (ALLEGRA) 180 MG tablet [id number]  Take 180 mg by mouth in the morning. [provider]  Active Self  gabapentin  (NEURONTIN ) 300 MG capsule [id number]  Take 2 tablets at noon and 1 tablet at bedtime. Patel, Donika K, DO  Active   montelukast  (SINGULAIR ) 10 MG tablet [id number]  Take 1 tablet (10 mg total) by mouth at bedtime. Byrum, Robert S, MD  Active   Multiple Vitamin (MULTIVITAMIN WITH MINERALS) TABS tablet [id number]  Take 1 tablet by mouth in the morning. [provider]  Active Self  Polyethyl Glycol-Propyl Glycol (LUBRICANT EYE DROPS) 0.4-0.3 % SOLN [id number]  Place 1 drop into both eyes 3 (three) times daily as needed (dry/irritated eyes.). [provider]  Active Self  sodium chloride  (OCEAN) 0.65 % SOLN nasal spray [id number]  Place 1 spray into both nostrils at bedtime. [provider]  Active Self  sodium chloride  HYPERTONIC 3 % nebulizer solution [id number]  INHALE 3ML VIA NEBULIZER TWICE DAILY Shelah Lamar RAMAN, MD  Active   VENTOLIN  HFA 108 (90 Base) MCG/ACT inhaler [id number]  inhale TWO puffs into THE lungs EVERY 6 HOURS AS NEEDED FOR wheezing OR SHORTNESS OF BREATH Weston, Jayce G, DO  Active   Wheat Dextrin (BENEFIBER) POWD [id number]  Take 1 Dose by mouth in the morning.  Patient not taking: Reported on 01/11/2024   [provider]  Active Self            Home Care and Equipment/Supplies: Were Home Health Services Ordered?: Yes Name of Home Health Agency:: [facility] Has Agency set up a time to come  to your home?: Yes First Home Health Visit Date: 03/21/24 Any new equipment or medical supplies ordered?: NA  Functional Questionnaire: Do you need assistance with bathing/showering or dressing?: Yes Do you need assistance with meal preparation?: Yes Do you need assistance with eating?: No Do you have difficulty maintaining continence: No Do you  need assistance with getting out of bed/getting out of a chair/moving?: Yes Do you have difficulty managing or taking your medications?: No  Follow up appointments reviewed: PCP Follow-up appointment confirmed?: Yes Date of PCP follow-up appointment?: 04/01/24 Follow-up Provider: Dr. Bluford [facility] Follow-up appointment confirmed?: NA Do you need transportation to your follow-up appointment?: No Do you understand care options if your condition(s) worsen?: Yes-patient verbalized understanding    SIGNATURE Charmaine Bloodgood, LPN [facility] Health Advisor [facility] You Are. We Are. [facility] [phone].  "

## 2024-04-01 ENCOUNTER — Telehealth: Payer: Self-pay | Admitting: Pharmacist

## 2024-04-01 ENCOUNTER — Ambulatory Visit (INDEPENDENT_AMBULATORY_CARE_PROVIDER_SITE_OTHER): Admitting: Family Medicine

## 2024-04-01 VITALS — BP 147/73 | HR 97 | Temp 97.7°F | Ht 64.5 in | Wt 100.0 lb

## 2024-04-01 DIAGNOSIS — M8000XS Age-related osteoporosis with current pathological fracture, unspecified site, sequela: Secondary | ICD-10-CM

## 2024-04-01 DIAGNOSIS — M8000XD Age-related osteoporosis with current pathological fracture, unspecified site, subsequent encounter for fracture with routine healing: Secondary | ICD-10-CM

## 2024-04-01 NOTE — Telephone Encounter (Signed)
" ° ° °  Discussed with PCP tx options for osteoporosis Has refused treatments in the past, mostly due to cost; Most are unaffordable on Medicare Has seen endo; Will review notes from endo Has been on alendronate  in the past My recommendation would be Forteo daily x2 yrs then prolia indefinitely  Forteo PAP [facility] 2 hip fractures in past/high risk Recommended [id number] to Pharmacy  Will run BCBS benefits in 04/2024 to determine copay  DEXA per endo 05/2022 EXAM: DUAL X-RAY ABSORPTIOMETRY (DXA) FOR BONE MINERAL DENSITY   IMPRESSION: Your patient Mary [name] completed a BMD test on 05/29/2022 using the [employer] DXA System (software version: 14.10) manufactured by [employer]. The following summarizes the results of our evaluation. Technologist:AMR   PATIENT BIOGRAPHICAL: Name: [name], [name] Patient ID: [id number] Birth Date: 09-25-1945 Height: 64.0 in. Gender: Female Exam Date: 05/29/2022 Weight: 101.0 lbs. Indications: Caucasian, Follow up Osteoporosis, Height Loss, History of Fracture (Adult), Low Body Weight, Parent Hip Fracture, Post Menopausal Fractures: Femur, Pelvis Treatments: Calcium, Multivitamin, Vitamin D    DENSITOMETRY RESULTS: Site         Region     Measured Date Measured Age WHO Classification Young Adult T-score BMD         %Change vs. Previous Significant Change (*)   Right Femur Neck 05/29/2022 76.5 Osteoporosis -3.4 0.560 g/cm2 -4.9% - Right Femur Neck 07/16/2019 73.6 Osteoporosis -3.2 0.589 g/cm2 -3.0% - Right Femur Neck 09/06/2015 69.8 Osteoporosis -3.1 0.607 g/cm2 -4.3% - Right Femur Neck 02/21/2012 66.2 Osteoporosis -2.9 0.634 g/cm2 - -   Right Femur Total 05/29/2022 76.5 Osteoporosis -4.1 0.489 g/cm2 -2.2% - Right Femur Total 07/16/2019 73.6 Osteoporosis -4.0 0.500 g/cm2 -6.5% Yes Right Femur Total 09/06/2015 69.8 Osteoporosis -3.7 0.535 g/cm2 -13.6% Yes Right Femur Total 02/21/2012 66.2 Osteoporosis -3.1 0.619  g/cm2 - -   Left Forearm Radius 33% 05/29/2022 76.5 Osteoporosis -4.3 0.404 g/cm2 -8.6% - Left Forearm Radius 33% 07/16/2019 73.6 Osteoporosis -3.8 0.441 g/cm2 -4.7% - Left Forearm Radius 33% 09/06/2015 69.8 Osteoporosis -3.5 0.463 g/cm2 - -   ASSESSMENT: The BMD measured at Forearm Radius 33% is 0.404 g/cm2 with a T-score of -4.3. This patient is considered osteoporotic according to World Health Organization [facility]) criteria. The scan quality is good. Compared with the prior study on 07/16/19, the BMD of the rt. total hip shows no statistically significant change. Lumbar spine was excluded due to advanced degenerative changes. Lt. hip was excluded due to surgical repair.   Jailynn Lavalais Dattero Charisma Charlot, PharmD, BCACP, CPP Clinical Pharmacist, [facility]  "

## 2024-04-01 NOTE — Patient Instructions (Signed)
 I am reaching out to my pharmacist - Mliss Griffin.  Follow up in 3-6 months.

## 2024-04-03 NOTE — Assessment & Plan Note (Signed)
 Severe.  Lengthy discussion today regarding need for treatment.  She needs a bone builder like Forteo.  She expresses concern regarding cost.  Will reach out to pharmacy to help find the most appropriate and cost effective option.

## 2024-04-03 NOTE — Progress Notes (Signed)
 "  Subjective:  Patient ID: Mary [name], female    DOB: 14-Jun-1945  Age: 79 y.o. MRN: [id number]  CC:   Chief Complaint  Patient presents with   Follow-up    Patient is here for a hospital follow up     HPI:  79 year old female presents for follow-up.  Patient with recent hospital admission subsequent rehab admission for hip fracture.  She has already had a prior hip fracture.  Has severe osteoporosis.  Patient is currently on no treatment for osteoporosis.  She has previously been on bisphosphonate orally and intravenously.  Will discuss treatment options today.  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   Bronchiectasis (HCC) 12/06/2006    Social Hx   Social History   Socioeconomic History   Marital status: Married    Spouse name: Not on file   Number of children: 1   Years of education: Not on file   Highest education level: Not on file  Occupational History   Occupation: sports administrator ( [employer]  Tobacco Use   Smoking status: Never   Smokeless tobacco: Never  Vaping Use   Vaping status: Never Used  Substance and Sexual Activity   Alcohol  use: Not Currently    Comment: Occasional wine with meal   Drug use: No   Sexual activity: Not Currently  Other Topics Concern   Not on file  Social History Narrative   Right Handed   Lives in one story home    Drinks caffeine    Social Drivers of Health   Tobacco Use: Low Risk (01/11/2024)   Patient History    Smoking Tobacco Use: Never    Smokeless Tobacco Use: Never    Passive Exposure: Not on file   Financial Resource Strain: Not on file  Food Insecurity: Not on file  Transportation Needs: Not on file  Physical Activity: Not on file  Stress: Not on file  Social Connections: Not on file  Depression (PHQ2-9): Low Risk (01/11/2024)   Depression (PHQ2-9)    PHQ-2 Score: 4  Alcohol  Screen: Not on file  Housing: Not on file  Utilities: Not on file  Health Literacy: Low Risk (03/18/2024)   Received from [facility]    How often do you need to have someone help you when you read instructions, pamphlets, or other written material from your doctor or pharmacy?: Never    Review of Systems Per HPI  Objective:  BP (!) 147/73 (BP Location: Left Arm, Patient Position: Sitting)   Pulse 97   Temp 97.7 F (36.5 C)   Ht 5' 4.5 (1.638 m)   Wt 100 lb (45.4 kg)   SpO2 93%   BMI 16.90 kg/m      04/01/2024    1:43 PM 01/11/2024    2:32 PM 10/02/2023    2:19 PM  BP/Weight  Systolic BP 147 135 126  Diastolic BP 73 83 68  Wt. (Lbs) 100 102 102  BMI 16.9 kg/m2 17.24 kg/m2 17.24 kg/m2    Physical Exam Vitals and nursing note reviewed.  Constitutional:      General:  She is not in acute distress.    Appearance: Normal appearance.  HENT:     Head: Normocephalic and atraumatic.  Eyes:     General:        Right eye: No discharge.        Left eye: No discharge.     Conjunctiva/sclera: Conjunctivae normal.  Cardiovascular:     Rate and Rhythm: Normal rate and regular rhythm.  Pulmonary:     Effort: Pulmonary effort is normal.     Breath sounds: Normal breath sounds. No wheezing, rhonchi or rales.  Neurological:     Mental Status: She is alert.  Psychiatric:        Mood and Affect: Mood normal.        Behavior: Behavior normal.     Lab Results  Component Value Date   WBC 7.5 05/08/2022   HGB 13.4 05/08/2022   HCT 40.3 05/08/2022   PLT 267 05/08/2022   GLUCOSE 110 (H) 05/08/2022   CHOL 171 01/11/2024   TRIG 55 01/11/2024   HDL 79 01/11/2024    LDLCALC 81 01/11/2024   ALT 18 05/08/2022   AST 29 05/08/2022   NA 138 05/08/2022   K 4.1 05/08/2022   CL 99 05/08/2022   CREATININE 0.62 05/08/2022   BUN 11 05/08/2022   CO2 23 05/08/2022   TSH 1.830 05/08/2022   HGBA1C 5.6 05/08/2022     Assessment & Plan:  Age-related osteoporosis with current pathological fracture, sequela Assessment & Plan: Severe.  Lengthy discussion today regarding need for treatment.  She needs a bone builder like Forteo.  She expresses concern regarding cost.  Will reach out to pharmacy to help find the most appropriate and cost effective option.  Orders: -     AMB Referral VBCI Care Management    Follow-up: 3 to 6 months  Elaine Roanhorse Bluford DO [facility] "

## 2024-04-07 ENCOUNTER — Ambulatory Visit: Admitting: Neurology

## 2024-04-09 ENCOUNTER — Telehealth: Payer: Self-pay

## 2024-04-09 NOTE — Progress Notes (Signed)
 Care Guide Pharmacy Note  04/09/2024 Name: BERNICE MCAULIFFE MRN: [id number] DOB: [birth year]  Referred By: Cook, Jayce G, DO Reason for referral: Complex Care Management (Outreach to schedule with Pharm d )   Mary [name] is a 79 y.o. year old female who is a primary care patient of Cook, Jayce G, DO.  Jasmia Angst Prochnow was referred to the pharmacist for assistance related to: osteoporosis   Successful contact was made with the patient to discuss pharmacy services including being ready for the pharmacist to call at least 5 minutes before the scheduled appointment time and to have medication bottles and any blood pressure readings ready for review. The patient agreed to meet with the pharmacist via telephone visit on (date/time).04/14/2024  Jeoffrey Buffalo , RMA     [facility]  [facility], [facility] Guide  Direct Dial: [phone]  Website: [email or web address]

## 2024-04-14 ENCOUNTER — Other Ambulatory Visit

## 2024-04-14 NOTE — Progress Notes (Unsigned)
" ° °  04/14/2024 Name: Mary [name] MRN: [id number] DOB: [birth year]  No chief complaint on file.   {Visit Type:26650}   Subjective:  Care Team: Primary Care Provider: Cook, Jayce G, DO ; Next Scheduled Visit: *** {careteamprovider:27366}  Medication Access/Adherence  [facility] [place] [place] [address] [place] [address] Phone: [phone] Fax: [phone]   Patient reports affordability concerns with their medications: {YES/NO:21197} Patient reports access/transportation concerns to their pharmacy: {YES/NO:21197} Patient reports adherence concerns with their medications:  {YES/NO:21197} ***   Osteoporosis:  Current medications:  Medications tried in the past: alendronate  in past Fall -->hip replacement Accident, fall had rod in leg Open to forteo and will explore prolia  Current supplements:   Current physical activity: ***  Most recent DEXA: ***  Current medication access support: ***   Objective:  Lab Results  Component Value Date   HGBA1C 5.6 05/08/2022    Lab Results  Component Value Date   CREATININE 0.62 05/08/2022   BUN 11 05/08/2022   NA 138 05/08/2022   K 4.1 05/08/2022   CL 99 05/08/2022   CO2 23 05/08/2022    Lab Results  Component Value Date   CHOL 171 01/11/2024   HDL 79 01/11/2024   LDLCALC 81 01/11/2024   TRIG 55 01/11/2024   CHOLHDL 2.2 01/11/2024    Medications Reviewed Today   Medications were not reviewed in this encounter       Assessment/Plan:   {Pharmacy A/P Choices:26421}  Follow Up Plan: ***  ***  "

## 2024-04-29 ENCOUNTER — Other Ambulatory Visit: Payer: Self-pay | Admitting: Emergency Medicine

## 2024-08-19 ENCOUNTER — Ambulatory Visit: Admitting: Neurology
